# Patient Record
Sex: Female | Born: 1950 | Race: White | Hispanic: No | Marital: Single | Smoking: Former smoker
Health system: Southern US, Community
[De-identification: ages and names within clinical notes are randomized; demographics above are authoritative.]

## PROBLEM LIST (undated history)

## (undated) DIAGNOSIS — M549 Dorsalgia, unspecified: Secondary | ICD-10-CM

## (undated) DIAGNOSIS — E559 Vitamin D deficiency, unspecified: Secondary | ICD-10-CM

## (undated) DIAGNOSIS — C801 Malignant (primary) neoplasm, unspecified: Secondary | ICD-10-CM

## (undated) DIAGNOSIS — F329 Major depressive disorder, single episode, unspecified: Secondary | ICD-10-CM

## (undated) DIAGNOSIS — G8929 Other chronic pain: Secondary | ICD-10-CM

## (undated) DIAGNOSIS — Z9889 Other specified postprocedural states: Secondary | ICD-10-CM

## (undated) DIAGNOSIS — E041 Nontoxic single thyroid nodule: Secondary | ICD-10-CM

## (undated) DIAGNOSIS — R0602 Shortness of breath: Secondary | ICD-10-CM

## (undated) DIAGNOSIS — L570 Actinic keratosis: Secondary | ICD-10-CM

## (undated) DIAGNOSIS — C50919 Malignant neoplasm of unspecified site of unspecified female breast: Secondary | ICD-10-CM

## (undated) DIAGNOSIS — E039 Hypothyroidism, unspecified: Secondary | ICD-10-CM

## (undated) DIAGNOSIS — M2550 Pain in unspecified joint: Secondary | ICD-10-CM

## (undated) DIAGNOSIS — B019 Varicella without complication: Secondary | ICD-10-CM

## (undated) DIAGNOSIS — G43909 Migraine, unspecified, not intractable, without status migrainosus: Secondary | ICD-10-CM

## (undated) DIAGNOSIS — M1990 Unspecified osteoarthritis, unspecified site: Secondary | ICD-10-CM

## (undated) DIAGNOSIS — E049 Nontoxic goiter, unspecified: Secondary | ICD-10-CM

## (undated) DIAGNOSIS — J449 Chronic obstructive pulmonary disease, unspecified: Secondary | ICD-10-CM

## (undated) DIAGNOSIS — T8859XA Other complications of anesthesia, initial encounter: Secondary | ICD-10-CM

## (undated) DIAGNOSIS — F419 Anxiety disorder, unspecified: Secondary | ICD-10-CM

## (undated) DIAGNOSIS — R112 Nausea with vomiting, unspecified: Secondary | ICD-10-CM

## (undated) DIAGNOSIS — R251 Tremor, unspecified: Secondary | ICD-10-CM

## (undated) DIAGNOSIS — E739 Lactose intolerance, unspecified: Secondary | ICD-10-CM

## (undated) DIAGNOSIS — R0600 Dyspnea, unspecified: Secondary | ICD-10-CM

## (undated) DIAGNOSIS — F32A Depression, unspecified: Secondary | ICD-10-CM

## (undated) DIAGNOSIS — I10 Essential (primary) hypertension: Secondary | ICD-10-CM

## (undated) DIAGNOSIS — K219 Gastro-esophageal reflux disease without esophagitis: Secondary | ICD-10-CM

## (undated) DIAGNOSIS — E079 Disorder of thyroid, unspecified: Secondary | ICD-10-CM

## (undated) DIAGNOSIS — K829 Disease of gallbladder, unspecified: Secondary | ICD-10-CM

## (undated) HISTORY — DX: Tremor, unspecified: R25.1

## (undated) HISTORY — DX: Anxiety disorder, unspecified: F41.9

## (undated) HISTORY — DX: Vitamin D deficiency, unspecified: E55.9

## (undated) HISTORY — DX: Major depressive disorder, single episode, unspecified: F32.9

## (undated) HISTORY — PX: CATARACT EXTRACTION: SUR2

## (undated) HISTORY — DX: Lactose intolerance, unspecified: E73.9

## (undated) HISTORY — DX: Hypothyroidism, unspecified: E03.9

## (undated) HISTORY — DX: Migraine, unspecified, not intractable, without status migrainosus: G43.909

## (undated) HISTORY — PX: COLONOSCOPY: SHX174

## (undated) HISTORY — DX: Unspecified osteoarthritis, unspecified site: M19.90

## (undated) HISTORY — DX: Gastro-esophageal reflux disease without esophagitis: K21.9

## (undated) HISTORY — DX: Depression, unspecified: F32.A

## (undated) HISTORY — DX: Malignant (primary) neoplasm, unspecified: C80.1

## (undated) HISTORY — PX: TOE SURGERY: SHX1073

## (undated) HISTORY — DX: Disease of gallbladder, unspecified: K82.9

## (undated) HISTORY — DX: Chronic obstructive pulmonary disease, unspecified: J44.9

## (undated) HISTORY — PX: UPPER GI ENDOSCOPY: SHX6162

## (undated) HISTORY — DX: Varicella without complication: B01.9

## (undated) HISTORY — DX: Dorsalgia, unspecified: M54.9

## (undated) HISTORY — DX: Shortness of breath: R06.02

## (undated) HISTORY — DX: Other chronic pain: G89.29

## (undated) HISTORY — DX: Disorder of thyroid, unspecified: E07.9

## (undated) HISTORY — DX: Essential (primary) hypertension: I10

## (undated) HISTORY — DX: Actinic keratosis: L57.0

## (undated) HISTORY — DX: Pain in unspecified joint: M25.50

## (undated) HISTORY — PX: UPPER GASTROINTESTINAL ENDOSCOPY: SHX188

---

## 1976-08-09 HISTORY — PX: ABDOMINAL HYSTERECTOMY: SHX81

## 1998-08-09 HISTORY — PX: CHOLECYSTECTOMY: SHX55

## 2005-08-09 HISTORY — PX: COLONOSCOPY: SHX174

## 2007-08-10 DIAGNOSIS — C4492 Squamous cell carcinoma of skin, unspecified: Secondary | ICD-10-CM

## 2007-08-10 HISTORY — DX: Squamous cell carcinoma of skin, unspecified: C44.92

## 2015-09-15 DIAGNOSIS — N951 Menopausal and female climacteric states: Secondary | ICD-10-CM | POA: Diagnosis not present

## 2015-09-15 DIAGNOSIS — Z1211 Encounter for screening for malignant neoplasm of colon: Secondary | ICD-10-CM | POA: Diagnosis not present

## 2015-09-15 DIAGNOSIS — Z1231 Encounter for screening mammogram for malignant neoplasm of breast: Secondary | ICD-10-CM | POA: Diagnosis not present

## 2015-09-15 DIAGNOSIS — Z124 Encounter for screening for malignant neoplasm of cervix: Secondary | ICD-10-CM | POA: Diagnosis not present

## 2015-09-15 DIAGNOSIS — Z01419 Encounter for gynecological examination (general) (routine) without abnormal findings: Secondary | ICD-10-CM | POA: Diagnosis not present

## 2015-09-19 DIAGNOSIS — M791 Myalgia: Secondary | ICD-10-CM | POA: Diagnosis not present

## 2015-09-19 DIAGNOSIS — M2550 Pain in unspecified joint: Secondary | ICD-10-CM | POA: Diagnosis not present

## 2015-09-19 DIAGNOSIS — M19042 Primary osteoarthritis, left hand: Secondary | ICD-10-CM | POA: Diagnosis not present

## 2015-09-19 DIAGNOSIS — M47816 Spondylosis without myelopathy or radiculopathy, lumbar region: Secondary | ICD-10-CM | POA: Diagnosis not present

## 2015-09-19 DIAGNOSIS — M19041 Primary osteoarthritis, right hand: Secondary | ICD-10-CM | POA: Diagnosis not present

## 2015-09-19 DIAGNOSIS — M899 Disorder of bone, unspecified: Secondary | ICD-10-CM | POA: Diagnosis not present

## 2015-09-19 DIAGNOSIS — M7711 Lateral epicondylitis, right elbow: Secondary | ICD-10-CM | POA: Diagnosis not present

## 2015-09-19 DIAGNOSIS — M79604 Pain in right leg: Secondary | ICD-10-CM | POA: Diagnosis not present

## 2015-09-19 DIAGNOSIS — M949 Disorder of cartilage, unspecified: Secondary | ICD-10-CM | POA: Diagnosis not present

## 2015-09-19 DIAGNOSIS — M79605 Pain in left leg: Secondary | ICD-10-CM | POA: Diagnosis not present

## 2015-10-06 DIAGNOSIS — H43811 Vitreous degeneration, right eye: Secondary | ICD-10-CM | POA: Diagnosis not present

## 2015-10-06 DIAGNOSIS — H43812 Vitreous degeneration, left eye: Secondary | ICD-10-CM | POA: Diagnosis not present

## 2015-10-06 DIAGNOSIS — H2513 Age-related nuclear cataract, bilateral: Secondary | ICD-10-CM | POA: Diagnosis not present

## 2015-10-22 DIAGNOSIS — M79671 Pain in right foot: Secondary | ICD-10-CM | POA: Diagnosis not present

## 2015-10-22 DIAGNOSIS — M79672 Pain in left foot: Secondary | ICD-10-CM | POA: Diagnosis not present

## 2015-10-22 DIAGNOSIS — R899 Unspecified abnormal finding in specimens from other organs, systems and tissues: Secondary | ICD-10-CM | POA: Diagnosis not present

## 2015-10-22 DIAGNOSIS — M19049 Primary osteoarthritis, unspecified hand: Secondary | ICD-10-CM | POA: Diagnosis not present

## 2015-10-31 DIAGNOSIS — M79671 Pain in right foot: Secondary | ICD-10-CM | POA: Diagnosis not present

## 2015-10-31 DIAGNOSIS — S9031XA Contusion of right foot, initial encounter: Secondary | ICD-10-CM | POA: Diagnosis not present

## 2015-11-04 DIAGNOSIS — S9031XA Contusion of right foot, initial encounter: Secondary | ICD-10-CM | POA: Diagnosis not present

## 2015-11-04 DIAGNOSIS — M84374A Stress fracture, right foot, initial encounter for fracture: Secondary | ICD-10-CM | POA: Diagnosis not present

## 2015-11-04 DIAGNOSIS — R600 Localized edema: Secondary | ICD-10-CM | POA: Diagnosis not present

## 2015-11-06 DIAGNOSIS — M79671 Pain in right foot: Secondary | ICD-10-CM | POA: Diagnosis not present

## 2015-11-06 DIAGNOSIS — S92324A Nondisplaced fracture of second metatarsal bone, right foot, initial encounter for closed fracture: Secondary | ICD-10-CM | POA: Diagnosis not present

## 2015-11-11 DIAGNOSIS — S92324A Nondisplaced fracture of second metatarsal bone, right foot, initial encounter for closed fracture: Secondary | ICD-10-CM | POA: Diagnosis not present

## 2015-11-25 DIAGNOSIS — E049 Nontoxic goiter, unspecified: Secondary | ICD-10-CM | POA: Diagnosis not present

## 2015-11-25 DIAGNOSIS — E785 Hyperlipidemia, unspecified: Secondary | ICD-10-CM | POA: Diagnosis not present

## 2015-11-25 LAB — LIPID PANEL
Cholesterol: 205 mg/dL — AB (ref 0–200)
HDL: 48 mg/dL (ref 35–70)
LDL Cholesterol: 129 mg/dL
Triglycerides: 140 mg/dL (ref 40–160)

## 2015-11-25 LAB — HEPATIC FUNCTION PANEL
ALT: 36 U/L — AB (ref 7–35)
AST: 28 U/L (ref 13–35)
Alkaline Phosphatase: 56 U/L (ref 25–125)
Bilirubin, Total: 0.4 mg/dL

## 2015-11-25 LAB — BASIC METABOLIC PANEL
BUN: 11 mg/dL (ref 4–21)
CREATININE: 0.8 mg/dL (ref 0.5–1.1)
GLUCOSE: 107 mg/dL
Potassium: 4.2 mmol/L (ref 3.4–5.3)
SODIUM: 136 mmol/L — AB (ref 137–147)

## 2015-11-25 LAB — CBC AND DIFFERENTIAL
HEMATOCRIT: 41 % (ref 36–46)
HEMOGLOBIN: 13.7 g/dL (ref 12.0–16.0)
Neutrophils Absolute: 5031 /uL
Platelets: 264 10*3/uL (ref 150–399)
WBC: 7.6 10*3/mL

## 2015-11-27 DIAGNOSIS — Z08 Encounter for follow-up examination after completed treatment for malignant neoplasm: Secondary | ICD-10-CM | POA: Diagnosis not present

## 2015-11-27 DIAGNOSIS — D1801 Hemangioma of skin and subcutaneous tissue: Secondary | ICD-10-CM | POA: Diagnosis not present

## 2015-11-27 DIAGNOSIS — I788 Other diseases of capillaries: Secondary | ICD-10-CM | POA: Diagnosis not present

## 2015-11-27 DIAGNOSIS — L820 Inflamed seborrheic keratosis: Secondary | ICD-10-CM | POA: Diagnosis not present

## 2015-11-27 DIAGNOSIS — L570 Actinic keratosis: Secondary | ICD-10-CM | POA: Diagnosis not present

## 2015-11-27 DIAGNOSIS — L821 Other seborrheic keratosis: Secondary | ICD-10-CM | POA: Diagnosis not present

## 2015-11-27 DIAGNOSIS — L814 Other melanin hyperpigmentation: Secondary | ICD-10-CM | POA: Diagnosis not present

## 2015-11-27 DIAGNOSIS — L718 Other rosacea: Secondary | ICD-10-CM | POA: Diagnosis not present

## 2015-11-27 DIAGNOSIS — D485 Neoplasm of uncertain behavior of skin: Secondary | ICD-10-CM | POA: Diagnosis not present

## 2015-11-27 DIAGNOSIS — Z85828 Personal history of other malignant neoplasm of skin: Secondary | ICD-10-CM | POA: Diagnosis not present

## 2015-12-02 DIAGNOSIS — S92324D Nondisplaced fracture of second metatarsal bone, right foot, subsequent encounter for fracture with routine healing: Secondary | ICD-10-CM | POA: Diagnosis not present

## 2015-12-02 DIAGNOSIS — M2021 Hallux rigidus, right foot: Secondary | ICD-10-CM | POA: Diagnosis not present

## 2016-03-29 ENCOUNTER — Encounter: Payer: Self-pay | Admitting: Family Medicine

## 2016-03-29 DIAGNOSIS — Z85828 Personal history of other malignant neoplasm of skin: Secondary | ICD-10-CM | POA: Insufficient documentation

## 2016-03-29 DIAGNOSIS — M8580 Other specified disorders of bone density and structure, unspecified site: Secondary | ICD-10-CM | POA: Insufficient documentation

## 2016-03-29 DIAGNOSIS — G43009 Migraine without aura, not intractable, without status migrainosus: Secondary | ICD-10-CM | POA: Insufficient documentation

## 2016-03-29 DIAGNOSIS — M549 Dorsalgia, unspecified: Secondary | ICD-10-CM

## 2016-03-29 DIAGNOSIS — E785 Hyperlipidemia, unspecified: Secondary | ICD-10-CM | POA: Insufficient documentation

## 2016-03-29 DIAGNOSIS — G8929 Other chronic pain: Secondary | ICD-10-CM | POA: Insufficient documentation

## 2016-03-29 DIAGNOSIS — G4700 Insomnia, unspecified: Secondary | ICD-10-CM | POA: Insufficient documentation

## 2016-03-29 DIAGNOSIS — E049 Nontoxic goiter, unspecified: Secondary | ICD-10-CM | POA: Insufficient documentation

## 2016-03-29 DIAGNOSIS — F419 Anxiety disorder, unspecified: Secondary | ICD-10-CM | POA: Insufficient documentation

## 2016-03-29 DIAGNOSIS — M159 Polyosteoarthritis, unspecified: Secondary | ICD-10-CM | POA: Insufficient documentation

## 2016-03-30 ENCOUNTER — Ambulatory Visit (INDEPENDENT_AMBULATORY_CARE_PROVIDER_SITE_OTHER): Payer: Medicare Other | Admitting: Family Medicine

## 2016-03-30 ENCOUNTER — Encounter: Payer: Self-pay | Admitting: Family Medicine

## 2016-03-30 VITALS — BP 120/78 | HR 96 | Resp 12 | Ht 62.0 in | Wt 161.0 lb

## 2016-03-30 DIAGNOSIS — F419 Anxiety disorder, unspecified: Secondary | ICD-10-CM | POA: Diagnosis not present

## 2016-03-30 DIAGNOSIS — G8929 Other chronic pain: Secondary | ICD-10-CM

## 2016-03-30 DIAGNOSIS — E049 Nontoxic goiter, unspecified: Secondary | ICD-10-CM | POA: Diagnosis not present

## 2016-03-30 DIAGNOSIS — G4700 Insomnia, unspecified: Secondary | ICD-10-CM | POA: Diagnosis not present

## 2016-03-30 DIAGNOSIS — M2550 Pain in unspecified joint: Secondary | ICD-10-CM | POA: Diagnosis not present

## 2016-03-30 DIAGNOSIS — H8110 Benign paroxysmal vertigo, unspecified ear: Secondary | ICD-10-CM | POA: Diagnosis not present

## 2016-03-30 DIAGNOSIS — M549 Dorsalgia, unspecified: Secondary | ICD-10-CM

## 2016-03-30 DIAGNOSIS — K219 Gastro-esophageal reflux disease without esophagitis: Secondary | ICD-10-CM

## 2016-03-30 DIAGNOSIS — L259 Unspecified contact dermatitis, unspecified cause: Secondary | ICD-10-CM

## 2016-03-30 LAB — T4, FREE: FREE T4: 0.95 ng/dL (ref 0.60–1.60)

## 2016-03-30 LAB — BASIC METABOLIC PANEL
BUN: 15 mg/dL (ref 6–23)
CALCIUM: 8.9 mg/dL (ref 8.4–10.5)
CHLORIDE: 105 meq/L (ref 96–112)
CO2: 27 meq/L (ref 19–32)
Creatinine, Ser: 0.9 mg/dL (ref 0.40–1.20)
GFR: 66.62 mL/min (ref >60.00)
GLUCOSE: 94 mg/dL (ref 70–99)
POTASSIUM: 4.4 meq/L (ref 3.5–5.1)
SODIUM: 139 meq/L (ref 135–145)

## 2016-03-30 LAB — TSH: TSH: 1.68 u[IU]/mL (ref 0.35–4.50)

## 2016-03-30 MED ORDER — ALPRAZOLAM 0.25 MG PO TABS: 0.2500 mg | ORAL_TABLET | Freq: Every day | ORAL | 3 refills | Status: DC

## 2016-03-30 MED ORDER — TRIAMCINOLONE ACETONIDE 0.025 % EX CREA: 1.0000 "application " | TOPICAL_CREAM | Freq: Two times a day (BID) | CUTANEOUS | 1 refills | Status: DC

## 2016-03-30 MED ORDER — MECLIZINE HCL 12.5 MG PO TABS: 12.5000 mg | ORAL_TABLET | Freq: Three times a day (TID) | ORAL | 1 refills | Status: DC | PRN

## 2016-03-30 MED ORDER — CYCLOBENZAPRINE HCL 10 MG PO TABS: 10.0000 mg | ORAL_TABLET | Freq: Every day | ORAL | 1 refills | Status: DC

## 2016-03-30 MED ORDER — OMEPRAZOLE 20 MG PO CPDR: 20.0000 mg | DELAYED_RELEASE_CAPSULE | Freq: Every day | ORAL | 3 refills | Status: DC

## 2016-03-30 MED ORDER — DULOXETINE HCL 30 MG PO CPEP: 30.0000 mg | ORAL_CAPSULE | Freq: Every day | ORAL | 1 refills | Status: DC

## 2016-03-30 MED ORDER — DULOXETINE HCL 30 MG PO CPEP: 30.0000 mg | ORAL_CAPSULE | Freq: Every day | ORAL | 0 refills | Status: DC

## 2016-03-30 NOTE — Progress Notes (Signed)
Pre visit review using our clinic review tool, if applicable. No additional management support is needed unless otherwise documented below in the visit note. 

## 2016-03-30 NOTE — Progress Notes (Signed)
HPI:   [name] is a 64 y.o. female, who is here today to establish care with me.  Former PCP: in [place]. Last preventive routine visit: 11/2015.  She lives alone, she has friends in this area. Independent ADL's and IADL's. No falls in the past year and denies depression symptoms.   Concerns today: medications refill.  Hx of vertigo, according to pt, a few years ago she was evaluated by ENT and after some tests she was reassured and prescribed Meclizine. Has had episodes for the past few days, resolves 20-30 min after taking Meclizine.  Spinning sensation exacerbated by lying on back and turning on left side. She denies hearing loss. + Intermittent, chronic tinnitus, bilateral.  -She is currently on Estradiol patch to treat menopausal symptoms, hot flashes. Pruritic rash on patch site, just on right abdominal site. 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   Anxiety: She has been on Alprazolam 0.25 mg once daily for about 20 years. Medication helps with anxiety and insomnia. She sleeps about 7 hours. No side effects reported. Denies suicidal thoughts. + Hx of depression.  No Hx of psychiatric hospitalizations.  Rosacea on Finacea cream.Hx of skin cancer, BCC and SCC. She states that she does not need referral in order to establish with dermatologists.  Arthralgias: Hx of generalized OA and low back pain. She has seen rheumatologists, currently she is on Diclofenac 50 mg bid and Flexeril 10 mg daily as needed.  No LE numbness,tingling, or weakness. No saddle anesthesia or bowel/urine incontinence.  Thyroid disease/multinodular goiter:  Currently she is on Levothyroxine 12.5 mg daily.Accordign to pt, it was prescribed by endocrinologists for thyroid nodules even though TSH levels were "fine." According to pt, she is supposed to have thyroid u/s every 6  months. Reporting thyroid Bx done in the past.  Tolerating medication well, no side effects reported. She has not noted dysphagia, palpitations, abdominal pain, changes in bowel habits, tremor, cold/heat intolerance, or abnormal weight loss.  GERD: She is currently on Prilosec 20 mg daily. Helps with heartburn.  Denies abdominal pain, nausea, vomiting, changes in bowel habits, blood in stool or melena.  She also takes Triamterene-HCTZ daily as needed for LE edema.   Review of Systems  Constitutional: Negative for activity change, appetite change, fatigue, fever and unexpected weight change.  HENT: Positive for tinnitus (intermittently). Negative for dental problem, hearing loss, mouth sores, nosebleeds and trouble swallowing.   Eyes: Negative for redness and visual disturbance.  Respiratory: Negative for cough, shortness of breath and wheezing.   Cardiovascular: Positive for leg swelling (at baseline.). Negative for chest pain and palpitations.  Gastrointestinal: Negative for abdominal pain, nausea and vomiting.       Negative for changes in bowel habits.  Endocrine: Negative for cold intolerance and heat intolerance.  Genitourinary: Negative for decreased urine volume, difficulty urinating, hematuria, pelvic pain and vaginal bleeding.  Musculoskeletal: Positive for arthralgias and back pain. Negative for gait problem and joint swelling.  Skin: Negative for color change and rash.  Neurological: Positive for dizziness. Negative for seizures, syncope, facial asymmetry, weakness, numbness and headaches.  Psychiatric/Behavioral: Positive for sleep disturbance. Negative for confusion and suicidal ideas. The patient is nervous/anxious.       No current outpatient prescriptions on file prior to visit.   No current facility-administered medications on file prior to visit.  Past Medical History:  Diagnosis Date  . Anxiety   . Arthritis   . Cancer ([place])   . Chicken pox   .  Depression   . GERD (gastroesophageal reflux disease)   . Migraines   . Thyroid disease    Allergies  Allergen Reactions  . Sulfa Antibiotics Rash    Rash all over body/fever    Family History  Problem Relation Age of Onset  . Arthritis Mother   . Diabetes Mother   . Heart disease Father   . Stroke Father   . Hypertension Father   . Hyperlipidemia Father   . Diabetes Father     Social History   Social History  . Marital status: Single    Spouse name: N/A  . Number of children: N/A  . Years of education: N/A   Social History Main Topics  . Smoking status: Former Research scientist (life sciences)  . Smokeless tobacco: Never Used  . Alcohol use Yes     Comment: Occasional  . Drug use: No  . Sexual activity: No   Other Topics Concern  . None   Social History Narrative  . None    Vitals:   03/30/16 0848  BP: 120/78  Pulse: 96  Resp: 12    Body mass index is 29.45 kg/m.      Physical Exam  Nursing note and vitals reviewed. Constitutional: She is oriented to person, place, and time. She appears well-developed. No distress.  HENT:  Head: Atraumatic.  Mouth/Throat: Oropharynx is clear and moist and mucous membranes are normal.  Eyes: Conjunctivae and EOM are normal. Pupils are equal, round, and reactive to light.  Neck: No JVD present. Carotid bruit is not present. Thyromegaly present.  Cardiovascular: Normal rate and regular rhythm.   No murmur heard. Pulses:      Dorsalis pedis pulses are 2+ on the right side, and 2+ on the left side.  Respiratory: Effort normal and breath sounds normal. No respiratory distress.  GI: Soft. She exhibits no mass. There is no hepatomegaly. There is no tenderness.  Musculoskeletal: She exhibits edema (Pitting trace edema LE bilateral).  No tenderness upon palpation of paraspinal muscles. Pain elicited with movement on exam table during examination. Knee with mild pain with ROM, limited flexion, crepitus bilateral. No signs of synovitis or  significant deformities.   Lymphadenopathy:    She has no cervical adenopathy.  Neurological: She is alert and oriented to person, place, and time. She has normal strength. Coordination normal.  SLR negative bilateral. Stable gait with no assistance.  Skin: Skin is warm. Rash noted. There is erythema.     Under estradiol patch mild erythematous macular lesion, no local heat or tenderness.  Psychiatric: Her speech is normal. Her mood appears anxious. Cognition and memory are normal. She exhibits a depressed mood.  Well groomed, good eye contact.      ASSESSMENT AND PLAN:  Lab Results  Component Value Date   TSH 1.68 03/30/2016    Lab Results  Component Value Date   CREATININE 0.90 03/30/2016   BUN 15 03/30/2016   NA 139 03/30/2016   K 4.4 03/30/2016   CL 105 03/30/2016   CO2 27 03/30/2016     Alania was seen today for new patient (initial visit).  Diagnoses and all orders for this visit:  Polyarthralgia-OA   Stable. We discussed some side effects of NSAID's. I think she may benefit from Cymbalta for OA and back pain, she agrees with trying. Decrease Diclofenac from 1  tab bid to once daily. F/U in 6-8 weeks.   -     Basic Metabolic Panel -     Discontinue: DULoxetine (CYMBALTA) 30 MG capsule; Take 1 capsule (30 mg total) by mouth daily. -     DULoxetine (CYMBALTA) 30 MG capsule; Take 1 capsule (30 mg total) by mouth daily.  Goiter, non-toxic  No changes in current management, will follow labs done today and will give further recommendations accordingly.  -     TSH -     T4, Free  Insomnia, unspecified  Good sleep hygiene. No changes in Alprazolam. F/U in 4 months.  -     ALPRAZolam (XANAX) 0.25 MG tablet; Take 1 tablet (0.25 mg total) by mouth at bedtime.  Vertigo, benign positional, unspecified laterality  Some side effects of Meclizine discussed. Vestibular therapy at home can be done as needed,modified [place] maneuvers. Fall precautions. F/U as  needed.  -     Basic Metabolic Panel -     meclizine (ANTIVERT) 12.5 MG tablet; Take 1 tablet (12.5 mg total) by mouth 3 (three) times daily as needed for dizziness.  Anxiety disorder, unspecified  Stable. No changes in Alprazolam, she is requesting 3 months supply, since it is a controlled med + first visit I do not feel comfortable doing so.I may consider in the future. Cymbalta may also help.  F/U in 4 months.   -     ALPRAZolam (XANAX) 0.25 MG tablet; Take 1 tablet (0.25 mg total) by mouth at bedtime. -     DULoxetine (CYMBALTA) 30 MG capsule; Take 1 capsule (30 mg total) by mouth daily.  Back pain, chronic  Some side effects of Flexeril discussed. Cymbalta may help, start 30 mg daily and will plan on increasing it if well tolerated. F/U in 2 months.  -     cyclobenzaprine (FLEXERIL) 10 MG tablet; Take 1 tablet (10 mg total) by mouth daily. -     DULoxetine (CYMBALTA) 30 MG capsule; Take 1 capsule (30 mg total) by mouth daily.  Gastroesophageal reflux disease without esophagitis  Stable. No changes in current management. GERD precautions to continue. F/U in 6-12 months.  -     omeprazole (PRILOSEC) 20 MG capsule; Take 1 capsule (20 mg total) by mouth daily.  Contact dermatitis  Topical steroid cream recommended for up to 2 weeks. Consider applying patch on different areas. F/U as needed.  -     triamcinolone (KENALOG) 0.025 % cream; Apply 1 application topically 2 (two) times daily. For up to 14 days at the time.          Glenroy Crossen G. Martinique, MD  [facility]. [place] office.

## 2016-03-30 NOTE — Patient Instructions (Addendum)
A few things to remember from today's visit:   Goiter, non-toxic - Plan: TSH, T4, Free  Anxiety disorder, unspecified - Plan: DULoxetine (CYMBALTA) 30 MG capsule, ALPRAZolam (XANAX) 0.25 MG tablet  Insomnia, unspecified - Plan: ALPRAZolam (XANAX) 0.25 MG tablet  Polyarthralgia-OA - Plan: Basic Metabolic Panel, DULoxetine (CYMBALTA) 30 MG capsule  Back pain, chronic - Plan: DULoxetine (CYMBALTA) 30 MG capsule, cyclobenzaprine (FLEXERIL) 10 MG tablet  Gastroesophageal reflux disease without esophagitis - Plan: omeprazole (PRILOSEC) 20 MG capsule  Vertigo, benign positional, unspecified laterality - Plan: Basic Metabolic Panel, meclizine (ANTIVERT) 12.5 MG tablet  Cymbalta 30 mg daily added today, he might help with joint pain and back pain.  Medications like diclofenac can increase the risk of gastric bleeding, heart attacks, stroke, kidney disease. Some of the medications you were taking can increase the risk of falls.  -Please schedule appointment with gynecologist and practice in the area, I believe she will need referral for Neither one.   We have ordered labs or studies at this visit.  It can take up to 1-2 weeks for results and processing. IF results require follow up or explanation, we will call you with instructions. Clinically stable results will be released to your [facility]. If you have not heard from [place] or cannot find your results in [facility] in 2 weeks please contact our office at [phone].  If you are not yet signed up for [facility], please consider signing up  Please be sure medication list is accurate. If a new problem present, please set up appointment sooner than planned today.

## 2016-04-23 DIAGNOSIS — Z23 Encounter for immunization: Secondary | ICD-10-CM | POA: Diagnosis not present

## 2016-04-30 DIAGNOSIS — E559 Vitamin D deficiency, unspecified: Secondary | ICD-10-CM | POA: Diagnosis not present

## 2016-04-30 DIAGNOSIS — M84374A Stress fracture, right foot, initial encounter for fracture: Secondary | ICD-10-CM | POA: Diagnosis not present

## 2016-05-03 DIAGNOSIS — E559 Vitamin D deficiency, unspecified: Secondary | ICD-10-CM | POA: Diagnosis not present

## 2016-05-07 DIAGNOSIS — E559 Vitamin D deficiency, unspecified: Secondary | ICD-10-CM | POA: Diagnosis not present

## 2016-05-07 DIAGNOSIS — M84374D Stress fracture, right foot, subsequent encounter for fracture with routine healing: Secondary | ICD-10-CM | POA: Diagnosis not present

## 2016-05-11 DIAGNOSIS — L821 Other seborrheic keratosis: Secondary | ICD-10-CM | POA: Diagnosis not present

## 2016-05-11 DIAGNOSIS — Z85828 Personal history of other malignant neoplasm of skin: Secondary | ICD-10-CM | POA: Diagnosis not present

## 2016-05-11 DIAGNOSIS — L570 Actinic keratosis: Secondary | ICD-10-CM | POA: Diagnosis not present

## 2016-05-11 DIAGNOSIS — L814 Other melanin hyperpigmentation: Secondary | ICD-10-CM | POA: Diagnosis not present

## 2016-05-11 DIAGNOSIS — L820 Inflamed seborrheic keratosis: Secondary | ICD-10-CM | POA: Diagnosis not present

## 2016-05-11 DIAGNOSIS — D1801 Hemangioma of skin and subcutaneous tissue: Secondary | ICD-10-CM | POA: Diagnosis not present

## 2016-05-28 DIAGNOSIS — M84374D Stress fracture, right foot, subsequent encounter for fracture with routine healing: Secondary | ICD-10-CM | POA: Diagnosis not present

## 2016-06-03 ENCOUNTER — Encounter: Payer: Self-pay | Admitting: Family Medicine

## 2016-06-03 ENCOUNTER — Ambulatory Visit (INDEPENDENT_AMBULATORY_CARE_PROVIDER_SITE_OTHER): Payer: Medicare Other | Admitting: Family Medicine

## 2016-06-03 VITALS — BP 140/80 | HR 93 | Resp 12 | Ht 62.0 in | Wt 164.5 lb

## 2016-06-03 DIAGNOSIS — M2550 Pain in unspecified joint: Secondary | ICD-10-CM

## 2016-06-03 DIAGNOSIS — F418 Other specified anxiety disorders: Secondary | ICD-10-CM

## 2016-06-03 DIAGNOSIS — M549 Dorsalgia, unspecified: Secondary | ICD-10-CM

## 2016-06-03 DIAGNOSIS — G8929 Other chronic pain: Secondary | ICD-10-CM

## 2016-06-03 DIAGNOSIS — E049 Nontoxic goiter, unspecified: Secondary | ICD-10-CM | POA: Diagnosis not present

## 2016-06-03 MED ORDER — DULOXETINE HCL 60 MG PO CPEP: 60.0000 mg | ORAL_CAPSULE | Freq: Every day | ORAL | 2 refills | Status: DC

## 2016-06-03 NOTE — Progress Notes (Signed)
HPI:   Kristina [name] is a 65 y.o. female, who is here today to follow on arthralgias and anxiety, last OV 03/30/16.   Hx of generalized OA and low/upper back pain.  Pain is intermittent, mild to moderate, no limitation of ROM in general.  She has seen rheumatologists in the past. Currently she is on Diclofenac 50 mg bid and Flexeril 10 mg daily as needed but takes it almost every night. She denies new associated symptoms.  Pain seems to be worse in the morning, stiffness, improved after movement. IP joint pain also exacerbated by certain activities that involve frequent hand movement, she is right-handed.  No LE numbness,tingling, or weakness. No saddle anesthesia or bowel/urine incontinence.  Last office visit she agrees with starting Cymbalta 30 mg daily, she has tolerated well, denies any side effect. She states that she has not noticed any change in joint pain.  History of anxiety and depression, currently she is on Alprazolam 0.25 mg daily as needed, she usually takes it at bedtime and still helping with his sleep. Occasionally she wakes up in the middle of the night but she is able to go back to sleep.  She denies any worsening of insomnia or depression or after she started Cymbalta. She denies any suicidal thoughts.  Concerns today: Thyroid U/S  Requesting a thyroid ultrasound to follow on "nodules." She has history of goiter and according to patient, she had thyroid ultrasound around December 2016 and was recommended to follow up in a year. She denies odynophagia or dysphagia, last TSH in normal range August 2017.  Lab Results  Component Value Date   TSH 1.68 03/30/2016    Currently she is on Levothyroxine 12.5 g daily.   Review of Systems  Constitutional: Negative for activity change, appetite change, fatigue, fever and unexpected weight change.  HENT: Negative for mouth sores, nosebleeds, trouble swallowing and voice change.   Respiratory: Negative for  cough, shortness of breath, wheezing and stridor.   Cardiovascular: Negative for chest pain, palpitations and leg swelling.  Gastrointestinal: Negative for abdominal pain, nausea and vomiting.       Negative for changes in bowel habits.  Endocrine: Negative for cold intolerance and heat intolerance.  Genitourinary: Negative for difficulty urinating and hematuria.  Musculoskeletal: Positive for arthralgias and back pain. Negative for gait problem and joint swelling.  Neurological: Negative for syncope, weakness, numbness and headaches.  Psychiatric/Behavioral: Negative for confusion and suicidal ideas. The patient is nervous/anxious.       Current Outpatient Prescriptions on File Prior to Visit  Medication Sig Dispense Refill  . ALPRAZolam (XANAX) 0.25 MG tablet Take 1 tablet (0.25 mg total) by mouth at bedtime. 30 tablet 3  . Azelaic Acid (FINACEA) 15 % cream Apply 1 application topically 2 (two) times daily. After skin is thoroughly washed and patted dry, gently but thoroughly massage a thin film of azelaic acid cream into the affected area twice daily, in the morning and evening.    . cyclobenzaprine (FLEXERIL) 10 MG tablet Take 1 tablet (10 mg total) by mouth daily. 90 tablet 1  . diclofenac (VOLTAREN) 50 MG EC tablet Take 2 tablets by mouth daily.    Marland Kitchen estradiol (CLIMARA - DOSED IN MG/24 HR) 0.075 mg/24hr patch Place 1 patch onto the skin once a week.    . levothyroxine (SYNTHROID, LEVOTHROID) 25 MCG tablet Take 0.5 tablets by mouth daily.    . meclizine (ANTIVERT) 12.5 MG tablet Take 1 tablet (12.5 mg total)  by mouth 3 (three) times daily as needed for dizziness. 60 tablet 1  . omeprazole (PRILOSEC) 20 MG capsule Take 1 capsule (20 mg total) by mouth daily. 90 capsule 3  . triamcinolone (KENALOG) 0.025 % cream Apply 1 application topically 2 (two) times daily. For up to 14 days at the time. 30 g 1  . triamterene-hydrochlorothiazide (MAXZIDE-25) 37.5-25 MG tablet Take 1 tablet by mouth  daily.     No current facility-administered medications on file prior to visit.      Past Medical History:  Diagnosis Date  . Anxiety   . Arthritis   . Cancer ([place])   . Chicken pox   . Depression   . GERD (gastroesophageal reflux disease)   . Migraines   . Thyroid disease    Allergies  Allergen Reactions  . Sulfa Antibiotics Rash    Rash all over body/fever    Social History   Social History  . Marital status: Single    Spouse name: N/A  . Number of children: N/A  . Years of education: N/A   Social History Main Topics  . Smoking status: Former Research scientist (life sciences)  . Smokeless tobacco: Never Used  . Alcohol use Yes     Comment: Occasional  . Drug use: No  . Sexual activity: No   Other Topics Concern  . None   Social History Narrative  . None    Vitals:   06/03/16 1052  BP: 140/80  Pulse: 93  Resp: 12   Body mass index is 30.09 kg/m.    Physical Exam  Nursing note and vitals reviewed. Constitutional: She is oriented to person, place, and time. She appears well-developed. No distress.  HENT:  Head: Atraumatic.  Eyes: Conjunctivae and EOM are normal.  Neck: No tracheal tenderness present. Carotid bruit is not present. No tracheal deviation present. Thyromegaly present. No thyroid mass present.  Cardiovascular: Normal rate and regular rhythm.   No murmur heard. DP pulses present bilateral.  Respiratory: Effort normal and breath sounds normal. No respiratory distress.  Musculoskeletal: She exhibits no edema.  No tenderness upon palpation of paraspinal muscles: Cervical, thoracic, and lumbar. Shoulders normal ROM, bilateral, pain is not elicited. Knee no pain with movement, normal ROM,mild crepitus R. Wrist mild limited flexion, IP and MCP normal ROM. No signs of synovitis or significant deformities.   Neurological: She is alert and oriented to person, place, and time. She has normal strength. Coordination and gait normal.  SLR negative bilateral.   Skin:  Skin is warm. No rash noted. No erythema.  Psychiatric: Her speech is normal. Her mood appears anxious. Cognition and memory are normal. She does not exhibit a depressed mood.  Well groomed, good eye contact.      ASSESSMENT AND PLAN:     Geniah was seen today for follow-up.  Diagnoses and all orders for this visit:  Polyarthralgia-OA  Stable overall. She agrees with trying to increase dose of Cymbalta from 30 mg to 60 mg. If in about 8 weeks she does not notice benefit from medication she will let me know thorough my chart so we can start weaning off. Continue Diclofenac, we will review some side effects of medications.  Lab Results  Component Value Date   CREATININE 0.90 03/30/2016   BUN 15 03/30/2016   NA 139 03/30/2016   K 4.4 03/30/2016   CL 105 03/30/2016   CO2 27 03/30/2016    -     DULoxetine (CYMBALTA) 60 MG capsule; Take 1 capsule (  60 mg total) by mouth daily.  Goiter, non-toxic  I do not have report of last thyroid ultrasound. As requested, thyroid ultrasound will be scheduled for December 2017. No changes in levothyroxine dose, TSH can be repeated in August 2018.  -     US THYROID; Future  Chronic bilateral back pain, unspecified back location  Regular low impact exercise recommended. Cymbalta increased from 30 mg to 60 mg. She is not interested in changing Flexeril for another muscle relaxant, Baclofen to be considered later on if she still needs Flexeril daily. We discussed some risk of interaction between Cymbalta and Flexeril, recommended taking Cymbalta in the morning.  -     DULoxetine (CYMBALTA) 60 MG capsule; Take 1 capsule (60 mg total) by mouth daily.  Other specified anxiety disorders  Stable. Cymbalta increased may help. Instructed about warning signs. F/U in 4 months.      -Ms. Maija Susman was advised to return sooner than planned today if new concerns arise.       Betty G. Martinique, MD  [facility]. [place]  office.

## 2016-06-03 NOTE — Patient Instructions (Signed)
A few things to remember from today's visit:   Goiter, non-toxic - Plan: US THYROID  Polyarthralgia-OA - Plan: DULoxetine (CYMBALTA) 60 MG capsule  Chronic bilateral back pain, unspecified back location - Plan: DULoxetine (CYMBALTA) 60 MG capsule  Cymbalta increased to 60 mg, take it in the morning.  Consider changing Flexeril due to risk of interaction. Regular physical activity, low impact, Tai Chi is a good option. Please let me know thorough my chart how you're doing with Cymbalta in about 8 weeks. Please be sure medication list is accurate. If a new problem present, please set up appointment sooner than planned today.

## 2016-06-03 NOTE — Progress Notes (Signed)
Pre visit review using our clinic review tool, if applicable. No additional management support is needed unless otherwise documented below in the visit note. 

## 2016-06-10 ENCOUNTER — Ambulatory Visit
Admission: RE | Admit: 2016-06-10 | Discharge: 2016-06-10 | Disposition: A | Discharge status: Home / Self Care | Payer: Medicare Other | Source: Ambulatory Visit | Attending: Family Medicine | Admitting: Family Medicine

## 2016-06-10 DIAGNOSIS — E042 Nontoxic multinodular goiter: Secondary | ICD-10-CM | POA: Diagnosis not present

## 2016-06-10 DIAGNOSIS — E049 Nontoxic goiter, unspecified: Secondary | ICD-10-CM

## 2016-06-13 ENCOUNTER — Encounter: Payer: Self-pay | Admitting: Family Medicine

## 2016-07-05 DIAGNOSIS — J069 Acute upper respiratory infection, unspecified: Secondary | ICD-10-CM | POA: Diagnosis not present

## 2016-07-05 DIAGNOSIS — J040 Acute laryngitis: Secondary | ICD-10-CM | POA: Diagnosis not present

## 2016-07-05 DIAGNOSIS — R0981 Nasal congestion: Secondary | ICD-10-CM | POA: Diagnosis not present

## 2016-07-05 DIAGNOSIS — R05 Cough: Secondary | ICD-10-CM | POA: Diagnosis not present

## 2016-07-13 DIAGNOSIS — L570 Actinic keratosis: Secondary | ICD-10-CM | POA: Diagnosis not present

## 2016-08-13 DIAGNOSIS — L570 Actinic keratosis: Secondary | ICD-10-CM | POA: Diagnosis not present

## 2016-08-20 ENCOUNTER — Other Ambulatory Visit: Payer: Self-pay | Admitting: Family Medicine

## 2016-08-20 DIAGNOSIS — G8929 Other chronic pain: Secondary | ICD-10-CM

## 2016-08-20 DIAGNOSIS — M549 Dorsalgia, unspecified: Secondary | ICD-10-CM

## 2016-08-20 DIAGNOSIS — G4700 Insomnia, unspecified: Secondary | ICD-10-CM

## 2016-08-20 DIAGNOSIS — M2550 Pain in unspecified joint: Secondary | ICD-10-CM

## 2016-08-20 DIAGNOSIS — K219 Gastro-esophageal reflux disease without esophagitis: Secondary | ICD-10-CM

## 2016-08-20 MED ORDER — DICLOFENAC SODIUM 50 MG PO TBEC: 100.0000 mg | DELAYED_RELEASE_TABLET | Freq: Every day | ORAL | 1 refills | Status: DC

## 2016-08-20 MED ORDER — OMEPRAZOLE 20 MG PO CPDR
20.0000 mg | DELAYED_RELEASE_CAPSULE | Freq: Every day | ORAL | 1 refills | Status: DC
Start: 2016-08-20 — End: 2016-10-05

## 2016-08-20 MED ORDER — LEVOTHYROXINE SODIUM 25 MCG PO TABS: 12.5000 ug | ORAL_TABLET | Freq: Every day | ORAL | 1 refills | Status: DC

## 2016-08-20 MED ORDER — ALPRAZOLAM 0.25 MG PO TABS: 0.2500 mg | ORAL_TABLET | Freq: Every day | ORAL | 1 refills | Status: DC

## 2016-08-20 MED ORDER — CYCLOBENZAPRINE HCL 10 MG PO TABS: 10.0000 mg | ORAL_TABLET | Freq: Every day | ORAL | 1 refills | Status: DC

## 2016-08-20 MED ORDER — DULOXETINE HCL 60 MG PO CPEP: 60.0000 mg | ORAL_CAPSULE | Freq: Every day | ORAL | 1 refills | Status: DC

## 2016-08-20 NOTE — Telephone Encounter (Signed)
Spoke to pt, told her all Rx's were sent to [facility] as requested. Pt verbalized understanding.

## 2016-08-20 NOTE — Telephone Encounter (Signed)
Pt needs new rxs send to [facility] order pharm. Pt needs alprazolam, duloxetine,cyclobenzaprine, diclofenac,levothyroxine and omeprazole #90 w/refills

## 2016-08-30 ENCOUNTER — Telehealth: Payer: Self-pay | Admitting: Family Medicine

## 2016-08-30 NOTE — Telephone Encounter (Signed)
Rx re-faxed.

## 2016-08-30 NOTE — Telephone Encounter (Signed)
Pt stated that [facility] stated they did not receive the Rx for alprazolam can you resend this?

## 2016-09-16 DIAGNOSIS — Z8619 Personal history of other infectious and parasitic diseases: Secondary | ICD-10-CM | POA: Diagnosis not present

## 2016-09-16 DIAGNOSIS — Z01411 Encounter for gynecological examination (general) (routine) with abnormal findings: Secondary | ICD-10-CM | POA: Diagnosis not present

## 2016-09-16 DIAGNOSIS — N952 Postmenopausal atrophic vaginitis: Secondary | ICD-10-CM | POA: Diagnosis not present

## 2016-09-16 DIAGNOSIS — Z90710 Acquired absence of both cervix and uterus: Secondary | ICD-10-CM | POA: Diagnosis not present

## 2016-09-16 DIAGNOSIS — N393 Stress incontinence (female) (male): Secondary | ICD-10-CM | POA: Diagnosis not present

## 2016-09-16 DIAGNOSIS — Z79890 Hormone replacement therapy (postmenopausal): Secondary | ICD-10-CM | POA: Diagnosis not present

## 2016-09-20 ENCOUNTER — Other Ambulatory Visit: Payer: Self-pay | Admitting: Obstetrics & Gynecology

## 2016-09-20 DIAGNOSIS — Z1231 Encounter for screening mammogram for malignant neoplasm of breast: Secondary | ICD-10-CM

## 2016-09-30 ENCOUNTER — Ambulatory Visit
Admission: RE | Admit: 2016-09-30 | Discharge: 2016-09-30 | Disposition: A | Discharge status: Home / Self Care | Payer: Medicare Other | Source: Ambulatory Visit | Attending: Obstetrics & Gynecology | Admitting: Obstetrics & Gynecology

## 2016-09-30 DIAGNOSIS — Z1231 Encounter for screening mammogram for malignant neoplasm of breast: Secondary | ICD-10-CM | POA: Diagnosis not present

## 2016-10-04 NOTE — Progress Notes (Signed)
HPI:   Kristina [name] is a 66 y.o. female, who is here today to follow on some chronic medical problems.  Last seen on 06/03/17.  Generalized osteoarthritis and chronic lower and upper back pain. She is currently on Cymbalta, increased to 60 mg last OV. She is also on Diclofenac 50 mg bid and Flexeril. She has tolerated medication well.  She does not feel like Cymbalta is helping with pain and for the contrary she feels like joint pain is worse: IP and MCP both hands mainly. Shoulders stiffness, no limitation of ROM. Lower back pain, achy,radiated to LLE mainly. Tingling on lateral aspect of distal extremity,noticeable when she rubs her fingers on area. She tells me that she has had this symptoms for a while now and seems to be stable. She states that pain during the day is tolerable but at night can be "intense", interfering with her sleep. No recent trauma. She had epidural injections before and would like to discuss the possibility of having procedure.   She denies saddle anesthesia, urine/bowel incontinence, lower extremity weakness. Cervical pain stable.  Lab Results  Component Value Date   CREATININE 0.90 03/30/2016   BUN 15 03/30/2016   NA 139 03/30/2016   K 4.4 03/30/2016   CL 105 03/30/2016   CO2 27 03/30/2016   She has followed with rheumatologists ion the past.   Anxiety and insomnia:  She is on Alprazolam 0.25 mg at bedtime, helps her sleep. She denies suicidal thoughts or depressed mood.  No major difference in mood with Cymbalta.  Wakes up around 4 am because left hip achy and leg pain, cannot go back to sleep.    Concerns today: Dry cough for 2-3 months.  Symptoms started when she was in [place] in 06/2016 and after URI. She is using nasal saline. Intranasal steroid causes nose bleed,so not using it. Worse at night, wakes her up. She denies associated fever,wheezing,chest pain,dyspnea,or abnormal wt loss.   Hx of GERD, she takes  Omeprazole 20 mg right after dinner. + Heartburn.  Denies abdominal pain, nausea, vomiting, changes in bowel habits, blood in stool or melena.  + Post nasal drainage.  Palpitations: She has had it for a while but for the past months has been worse, sudden "heavy heart bit" and feels like she has to take a deep breath to alleviate discomfort. It happens at rest,usually 2-3 times per day but no daily;it lasts seconds.  No associated chest pain, dyspnea,diaphoresis,or dizziness.  She has not identified exacerbating or alleviating factors for palpitations.    "Shaking" hands noted for about a month ago or so, she is reporting it as new. Exacerbated by fine hand movement: witting or picking something with thumb and index finger. R>L.  She is not sure if tremor is going for longer and just noted it because getting worse. Not aware of FHx of tremor. Right handed. She states that the "bottom jaw" also shakes, this she thought was related to "crunshing" her teeth at night. States that it is usually noted during dentists visits. She tells me that she has "always" have it when she opens mouth for long time. She is concerned about Parkinson, no FHx.  Hx of vertigo, had an episode recently.She is taking Meclizine and takes Maxzide.  She also mentions that she is having hot flashes, trying to stop hormonal therapy, she is on estradiol patch. She follows with gyn.   Review of Systems  Constitutional: Positive for fatigue. Negative for activity  change, appetite change and unexpected weight change.  HENT: Positive for postnasal drip. Negative for mouth sores, sore throat and voice change.   Eyes: Negative for redness and visual disturbance.  Respiratory: Positive for cough. Negative for chest tightness, shortness of breath, wheezing and stridor.   Cardiovascular: Positive for palpitations. Negative for chest pain and leg swelling.  Gastrointestinal: Negative for abdominal pain, nausea and vomiting.         Negative for changes in bowel habits.  Genitourinary: Negative for decreased urine volume, difficulty urinating and hematuria.  Musculoskeletal: Positive for arthralgias, back pain and neck pain. Negative for gait problem.  Skin: Negative for rash.  Allergic/Immunologic: Positive for environmental allergies.  Neurological: Positive for dizziness, tremors and numbness. Negative for syncope, weakness and headaches.  Psychiatric/Behavioral: Positive for sleep disturbance. Negative for confusion, hallucinations and suicidal ideas. The patient is nervous/anxious.       Current Outpatient Prescriptions on File Prior to Visit  Medication Sig Dispense Refill  . ALPRAZolam (XANAX) 0.25 MG tablet Take 1 tablet (0.25 mg total) by mouth at bedtime. 90 tablet 1  . Azelaic Acid (FINACEA) 15 % cream Apply 1 application topically 2 (two) times daily. After skin is thoroughly washed and patted dry, gently but thoroughly massage a thin film of azelaic acid cream into the affected area twice daily, in the morning and evening.    . cyclobenzaprine (FLEXERIL) 10 MG tablet Take 1 tablet (10 mg total) by mouth daily. 90 tablet 1  . diclofenac (VOLTAREN) 50 MG EC tablet Take 2 tablets (100 mg total) by mouth daily. 180 tablet 1  . estradiol (CLIMARA - DOSED IN MG/24 HR) 0.075 mg/24hr patch Place 1 patch onto the skin once a week.    . levothyroxine (SYNTHROID, LEVOTHROID) 25 MCG tablet Take 0.5 tablets (12.5 mcg total) by mouth daily. 45 tablet 1  . meclizine (ANTIVERT) 12.5 MG tablet Take 1 tablet (12.5 mg total) by mouth 3 (three) times daily as needed for dizziness. 60 tablet 1   No current facility-administered medications on file prior to visit.      Past Medical History:  Diagnosis Date  . Anxiety   . Arthritis   . Cancer ([place])   . Chicken pox   . Depression   . GERD (gastroesophageal reflux disease)   . Migraines   . Thyroid disease    Allergies  Allergen Reactions  . Sulfa Antibiotics  Rash    Rash all over body/fever   Family History  Problem Relation Age of Onset  . Arthritis Mother   . Diabetes Mother   . Heart disease Father   . Stroke Father   . Hypertension Father   . Hyperlipidemia Father   . Diabetes Father   . Breast cancer Maternal Aunt     Social History   Social History  . Marital status: Single    Spouse name: N/A  . Number of children: N/A  . Years of education: N/A   Social History Main Topics  . Smoking status: Former Research scientist (life sciences)  . Smokeless tobacco: Never Used  . Alcohol use Yes     Comment: Occasional  . Drug use: No  . Sexual activity: No   Other Topics Concern  . None   Social History Narrative  . None    Vitals:   10/05/16 0825  BP: 140/80  Pulse: 84  Resp: 12  O2 sat 98% at RA. Body mass index is 30.45 kg/m.   Physical Exam  Nursing note and  vitals reviewed. Constitutional: She is oriented to person, place, and time. She appears well-developed. No distress.  HENT:  Head: Atraumatic.  Eyes: Conjunctivae and EOM are normal. Pupils are equal, round, and reactive to light.  Neck: Neck supple.  Cardiovascular: Normal rate and regular rhythm.   No murmur heard. DP pulses present bilateral.  Respiratory: Effort normal and breath sounds normal. No respiratory distress.  GI: Soft. She exhibits no mass. There is no hepatomegaly. There is no tenderness.  Musculoskeletal: She exhibits no edema.  + Tenderness upon palpation of paraspinal muscles: Left lumbar and lower thoracic. + Muscle spasm bilateral.  Knee no pain with movement, normal ROM,mild crepitus bilateral.Hip flexion does not elicit pain, mildly decreased,bilateral. Wrist mild limited flexion, IP and MCP normal ROM. No signs of synovitis.   Lymphadenopathy:    She has no cervical adenopathy.  Neurological: She is alert and oriented to person, place, and time. She has normal strength. She displays tremor (head and hands with intention). No cranial nerve deficit.  Gait normal.  SLR negative bilateral. Romberg test:initially she tilted toward left, able to balance in a few seconds ? Cogwheel. Pronator drift negative otherwise, right hand rotated about 30 degree.   Skin: Skin is warm. No rash noted. No erythema.  Psychiatric: Her speech is normal. Her mood appears anxious. Her affect is labile. Cognition and memory are normal. She expresses no suicidal ideation.  Well groomed, good eye contact.      ASSESSMENT AND PLAN:   Lizandra was seen today for follow-up.  Diagnoses and all orders for this visit:  Polyarthralgia-OA  OA, we discussed some side effects of Diclofenac, including GI and CVD. Cymbalta did not help,so discontinued. Mobic caused skin irritation in the past,so she is afraid of trying Celebrex.  Chronic bilateral back pain, unspecified back location  She agrees with trying Gabapentin at bedtime, some side effects discussed. Ortho referral placed.  -     Ambulatory referral to Orthopedic Surgery -     Discontinue: gabapentin (NEURONTIN) 300 MG capsule; Take 1 capsule (300 mg total) by mouth at bedtime. -     gabapentin (NEURONTIN) 300 MG capsule; Take 1 capsule (300 mg total) by mouth at bedtime.  Other specified anxiety disorders  + Depression. Cymbalta did not help, so discontinued.Effexor added. Explained that usually well tolerated changing from Cymbalta to Effexor but if withdrawal like symptoms, we may nee to wean off Cymbalta first. Instructed about warning signs. No changes in Alprazolam. F/U in 6 weeks.  -     venlafaxine XR (EFFEXOR XR) 75 MG 24 hr capsule; Take 1 capsule (75 mg total) by mouth daily with breakfast.  Gastroesophageal reflux disease without esophagitis  GERD precautions discussed. Could be causing cough. Increase Omeprazole from 20 to 40 mg daily and 30 min before or 3 hours after a meal. F/U in 6 weeks.   -     omeprazole (PRILOSEC) 40 MG capsule; Take 1 capsule (40 mg total) by mouth  daily.  Tremor of unknown origin  Possible etiologies discussed. Because on examination I am not sure about mild abnormalities (cogwheel,romberg,pronator drift),neurology referral placed. Instructed about warning signs.  -     Ambulatory referral to Neurology  Cough  ? Allergies,GERD,residual symptoms after URI among some,COPD. CXR ordered. PPI increased. F/U in 6 weeks.  -     DG Chest 2 View; Future  Heart palpitations  EKG done today: SR, mild short PR interval,normal axis. No other EKG available for comparison. Instructed about  warning signs. F/U in 6 weeks.  -     EKG 12-Lead -     Basic metabolic panel  Hot flashes, menopausal  Effexor may help. Continue following with gyn.     Face to face: 44 min. She has several concerns today, I am not sure if all were addressed today,she was going through a list writing on paper. > 50% of visit was dedicated to discussion of possible etiologies for some of her concerns,prognosis of others,medication side effects, and plan of care.    -Ms. Kymberlee Machovec was advised to return sooner than planned today if new concerns arise.       Cing [place] G. Martinique, MD  [facility] [place] office.

## 2016-10-05 ENCOUNTER — Telehealth: Payer: Self-pay

## 2016-10-05 ENCOUNTER — Encounter: Payer: Self-pay | Admitting: Family Medicine

## 2016-10-05 ENCOUNTER — Ambulatory Visit (INDEPENDENT_AMBULATORY_CARE_PROVIDER_SITE_OTHER): Payer: Medicare Other | Admitting: Family Medicine

## 2016-10-05 VITALS — BP 140/80 | HR 84 | Resp 12 | Ht 62.0 in | Wt 166.5 lb

## 2016-10-05 DIAGNOSIS — F418 Other specified anxiety disorders: Secondary | ICD-10-CM

## 2016-10-05 DIAGNOSIS — G8929 Other chronic pain: Secondary | ICD-10-CM | POA: Diagnosis not present

## 2016-10-05 DIAGNOSIS — M549 Dorsalgia, unspecified: Secondary | ICD-10-CM | POA: Diagnosis not present

## 2016-10-05 DIAGNOSIS — N951 Menopausal and female climacteric states: Secondary | ICD-10-CM

## 2016-10-05 DIAGNOSIS — M2550 Pain in unspecified joint: Secondary | ICD-10-CM | POA: Diagnosis not present

## 2016-10-05 DIAGNOSIS — R251 Tremor, unspecified: Secondary | ICD-10-CM | POA: Diagnosis not present

## 2016-10-05 DIAGNOSIS — K219 Gastro-esophageal reflux disease without esophagitis: Secondary | ICD-10-CM | POA: Diagnosis not present

## 2016-10-05 DIAGNOSIS — R002 Palpitations: Secondary | ICD-10-CM | POA: Diagnosis not present

## 2016-10-05 DIAGNOSIS — R059 Cough, unspecified: Secondary | ICD-10-CM

## 2016-10-05 DIAGNOSIS — R05 Cough: Secondary | ICD-10-CM

## 2016-10-05 LAB — BASIC METABOLIC PANEL
BUN: 17 mg/dL (ref 6–23)
CALCIUM: 8.9 mg/dL (ref 8.4–10.5)
CO2: 30 meq/L (ref 19–32)
Chloride: 104 mEq/L (ref 96–112)
Creatinine, Ser: 0.87 mg/dL (ref 0.40–1.20)
GFR: 69.16 mL/min (ref >60.00)
Glucose, Bld: 91 mg/dL (ref 70–99)
Potassium: 4 mEq/L (ref 3.5–5.1)
Sodium: 139 mEq/L (ref 135–145)

## 2016-10-05 MED ORDER — GABAPENTIN 300 MG PO CAPS: 300.0000 mg | ORAL_CAPSULE | Freq: Every day | ORAL | 3 refills | Status: DC

## 2016-10-05 MED ORDER — OMEPRAZOLE 40 MG PO CPDR: 40.0000 mg | DELAYED_RELEASE_CAPSULE | Freq: Every day | ORAL | 3 refills | Status: DC

## 2016-10-05 MED ORDER — VENLAFAXINE HCL ER 75 MG PO CP24: 75.0000 mg | ORAL_CAPSULE | Freq: Every day | ORAL | 2 refills | Status: DC

## 2016-10-05 NOTE — Patient Instructions (Addendum)
A few things to remember from today's visit:   Polyarthralgia-OA  Chronic bilateral back pain, unspecified back location - Plan: Ambulatory referral to Orthopedic Surgery, gabapentin (NEURONTIN) 300 MG capsule  Other specified anxiety disorders - Plan: venlafaxine XR (EFFEXOR XR) 75 MG 24 hr capsule  Gastroesophageal reflux disease without esophagitis  Tremor of unknown origin - Plan: Ambulatory referral to Neurology  Cough - Plan: DG Chest 2 View  Heart palpitations - Plan: EKG 12-Lead  Hot flashes, menopausal   Effexor to start and Cymbalta to stop. In 2 weeks start Gabapentin for left leg pain. Omeprazole increased to 20 mg.    Avoid foods that make your symptoms worse, for example coffee, chocolate,pepermeint,alcohol, and greasy food. Raising the head of your bed about 6 inches may help with nocturnal symptoms.  Avoid tobacco use. Weight loss (if you are overweight). Avoid lying down for 3 hours after eating.  Instead 3 large meals daily try small and more frequent meals during the day.  Every medication have side effects and medications for GERD are not the exception.At this time I think benefit is greater than risk.  There has been some concerns about dementia and medications like Omeprazole or Nexium (PPI) but recent studies do not show a relation. Also kidney function can be affected among some patients that take these type of medications, we will follow accordingly. Taking these medications for long term could increase risk of osteoporosis (some debate now), vitamin deficiencies (Vit D and B12 specialty), increases risk of pneumonia.  You should be evaluated immediately if bloody vomiting, bloody stools, black stools (like tar), difficulty swallowing, food gets stuck on the way down or choking when eating. Abnormal weight loss or severe abdominal pain.  If symptoms are not resolved sometimes endoscopy is necessary.  Please be sure medication list is accurate. If  a new problem present, please set up appointment sooner than planned today.

## 2016-10-05 NOTE — Telephone Encounter (Signed)
Received PA request from [facility] for Venlafaxine er 75 mg capsules. PA submitted & is pending. Key: [id number]

## 2016-10-05 NOTE — Progress Notes (Signed)
Pre visit review using our clinic review tool, if applicable. No additional management support is needed unless otherwise documented below in the visit note. 

## 2016-10-06 NOTE — Telephone Encounter (Signed)
PA Approved, form faxed back to pharmacy. 

## 2016-10-07 ENCOUNTER — Ambulatory Visit (INDEPENDENT_AMBULATORY_CARE_PROVIDER_SITE_OTHER)
Admission: RE | Admit: 2016-10-07 | Discharge: 2016-10-07 | Disposition: A | Discharge status: Home / Self Care | Payer: Medicare Other | Source: Ambulatory Visit | Attending: Family Medicine | Admitting: Family Medicine

## 2016-10-07 ENCOUNTER — Encounter: Payer: Self-pay | Admitting: Family Medicine

## 2016-10-07 DIAGNOSIS — R05 Cough: Secondary | ICD-10-CM

## 2016-10-07 DIAGNOSIS — R059 Cough, unspecified: Secondary | ICD-10-CM

## 2016-10-19 ENCOUNTER — Telehealth: Payer: Self-pay | Admitting: Family Medicine

## 2016-10-19 NOTE — Telephone Encounter (Signed)
Pt states she has had vertigo since Friday.  Pt is already prescripted  meclizine (ANTIVERT) 12.5 MG tablet  Pt states it has been going on since last Friday, and has never lasted this long. Would like to know if yo want to refer her to an ENT of have her come in?  Pt states she also has a feeling of "hot flashes" with this and clammy.  Pt has neuro appt on 3/26

## 2016-10-20 ENCOUNTER — Other Ambulatory Visit: Payer: Self-pay | Admitting: Family Medicine

## 2016-10-20 DIAGNOSIS — R42 Dizziness and giddiness: Secondary | ICD-10-CM

## 2016-10-20 NOTE — Telephone Encounter (Signed)
Noted  

## 2016-10-20 NOTE — Telephone Encounter (Signed)
Left voicemail letting patient know referral was placed & to call back with any questions.

## 2016-10-20 NOTE — Telephone Encounter (Signed)
Referral to ENT placed. If symptoms get worse,or if sudden hearing loss,chest pain,associated palpitation,dyspnea or other worrisome/new associated symptom she needs to be evaluated;otherwise she can wait for ENT evaluation.  Thanks, BJ

## 2016-10-20 NOTE — Telephone Encounter (Signed)
Pt states she no longer needs this referral to the ENT. Pt states she did not realize she needed to "ween" herself off   of the DULoxetine (CYMBALTA) 60 MG capsule   Pt had quit completely cold [place].  Pt realized this, took one yesterday, and today feels fine today. Pt states she is still going to dc this med, but is going to do it gradually, probably one every other day.  If pt has any other issues, she will let us know.  Sent message to Hilda Blades to disregard request for ENT referral.

## 2016-10-29 NOTE — Progress Notes (Signed)
Kristina [name] was seen today in the movement disorders clinic for neurologic consultation at the request of [name], MD.  The consultation is for the evaluation of tremor.   Tremor: Yes.     How long has it been going on? 10 years but intermittent then but persistent x years  At rest or with activation?  With use   When is it noted the most?  Fine motor coordination  Fam hx of tremor?  No.  Located where?  R noted more but both hands probably equal  Affected by caffeine:  Doesn't notice (1 cup tea)  Affected by alcohol:  Not sure  Affected by stress:  unsure  Affected by fatigue:  No.  Spills soup if on spoon:  No. but will notice it  Spills glass of liquid if full:  No. but will note it  Affects ADL's (tying shoes, brushing teeth, etc):  No.   OtherSpecific Symptoms:  Voice: no change Sleep: sleeps well with xanax and flexeril and diclofenac  Vivid Dreams:  Yes.    Acting out dreams:  Yes.   (will reach out in the middle of the night but not bad) Wet Pillows: No. Postural symptoms:  No.  Falls?  No. Bradykinesia symptoms: minimal trouble getting up due to back pain Loss of smell:  No. Loss of taste:  No. Urinary Incontinence:  No. Difficulty Swallowing:  Mild (always had trouble swallowing pills) Handwriting, micrographia: No. Trouble with ADL's:  No.  Trouble buttoning clothing: No. Depression:  No. Memory changes:  Yes.   N/V:  No. Lightheaded:  Yes.   (was taking meclizine but "I have a problem with my ear")  Syncope: No. Diplopia:  No. Dyskinesia:  No.  Neuroimaging has not previously been performed in the recent years.  Believes she had one "for the ear" 10 years ago  PREVIOUS MEDICATIONS: none to date  ALLERGIES:   Allergies  Allergen Reactions  . Meloxicam   . Sulfa Antibiotics Rash    Rash all over body/fever    CURRENT MEDICATIONS:  Outpatient Encounter Prescriptions as of 11/01/2016  Medication Sig  . ALPRAZolam (XANAX) 0.25 MG tablet Take  1 tablet (0.25 mg total) by mouth at bedtime.  . Azelaic Acid (FINACEA) 15 % cream Apply 1 application topically 2 (two) times daily. After skin is thoroughly washed and patted dry, gently but thoroughly massage a thin film of azelaic acid cream into the affected area twice daily, in the morning and evening.  . cyclobenzaprine (FLEXERIL) 10 MG tablet Take 1 tablet (10 mg total) by mouth daily.  . diclofenac (VOLTAREN) 50 MG EC tablet Take 2 tablets (100 mg total) by mouth daily.  . DULoxetine (CYMBALTA) 60 MG capsule Take 60 mg by mouth daily.  Marland Kitchen estradiol (CLIMARA - DOSED IN MG/24 HR) 0.075 mg/24hr patch Place 1 patch onto the skin once a week.  . levothyroxine (SYNTHROID, LEVOTHROID) 25 MCG tablet Take 0.5 tablets (12.5 mcg total) by mouth daily.  . Magnesium 250 MG TABS Take by mouth daily.  . meclizine (ANTIVERT) 12.5 MG tablet Take 1 tablet (12.5 mg total) by mouth 3 (three) times daily as needed for dizziness.  . Multiple Vitamin (MULTIVITAMIN) tablet Take 1 tablet by mouth daily.  Marland Kitchen omeprazole (PRILOSEC) 40 MG capsule Take 1 capsule (40 mg total) by mouth daily.  . vitamin B-12 (CYANOCOBALAMIN) 1000 MCG tablet Take 1,000 mcg by mouth once a week.  . [DISCONTINUED] gabapentin (NEURONTIN) 300 MG capsule Take 1 capsule (  300 mg total) by mouth at bedtime.  . [DISCONTINUED] venlafaxine XR (EFFEXOR XR) 75 MG 24 hr capsule Take 1 capsule (75 mg total) by mouth daily with breakfast.   No facility-administered encounter medications on file as of 11/01/2016.     PAST MEDICAL HISTORY:   Past Medical History:  Diagnosis Date  . Anxiety   . Arthritis   . Cancer ([place])   . Chicken pox   . Depression   . GERD (gastroesophageal reflux disease)   . Migraines   . Thyroid disease     PAST SURGICAL HISTORY:   Past Surgical History:  Procedure Laterality Date  . ABDOMINAL HYSTERECTOMY    . CHOLECYSTECTOMY      SOCIAL HISTORY:   Social History   Social History  . Marital status: Single     Spouse name: N/A  . Number of children: N/A  . Years of education: N/A   Occupational History  . Not on file.   Social History Main Topics  . Smoking status: Former Smoker    Quit date: 11/02/1990  . Smokeless tobacco: Never Used  . Alcohol use Yes     Comment: glass of wine once a month  . Drug use: No  . Sexual activity: No   Other Topics Concern  . Not on file   Social History Narrative  . No narrative on file    FAMILY HISTORY:   Family Status  Relation Status  . Mother Deceased  . Father Deceased  . Maternal Aunt   . Sister Alive  . Brother Alive    ROS:  A complete 10 system review of systems was obtained and was unremarkable apart from what is mentioned above.  PHYSICAL EXAMINATION:    VITALS:   Vitals:   11/01/16 0859  BP: 128/80  Pulse: (!) 112  Weight: 166 lb (75.3 kg)  Height: 5\' 2"  (1.575 m)    GEN:  The patient appears stated age and is in NAD. HEENT:  Normocephalic, atraumatic.  The mucous membranes are moist. The superficial temporal arteries are without ropiness or tenderness. CV:  RRR Lungs:  CTAB Neck/HEME:  There are no carotid bruits bilaterally.  Neurological examination:  Orientation: The patient is alert and oriented x3. Fund of knowledge is appropriate.  Recent and remote memory are intact.  Attention and concentration are normal.    Able to name objects and repeat phrases. Cranial nerves: There is good facial symmetry. The L pupil is slightly irregular and reactive.  The right is regular and reactive.   Fundoscopic exam reveals clear margins bilaterally. Extraocular muscles are intact. The visual fields are full to confrontational testing. The speech is fluent and clear. Soft palate rises symmetrically and there is no tongue deviation. Hearing is intact to conversational tone. Sensation: Sensation is intact to light and pinprick throughout (facial, trunk, extremities). Vibration is intact at the bilateral big toe. There is no extinction  with double simultaneous stimulation. There is no sensory dermatomal level identified. Motor: Strength is 5/5 in the bilateral upper and lower extremities.   Shoulder shrug is equal and symmetric.  There is no pronator drift. Deep tendon reflexes: Deep tendon reflexes are 2/4 at the bilateral biceps, triceps, brachioradialis, patella and achilles. Plantar responses are downgoing bilaterally.  Movement examination: Tone: There is normal tone in the bilateral upper extremities.  The tone in the lower extremities is normal.  Abnormal movements: she has minimal difficulty with archimedes spirals.  she has no difficulty when asked to  pour a full glass of water from one glass to another. Coordination:  There is no decremation with RAM's, with any form of RAMS, including alternating supination and pronation of the forearm, hand opening and closing, finger taps, heel taps and toe taps. Gait and Station: The patient has no difficulty arising out of a deep-seated chair without the use of the hands. The patient's stride length is normal.  Able to ambulate in a tandem fashion.    LABS  Lab Results  Component Value Date   TSH 1.68 03/30/2016      Chemistry      Component Value Date/Time   NA 139 10/05/2016 0947   NA 136 (A) 11/25/2015   K 4.0 10/05/2016 0947   CL 104 10/05/2016 0947   CO2 30 10/05/2016 0947   BUN 17 10/05/2016 0947   BUN 11 11/25/2015   CREATININE 0.87 10/05/2016 0947   GLU 107 11/25/2015      Component Value Date/Time   CALCIUM 8.9 10/05/2016 0947   ALKPHOS 56 11/25/2015   AST 28 11/25/2015   ALT 36 (A) 11/25/2015       ASSESSMENT/PLAN:  1.  Tremor, likely mild essential tremor  -This is evidenced by the symmetrical nature and longstanding hx of gradually getting worse.  We discussed nature and pathophysiology.  We discussed that this can continue to gradually get worse with time.  We discussed that some medications can worsen this, as can caffeine use.  Told her that I  have seen cymbalta cause tremor and cymbalta was just started in August, but she apparently has been on it in the past without issue.  She just wanted to make sure that nothing else was wrong.  We discussed medication therapy as well as surgical therapy.  Ultimately, the patient decided to hold on any med.  If changes mind, beta blocker could be of value as she was tachycardic on arrival.  Told to call if new issues arise or if it gets worse.  Much greater than 50% of this visit was spent in counseling and coordinating care.  Total face to face time:  45 min .     Cc:  [name], MD

## 2016-11-01 ENCOUNTER — Ambulatory Visit (INDEPENDENT_AMBULATORY_CARE_PROVIDER_SITE_OTHER): Payer: Medicare Other | Admitting: Neurology

## 2016-11-01 ENCOUNTER — Encounter: Payer: Self-pay | Admitting: Neurology

## 2016-11-01 VITALS — BP 128/80 | HR 92 | Ht 62.0 in | Wt 166.0 lb

## 2016-11-01 DIAGNOSIS — G250 Essential tremor: Secondary | ICD-10-CM | POA: Diagnosis not present

## 2016-11-02 ENCOUNTER — Encounter (INDEPENDENT_AMBULATORY_CARE_PROVIDER_SITE_OTHER): Payer: Self-pay | Admitting: Orthopaedic Surgery

## 2016-11-02 ENCOUNTER — Ambulatory Visit (INDEPENDENT_AMBULATORY_CARE_PROVIDER_SITE_OTHER): Payer: Medicare Other

## 2016-11-02 ENCOUNTER — Ambulatory Visit (INDEPENDENT_AMBULATORY_CARE_PROVIDER_SITE_OTHER): Payer: Medicare Other | Admitting: Orthopaedic Surgery

## 2016-11-02 VITALS — BP 138/88 | HR 87 | Ht 62.0 in | Wt 166.0 lb

## 2016-11-02 DIAGNOSIS — G8929 Other chronic pain: Secondary | ICD-10-CM

## 2016-11-02 DIAGNOSIS — M9983 Other biomechanical lesions of lumbar region: Secondary | ICD-10-CM | POA: Diagnosis not present

## 2016-11-02 DIAGNOSIS — M5442 Lumbago with sciatica, left side: Secondary | ICD-10-CM

## 2016-11-02 DIAGNOSIS — M5441 Lumbago with sciatica, right side: Secondary | ICD-10-CM | POA: Diagnosis not present

## 2016-11-02 DIAGNOSIS — M48061 Spinal stenosis, lumbar region without neurogenic claudication: Secondary | ICD-10-CM

## 2016-11-02 DIAGNOSIS — M7062 Trochanteric bursitis, left hip: Secondary | ICD-10-CM | POA: Diagnosis not present

## 2016-11-02 MED ORDER — LIDOCAINE HCL 1 % IJ SOLN
0.5000 mL | INTRAMUSCULAR | Status: AC | PRN
  Administered 2016-11-02: .5 mL

## 2016-11-02 MED ORDER — BUPIVACAINE HCL 0.25 % IJ SOLN
2.0000 mL | INTRAMUSCULAR | Status: AC | PRN
  Administered 2016-11-02: 2 mL via INTRA_ARTICULAR

## 2016-11-02 MED ORDER — METHYLPREDNISOLONE ACETATE 40 MG/ML IJ SUSP
40.0000 mg | INTRAMUSCULAR | Status: AC | PRN
  Administered 2016-11-02: 40 mg via INTRA_ARTICULAR

## 2016-11-02 NOTE — Progress Notes (Signed)
Office Visit Note   Patient: Kristina [name]           Date of Birth: 03-03-1951           MRN: [id number] Visit Date: 11/02/2016              Requested by: Betty G Martinique, MD [address] [place], [place] [address] PCP: [name], MD   Assessment & Plan: Visit Diagnoses:  1. Chronic bilateral low back pain with bilateral sciatica   2. Foraminal stenosis of lumbar region   3. Trochanteric bursitis, left hip     Plan: Left greater trochanter Injection performed.  We  discussed pathophysiology of foraminal stenosis. She can call she's having persistent problems. Hopefully with the injection should get some relief. We discussed options including epidural versus repeat MRI scan if her symptoms persist and progress. Her pain tends to wax and wane recently has been more severe.  Follow-Up Instructions: Return if symptoms worsen or fail to improve.   Orders:  Orders Placed This Encounter  Procedures  . Large Joint Injection/Arthrocentesis  . XR Lumbar Spine 2-3 Views   No orders of the defined types were placed in this encounter.     Procedures: Large Joint Inj Date/Time: 11/02/2016 12:06 PM Performed by: Marybelle Killings Authorized by: Marybelle Killings   Consent Given by:  Patient Location:  Hip Site:  L greater trochanter Ultrasound Guidance: No   Fluoroscopic Guidance: No   Arthrogram: No   Medications:  2 mL bupivacaine 0.25 %; 0.5 mL lidocaine 1 %; 40 mg methylPREDNISolone acetate 40 MG/ML      Clinical Data: No additional findings.   Subjective: Chief Complaint  Patient presents with  . Lower Back - Pain    Patient presents with chronic low back pain. She had a MRI in 2015 and has had ESI's since then, the last in April 2016. She has pain into her left hip and groin. The pain radiates down the back of her left thigh to her knee. She states that she does experience some tingling in her left calf. She has some symptoms on the right, but the left is worse.  She had relief from the epidural injections in the beginning, but they do not help that much anymore. She is taking diclofenac and flexeril at night. She cannot take pain medications because they make her sick.     Review of Systems 14 point   systems performed. Pus for history of migraines hyperlipidemia history of basal cell carcinoma. Back pain previous MRI showing by foraminal moderate stenosis L4-5 L5-S1. History goiter, vertigo GERD. Negative for  MI& stroke. Positive history for previous epidural.   Objective: Vital Signs: BP 138/88   Pulse 87   Ht 5\' 2"  (1.575 m)   Wt 166 lb (75.3 kg)   LMP  (LMP Unknown)   BMI 30.36 kg/m   Physical Exam  Constitutional: She is oriented to person, place, and time. She appears well-developed.  HENT:  Head: Normocephalic.  Right Ear: External ear normal.  Left Ear: External ear normal.  Eyes: Pupils are equal, round, and reactive to light.  Neck: No tracheal deviation present. No thyromegaly present.  Cardiovascular: Normal rate.   Pulmonary/Chest: Effort normal.  Abdominal: Soft.  Musculoskeletal:  The patient has normal heel toe gait. No weakness gastrocsoleus anterior tib. I have flexion weakness. She was tenderness over the left greater than right trochanter. Some sciatic notch tenderness tenderness to palpation lumbosacral junction. L4-L5 spinous  process is tender. For flexion and extension with mild discomfort.  Neurological: She is alert and oriented to person, place, and time.  Skin: Skin is warm and dry.  Psychiatric: She has a normal mood and affect. Her behavior is normal.    Ortho Exam  Specialty Comments:  No specialty comments available.  Imaging: MRI report from [place] reviewed from 2015. This shows moderate by foraminal stenosis at L4-5 and also L5-S1. No central stenosis. There was loss of disc space height.   PMFS History: Patient Active Problem List   Diagnosis Date Noted  . GERD (gastroesophageal reflux  disease) 03/30/2016  . Vertigo, benign positional 03/30/2016  . 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 Anxiety disorder 03/29/2016  . Insomnia, unspecified 03/29/2016  . Osteopenia 03/29/2016  . Migraine headache without aura 03/29/2016  . Hyperlipidemia 03/29/2016  . History of basal cell carcinoma 03/29/2016  . Polyarthralgia-OA 03/29/2016  . Back pain, chronic 03/29/2016   Past Medical History:  Diagnosis Date  . Anxiety   . Arthritis   . Cancer ([place])    basal and squamous cell skin  . Chicken pox   . Depression   . GERD (gastroesophageal reflux disease)   . Migraines   . Thyroid disease     Family History  Problem Relation Age of Onset  . Arthritis Mother   . Diabetes Mother   . Heart disease Father   . Stroke Father   . Hypertension Father   . Hyperlipidemia Father   . Diabetes Father   . Breast cancer Maternal Aunt   . Throat cancer Brother     Past Surgical History:  Procedure Laterality Date  . ABDOMINAL HYSTERECTOMY    . CHOLECYSTECTOMY     Social History   Occupational History  . retired     administration   Social History Main Topics  . Smoking status: Former Smoker    Quit date: 11/02/1990  . Smokeless tobacco: Never Used  . Alcohol use Yes     Comment: glass of wine once a month  . Drug use: No  . Sexual activity: No

## 2016-11-11 DIAGNOSIS — D1801 Hemangioma of skin and subcutaneous tissue: Secondary | ICD-10-CM | POA: Diagnosis not present

## 2016-11-11 DIAGNOSIS — L814 Other melanin hyperpigmentation: Secondary | ICD-10-CM | POA: Diagnosis not present

## 2016-11-11 DIAGNOSIS — Z85828 Personal history of other malignant neoplasm of skin: Secondary | ICD-10-CM | POA: Diagnosis not present

## 2016-11-11 DIAGNOSIS — L821 Other seborrheic keratosis: Secondary | ICD-10-CM | POA: Diagnosis not present

## 2016-11-11 DIAGNOSIS — L820 Inflamed seborrheic keratosis: Secondary | ICD-10-CM | POA: Diagnosis not present

## 2016-11-15 NOTE — Progress Notes (Addendum)
HPI:   [name] is a 66 y.o. female, who is here today to follow on some chronic medical problems we addressed last OV.  She was last seen on 10/05/16.concerns  Since her last OV she has seen neurologists,Dr Tat, for tremor. She has also seen ortho, Dr Lorin Mercy.  Anxiety: Last OV Cymbalta was changed to Effexor but she did not start Effexor,she was afraid of having any side effect. She forgot to take Cymbalta for a few days and "got sick", felt better when she resumed it.  She is also on Alprazolam 0.25 mg,which she has taken for years.  She was also c/o palpitations, which she has had for a while, she has not had any since her last OV.  She is back to estrogen patch for hot flashes,follows with gyn.   GERD:  Last OV she was c/o cough and heartburn.Omeprazole was increased from 20 mg to 40 mg. CXR on 10/07/16: Mild hyperinflation and interstitial prominence consistent with the smoking history and chronic bronchitis. No alveolar pneumonia nor CHF.Thoracic aortic atherosclerosis.  Cough and heartburn have resolved.  She denies wheezing or dyspnea but states that for a while she sometimes feels like she "can not fill her lungs" and has "to breath deep a few times to feel satisfy" She thinks this may be anxiety, which she also has been told by former PCP. This can happen at rest but most of the time with mild-moderate physical activity. No chest pain,diaphoresis,or dizziness. Former smoker. She did not try inhalers recommended a few months ago because cost: LABA/ICS.  Denies abdominal pain, nausea, vomiting, changes in bowel habits, blood in stool or melena.   Lower back pain radiated to lateral aspect LLE ,occasional tingling. Pian was interferring with sleep. She also has Hx of generalized OA, Cymbalta did not help.Taking Diclofenac 75 mg once daily as needed.  Gabapentin 300 mg was recommended at bedtime but she did not take it. LLE pain is much better after steroid  injection given by ortho.    Review of Systems  Constitutional: Positive for fatigue. Negative for activity change, appetite change, fever and unexpected weight change.  HENT: Negative for mouth sores, nosebleeds and trouble swallowing.   Eyes: Negative for redness and visual disturbance.  Respiratory: Negative for cough and wheezing.   Cardiovascular: Negative for chest pain, palpitations and leg swelling.  Gastrointestinal: Negative for abdominal pain, nausea and vomiting.       Negative for changes in bowel habits.  Genitourinary: Negative for decreased urine volume and hematuria.  Musculoskeletal: Positive for arthralgias. Negative for gait problem.  Neurological: Negative for syncope, weakness and headaches.  Psychiatric/Behavioral: Negative for confusion. The patient is nervous/anxious.       Current Outpatient Prescriptions on File Prior to Visit  Medication Sig Dispense Refill  . ALPRAZolam (XANAX) 0.25 MG tablet Take 1 tablet (0.25 mg total) by mouth at bedtime. 90 tablet 1  . Azelaic Acid (FINACEA) 15 % cream Apply 1 application topically 2 (two) times daily. After skin is thoroughly washed and patted dry, gently but thoroughly massage a thin film of azelaic acid cream into the affected area twice daily, in the morning and evening.    . cyclobenzaprine (FLEXERIL) 10 MG tablet Take 1 tablet (10 mg total) by mouth daily. 90 tablet 1  . diclofenac (VOLTAREN) 50 MG EC tablet Take 2 tablets (100 mg total) by mouth daily. 180 tablet 1  . estradiol (CLIMARA - DOSED IN MG/24 HR) 0.075 mg/24hr  patch Place 1 patch onto the skin once a week.    . levothyroxine (SYNTHROID, LEVOTHROID) 25 MCG tablet Take 0.5 tablets (12.5 mcg total) by mouth daily. 45 tablet 1  . Magnesium 250 MG TABS Take by mouth daily.    . meclizine (ANTIVERT) 12.5 MG tablet Take 1 tablet (12.5 mg total) by mouth 3 (three) times daily as needed for dizziness. 60 tablet 1  . Multiple Vitamin (MULTIVITAMIN) tablet Take 1  tablet by mouth daily.    Marland Kitchen omeprazole (PRILOSEC) 40 MG capsule Take 1 capsule (40 mg total) by mouth daily. 30 capsule 3  . vitamin B-12 (CYANOCOBALAMIN) 1000 MCG tablet Take 1,000 mcg by mouth once a week.     No current facility-administered medications on file prior to visit.      Past Medical History:  Diagnosis Date  . Anxiety   . Arthritis   . Cancer ([place])    basal and squamous cell skin  . Chicken pox   . Depression   . GERD (gastroesophageal reflux disease)   . Migraines   . Thyroid disease    Allergies  Allergen Reactions  . Meloxicam   . Sulfa Antibiotics Rash    Rash all over body/fever    Social History   Social History  . Marital status: Single    Spouse name: N/A  . Number of children: N/A  . Years of education: N/A   Occupational History  . retired     administration   Social History Main Topics  . Smoking status: Former Smoker    Quit date: 11/02/1990  . Smokeless tobacco: Never Used  . Alcohol use Yes     Comment: glass of wine once a month  . Drug use: No  . Sexual activity: No   Other Topics Concern  . None   Social History Narrative  . None    Vitals:   11/16/16 0856  BP: 136/82  Pulse: 94  Resp: 12  O2 sat at RA 95% Body mass index is 30.82 kg/m.   Physical Exam  Nursing note and vitals reviewed. Constitutional: She is oriented to person, place, and time. She appears well-developed. No distress.  HENT:  Head: Normocephalic and atraumatic.  Eyes: Conjunctivae and EOM are normal.  Cardiovascular: Normal rate and regular rhythm.   No murmur heard. Pulses:      Dorsalis pedis pulses are 2+ on the right side, and 2+ on the left side.  Respiratory: Effort normal and breath sounds normal. No respiratory distress.  GI: Soft. She exhibits no mass. There is no hepatomegaly. There is no tenderness.  Musculoskeletal: She exhibits no edema.  + Tenderness upon palpation of paraspinal muscles:thoracic and lumbar with muscle spasm  bilateral.  Lymphadenopathy:    She has no cervical adenopathy.  Neurological: She is alert and oriented to person, place, and time. She has normal strength. Gait normal.  Skin: Skin is warm. No erythema.  Psychiatric: Her mood appears anxious.  Well groomed, good eye contact.    ASSESSMENT AND PLAN:   Kristina [name] was seen today for follow-up.  Diagnoses and all orders for this visit:  Gastroesophageal reflux disease without esophagitis  Improved,asymptomatic now. No changes in current management. GERD precautions to continue. F/U in 6-12 months.  Dyspnea, unspecified type  The sensation she describes: Feeling like she is not getting enough air in one breath, could certainly be related to anxiety but also possible COPD, obesity among some. She could try Albuterol inh as needed and  monitor for changes in symptoms. Instructed about warning signs. F/U in 3-4 months before if needed.  -     albuterol (PROVENTIL HFA;VENTOLIN HFA) 108 (90 Base) MCG/ACT inhaler; Inhale 2 puffs into the lungs every 6 (six) hours as needed for wheezing or shortness of breath.  Chronic low back pain, unspecified back pain laterality, with sciatica presence unspecified  Cymbalta to start weaning off. Continue following with ortho.  -     DULoxetine (CYMBALTA) 30 MG capsule; Take 1 capsule (30 mg total) by mouth daily.  Generalized osteoarthritis of multiple sites  She did not notice any difference with Cymbalta,so she will start weaning of medication. Some side effects of chronic NSAID's discussed,including GI and CV.  -     DULoxetine (CYMBALTA) 30 MG capsule; Take 1 capsule (30 mg total) by mouth daily.  Other specified anxiety disorders  Symptomatic still. No changes in Alprazolam. She is not interested in trying Effexor or other anxiolytic medication for now. F/U in 4 months.    -Ms. Kristina [name] was advised to return sooner than planned today if new concerns arise.       Betty G.  Martinique, MD  [facility]. [place] office.

## 2016-11-16 ENCOUNTER — Ambulatory Visit (INDEPENDENT_AMBULATORY_CARE_PROVIDER_SITE_OTHER): Payer: Medicare Other | Admitting: Family Medicine

## 2016-11-16 ENCOUNTER — Encounter: Payer: Self-pay | Admitting: Family Medicine

## 2016-11-16 VITALS — BP 136/82 | HR 94 | Resp 12 | Ht 62.0 in | Wt 168.5 lb

## 2016-11-16 DIAGNOSIS — M545 Low back pain: Secondary | ICD-10-CM | POA: Diagnosis not present

## 2016-11-16 DIAGNOSIS — M159 Polyosteoarthritis, unspecified: Secondary | ICD-10-CM | POA: Diagnosis not present

## 2016-11-16 DIAGNOSIS — K219 Gastro-esophageal reflux disease without esophagitis: Secondary | ICD-10-CM | POA: Diagnosis not present

## 2016-11-16 DIAGNOSIS — F418 Other specified anxiety disorders: Secondary | ICD-10-CM

## 2016-11-16 DIAGNOSIS — G8929 Other chronic pain: Secondary | ICD-10-CM | POA: Diagnosis not present

## 2016-11-16 DIAGNOSIS — R0600 Dyspnea, unspecified: Secondary | ICD-10-CM | POA: Diagnosis not present

## 2016-11-16 MED ORDER — ALBUTEROL SULFATE HFA 108 (90 BASE) MCG/ACT IN AERS: 2.0000 | INHALATION_SPRAY | Freq: Four times a day (QID) | RESPIRATORY_TRACT | 0 refills | Status: DC | PRN

## 2016-11-16 MED ORDER — OMEPRAZOLE 40 MG PO CPDR: 40.0000 mg | DELAYED_RELEASE_CAPSULE | Freq: Every day | ORAL | 1 refills | Status: DC

## 2016-11-16 MED ORDER — DULOXETINE HCL 30 MG PO CPEP: 30.0000 mg | ORAL_CAPSULE | Freq: Every day | ORAL | 2 refills | Status: DC

## 2016-11-16 NOTE — Progress Notes (Signed)
Pre visit review using our clinic review tool, if applicable. No additional management support is needed unless otherwise documented below in the visit note. 

## 2016-11-16 NOTE — Patient Instructions (Signed)
A few things to remember from today's visit:   Gastroesophageal reflux disease without esophagitis  Other specified anxiety disorders  Insomnia, unspecified type  Chronic bilateral low back pain with left-sided sciatica  Cymbalta decreased to 30 mg,continue daily for 3-4 weeks then start weaning off: q 2 days for 2 weeks ,q 3 days for 2 week and so fourth.  Albuterol inh 2 puff 20 min before exercising or when feeling short of breath.   Please be sure medication list is accurate. If a new problem present, please set up appointment sooner than planned today.

## 2017-01-01 ENCOUNTER — Other Ambulatory Visit: Payer: Self-pay | Admitting: Family Medicine

## 2017-01-01 DIAGNOSIS — K219 Gastro-esophageal reflux disease without esophagitis: Secondary | ICD-10-CM

## 2017-01-11 ENCOUNTER — Encounter: Payer: Self-pay | Admitting: Internal Medicine

## 2017-01-20 ENCOUNTER — Ambulatory Visit (INDEPENDENT_AMBULATORY_CARE_PROVIDER_SITE_OTHER): Payer: Medicare Other | Admitting: Podiatry

## 2017-01-20 ENCOUNTER — Encounter: Payer: Self-pay | Admitting: Podiatry

## 2017-01-20 ENCOUNTER — Ambulatory Visit (INDEPENDENT_AMBULATORY_CARE_PROVIDER_SITE_OTHER): Payer: Medicare Other

## 2017-01-20 DIAGNOSIS — M21079 Valgus deformity, not elsewhere classified, unspecified ankle: Secondary | ICD-10-CM

## 2017-01-20 DIAGNOSIS — R52 Pain, unspecified: Secondary | ICD-10-CM

## 2017-01-20 DIAGNOSIS — M129 Arthropathy, unspecified: Secondary | ICD-10-CM

## 2017-01-20 DIAGNOSIS — M205X1 Other deformities of toe(s) (acquired), right foot: Secondary | ICD-10-CM | POA: Diagnosis not present

## 2017-01-21 NOTE — Progress Notes (Addendum)
   Subjective:    Patient ID: Kristina [name], female    DOB: 11-13-1950, 66 y.o.   MRN: [id number]  HPI this patient presents the office with chief complaint of a painful right big toe joint. She presents the office saying that she has moved from [place] to [place]. She says he diagnosed her as having arthritis in the big toe joint and proceeded to provide her with injection therapy about 1 year ago. She states she has done well until the last few months where the pain is returning. She presents the office today stating she has pain and discomfort at the top of the big toe joint inside border.  She says this is aggravated during activity. She presents the office today for an evaluation of her painful foot and desires to discuss surgical correction of this condition.  She brings with her a pamphlet  given to her by her [place] doctor concerning implants in the big toe joint.    Review of Systems  All other systems reviewed and are negative.      Objective:   Physical Exam GENERAL APPEARANCE: Alert, conversant. Appropriately groomed. No acute distress.  VASCULAR: Pedal pulses are  palpable at  [facility] and PT bilateral.  Capillary refill time is immediate to all digits,  Normal temperature gradient.  Digital hair growth is present bilateral  NEUROLOGIC: sensation is normal to 5.07 monofilament at 5/5 sites bilateral.  Light touch is intact bilateral, Muscle strength normal.  MUSCULOSKELETAL: acceptable muscle strength, tone and stability bilateral.  Intrinsic muscluature intact bilateral.  Rectus appearance of foot and digits noted bilateral.   DERMATOLOGIC: skin color, texture, and turgor are within normal limits.  No preulcerative lesions or ulcers  are seen, no interdigital maceration noted.  No open lesions present.  Digital nails are asymptomatic. No drainage noted.         Assessment & Plan:  DJD 1st MPJ  Right foot  Hallux limitus   IE  X-rays were taken for this patient and are  revealed. There are dorsal changes on the dorsal aspect of the first metatarsal.  There is also narrowing of the big toe joint.  There appears to be excessive bone callus noted on the medial aspect of the second and third metatarsals.  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 She was treated with injection therapy.  She says she has orthotics already at home.  I recommended she make an appointment with Dr. Milinda Pointer for surgical consultation for the correction of this big toe joint.  She was told to take Voltaren at home for this condition.  Return to clinic when necessary for pain.   Gardiner Barefoot DPM

## 2017-01-24 ENCOUNTER — Ambulatory Visit (INDEPENDENT_AMBULATORY_CARE_PROVIDER_SITE_OTHER): Payer: Medicare Other

## 2017-01-24 ENCOUNTER — Ambulatory Visit (INDEPENDENT_AMBULATORY_CARE_PROVIDER_SITE_OTHER): Payer: Medicare Other | Admitting: Podiatry

## 2017-01-24 DIAGNOSIS — G5762 Lesion of plantar nerve, left lower limb: Secondary | ICD-10-CM | POA: Diagnosis not present

## 2017-01-24 DIAGNOSIS — S92912A Unspecified fracture of left toe(s), initial encounter for closed fracture: Secondary | ICD-10-CM

## 2017-01-24 DIAGNOSIS — M792 Neuralgia and neuritis, unspecified: Secondary | ICD-10-CM

## 2017-01-24 NOTE — Progress Notes (Signed)
This patient presents the office with chief complaint of a red, swollen top of her left foot. She says it has been developing the last few days. She says the redness and swelling is getting worse over time and she presents the office today for an evaluation. She also says that she has a sharp, radiating pain noted from her midfoot into her big toe on her left foot. She says this is been present since the foot has become painful and swollen. She was seen in [place] last week and diagnosed with a hallux limitus big toe joint of the right foot and treated with injection therapy. She says that she is having no pain or discomfort in the big toe joint at this time.  GENERAL APPEARANCE: Alert, conversant. Appropriately groomed. No acute distress.  VASCULAR: Pedal pulses are  palpable at  [facility] and PT bilateral.  Capillary refill time is immediate to all digits,  Normal temperature gradient.  Digital hair growth is present bilateral  NEUROLOGIC: sensation is normal to 5.07 monofilament at 5/5 sites bilateral.  Light touch is intact bilateral, Muscle strength normal.  Sharp, radiating pain noted over the first and second metatarsocuneiform joint, left foot.  No evidence of swelling noted MUSCULOSKELETAL: acceptable muscle strength, tone and stability bilateral.  Intrinsic muscluature intact bilateral.  Rectus appearance of foot and digits noted bilateral. Arthritic changes noted on the midfoot bilaterally.  Red swollen painful second and third metatarsals of the left foot   DERMATOLOGIC: skin color, texture, and turgor are within normal limits.  No preulcerative lesions or ulcers  are seen, no interdigital maceration noted.  No open lesions present.  Digital nails are asymptomatic. No drainage noted.  Neuralgia left foot  Stress fractures 2,3 left foot   Return office visit. X-rays were taken reveal no bony pathology noted. The second and third metatarsal left foot. Discussed the condition with this patient and  we decided to treat her with an anklet as well as a Cam Walker.  Also discussed her neuralgia which is coming from the arthritis on the midfoot, left. This should resolve when she wears her Cam Walker. Return to the clinic in 4 weeks for further evaluation and treatment   Gardiner Barefoot DPM

## 2017-02-23 ENCOUNTER — Telehealth: Payer: Self-pay | Admitting: *Deleted

## 2017-02-23 ENCOUNTER — Ambulatory Visit (AMBULATORY_SURGERY_CENTER): Payer: Self-pay | Admitting: *Deleted

## 2017-02-23 VITALS — Ht 62.0 in | Wt 171.0 lb

## 2017-02-23 DIAGNOSIS — Z1211 Encounter for screening for malignant neoplasm of colon: Secondary | ICD-10-CM

## 2017-02-23 NOTE — Telephone Encounter (Signed)
Faxed  ROI to [facility], [place], [place] fax # [phone], Dr Golda Acre.

## 2017-02-23 NOTE — Telephone Encounter (Signed)
Patient states her last colonoscopy was done in [place] with Dr.Sidney Neimark. Patient unsure if she had polyps, she was told to repeat colonoscopy in 5 years but insurance would not pay so she has not had colon since 2007. Release of information signed by patient and given to [place], [place].

## 2017-02-23 NOTE — Progress Notes (Signed)
Patient denies any allergies to eggs or soy. Patient has had nausea after surgery and blood pressure drops with anesthesia, patient states no problems after colonoscopy in the past. Patient denies any oxygen use at home and does not take any diet/weight loss medications. EMMI education assisgned to patient on colonoscopy, this was explained and instructions given to patient. Release form signed by patient given to CMA.

## 2017-02-23 NOTE — Progress Notes (Signed)
During PV patient was requesting to have EGD with her colonoscopy. She states she did see her PCP for GERD and her ppi was increased but she is still having reflux and a lot of burping. When I explained to her that Dr.Gessner would have to ok this and she have to be rescheduled to have both exams on the same day she decided not to do both. I encouraged patient to speak with Dr.Gessner about her symptoms on her exam day. Also patient gave me her 2014 EGD discharge sheet copy made and placed on Dr.Gessner's desk for him to review. Release of information form filled out and faxed by P.J. CMA so we can get all of her records from [facility].

## 2017-03-09 ENCOUNTER — Encounter: Payer: Self-pay | Admitting: Internal Medicine

## 2017-03-09 ENCOUNTER — Ambulatory Visit (AMBULATORY_SURGERY_CENTER): Payer: Medicare Other | Admitting: Internal Medicine

## 2017-03-09 VITALS — BP 156/95 | HR 77 | Temp 96.8°F | Resp 10 | Ht 62.0 in | Wt 171.0 lb

## 2017-03-09 DIAGNOSIS — Z1212 Encounter for screening for malignant neoplasm of rectum: Secondary | ICD-10-CM

## 2017-03-09 DIAGNOSIS — Z1211 Encounter for screening for malignant neoplasm of colon: Secondary | ICD-10-CM

## 2017-03-09 DIAGNOSIS — J449 Chronic obstructive pulmonary disease, unspecified: Secondary | ICD-10-CM | POA: Diagnosis not present

## 2017-03-09 MED ORDER — SODIUM CHLORIDE 0.9 % IV SOLN: 500.0000 mL | INTRAVENOUS | Status: AC

## 2017-03-09 NOTE — Progress Notes (Signed)
Pt's states no medical or surgical changes since previsit or office visit. 

## 2017-03-09 NOTE — Progress Notes (Signed)
No problems noted in the recovery room. maw 

## 2017-03-09 NOTE — Op Note (Signed)
[place] Patient Name: Kristina [name] Procedure Date: 03/09/2017 9:10 AM MRN: [id number] Endoscopist: Gatha Mayer , MD Age: 66 Referring MD:  Date of Birth: March 22, 1951 Gender: Female Account #: [id number] Procedure:                Colonoscopy Indications:              Screening for colorectal malignant neoplasm Medicines:                Propofol per Anesthesia, Monitored Anesthesia Care Procedure:                Pre-Anesthesia Assessment:                           - Prior to the procedure, a History and Physical                            was performed, and patient medications and                            allergies were reviewed. The patient's tolerance of                            previous anesthesia was also reviewed. The risks                            and benefits of the procedure and the sedation                            options and risks were discussed with the patient.                            All questions were answered, and informed consent                            was obtained. Prior Anticoagulants: The patient has                            taken no previous anticoagulant or antiplatelet                            agents. ASA Grade Assessment: II - A patient with                            mild systemic disease. After reviewing the risks                            and benefits, the patient was deemed in                            satisfactory condition to undergo the procedure.                           After obtaining informed consent, the colonoscope  was passed under direct vision. Throughout the                            procedure, the patient's blood pressure, pulse, and                            oxygen saturations were monitored continuously. The                            Colonoscope was introduced through the anus and                            advanced to the the cecum, identified by   appendiceal orifice and ileocecal valve. The                            quality of the bowel preparation was excellent. The                            colonoscopy was performed without difficulty. The                            patient tolerated the procedure well. The bowel                            preparation used was Miralax. The ileocecal valve,                            appendiceal orifice, and rectum were photographed. Scope In: 9:20:48 AM Scope Out: 9:34:07 AM Scope Withdrawal Time: 0 hours 10 minutes 51 seconds  Total Procedure Duration: 0 hours 13 minutes 19 seconds  Findings:                 The perianal and digital rectal examinations were                            normal.                           The colon (entire examined portion) appeared normal.                           No additional abnormalities were found on                            retroflexion. Complications:            No immediate complications. Estimated blood loss:                            None. Estimated Blood Loss:     Estimated blood loss: none. Recommendation:           - Repeat colonoscopy in 10 years for screening                            purposes.                           -  Patient has a contact number available for                            emergencies. The signs and symptoms of potential                            delayed complications were discussed with the                            patient. Return to normal activities tomorrow.                            Written discharge instructions were provided to the                            patient.                           - Resume previous diet.                           - Continue present medications. Gatha Mayer, MD 03/09/2017 9:38:01 AM This report has been signed electronically.

## 2017-03-09 NOTE — Patient Instructions (Addendum)
No polyps or cancer seen.  Next routine colonoscopy or other screening test in 10 years - 2028  I appreciate the opportunity to care for you. Gatha Mayer, MD, FACG    YOU HAD AN ENDOSCOPIC PROCEDURE TODAY AT [facility]:   Refer to the procedure report that was given to you for any specific questions about what was found during the examination.  If the procedure report does not answer your questions, please call your gastroenterologist to clarify.  If you requested that your care partner not be given the details of your procedure findings, then the procedure report has been included in a sealed envelope for you to review at your convenience later.  YOU SHOULD EXPECT: Some feelings of bloating in the abdomen. Passage of more gas than usual.  Walking can help get rid of the air that was put into your GI tract during the procedure and reduce the bloating. If you had a lower endoscopy (such as a colonoscopy or flexible sigmoidoscopy) you may notice spotting of blood in your stool or on the toilet paper. If you underwent a bowel prep for your procedure, you may not have a normal bowel movement for a few days.  Please Note:  You might notice some irritation and congestion in your nose or some drainage.  This is from the oxygen used during your procedure.  There is no need for concern and it should clear up in a day or so.  SYMPTOMS TO REPORT IMMEDIATELY:   Following lower endoscopy (colonoscopy or flexible sigmoidoscopy):  Excessive amounts of blood in the stool  Significant tenderness or worsening of abdominal pains  Swelling of the abdomen that is new, acute  Fever of 100F or higher   For urgent or emergent issues, a gastroenterologist can be reached at any hour by calling [phone].   DIET:  We do recommend a small meal at first, but then you may proceed to your regular diet.  Drink plenty of fluids but you should avoid alcoholic beverages for 24  hours.  ACTIVITY:  You should plan to take it easy for the rest of today and you should NOT DRIVE or use heavy machinery until tomorrow (because of the sedation medicines used during the test).    FOLLOW UP: Our staff will call the number listed on your records the next business day following your procedure to check on you and address any questions or concerns that you may have regarding the information given to you following your procedure. If we do not reach you, we will leave a message.  However, if you are feeling well and you are not experiencing any problems, there is no need to return our call.  We will assume that you have returned to your regular daily activities without incident.  If any biopsies were taken you will be contacted by phone or by letter within the next 1-3 weeks.  Please call us at [phone] if you have not heard about the biopsies in 3 weeks.    SIGNATURES/CONFIDENTIALITY: You and/or your care partner have signed paperwork which will be entered into your electronic medical record.  These signatures attest to the fact that that the information above on your After Visit Summary has been reviewed and is understood.  Full responsibility of the confidentiality of this discharge information lies with you and/or your care-partner.     You may resume your current medications today. Next routine colonoscopy for screening purpose in 10 years.  Please call if any questions or concerns.

## 2017-03-09 NOTE — Progress Notes (Signed)
A/ox3 pleased with MAC, report to  [facility]

## 2017-03-10 ENCOUNTER — Telehealth: Payer: Self-pay | Admitting: *Deleted

## 2017-03-10 NOTE — Telephone Encounter (Signed)
  Follow up Call-  Call back number 03/09/2017  Post procedure Call Back phone  # [phone]  Permission to leave phone message Yes     Patient questions:  Do you have a fever, pain , or abdominal swelling? No. Pain Score  0 *  Have you tolerated food without any problems? Yes.    Have you been able to return to your normal activities? Yes.    Do you have any questions about your discharge instructions: Diet   No. Medications  No. Follow up visit  No.  Do you have questions or concerns about your Care? No.  Actions: * If pain score is 4 or above: No action needed, pain <4.

## 2017-03-17 ENCOUNTER — Telehealth: Payer: Self-pay

## 2017-03-17 NOTE — Progress Notes (Signed)
HPI:   [name] is a 67 y.o. female, who is here today for 4 moths follow up  Since her last OV on 11/16/16 she has followed with GI,Dr Carlean Purl, and with podiatrists, Dr Prudence Davidson. She is reporting that since her last OV she had spontaneous toe fracture left foot. DEXA 09/2015 osteopenia, per pt report. In [place].   Generalized OA and back pain, she is following with ortho. She received a "hip shot" a few days ago. She is on Diclofenac 50 mg bid and Flexeril 10 mg once daily.  Last OV she did report that Cymbalta did not help with OA, so dose was decreased from 60 gm to 30 mg and instructed to wean medication off.  She is still taking Cymbalta 30 mg. She feels like she has more arthralgias and stiffness, mainly in the morning and after prolonged resting. Alleviated by movement, last < 1 hours. She has not noted edema or erythema. IP and MCP both hands, also shoulders, and lower back.  She brought a copy of lab ordered by rheumatologists in [place] , 09/2015: CCP elevated at 35. She had other labs done and otherwise normal, she states that she was recommended to establish with rheuma here but she has not done so.   Anxiety: She is on Alprazolam 0.25 mg at bedtime for 20+ years , which she has taken for years. She gets 3 months supply at the time, which I agree with given her low dose and compliance with treatment.  She did not feel like Cymbalta helped. Last OV she was not interested in trying another medication. She noted mild worsening of anxiety after decreasing Cymbalta. She denies suicidal thoughts. She denies changes in sleep pattern.  Last OV she was c/o dyspnea, mainly with exertion. She needs of taking frequent deep breaths. States that this is stable, she feels like she cannot "not fill up" her lungs.   Exertional dyspnea with walking hills, denies associated diaphoresis, palpitations, chest pain, or dizziness.  Last OV I suggest trying Albuterol inh and monitor  for changes. States that Albuterol inh 15 min before exertion helps some but still has dyspnea.  No cough or wheezing.  CXR 10/2016: Mild hyperinflation and interstitial prominence consistent with the smoking history and chronic bronchitis. No alveolar pneumonia nor CHF.Thoracic aortic atherosclerosis.  Last week she had 2 days of intermittent episodes of chest tightness. This happened while she was on the couch resting. Episodes lasted about 30 minutes each, she denies radiation or associated symptoms. She is reporting cardiac work-up that included stress test and cardiac cath when she was 66 yo. She presented to the ER c/o chest pressure radiated to neck. According to pt, "everyting was fine."  GERD: "Burps a lot", heartburn improved with increasing Omeprazole dose. She is following with GI.  She is on hormonal therapy for hot flashes. Hx of HLD.  Review of Systems  Constitutional: Positive for fatigue. Negative for activity change, appetite change, fever and unexpected weight change.  HENT: Negative for mouth sores, nosebleeds and trouble swallowing.   Eyes: Negative for redness and visual disturbance.  Respiratory: Positive for chest tightness and shortness of breath. Negative for cough and wheezing.   Cardiovascular: Negative for palpitations and leg swelling.  Gastrointestinal: Negative for abdominal pain, nausea and vomiting.       Negative for changes in bowel habits.  Genitourinary: Negative for decreased urine volume, difficulty urinating, dysuria and hematuria.  Musculoskeletal: Positive for arthralgias and back pain. Negative  for gait problem.  Skin: Negative for rash.  Neurological: Negative for seizures, syncope, weakness, numbness and headaches.  Psychiatric/Behavioral: Negative for confusion. The patient is nervous/anxious.      Current Outpatient Prescriptions on File Prior to Visit  Medication Sig Dispense Refill  . albuterol (PROVENTIL HFA;VENTOLIN HFA) 108 (90  Base) MCG/ACT inhaler Inhale 2 puffs into the lungs every 6 (six) hours as needed for wheezing or shortness of breath. 1 Inhaler 0  . ALPRAZolam (XANAX) 0.25 MG tablet Take 1 tablet (0.25 mg total) by mouth at bedtime. 90 tablet 1  . Azelaic Acid (FINACEA) 15 % cream Apply 1 application topically 2 (two) times daily. After skin is thoroughly washed and patted dry, gently but thoroughly massage a thin film of azelaic acid cream into the affected area twice daily, in the morning and evening.    . cyclobenzaprine (FLEXERIL) 10 MG tablet TAKE 1 TABLET (10 MG TOTAL) BY MOUTH DAILY. 90 tablet 1  . diclofenac (VOLTAREN) 50 MG EC tablet Take 2 tablets (100 mg total) by mouth daily. 180 tablet 1  . levothyroxine (SYNTHROID, LEVOTHROID) 25 MCG tablet Take 0.5 tablets (12.5 mcg total) by mouth daily. 45 tablet 1  . Magnesium 250 MG TABS Take by mouth daily.    . meclizine (ANTIVERT) 12.5 MG tablet Take 1 tablet (12.5 mg total) by mouth 3 (three) times daily as needed for dizziness. 60 tablet 1  . Multiple Vitamin (MULTIVITAMIN) tablet Take 1 tablet by mouth daily.    Marland Kitchen omeprazole (PRILOSEC) 40 MG capsule TAKE 1 CAPSULE (40 MG TOTAL) BY MOUTH DAILY. 90 capsule 2  . vitamin B-12 (CYANOCOBALAMIN) 1000 MCG tablet Take 1,000 mcg by mouth once a week.     Current Facility-Administered Medications on File Prior to Visit  Medication Dose Route Frequency Provider Last Rate Last Dose  . 0.9 %  sodium chloride infusion  500 mL Intravenous Continuous Gatha Mayer, MD         Past Medical History:  Diagnosis Date  . Anxiety   . Arthritis   . Cancer ([place])    basal and squamous cell skin  . Chicken pox   . COPD (chronic obstructive pulmonary disease) ([place])   . Depression   . GERD (gastroesophageal reflux disease)   . Migraines   . Thyroid disease    Allergies  Allergen Reactions  . Meloxicam Other (See Comments)    "skin infection on leg"  . Sulfa Antibiotics Rash    Rash all over body/fever     Social History   Social History  . Marital status: Single    Spouse name: N/A  . Number of children: N/A  . Years of education: N/A   Occupational History  . retired     administration   Social History Main Topics  . Smoking status: Former Smoker    Quit date: 11/02/1990  . Smokeless tobacco: Never Used  . Alcohol use Yes     Comment: glass of wine once a month  . Drug use: No  . Sexual activity: No   Other Topics Concern  . None   Social History Narrative  . None    Vitals:   03/18/17 1021  BP: 132/80  Pulse: 95  Resp: 12  SpO2: 97%   Body mass index is 31.46 kg/m.   Physical Exam  Nursing note and vitals reviewed. Constitutional: She is oriented to person, place, and time. She appears well-developed. No distress.  HENT:  Head: Normocephalic and atraumatic.  Mouth/Throat: Oropharynx is clear and moist and mucous membranes are normal.  Eyes: Pupils are equal, round, and reactive to light. Conjunctivae are normal.  Cardiovascular: Normal rate and regular rhythm.   No murmur heard. Pulses:      Dorsalis pedis pulses are 2+ on the right side, and 2+ on the left side.  Respiratory: Effort normal and breath sounds normal. No respiratory distress.  GI: Soft. She exhibits no mass. There is no hepatomegaly. There is no tenderness.  Musculoskeletal: She exhibits edema (Trace pitting edema LE, bilateral). She exhibits no tenderness.  No major deformities appreciated, no signs of synovitis. No pain upon palpation of thoracic and lumbar paraspinal muscles bilaterally. She has some pain upon movement on exam table.  Lymphadenopathy:    She has no cervical adenopathy.  Neurological: She is alert and oriented to person, place, and time. She has normal strength. Gait normal.  Skin: Skin is warm. No rash noted. No erythema.  Psychiatric: Her mood appears anxious.  Well groomed, good eye contact.     ASSESSMENT AND PLAN:   Ms. Kristina [name] was seen today for  follow-up.  Diagnoses and all orders for this visit:  Exertional dyspnea  We discussed possible etiologies: deconditioning,obesity, pulmonary, and cardiac among some. Albuterol inh help some but still symptomatic. Instructed about warning signs.  -     EKG 12-Lead -     Ambulatory referral to Cardiology  Chest tightness  Asymptomatic at this time. ? Cardiac (atypical) ?Anxiety. ? GI.  EKG today:  SR, normal axis, PR interval short. No signs of acute ischemia. No major changes when compared with EKG done 10/05/16  Cardiology referral placed. Instructed about warning signs.  -     EKG 12-Lead  Gastroesophageal reflux disease, esophagitis presence not specified  Still burping frequently but rest of symptoms improved. Continue GERD precautions and Omeprazole. Continue following with GI.  Generalized osteoarthritis of multiple sites  It seems like Cymbalta 60 mg was helping with arthralgias,so increased from 30 mg to 60 mg. Some side effects discussed. Continue Diclofenac, aware of side effects with chronic NSAID's use. F/U in 3 months.  -     DULoxetine (CYMBALTA) 60 MG capsule; Take 1 capsule (60 mg total) by mouth daily. -     Ambulatory referral to Rheumatology  Cyclic citrullinated peptide (CCP) antibody positive  She has other blood work done by [facility] in [place]. Rheuma referral placed.  -     Ambulatory referral to Rheumatology  Other specified anxiety disorders  Increase Cymbalta from 30 mg to 60 mg. No changes in Alprazolam 0.25 mg daily. Instructed about warning signs. F/U in 3 months.  -     DULoxetine (CYMBALTA) 60 MG capsule; Take 1 capsule (60 mg total) by mouth daily.    -Ms. Kristina [name] was advised to return sooner than planned today if new concerns arise.       Kristina [name] G. Martinique, MD  [facility]. [place] office.

## 2017-03-17 NOTE — Telephone Encounter (Signed)
Patient informed and no questions.

## 2017-03-17 NOTE — Telephone Encounter (Signed)
-----   Message from Gatha Mayer, MD sent at 03/17/2017  8:49 AM EDT ----- Regarding: no EGD needed Call patient and lether know I have seen and reviewed EGD and path results from past and do not think she needs an EGD  Let me know if any ?

## 2017-03-18 ENCOUNTER — Ambulatory Visit (INDEPENDENT_AMBULATORY_CARE_PROVIDER_SITE_OTHER): Payer: Medicare Other | Admitting: Family Medicine

## 2017-03-18 ENCOUNTER — Encounter: Payer: Self-pay | Admitting: Family Medicine

## 2017-03-18 VITALS — BP 132/80 | HR 95 | Resp 12 | Ht 62.0 in | Wt 172.0 lb

## 2017-03-18 DIAGNOSIS — M159 Polyosteoarthritis, unspecified: Secondary | ICD-10-CM | POA: Diagnosis not present

## 2017-03-18 DIAGNOSIS — R7989 Other specified abnormal findings of blood chemistry: Secondary | ICD-10-CM

## 2017-03-18 DIAGNOSIS — R768 Other specified abnormal immunological findings in serum: Secondary | ICD-10-CM

## 2017-03-18 DIAGNOSIS — R0789 Other chest pain: Secondary | ICD-10-CM | POA: Diagnosis not present

## 2017-03-18 DIAGNOSIS — K219 Gastro-esophageal reflux disease without esophagitis: Secondary | ICD-10-CM

## 2017-03-18 DIAGNOSIS — F418 Other specified anxiety disorders: Secondary | ICD-10-CM

## 2017-03-18 DIAGNOSIS — R7681 Abnormal rheumatoid factor and anti-citrullinated protein antibody without rheumatoid arthritis: Secondary | ICD-10-CM

## 2017-03-18 DIAGNOSIS — R0609 Other forms of dyspnea: Secondary | ICD-10-CM | POA: Diagnosis not present

## 2017-03-18 MED ORDER — DULOXETINE HCL 60 MG PO CPEP: 60.0000 mg | ORAL_CAPSULE | Freq: Every day | ORAL | 1 refills | Status: DC

## 2017-03-18 NOTE — Patient Instructions (Signed)
A few things to remember from today's visit:   Dyspnea, unspecified type - Plan: EKG 12-Lead  Other specified anxiety disorders - Plan: DULoxetine (CYMBALTA) 60 MG capsule  Chest tightness  Gastroesophageal reflux disease, esophagitis presence not specified  Generalized osteoarthritis of multiple sites - Plan: DULoxetine (CYMBALTA) 60 MG capsule  Cyclic citrullinated peptide (CCP) antibody positive - Plan: Ambulatory referral to Rheumatology  Cymbalta back to 60 mg.  Rest unchanged.   Please be sure medication list is accurate. If a new problem present, please set up appointment sooner than planned today.

## 2017-04-12 NOTE — Progress Notes (Signed)
Cardiology Office Note   Date:  04/13/2017   ID:  Kristina [name], DOB 04-12-1951, MRN [id number]  PCP:  Martinique, Betty G, MD  Cardiologist:   Skeet Latch, MD   Chief Complaint  Patient presents with  . New Patient (Initial Visit)  . Shortness of Breath    occasionally.  . Edema    right leg and ankle occasionally.       History of Present Illness: Kristina [name] is a 67 y.o. female with hyperlipidemia, anxiety, hypothyroidism, and COPD who presents for an evaluation of dyspnea.  Kristina [name] saw Dr. Martinique on 03/18/17 and reported exertional dyspnea and chest tightness.  She was referred to cardiology for further evaluation. For the last several years she has noted intermittent shortness of breath.  She feels like she can't get a deep breath.  It occurs both at rest and with exertion.  Dr. Martinique prescribed her an albuterol inhaler which has helped.  She walks five days per week for one hour each day.  She gets about 7K to 8K steps per day.  She notes swelling in her hands But no lower extremity edema. She notes some occasional chest tightness when walking that is approximately 2 out of 10 in severity. She denies orthopnea or PND.  Kristina [name] smoked 1-2 ppd for 25 years but quit in 1995.  She reportedly had a heart catheterization over 10 years ago that was negative for obstructive coronary disease.  Kristina [name] sometimes checks her blood pressure at home and notes that it has been as high as the 149F systolic. She reports that her diet has been poor.  She struggles with carbohydrates. She has been trying to eat more fruits and vegetables. She has also been limiting her salt intake and never adds salt to her food after it is cooked.  She notes occasional heart palpitations that occur once or twice per year. When it happens recurs intermittently for one or 2 days and then subsides. There is no associated chest pain, shortness of breath, lightheadedness, or dizziness.   Past Medical History:    Diagnosis Date  . Anxiety   . Arthritis   . Cancer ([place])    basal and squamous cell skin  . Chicken pox   . COPD (chronic obstructive pulmonary disease) ([place])   . Depression   . GERD (gastroesophageal reflux disease)   . Migraines   . Thyroid disease     Past Surgical History:  Procedure Laterality Date  . ABDOMINAL HYSTERECTOMY  1978  . CHOLECYSTECTOMY  2000  . COLONOSCOPY  2007  . UPPER GASTROINTESTINAL ENDOSCOPY  0263,[id number]     Current Outpatient Prescriptions  Medication Sig Dispense Refill  . albuterol (PROVENTIL HFA;VENTOLIN HFA) 108 (90 Base) MCG/ACT inhaler Inhale 2 puffs into the lungs every 6 (six) hours as needed for wheezing or shortness of breath. 1 Inhaler 0  . ALPRAZolam (XANAX) 0.25 MG tablet Take 1 tablet (0.25 mg total) by mouth at bedtime. 90 tablet 1  . Azelaic Acid (FINACEA) 15 % cream Apply 1 application topically 2 (two) times daily. After skin is thoroughly washed and patted dry, gently but thoroughly massage a thin film of azelaic acid cream into the affected area twice daily, in the morning and evening.    . cyclobenzaprine (FLEXERIL) 10 MG tablet TAKE 1 TABLET (10 MG TOTAL) BY MOUTH DAILY. 90 tablet 1  . diclofenac (VOLTAREN) 50 MG EC tablet Take 2 tablets (100 mg total) by mouth daily. 180 tablet  1  . DULoxetine (CYMBALTA) 60 MG capsule Take 1 capsule (60 mg total) by mouth daily. 90 capsule 1  . estradiol (CLIMARA - DOSED IN MG/24 HR) 0.075 mg/24hr patch Place 0.075 mg onto the skin once a week.    . levothyroxine (SYNTHROID, LEVOTHROID) 25 MCG tablet Take 0.5 tablets (12.5 mcg total) by mouth daily. 45 tablet 1  . Magnesium 250 MG TABS Take by mouth daily.    . meclizine (ANTIVERT) 12.5 MG tablet Take 1 tablet (12.5 mg total) by mouth 3 (three) times daily as needed for dizziness. 60 tablet 1  . Multiple Vitamin (MULTIVITAMIN) tablet Take 1 tablet by mouth daily.    Marland Kitchen omeprazole (PRILOSEC) 40 MG capsule TAKE 1 CAPSULE (40 MG TOTAL) BY MOUTH DAILY. 90  capsule 2  . vitamin B-12 (CYANOCOBALAMIN) 1000 MCG tablet Take 1,000 mcg by mouth once a week.     Current Facility-Administered Medications  Medication Dose Route Frequency Provider Last Rate Last Dose  . 0.9 %  sodium chloride infusion  500 mL Intravenous Continuous Gatha Mayer, MD        Allergies:   Meloxicam and Sulfa antibiotics    Social History:  The patient  reports that she quit smoking about 26 years ago. She has never used smokeless tobacco. She reports that she drinks alcohol. She reports that she does not use drugs.   Family History:  The patient's family history includes Arthritis in her mother; Breast cancer in her maternal aunt; CAD in her father; COPD in her mother; Diabetes in her father and mother; Heart failure in her mother; Hyperlipidemia in her father; Hypertension in her father; Stroke in her father; Throat cancer in her brother.    ROS:  Please see the history of present illness.   Otherwise, review of systems are positive for none.   All other systems are reviewed and negative.    PHYSICAL EXAM: VS:  BP 134/82   Pulse 76   Ht 5\' 2"  (1.575 m)   Wt 77.7 kg (171 lb 3.2 oz)   LMP  (LMP Unknown)   BMI 31.31 kg/m  , BMI Body mass index is 31.31 kg/m. GENERAL:  Well appearing HEENT:  Pupils equal round and reactive, fundi not visualized, oral mucosa unremarkable NECK:  No jugular venous distention, waveform within normal limits, carotid upstroke brisk and symmetric, no bruits, no thyromegaly LYMPHATICS:  No cervical adenopathy LUNGS:  Clear to auscultation bilaterally HEART:  RRR.  PMI not displaced or sustained,S1 and S2 within normal limits, no S3, no S4, no clicks, no rubs, no murmurs ABD:  Flat, positive bowel sounds normal in frequency in pitch, no bruits, no rebound, no guarding, no midline pulsatile mass, no hepatomegaly, no splenomegaly EXT:  2 plus pulses throughout, no edema, no cyanosis no clubbing SKIN:  No rashes no nodules NEURO:  Cranial  nerves II through XII grossly intact, motor grossly intact throughout PSYCH:  Cognitively intact, oriented to person place and time   EKG:  EKG is not ordered today. The ekg ordered 03/18/17 demonstrates sinus rhythm.  Rate 118 bpm.     Recent Labs: 10/05/2016: BUN 17; Creatinine, Ser 0.87; Potassium 4.0; Sodium 139    Lipid Panel    Component Value Date/Time   CHOL 205 (A) 11/25/2015   TRIG 140 11/25/2015   HDL 48 11/25/2015   LDLCALC 129 11/25/2015      Wt Readings from Last 3 Encounters:  04/13/17 77.7 kg (171 lb 3.2 oz)  03/18/17  78 kg (172 lb)  03/09/17 77.6 kg (171 lb)      ASSESSMENT AND PLAN:  # Shortness of breath:   # Atypical chest pain: Symptoms are concerning for ischemia.  I suspect it is more related to COPD given her improvement with albuterol.  We will get an ETT and echo.  If negative, she will need to titrate her COPD treatment.    # Hypertension: BP was elevated twice today and has been high at home as well.  She wants to work on diet and exercise prior to starting a medication.  She will keep a log of her BP at home and bring it to follow up.    Current medicines are reviewed at length with the patient today.  The patient does not have concerns regarding medicines.  The following changes have been made:  no change  Labs/ tests ordered today include:   Orders Placed This Encounter  Procedures  . Exercise Tolerance Test  . ECHOCARDIOGRAM COMPLETE     Disposition:   FU with [place] Agostinelli C. Oval Linsey, MD, [facility] in 6 weeks.     This note was written with the assistance of speech recognition software.  Please excuse any transcriptional errors.  Signed, Cicley Ganesh C. Oval Linsey, MD, [facility]  04/13/2017 2:08 PM    [facility]

## 2017-04-13 ENCOUNTER — Encounter: Payer: Self-pay | Admitting: Cardiovascular Disease

## 2017-04-13 ENCOUNTER — Ambulatory Visit (INDEPENDENT_AMBULATORY_CARE_PROVIDER_SITE_OTHER): Payer: Medicare Other | Admitting: Cardiovascular Disease

## 2017-04-13 VITALS — BP 134/82 | HR 76 | Ht 62.0 in | Wt 171.2 lb

## 2017-04-13 DIAGNOSIS — R0602 Shortness of breath: Secondary | ICD-10-CM | POA: Diagnosis not present

## 2017-04-13 DIAGNOSIS — I10 Essential (primary) hypertension: Secondary | ICD-10-CM

## 2017-04-13 DIAGNOSIS — R0789 Other chest pain: Secondary | ICD-10-CM

## 2017-04-13 NOTE — Patient Instructions (Addendum)
Medication Instructions:  Your physician recommends that you continue on your current medications as directed. Please refer to the Current Medication list given to you today.   Labwork: NONE  Testing/Procedures: Your physician has requested that you have an exercise tolerance test. For further information please visit [email or web address]. Please also follow instruction sheet, as given.  Your physician has requested that you have an echocardiogram. Echocardiography is a painless test that uses sound waves to create images of your heart. It provides your doctor with information about the size and shape of your heart and how well your heart's chambers and valves are working. This procedure takes approximately one hour. There are no restrictions for this procedure. [place] [address]   Follow-Up: Your physician recommends that you schedule a follow-up appointment in: 6 WEEKS   Any Other Special Instructions Will Be Listed Below (If Applicable).  MONITOR YOUR BLOOD PRESSURE AND BRING WITH YOU TO FOLLOW UP APPOINTMENT   WORK HARDER ON DIET AND EXERCISE   If you need a refill on your cardiac medications before your next appointment, please call your pharmacy.

## 2017-04-18 ENCOUNTER — Ambulatory Visit: Payer: Medicare Other | Admitting: Podiatry

## 2017-04-21 ENCOUNTER — Ambulatory Visit (HOSPITAL_COMMUNITY): Payer: Medicare Other | Attending: Cardiovascular Disease

## 2017-04-21 ENCOUNTER — Other Ambulatory Visit: Payer: Self-pay

## 2017-04-21 DIAGNOSIS — R0602 Shortness of breath: Secondary | ICD-10-CM

## 2017-04-21 DIAGNOSIS — I10 Essential (primary) hypertension: Secondary | ICD-10-CM | POA: Insufficient documentation

## 2017-04-21 DIAGNOSIS — J449 Chronic obstructive pulmonary disease, unspecified: Secondary | ICD-10-CM | POA: Diagnosis not present

## 2017-04-21 DIAGNOSIS — I081 Rheumatic disorders of both mitral and tricuspid valves: Secondary | ICD-10-CM | POA: Diagnosis not present

## 2017-04-21 DIAGNOSIS — I371 Nonrheumatic pulmonary valve insufficiency: Secondary | ICD-10-CM | POA: Diagnosis not present

## 2017-04-21 DIAGNOSIS — E785 Hyperlipidemia, unspecified: Secondary | ICD-10-CM | POA: Insufficient documentation

## 2017-04-21 DIAGNOSIS — R0789 Other chest pain: Secondary | ICD-10-CM | POA: Diagnosis not present

## 2017-04-27 ENCOUNTER — Telehealth (HOSPITAL_COMMUNITY): Payer: Self-pay

## 2017-04-27 NOTE — Telephone Encounter (Signed)
Encounter complete. 

## 2017-04-28 ENCOUNTER — Encounter: Payer: Self-pay | Admitting: Family Medicine

## 2017-04-28 ENCOUNTER — Ambulatory Visit (HOSPITAL_COMMUNITY)
Admission: RE | Admit: 2017-04-28 | Discharge: 2017-04-28 | Disposition: A | Discharge status: Home / Self Care | Payer: Medicare Other | Source: Ambulatory Visit | Attending: Cardiology | Admitting: Cardiology

## 2017-04-28 ENCOUNTER — Encounter (HOSPITAL_COMMUNITY): Payer: Self-pay | Admitting: *Deleted

## 2017-04-28 DIAGNOSIS — R0789 Other chest pain: Secondary | ICD-10-CM

## 2017-04-28 DIAGNOSIS — R0602 Shortness of breath: Secondary | ICD-10-CM | POA: Diagnosis not present

## 2017-04-28 LAB — EXERCISE TOLERANCE TEST
CHL CUP MPHR: 154 {beats}/min
CSEPED: 6 min
CSEPHR: 109 %
CSEPPHR: 169 {beats}/min
Estimated workload: 7.3 METS
Exercise duration (sec): 12 s
RPE: 17
Rest HR: 93 {beats}/min

## 2017-04-28 NOTE — Progress Notes (Signed)
Dr. Percival Spanish reviewed test and said pt. could leave.

## 2017-05-03 DIAGNOSIS — Z23 Encounter for immunization: Secondary | ICD-10-CM | POA: Diagnosis not present

## 2017-05-21 ENCOUNTER — Other Ambulatory Visit: Payer: Self-pay | Admitting: Family Medicine

## 2017-05-21 DIAGNOSIS — G4700 Insomnia, unspecified: Secondary | ICD-10-CM

## 2017-05-23 NOTE — Telephone Encounter (Signed)
Last filled 03/02/17

## 2017-05-25 NOTE — Telephone Encounter (Signed)
Last refill was 03/02/17, so Rx for Alprazolam 0.25 mg to continue once daily can be called in to be fill 05/31/17 for #90 tabs. Last Rx has a different pre scriber. Not sure if this was a mistake from her pharmacy.  Thanks, BJ

## 2017-05-27 MED ORDER — ALPRAZOLAM 0.25 MG PO TABS: 0.2500 mg | ORAL_TABLET | Freq: Every day | ORAL | 0 refills | Status: DC

## 2017-05-27 NOTE — Addendum Note (Signed)
Addended by: Kateri Mc E on: 05/27/2017 06:47 AM   Modules accepted: Orders

## 2017-06-06 NOTE — Progress Notes (Signed)
Cardiology Office Note   Date:  06/07/2017   ID:  Kristina [name], DOB Jan 03, 1951, MRN [id number]  PCP:  [name], Kristina G, MD  Cardiologist:   Skeet Latch, MD   Chief Complaint  Patient presents with  . Follow-up    6 weeks;    History of Present Illness: Kristina [name] is a 66 y.o. female with hyperlipidemia, anxiety, hypothyroidism, and COPD who presents for an evaluation of dyspnea.  Kristina [name] saw Dr. Martinique on 03/18/17 and reported exertional dyspnea and chest tightness.  She was referred to cardiology for further evaluation. For the last several years she has noted intermittent shortness of breath.  She feels like she can't get a deep breath.  It occurs both at rest and with exertion.  Dr. Martinique prescribed her an albuterol inhaler which has helped.  She walks five days per week for one hour each day.  She gets about 7K to 8K steps per day.  She notes swelling in her hands But no lower extremity edema. She notes some occasional chest tightness when walking that is approximately 2 out of 10 in severity. She denies orthopnea or PND.  Kristina [name] smoked 1-2 ppd for 25 years but quit in 1995.  She reportedly had a heart catheterization over 10 years ago that was negative for obstructive coronary disease.  At her last appointment [name] reported atypical chest pain and exertional dyspnea.  She was referred for an ETT 04/28/17 that was negative for ischemia.  She achieved 7.3 METs on the Bruce protocol.  She also had an echo 04/21/17 that revealed  LVEF 55-60% with normal diastolic function and mild mitral regurgitation.  At her last appointment it was noted that her blood pressure was poorly-controlled.  It was also poorly controlled at home.  However she wanted to work on diet and exercise before changing medications.  Since her last appointment Kristina [name] has been trying to increase her exercise.  Until last week she was walking daily.  However, she developed a stress fracture in her L foot  she denies any chest pain with exercise.that has slowed her down.  Her breathing has been stable.  She has not noted any lower extremity edema, orthopnea, or PND.  She has been checking her blood pressure at home and it has ranged in the 130s-140s over 70s-80s.   Past Medical History:  Diagnosis Date  . Anxiety   . Arthritis   . Cancer ([place])    basal and squamous cell skin  . Chicken pox   . COPD (chronic obstructive pulmonary disease) ([place])   . Depression   . GERD (gastroesophageal reflux disease)   . Migraines   . Thyroid disease     Past Surgical History:  Procedure Laterality Date  . ABDOMINAL HYSTERECTOMY  1978  . CHOLECYSTECTOMY  2000  . COLONOSCOPY  2007  . UPPER GASTROINTESTINAL ENDOSCOPY  [id number]     Current Outpatient Prescriptions  Medication Sig Dispense Refill  . albuterol (PROVENTIL HFA;VENTOLIN HFA) 108 (90 Base) MCG/ACT inhaler Inhale 2 puffs into the lungs every 6 (six) hours as needed for wheezing or shortness of breath. 1 Inhaler 0  . ALPRAZolam (XANAX) 0.25 MG tablet Take 1 tablet (0.25 mg total) by mouth at bedtime. 90 tablet 0  . Azelaic Acid (FINACEA) 15 % cream Apply 1 application topically 2 (two) times daily. After skin is thoroughly washed and patted dry, gently but thoroughly massage a thin film of azelaic acid cream into the affected  area twice daily, in the morning and evening.    . cyclobenzaprine (FLEXERIL) 10 MG tablet TAKE 1 TABLET EVERY DAY 90 tablet 1  . diclofenac (VOLTAREN) 50 MG EC tablet Take 2 tablets (100 mg total) by mouth daily. 180 tablet 1  . DULoxetine (CYMBALTA) 60 MG capsule Take 1 capsule (60 mg total) by mouth daily. 90 capsule 1  . estradiol (CLIMARA - DOSED IN MG/24 HR) 0.075 mg/24hr patch Place 0.075 mg onto the skin once a week.    . levothyroxine (SYNTHROID, LEVOTHROID) 25 MCG tablet Take 0.5 tablets (12.5 mcg total) by mouth daily. 45 tablet 1  . Magnesium 250 MG TABS Take by mouth daily.    . meclizine (ANTIVERT) 12.5 MG  tablet Take 1 tablet (12.5 mg total) by mouth 3 (three) times daily as needed for dizziness. 60 tablet 1  . Multiple Vitamin (MULTIVITAMIN) tablet Take 1 tablet by mouth daily.    Marland Kitchen omeprazole (PRILOSEC) 40 MG capsule TAKE 1 CAPSULE (40 MG TOTAL) BY MOUTH DAILY. 90 capsule 2  . vitamin B-12 (CYANOCOBALAMIN) 1000 MCG tablet Take 1,000 mcg by mouth once a week.    . losartan (COZAAR) 50 MG tablet Take 1 tablet (50 mg total) by mouth daily. 90 tablet 1   Current Facility-Administered Medications  Medication Dose Route Frequency Provider Last Rate Last Dose  . 0.9 %  sodium chloride infusion  500 mL Intravenous Continuous Gatha Mayer, MD        Allergies:   Meloxicam and Sulfa antibiotics    Social History:  The patient  reports that she quit smoking about 26 years ago. She has never used smokeless tobacco. She reports that she drinks alcohol. She reports that she does not use drugs.   Family History:  The patient's family history includes Arthritis in her mother; Breast cancer in her maternal aunt; CAD in her father; COPD in her mother; Diabetes in her father and mother; Heart failure in her mother; Hyperlipidemia in her father; Hypertension in her father; Stroke in her father; Throat cancer in her brother.    ROS:  Please see the history of present illness.   Otherwise, review of systems are positive for none.   All other systems are reviewed and negative.    PHYSICAL EXAM: VS:  BP (!) 153/83   Pulse 89   Ht 5\' 2"  (1.575 m)   Wt 78.6 kg (173 lb 3.2 oz)   LMP  (LMP Unknown)   BMI 31.68 kg/m  , BMI Body mass index is 31.68 kg/m. GENERAL:  Well appearing.  No acute distress.  HEENT: Pupils equal round and reactive, fundi not visualized, oral mucosa unremarkable NECK:  No jugular venous distention, waveform within normal limits, carotid upstroke brisk and symmetric, no bruits, no thyromegaly LYMPHATICS:  No cervical adenopathy LUNGS:  Clear to auscultation bilaterally.  No crackles,  wheezes or rhonchi  HEART:  RRR.  PMI not displaced or sustained,S1 and S2 within normal limits, no S3, no S4, no clicks, no rubs, no murmurs ABD:  Flat, positive bowel sounds normal in frequency in pitch, no bruits, no rebound, no guarding, no midline pulsatile mass, no hepatomegaly, no splenomegaly EXT:  2 plus pulses throughout, no edema, no cyanosis no clubbing SKIN:  No rashes no nodules NEURO:  Cranial nerves II through XII grossly intact, motor grossly intact throughout PSYCH:  Cognitively intact, oriented to person place and time    EKG:  EKG is not ordered today. The ekg ordered 03/18/17  demonstrates sinus rhythm.  Rate 118 bpm.    ETT 04/28/17:  There was no ST segment deviation noted during stress.   No ischemia on ECG. Normal BP response to exercise.  Normal exercise capacity.   Echo 04/21/17: Study Conclusions  - Left ventricle: The cavity size was normal. Systolic function was   normal. The estimated ejection fraction was in the range of 55%   to 60%. Wall motion was normal; there were no regional wall   motion abnormalities. Left ventricular diastolic function   parameters were normal. - Mitral valve: There was mild regurgitation. - Atrial septum: No defect or patent foramen ovale was identified. - Pulmonary arteries: PA peak pressure: 31 mm Hg (S).    Recent Labs: 10/05/2016: BUN 17; Creatinine, Ser 0.87; Potassium 4.0; Sodium 139    Lipid Panel    Component Value Date/Time   CHOL 205 (A) 11/25/2015   TRIG 140 11/25/2015   HDL 48 11/25/2015   LDLCALC 129 11/25/2015      Wt Readings from Last 3 Encounters:  06/07/17 78.6 kg (173 lb 3.2 oz)  04/13/17 77.7 kg (171 lb 3.2 oz)  03/18/17 78 kg (172 lb)      ASSESSMENT AND PLAN:  # Shortness of breath:   # Atypical chest pain: Resolved.  ETT was negative and echo was unremarkable.  Continue with exercise regimen.  # Hypertension: BP remains elevated after trying diet and exercise.  We will start  losartan 50 mg daily.  She will return to our [place] office next week to have a basic metabolic panel checked.  She will continue to track her blood pressures.      Current medicines are reviewed at length with the patient today.  The patient does not have concerns regarding medicines.  The following changes have been made:  no change  Labs/ tests ordered today include:   Orders Placed This Encounter  Procedures  . Basic metabolic panel     Disposition:   FU with Avelino Herren C. Oval Linsey, MD, [facility] in 2 months.    This note was written with the assistance of speech recognition software.  Please excuse any transcriptional errors.  Signed, Shneur Whitten[place] C. Oval Linsey, MD, [facility]  06/07/2017 12:37 PM    [place]

## 2017-06-07 ENCOUNTER — Ambulatory Visit (INDEPENDENT_AMBULATORY_CARE_PROVIDER_SITE_OTHER): Payer: Medicare Other | Admitting: Cardiovascular Disease

## 2017-06-07 ENCOUNTER — Encounter: Payer: Self-pay | Admitting: Cardiovascular Disease

## 2017-06-07 VITALS — BP 153/83 | HR 89 | Ht 62.0 in | Wt 173.2 lb

## 2017-06-07 DIAGNOSIS — Z5181 Encounter for therapeutic drug level monitoring: Secondary | ICD-10-CM

## 2017-06-07 DIAGNOSIS — I10 Essential (primary) hypertension: Secondary | ICD-10-CM

## 2017-06-07 MED ORDER — LOSARTAN POTASSIUM 50 MG PO TABS: 50.0000 mg | ORAL_TABLET | Freq: Every day | ORAL | 1 refills | Status: DC

## 2017-06-07 MED ORDER — LOSARTAN POTASSIUM 50 MG PO TABS: 50.0000 mg | ORAL_TABLET | Freq: Every day | ORAL | 5 refills | Status: DC

## 2017-06-07 NOTE — Patient Instructions (Addendum)
Medication Instructions:  START LOSARTAN 50 MG DAILY   Labwork: BMET IN 1 WEEK  [address], [facility], [place], [place], [place] [id number] [phone]  Testing/Procedures: NONE  Follow-Up: Your physician recommends that you schedule a follow-up appointment in: 2 MONTH OV   Any Other Special Instructions Will Be Listed Below (If Applicable).     If you need a refill on your cardiac medications before your next appointment, please call your pharmacy.

## 2017-06-15 DIAGNOSIS — Z5181 Encounter for therapeutic drug level monitoring: Secondary | ICD-10-CM | POA: Diagnosis not present

## 2017-06-16 LAB — BASIC METABOLIC PANEL
BUN / CREAT RATIO: 10 — AB (ref 12–28)
BUN: 9 mg/dL (ref 8–27)
CALCIUM: 9.1 mg/dL (ref 8.7–10.3)
CHLORIDE: 103 mmol/L (ref 96–106)
CO2: 25 mmol/L (ref 20–29)
Creatinine, Ser: 0.89 mg/dL (ref 0.57–1.00)
GFR calc non Af Amer: 67 mL/min/{1.73_m2} (ref >59)
GFR, EST AFRICAN AMERICAN: 78 mL/min/{1.73_m2} (ref >59)
Glucose: 120 mg/dL — ABNORMAL HIGH (ref 65–99)
POTASSIUM: 4.3 mmol/L (ref 3.5–5.2)
SODIUM: 140 mmol/L (ref 134–144)

## 2017-06-20 DIAGNOSIS — L821 Other seborrheic keratosis: Secondary | ICD-10-CM | POA: Diagnosis not present

## 2017-06-20 DIAGNOSIS — D225 Melanocytic nevi of trunk: Secondary | ICD-10-CM | POA: Diagnosis not present

## 2017-06-20 DIAGNOSIS — L309 Dermatitis, unspecified: Secondary | ICD-10-CM | POA: Diagnosis not present

## 2017-06-20 DIAGNOSIS — Z85828 Personal history of other malignant neoplasm of skin: Secondary | ICD-10-CM | POA: Diagnosis not present

## 2017-06-20 DIAGNOSIS — D1801 Hemangioma of skin and subcutaneous tissue: Secondary | ICD-10-CM | POA: Diagnosis not present

## 2017-06-30 ENCOUNTER — Encounter: Payer: Self-pay | Admitting: Family Medicine

## 2017-07-04 ENCOUNTER — Other Ambulatory Visit: Payer: Self-pay | Admitting: Family Medicine

## 2017-07-04 DIAGNOSIS — E041 Nontoxic single thyroid nodule: Secondary | ICD-10-CM | POA: Insufficient documentation

## 2017-07-11 ENCOUNTER — Other Ambulatory Visit: Payer: Medicare Other

## 2017-07-14 ENCOUNTER — Ambulatory Visit
Admission: RE | Admit: 2017-07-14 | Discharge: 2017-07-14 | Disposition: A | Discharge status: Home / Self Care | Payer: Medicare Other | Source: Ambulatory Visit | Attending: Family Medicine | Admitting: Family Medicine

## 2017-07-14 DIAGNOSIS — E041 Nontoxic single thyroid nodule: Secondary | ICD-10-CM

## 2017-07-14 DIAGNOSIS — E042 Nontoxic multinodular goiter: Secondary | ICD-10-CM | POA: Diagnosis not present

## 2017-07-18 ENCOUNTER — Ambulatory Visit: Payer: Medicare Other | Admitting: Podiatry

## 2017-07-21 ENCOUNTER — Encounter: Payer: Self-pay | Admitting: Family Medicine

## 2017-07-25 ENCOUNTER — Ambulatory Visit (INDEPENDENT_AMBULATORY_CARE_PROVIDER_SITE_OTHER): Payer: Medicare Other | Admitting: Podiatry

## 2017-07-25 ENCOUNTER — Ambulatory Visit (INDEPENDENT_AMBULATORY_CARE_PROVIDER_SITE_OTHER): Payer: Medicare Other

## 2017-07-25 ENCOUNTER — Encounter: Payer: Self-pay | Admitting: Podiatry

## 2017-07-25 DIAGNOSIS — M84375S Stress fracture, left foot, sequela: Secondary | ICD-10-CM | POA: Diagnosis not present

## 2017-07-25 DIAGNOSIS — M205X1 Other deformities of toe(s) (acquired), right foot: Secondary | ICD-10-CM

## 2017-07-25 NOTE — Progress Notes (Signed)
She presents today chief complaint of a painful first metatarsal phalangeal joint that she has been getting shots and for many years.  She states the shots and got to the point where they no longer help.  She states that the toe still moves but it just hurts when he moves.  She states that she has had a history of multiple fractures in the left foot.  These have been stress fractures.  Objective: Vital signs are stable she is alert and oriented x3 mild hallux valgus deformity of the right foot with hallux limitus and pain on palpation of the first metatarsophalangeal joint.  Pulses are palpable neurologic sensorium is intact.  Left foot demonstrates nonpalpable pulses appears to be some nodularity overlying the lesser metatarsals particularly the second and third.  Radiographs taken today demonstrate hallux limitus first metatarsophalangeal joint with central joint space narrowing subchondral sclerosis and dorsal eburnation.  Left foot demonstrates stress fractures which are healing to the second into the third metatarsals.  No fractures to the fourth or fifth metatarsals are visualized.  No other fractures are noted in the foot.  Assessment: Severe hallux limitus and pain on range of motion of the first metatarsophalangeal joint of the right foot.  Stress fractures of the left foot which have resolved.  Plan: After thorough discussion today we consented her for a Keller arthroplasty with a single silicone implant first metatarsophalangeal joint right foot.  I answered all the questions regarding this procedure to the best of my ability in layman's terms.  She understands and is amenable to it signed all 3 patient consent form and I will follow-up with her in the near future.  Should there be questions or concerns she will notify us immediately.  She states that she has a Cam walker and will bring it with her on the day of surgery.  She was provided with both oral and written home-going instructions for  contacting the surgery center as well as a representative for the surgery center and providing her history and physical by the surgical center.  We also discussed anesthesia she understands this and is amenable to follow-up with me in the near future.

## 2017-07-25 NOTE — Progress Notes (Addendum)
HPI:   [name] is a 66 y.o. female, who is here today to follow on some chronic medical problems.  She was last seen on March 18, 2017, at that time she was complaining of exertional dyspnea.  Since her last visit she has seen a cardiologist, Dr. Oval Linsey (June 07, 2017).  History of generalized osteoarthritis and back pain, she is following with orthopedist. IP, MCP of both hands, shoulders, and lower back. IP joint pain has been worse for the past few days, "bad", no erythema or edema, R>L. Exacerbated by movement, no limitations. Alleviated by rest.  Last office visit she was referred to rheumatologist.  She is currently on Cymbalta 60 mg daily.   She also takes Diclofenac 100 mg daily as needed and Flexeril 10 mg daily as needed.  History of thyroid nodule/goiter, she is currently on Levothyroxine 12.5 mcg daily.   Thyroid US on 07/14/2017: No significant change.Nodules 2 and 3 continue to meet criteria for annual follow-up.  Lab Results  Component Value Date   TSH 1.68 03/30/2016    Anxiety: She is on Cymbalta 60 mg on Alprazolam 0.25 mg daily at bedtime. She denies depressed mood or suicidal thoughts.  Hypertension:  Currently on Cozaar 50 mg daily. BP readings 130-140's/80's, a few SBP's 150's.  She is taking medications as instructed, no side effects reported.  She has not noted headache, visual changes, worsening exertional chest pain, dyspnea,  focal weakness, or edema.   Lab Results  Component Value Date   CREATININE 0.89 06/15/2017   BUN 9 06/15/2017   NA 140 06/15/2017   K 4.3 06/15/2017   CL 103 06/15/2017   CO2 25 06/15/2017   Tremor: Intermittent hand tremor, she follows with neurologist a few months ago, Dr Tat.   It is otherwise stable, aggravated with some fine motor activities and sometimes having trouble writing.  Hyperlipidemia:  Currently she is on nonpharmacologic treatment. She tries to follow low-fat diet.  Lab  Results  Component Value Date   CHOL 205 (A) 11/25/2015   HDL 48 11/25/2015   LDLCALC 129 11/25/2015   TRIG 140 11/25/2015    She is not exercising regularly due to recent toe fracture, she is currently following with podiatrist.  Review of Systems  Constitutional: Positive for fatigue (no more than usual). Negative for activity change, appetite change and fever.  HENT: Negative for mouth sores, nosebleeds and trouble swallowing.   Eyes: Negative for redness and visual disturbance.  Respiratory: Negative for cough, shortness of breath and wheezing.   Cardiovascular: Negative for chest pain, palpitations and leg swelling.  Gastrointestinal: Negative for abdominal pain, nausea and vomiting.       Negative for changes in bowel habits.  Endocrine: Negative for cold intolerance and heat intolerance.  Genitourinary: Negative for decreased urine volume, dysuria and hematuria.  Musculoskeletal: Positive for arthralgias. Negative for gait problem and joint swelling.  Skin: Negative for pallor and rash.  Neurological: Positive for tremors. Negative for seizures, syncope, weakness and headaches.  Psychiatric/Behavioral: Negative for confusion. The patient is nervous/anxious.       Current Outpatient Medications on File Prior to Visit  Medication Sig Dispense Refill  . albuterol (PROVENTIL HFA;VENTOLIN HFA) 108 (90 Base) MCG/ACT inhaler Inhale 2 puffs into the lungs every 6 (six) hours as needed for wheezing or shortness of breath. 1 Inhaler 0  . ALPRAZolam (XANAX) 0.25 MG tablet Take 1 tablet (0.25 mg total) by mouth at bedtime. 90 tablet 0  .  Azelaic Acid (FINACEA) 15 % cream Apply 1 application topically 2 (two) times daily. After skin is thoroughly washed and patted dry, gently but thoroughly massage a thin film of azelaic acid cream into the affected area twice daily, in the morning and evening.    . cyclobenzaprine (FLEXERIL) 10 MG tablet TAKE 1 TABLET EVERY DAY 90 tablet 1  . diclofenac  (VOLTAREN) 50 MG EC tablet Take 2 tablets (100 mg total) by mouth daily. 180 tablet 1  . levothyroxine (SYNTHROID, LEVOTHROID) 25 MCG tablet Take 0.5 tablets (12.5 mcg total) by mouth daily. 45 tablet 1  . losartan (COZAAR) 50 MG tablet Take 1 tablet (50 mg total) by mouth daily. 90 tablet 1  . Magnesium 250 MG TABS Take by mouth daily.    . meclizine (ANTIVERT) 12.5 MG tablet Take 1 tablet (12.5 mg total) by mouth 3 (three) times daily as needed for dizziness. 60 tablet 1  . Multiple Vitamin (MULTIVITAMIN) tablet Take 1 tablet by mouth daily.    Marland Kitchen omeprazole (PRILOSEC) 40 MG capsule TAKE 1 CAPSULE (40 MG TOTAL) BY MOUTH DAILY. 90 capsule 2  . vitamin B-12 (CYANOCOBALAMIN) 1000 MCG tablet Take 1,000 mcg by mouth once a week.    . propranolol (INDERAL) 40 MG tablet Take 1 tablet (40 mg total) by mouth 2 (two) times daily. 60 tablet 5   Current Facility-Administered Medications on File Prior to Visit  Medication Dose Route Frequency Provider Last Rate Last Dose  . 0.9 %  sodium chloride infusion  500 mL Intravenous Continuous Gatha Mayer, MD         Past Medical History:  Diagnosis Date  . Anxiety   . Arthritis   . Cancer ([place])    basal and squamous cell skin  . Chicken pox   . COPD (chronic obstructive pulmonary disease) ([place])   . Depression   . GERD (gastroesophageal reflux disease)   . Migraines   . Thyroid disease    Allergies  Allergen Reactions  . Meloxicam Other (See Comments)    "skin infection on leg"  . Sulfa Antibiotics Rash    Rash all over body/fever    Social History   Socioeconomic History  . Marital status: Single    Spouse name: None  . Number of children: None  . Years of education: None  . Highest education level: None  Social Needs  . Financial resource strain: None  . Food insecurity - worry: None  . Food insecurity - inability: None  . Transportation needs - medical: None  . Transportation needs - non-medical: None  Occupational History  .  Occupation: retired    Comment: administration  Tobacco Use  . Smoking status: Former Smoker    Last attempt to quit: 11/02/1990    Years since quitting: 26.7  . Smokeless tobacco: Never Used  Substance and Sexual Activity  . Alcohol use: Yes    Comment: glass of wine once a month  . Drug use: No  . Sexual activity: No  Other Topics Concern  . None  Social History Narrative  . None    Vitals:   07/26/17 0942  BP: 140/77  Pulse: 92  Temp: 98.1 F (36.7 C)  SpO2: 96%   Body mass index is 31.68 kg/m.   Physical Exam  Nursing note and vitals reviewed. Constitutional: She is oriented to person, place, and time. She appears well-developed. No distress.  HENT:  Head: Normocephalic and atraumatic.  Mouth/Throat: Oropharynx is clear and moist and  mucous membranes are normal.  Eyes: Conjunctivae are normal. Pupils are equal, round, and reactive to light.  Neck: No tracheal deviation present. No thyroid mass present.  Cardiovascular: Normal rate and regular rhythm.  No murmur heard. Pulses:      Radial pulses are 2+ on the right side, and 2+ on the left side.       Dorsalis pedis pulses are 2+ on the right side, and 2+ on the left side.  Respiratory: Effort normal and breath sounds normal. No respiratory distress.  GI: Soft. She exhibits no mass. There is no hepatomegaly. There is no tenderness.  Musculoskeletal: She exhibits no edema.  No signs of synovitis or major deformity appreciated. Mild pain upon palpation/ROM of some joints. Mild limitation of flexion wrists,bilateral.  Lymphadenopathy:    She has no cervical adenopathy.  Neurological: She is alert and oriented to person, place, and time. She has normal strength. She displays tremor. Gait normal.  Mild hand tremor, not present at rest.  Skin: Skin is warm. No erythema.  Psychiatric: Her mood appears anxious.  Well groomed, good eye contact.    ASSESSMENT AND PLAN:   Ms. Belma was seen today for medication  follow-up.  Diagnoses and all orders for this visit:  Benign essential tremor  Educated about diagnosis and prognosis. We also discussed a few pharmacologic treatment options, including BB's. She prefers to hold on pharmacologic treatment.  Generalized osteoarthritis of multiple sites  Stable overall. Natural history of OA and pulmonologist. We discussed some side effects of chronic NSAIDs use. No changes in current management.  -     DULoxetine (CYMBALTA) 60 MG capsule; Take 1 capsule (60 mg total) by mouth daily.  Essential hypertension  Not well controlled. Possible complications of elevated BP discussed. Options discussed, including increasing dose of Cozaar, adding a thiazide, or adding propranolol. Given her history of essential tremor, I think she would benefit from Propranolol.  She prefers to hold on management changes until she sees her cardiologist.  To new monitoring BP. F/U in 5 months.  Other specified anxiety disorders  Overall stable. No changes in current management.  She does not need refills of Alprazolam at this time, she will let me know when she needs Rx. Follow-up in 5 months.  -     DULoxetine (CYMBALTA) 60 MG capsule; Take 1 capsule (60 mg total) by mouth daily.  Goiter, non-toxic  Stable. Thyroid US to be repeated in 1 year. No changes in Levothyroxine dose.  -     TSH; Future  Hyperlipidemia, unspecified hyperlipidemia type  Continue nonpharmacologic treatment. Fasting labs to be done in 1-3 weeks.  -     Lipid panel; Future  Encounter for HCV screening test for high risk patient -     Hepatitis C antibody; Future    -Ms. Maddyx Vallie was advised to return sooner than planned today if new concerns arise.       Rhilynn Preyer G. Martinique, MD  [facility]. [place] office.

## 2017-07-25 NOTE — Patient Instructions (Signed)
Pre-Operative Instructions  Congratulations, you have decided to take an important step towards improving your quality of life.  You can be assured that the doctors and staff at [facility] will be with you every step of the way.  Here are some important things you should know:  1. Plan to be at the surgery center/hospital at least 1 (one) hour prior to your scheduled time, unless otherwise directed by the surgical center/hospital staff.  You must have a responsible adult accompany you, remain during the surgery and drive you home.  Make sure you have directions to the surgical center/hospital to ensure you arrive on time. 2. If you are having surgery at [facility] or [facility] hospitals, you will need a copy of your medical history and physical form from your family physician within one month prior to the date of surgery. We will give you a form for your primary physician to complete.  3. We make every effort to accommodate the date you request for surgery.  However, there are times where surgery dates or times have to be moved.  We will contact you as soon as possible if a change in schedule is required.   4. No aspirin/ibuprofen for one week before surgery.  If you are on aspirin, any non-steroidal anti-inflammatory medications (Mobic, Aleve, Ibuprofen) should not be taken seven (7) days prior to your surgery.  You make take Tylenol for pain prior to surgery.  5. Medications - If you are taking daily heart and blood pressure medications, seizure, reflux, allergy, asthma, anxiety, pain or diabetes medications, make sure you notify the surgery center/hospital before the day of surgery so they can tell you which medications you should take or avoid the day of surgery. 6. No food or drink after midnight the night before surgery unless directed otherwise by surgical center/hospital staff. 7. No alcoholic beverages 24-hours prior to surgery.  No smoking 24-hours prior or 24-hours after  surgery. 8. Wear loose pants or shorts. They should be loose enough to fit over bandages, boots, and casts. 9. Don't wear slip-on shoes. Sneakers are preferred. 10. Bring your boot with you to the surgery center/hospital.  Also bring crutches or a walker if your physician has prescribed it for you.  If you do not have this equipment, it will be provided for you after surgery. 11. If you have not been contacted by the surgery center/hospital by the day before your surgery, call to confirm the date and time of your surgery. 12. Leave-time from work may vary depending on the type of surgery you have.  Appropriate arrangements should be made prior to surgery with your employer. 13. Prescriptions will be provided immediately following surgery by your doctor.  Fill these as soon as possible after surgery and take the medication as directed. Pain medications will not be refilled on weekends and must be approved by the doctor. 14. Remove nail polish on the operative foot and avoid getting pedicures prior to surgery. 15. Wash the night before surgery.  The night before surgery wash the foot and leg well with water and the antibacterial soap provided. Be sure to pay special attention to beneath the toenails and in between the toes.  Wash for at least three (3) minutes. Rinse thoroughly with water and dry well with a towel.  Perform this wash unless told not to do so by your physician.  Enclosed: 1 Ice pack (please put in freezer the night before surgery)   1 Hibiclens skin cleaner     Pre-op instructions  If you have any questions regarding the instructions, please do not hesitate to call our office.  [place]: [address], [place], [place] [address] -- [phone]  [place]: [address] [place], [place] [address] -- [phone]  [place]: [address]  [place], [place] [address] -- [phone]  [place]: [address], [address], [place], [place] [address] -- [phone]  Website:  [email or web address] 

## 2017-07-26 ENCOUNTER — Encounter: Payer: Self-pay | Admitting: Family Medicine

## 2017-07-26 ENCOUNTER — Ambulatory Visit (INDEPENDENT_AMBULATORY_CARE_PROVIDER_SITE_OTHER): Payer: Medicare Other | Admitting: Cardiovascular Disease

## 2017-07-26 ENCOUNTER — Encounter: Payer: Self-pay | Admitting: Cardiovascular Disease

## 2017-07-26 ENCOUNTER — Ambulatory Visit (INDEPENDENT_AMBULATORY_CARE_PROVIDER_SITE_OTHER): Payer: Medicare Other | Admitting: Family Medicine

## 2017-07-26 VITALS — BP 142/88 | HR 85 | Ht 62.0 in | Wt 172.0 lb

## 2017-07-26 VITALS — BP 140/77 | HR 92 | Temp 98.1°F | Ht 62.0 in | Wt 173.2 lb

## 2017-07-26 DIAGNOSIS — Z9189 Other specified personal risk factors, not elsewhere classified: Secondary | ICD-10-CM | POA: Diagnosis not present

## 2017-07-26 DIAGNOSIS — F418 Other specified anxiety disorders: Secondary | ICD-10-CM | POA: Diagnosis not present

## 2017-07-26 DIAGNOSIS — M159 Polyosteoarthritis, unspecified: Secondary | ICD-10-CM | POA: Diagnosis not present

## 2017-07-26 DIAGNOSIS — I10 Essential (primary) hypertension: Secondary | ICD-10-CM

## 2017-07-26 DIAGNOSIS — E785 Hyperlipidemia, unspecified: Secondary | ICD-10-CM | POA: Diagnosis not present

## 2017-07-26 DIAGNOSIS — R0602 Shortness of breath: Secondary | ICD-10-CM

## 2017-07-26 DIAGNOSIS — Z1159 Encounter for screening for other viral diseases: Secondary | ICD-10-CM

## 2017-07-26 DIAGNOSIS — G250 Essential tremor: Secondary | ICD-10-CM | POA: Diagnosis not present

## 2017-07-26 DIAGNOSIS — E049 Nontoxic goiter, unspecified: Secondary | ICD-10-CM | POA: Diagnosis not present

## 2017-07-26 MED ORDER — PROPRANOLOL HCL 40 MG PO TABS: 40.0000 mg | ORAL_TABLET | Freq: Two times a day (BID) | ORAL | 5 refills | Status: DC

## 2017-07-26 MED ORDER — DULOXETINE HCL 60 MG PO CPEP: 60.0000 mg | ORAL_CAPSULE | Freq: Every day | ORAL | 2 refills | Status: DC

## 2017-07-26 NOTE — Patient Instructions (Addendum)
Medication Instructions:  START PROPRANOLOL 40 MG TWICE A DAY  IF YOUR BLOOD PRESSURE STAYS ABOVE 130/80 INCREASE TO 80 MG TWICE A DAY   Labwork: NONE  Testing/Procedures: NONE  Follow-Up: Your physician recommends that you schedule a follow-up appointment in: 2 MONTH OV  CALL THE OFFICE WHEN YOU NEED A REFILL AND WILL SEND TO YOUR MAIL ORDER   If you need a refill on your cardiac medications before your next appointment, please call your pharmacy.

## 2017-07-26 NOTE — Progress Notes (Signed)
Cardiology Office Note   Date:  07/26/2017   ID:  Kristina [name], DOB [birth year], MRN [id number]  PCP:  [name], Kristina G, MD  Cardiologist:   Skeet Latch, MD   No chief complaint on file.   History of Present Illness: Kristina [name] is a 66 y.o. female with hypertension, hyperlipidemia, anxiety, hypothyroidism, and COPD who presents for follow up.  She was initially seen 04/2017 for an evaluation of dyspnea.  Kristina [name] saw Dr. Martinique on 03/18/17 and reported exertional dyspnea and chest tightness.  She was referred to cardiology for further evaluation. For the last several years she has noted intermittent shortness of breath.  She feels like she can't get a deep breath.  It occurs both at rest and with exertion.  Dr. Martinique prescribed her an albuterol inhaler which has helped. She noted some occasional chest tightness when walking that is approximately 2 out of 10 in severity. She denies orthopnea or PND.  Kristina [name] smoked 1-2 ppd for 25 years but quit in 1995.  She reportedly had a heart catheterization over 10 years ago that was negative for obstructive coronary disease.  She was referred for an ETT 04/28/17 that was negative for ischemia.  She achieved 7.3 METs on the Bruce protocol.  She also had an echo 04/21/17 that revealed  LVEF 55-60% with normal diastolic function and mild mitral regurgitation.  At her last appointment Kristina [name]'s blood pressure was elevated despite working on diet and exercise.  She was started on losartan.  She brings a log of her blood pressure showing that they have ranged from the 120s to the 140s and are mostly in the 130s.  She has not been able to walk as much lately.  Every time she tries to walk consistently she ends up with fractures in her toes.  She has not noted any chest pain or exertional dyspnea.  She continues to have some episodes of feeling like she cannot catch a deep breath.  These have been attributed to anxiety.  She saw Dr. Martinique today who  suggested that she use propranolol to help for both tremor, anxiety, and blood pressure.  [name] denies lower extremity edema, orthopnea, or PND.   Past Medical History:  Diagnosis Date  . Anxiety   . Arthritis   . Cancer ([place])    basal and squamous cell skin  . Chicken pox   . COPD (chronic obstructive pulmonary disease) ([place])   . Depression   . GERD (gastroesophageal reflux disease)   . Migraines   . Thyroid disease     Past Surgical History:  Procedure Laterality Date  . ABDOMINAL HYSTERECTOMY  1978  . CHOLECYSTECTOMY  2000  . COLONOSCOPY  2007  . UPPER GASTROINTESTINAL ENDOSCOPY  [id number]     Current Outpatient Medications  Medication Sig Dispense Refill  . albuterol (PROVENTIL HFA;VENTOLIN HFA) 108 (90 Base) MCG/ACT inhaler Inhale 2 puffs into the lungs every 6 (six) hours as needed for wheezing or shortness of breath. 1 Inhaler 0  . ALPRAZolam (XANAX) 0.25 MG tablet Take 1 tablet (0.25 mg total) by mouth at bedtime. 90 tablet 0  . Azelaic Acid (FINACEA) 15 % cream Apply 1 application topically 2 (two) times daily. After skin is thoroughly washed and patted dry, gently but thoroughly massage a thin film of azelaic acid cream into the affected area twice daily, in the morning and evening.    . cyclobenzaprine (FLEXERIL) 10 MG tablet TAKE 1 TABLET EVERY DAY 90  tablet 1  . diclofenac (VOLTAREN) 50 MG EC tablet Take 2 tablets (100 mg total) by mouth daily. 180 tablet 1  . DULoxetine (CYMBALTA) 60 MG capsule Take 1 capsule (60 mg total) by mouth daily. 90 capsule 2  . levothyroxine (SYNTHROID, LEVOTHROID) 25 MCG tablet Take 0.5 tablets (12.5 mcg total) by mouth daily. 45 tablet 1  . losartan (COZAAR) 50 MG tablet Take 1 tablet (50 mg total) by mouth daily. 90 tablet 1  . Magnesium 250 MG TABS Take by mouth daily.    . meclizine (ANTIVERT) 12.5 MG tablet Take 1 tablet (12.5 mg total) by mouth 3 (three) times daily as needed for dizziness. 60 tablet 1  . Multiple Vitamin  (MULTIVITAMIN) tablet Take 1 tablet by mouth daily.    Marland Kitchen omeprazole (PRILOSEC) 40 MG capsule TAKE 1 CAPSULE (40 MG TOTAL) BY MOUTH DAILY. 90 capsule 2  . vitamin B-12 (CYANOCOBALAMIN) 1000 MCG tablet Take 1,000 mcg by mouth once a week.    . propranolol (INDERAL) 40 MG tablet Take 1 tablet (40 mg total) by mouth 2 (two) times daily. 60 tablet 5   Current Facility-Administered Medications  Medication Dose Route Frequency Provider Last Rate Last Dose  . 0.9 %  sodium chloride infusion  500 mL Intravenous Continuous Kristina Mayer, MD        Allergies:   Meloxicam and Sulfa antibiotics    Social History:  The patient  reports that she quit smoking about 26 years ago. she has never used smokeless tobacco. She reports that she drinks alcohol. She reports that she does not use drugs.   Family History:  The patient's family history includes Arthritis in her mother; Breast cancer in her maternal aunt; CAD in her father; COPD in her mother; Diabetes in her father and mother; Heart failure in her mother; Hyperlipidemia in her father; Hypertension in her father; Stroke in her father; Throat cancer in her brother.    ROS:  Please see the history of present illness.   Otherwise, review of systems are positive for none.   All other systems are reviewed and negative.    PHYSICAL EXAM: VS:  BP (!) 142/88   Pulse 85   Ht 5\' 2"  (1.575 m)   Wt 172 lb (78 kg)   LMP  (LMP Unknown)   BMI 31.46 kg/m  , BMI Body mass index is 31.46 kg/m. GENERAL:  Well appearing HEENT: Pupils equal round and reactive, fundi not visualized, oral mucosa unremarkable NECK:  No jugular venous distention, waveform within normal limits, carotid upstroke brisk and symmetric, no bruits, no thyromegaly LUNGS:  Clear to auscultation bilaterally HEART:  RRR.  PMI not displaced or sustained,S1 and S2 within normal limits, no S3, no S4, no clicks, no rubs, no murmurs ABD:  Flat, positive bowel sounds normal in frequency in pitch, no  bruits, no rebound, no guarding, no midline pulsatile mass, no hepatomegaly, no splenomegaly EXT:  2 plus pulses throughout, no edema, no cyanosis no clubbing SKIN:  No rashes no nodules NEURO:  Cranial nerves II through XII grossly intact, motor grossly intact throughout PSYCH:  Cognitively intact, oriented to person place and time   EKG:  EKG is not ordered today. The ekg ordered 03/18/17 demonstrates sinus rhythm.  Rate 118 bpm.    ETT 04/28/17:  There was no ST segment deviation noted during stress.   No ischemia on ECG. Normal BP response to exercise.  Normal exercise capacity.   Echo 04/21/17: Study Conclusions  -  Left ventricle: The cavity size was normal. Systolic function was   normal. The estimated ejection fraction was in the range of 55%   to 60%. Wall motion was normal; there were no regional wall   motion abnormalities. Left ventricular diastolic function   parameters were normal. - Mitral valve: There was mild regurgitation. - Atrial septum: No defect or patent foramen ovale was identified. - Pulmonary arteries: PA peak pressure: 31 mm Hg (S).    Recent Labs: 06/15/2017: BUN 9; Creatinine, Ser 0.89; Potassium 4.3; Sodium 140    Lipid Panel    Component Value Date/Time   CHOL 205 (A) 11/25/2015   TRIG 140 11/25/2015   HDL 48 11/25/2015   LDLCALC 129 11/25/2015      Wt Readings from Last 3 Encounters:  07/26/17 172 lb (78 kg)  07/26/17 173 lb 3 oz (78.6 kg)  06/07/17 173 lb 3.2 oz (78.6 kg)      ASSESSMENT AND PLAN:  # Shortness of breath:   # Atypical chest pain: Resolved.  ETT was negative and echo was unremarkable.  Continue with exercise regimen as able.  Suggested pool exercises given that she has sustained multiple toe fractures.  # Hypertension: BP remains elevated.  Continue losartan 50 mg daily.  We will add  propranolol 40 mg twice daily.  If her blood pressure remains elevated by this weekend she can increase this to 80 mg twice daily.   This should help her high blood pressure, anxiety, and tremor.    Current medicines are reviewed at length with the patient today.  The patient does not have concerns regarding medicines.  The following changes have been made:  no change  Labs/ tests ordered today include:   No orders of the defined types were placed in this encounter.    Disposition:   FU with Major Santerre C. Oval Linsey, MD, [facility] in 2 months.    This note was written with the assistance of speech recognition software.  Please excuse any transcriptional errors.  Signed, Luisdaniel Kenton C. Oval Linsey, MD, [facility]  07/26/2017 12:31 PM    [place]

## 2017-07-26 NOTE — Patient Instructions (Signed)
A few things to remember from today's visit:   Generalized osteoarthritis of multiple sites  Essential hypertension  Other specified anxiety disorders  Benign essential tremor  You will benefit from Propranolol for tremor and better blood pressure controlled.  Rest no changes.  Please be sure medication list is accurate. If a new problem present, please set up appointment sooner than planned today.

## 2017-07-27 ENCOUNTER — Telehealth: Payer: Self-pay | Admitting: *Deleted

## 2017-07-27 ENCOUNTER — Encounter: Payer: Self-pay | Admitting: Family Medicine

## 2017-07-27 NOTE — Telephone Encounter (Signed)
"  I was instructed to give you a call by Dr. Milinda Pointer.  I am going out for a while, so you can leave me a message.  I'll call you back when I get home or you can leave me a message about a good time to call you back."

## 2017-07-28 ENCOUNTER — Telehealth: Payer: Self-pay | Admitting: *Deleted

## 2017-07-28 NOTE — Telephone Encounter (Signed)
I'm returning your call.  How can I help you?  "I'd like to schedule my surgery for January 4."  Okay, I'll get it scheduled.  Someone from the surgical center will call you with the arrival time a day or two before surgery date.  "You will check my insurance?"  Yes, I will check your insurance.

## 2017-07-28 NOTE — Telephone Encounter (Signed)
"  You didn't give me a time for my surgery."  I informed you that the surgical center will call you with the arrival time a day or two prior to the surgery date.  "You can't tell me the time.  I want it in the early morning."  Well we are going to have to reschedule your surgery.  "I do not want to go to another date.  Why can't I tell you what time I want to do it?"  His schedule is already full for the morning.  Your surgery will be in the afternoon.  I cannot give you a time.  Someone from the surgical center will call you because they may have cancellations or have children or diabetics that need to have surgery first.  He can do it in the morning on January 11.  "Okay switch me to January 11 because I want it done in the morning."  I'll reschedule your surgery to January 11.

## 2017-07-29 ENCOUNTER — Telehealth: Payer: Self-pay | Admitting: Podiatry

## 2017-08-03 ENCOUNTER — Telehealth: Payer: Self-pay | Admitting: *Deleted

## 2017-08-03 NOTE — Telephone Encounter (Signed)
Pt states she would like to reschedule her surgery for 08/12/2017, from 08/19/2017.

## 2017-08-04 ENCOUNTER — Other Ambulatory Visit: Payer: Self-pay | Admitting: Family Medicine

## 2017-08-04 DIAGNOSIS — S60222A Contusion of left hand, initial encounter: Secondary | ICD-10-CM | POA: Diagnosis not present

## 2017-08-04 DIAGNOSIS — E049 Nontoxic goiter, unspecified: Secondary | ICD-10-CM | POA: Diagnosis not present

## 2017-08-04 DIAGNOSIS — E785 Hyperlipidemia, unspecified: Secondary | ICD-10-CM | POA: Diagnosis not present

## 2017-08-04 DIAGNOSIS — Z1159 Encounter for screening for other viral diseases: Secondary | ICD-10-CM | POA: Diagnosis not present

## 2017-08-04 DIAGNOSIS — S6992XA Unspecified injury of left wrist, hand and finger(s), initial encounter: Secondary | ICD-10-CM | POA: Diagnosis not present

## 2017-08-04 DIAGNOSIS — S63635A Sprain of interphalangeal joint of left ring finger, initial encounter: Secondary | ICD-10-CM | POA: Diagnosis not present

## 2017-08-04 NOTE — Telephone Encounter (Signed)
Left message informing pt Dr. Milinda Pointer had reviewed his schedule, he would like her surgery to remain on 08/19/2017.

## 2017-08-04 NOTE — Telephone Encounter (Signed)
This cannot be done with out a cancel from another patient.

## 2017-08-05 ENCOUNTER — Other Ambulatory Visit: Payer: Self-pay | Admitting: Family Medicine

## 2017-08-05 DIAGNOSIS — G4700 Insomnia, unspecified: Secondary | ICD-10-CM

## 2017-08-06 LAB — LIPID PANEL
CHOL/HDL RATIO: 4.2 ratio (ref 0.0–4.4)
Cholesterol, Total: 200 mg/dL — ABNORMAL HIGH (ref 100–199)
HDL: 48 mg/dL (ref >39)
LDL Calculated: 132 mg/dL — ABNORMAL HIGH (ref 0–99)
Triglycerides: 102 mg/dL (ref 0–149)
VLDL Cholesterol Cal: 20 mg/dL (ref 5–40)

## 2017-08-06 LAB — HEPATITIS C ANTIBODY: Hep C Virus Ab: <0.1 s/co ratio (ref 0.0–0.9)

## 2017-08-06 LAB — TSH: TSH: 5.04 u[IU]/mL — AB (ref 0.450–4.500)

## 2017-08-08 ENCOUNTER — Encounter: Payer: Self-pay | Admitting: Family Medicine

## 2017-08-08 NOTE — Telephone Encounter (Signed)
Last Rx for Alprazolam 0.25 mg filled on 06/01/17, she is not quite due for a new Rx.She usually receives 3 months supply (#90 tabs), so I believe she is due on 08/29/2017.  [facility]

## 2017-08-09 ENCOUNTER — Other Ambulatory Visit: Payer: Self-pay | Admitting: Family Medicine

## 2017-08-09 DIAGNOSIS — K219 Gastro-esophageal reflux disease without esophagitis: Secondary | ICD-10-CM

## 2017-08-14 ENCOUNTER — Other Ambulatory Visit: Payer: Self-pay | Admitting: Family Medicine

## 2017-08-14 DIAGNOSIS — G4700 Insomnia, unspecified: Secondary | ICD-10-CM

## 2017-08-14 MED ORDER — ALPRAZOLAM 0.25 MG PO TABS: 0.2500 mg | ORAL_TABLET | Freq: Every day | ORAL | 0 refills | Status: DC

## 2017-08-16 ENCOUNTER — Other Ambulatory Visit: Payer: Self-pay | Admitting: Podiatry

## 2017-08-16 MED ORDER — CEPHALEXIN 500 MG PO CAPS: 500.0000 mg | ORAL_CAPSULE | Freq: Three times a day (TID) | ORAL | 0 refills | Status: DC

## 2017-08-16 MED ORDER — PROMETHAZINE HCL 25 MG PO TABS: 25.0000 mg | ORAL_TABLET | Freq: Three times a day (TID) | ORAL | 0 refills | Status: DC | PRN

## 2017-08-16 MED ORDER — HYDROMORPHONE HCL 4 MG PO TABS: 4.0000 mg | ORAL_TABLET | ORAL | 0 refills | Status: DC | PRN

## 2017-08-19 ENCOUNTER — Encounter: Payer: Self-pay | Admitting: Podiatry

## 2017-08-19 ENCOUNTER — Telehealth: Payer: Self-pay | Admitting: Podiatry

## 2017-08-19 DIAGNOSIS — M25571 Pain in right ankle and joints of right foot: Secondary | ICD-10-CM | POA: Diagnosis not present

## 2017-08-19 DIAGNOSIS — M2011 Hallux valgus (acquired), right foot: Secondary | ICD-10-CM | POA: Diagnosis not present

## 2017-08-19 DIAGNOSIS — M205X1 Other deformities of toe(s) (acquired), right foot: Secondary | ICD-10-CM | POA: Diagnosis not present

## 2017-08-19 DIAGNOSIS — I10 Essential (primary) hypertension: Secondary | ICD-10-CM | POA: Diagnosis not present

## 2017-08-19 HISTORY — PX: JOINT REPLACEMENT: SHX530

## 2017-08-19 NOTE — Telephone Encounter (Signed)
I had surgery this morning by Dr. Milinda Pointer and the pharmacy will not fill my Dilaudid due to the amount and also something to do with my insurance. [name] stated they put a call in to your office. I told the pt that Colletta [place] with [facility] left a message and I sent it to [place], LPN in the [place] office as I'm in [place]. I gave [place] the main phone number of [phone] to the [facility] office for her to contact them as well.

## 2017-08-19 NOTE — Telephone Encounter (Signed)
This is Chemical engineer from [facility] on [address] in [place]. We received a prescription for Hydromorphone 4 mg tablets. This is an initial opoid prescription for her so the [facility] limit is 50 mg morphine equivalence per day which this prescription was written for 96 me's per day. I was calling to see if we could change it to the two mg tablets or adjust her directions to get that under the 50 me's. If someone would give [place] a call back with guidance at [phone].

## 2017-08-19 NOTE — Telephone Encounter (Signed)
Patient called and said that she had surgery this morning with Dr. Milinda Pointer and that she was given dilaudid 10 mlgs. The pharmacy ( [facility]) said that this mlg was in error that it was to high and they dont have it available. Patient stated the pharmacy had already called [facility] today and left a message to this effect. They did not contact the [place] office. Per Marcy Siren when I called her about this request.... Angie needs to handle this for the patient.  Please contact pt as soon as possible as she cannot go the weekend without her pain med's.

## 2017-08-19 NOTE — Telephone Encounter (Signed)
I spoke with Dr.Evans, he changed dose to 2mg  and directions 1 q6 hrs prn #28.  I returned call to [facility] pharmacist and notified her of the change in medication.  See previous telephone note.   Patient has been contacted regarding change in medication.

## 2017-08-22 ENCOUNTER — Telehealth: Payer: Self-pay | Admitting: *Deleted

## 2017-08-22 NOTE — Telephone Encounter (Signed)
POST OP CALL-  Called pt - states for the most part pain is manageable, has only taken 3 pain pills since surgery, rough night last night with pain, but overall seems good, no tightness in chest or calf, questions if okay to remove boot and sit with elevated while on the couch, advised okay, but reminded no walking without it. First POV confirmed.

## 2017-08-24 ENCOUNTER — Ambulatory Visit (INDEPENDENT_AMBULATORY_CARE_PROVIDER_SITE_OTHER): Payer: Medicare Other

## 2017-08-24 ENCOUNTER — Ambulatory Visit (INDEPENDENT_AMBULATORY_CARE_PROVIDER_SITE_OTHER): Payer: Medicare Other | Admitting: Podiatry

## 2017-08-24 ENCOUNTER — Encounter: Payer: Self-pay | Admitting: Podiatry

## 2017-08-24 DIAGNOSIS — M205X1 Other deformities of toe(s) (acquired), right foot: Secondary | ICD-10-CM

## 2017-08-24 NOTE — Progress Notes (Signed)
She presents today for her first postop visit status post Keller bunion implant right foot states that it feels great.  She is status post surgery August 19, 2017.  Objective: Vital signs are good dry sterile dressing intact was removed demonstrates rectus first metatarsophalangeal joint mild erythema considerable ecchymosis along the medial longitudinal arch but all in all no signs of infection looks very good and she has got good full free range of motion.  Radiographs confirm good position of the implant.  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  Plan: Well-healing first metatarsophalangeal joint right foot.  Encourage range of motion exercises redressed with a light dressing today follow-up with her in 1 week.

## 2017-08-31 ENCOUNTER — Ambulatory Visit (INDEPENDENT_AMBULATORY_CARE_PROVIDER_SITE_OTHER): Payer: Medicare Other | Admitting: Podiatry

## 2017-08-31 ENCOUNTER — Encounter: Payer: Self-pay | Admitting: Podiatry

## 2017-08-31 DIAGNOSIS — M205X1 Other deformities of toe(s) (acquired), right foot: Secondary | ICD-10-CM | POA: Diagnosis not present

## 2017-08-31 NOTE — Progress Notes (Signed)
She presents today 2 weeks status post Keller arthroplasty single silicone implant right.  States that it feels okay.  Objective: Vital signs are stable she is alert and oriented x3 there is no erythema mild edema no cellulitis drainage or odor incision site appears to be healing very nicely there is some scab around the incision site.  Is good range of motion of the toe without pain.  Assessment: Well-healing surgical toe hallux right.  Plan: I am allow her to get back into a Darco shoe with compression anklet.  Her to start washing this and applying lotion starting Friday.  I will follow-up with her in 2 weeks at which time we will do another set of x-rays and hopefully get back into a regular pair shoes.

## 2017-09-02 NOTE — Progress Notes (Signed)
Office Visit Note  Patient: Kristina [name]             Date of Birth: 12/21/1950           MRN: [id number]             PCP: Martinique, Betty G, MD Referring: Martinique, Betty G, MD Visit Date: 09/09/2017 Occupation: Retired Web designer    Subjective:  Pain in hands   History of Present Illness: Kristina [name] is a 67 y.o. female seen in consultation per request of her PCP.  According to patient her symptoms started in 2005 with lower back pain.  She was diagnosed with degenerative disc disease.  She states over time she has had injections in her back.  About 5 years ago she started having pain and discomfort in her right first MTP joint for which she has had cortisone injections off and on.  She states while she was living in [place] she developed increased discomfort in her hands about 3 years ago.  She was seen by a rheumatologist at [facility] there who did lab work and it came positive for CCP antibody.  She was offered Plaquenil but she declined that she was concerned about the ocular side effects.  She states she moved to [place] in May 2017.  She has had bilateral feet stress fractures since then.  She has been diagnosed with osteopenia.  As the right first MTP pain got worse and she was having cortisone injections she decided to undergo right first MTP replacement in January 2019 by Dr. Milinda Pointer.  She continues to have some discomfort in her trochanteric region.  She states she is also seen an orthopedic surgeon here locally who evaluated her back and gave her left trochanteric bursa injection.  She continues to have pain and swelling in her hands over the last few years.  She states she has difficulty wearing her rings.  She is also noticed some swelling in the right ankle and some discomfort in her right knee joint.  Activities of Daily Living:  Patient reports morning stiffness for 1 hour.   Patient Reports nocturnal pain.  Difficulty dressing/grooming:  Denies Difficulty climbing stairs: Denies Difficulty getting out of chair: Denies Difficulty using hands for taps, buttons, cutlery, and/or writing: Reports   Review of Systems  Constitutional: Positive for fatigue. Negative for weakness.  HENT: Positive for mouth dryness. Negative for mouth sores and nose dryness.   Eyes: Positive for dryness. Negative for pain, redness and visual disturbance.  Respiratory: Negative for cough, hemoptysis, shortness of breath and difficulty breathing.   Cardiovascular: Positive for hypertension. Negative for chest pain, palpitations, irregular heartbeat and swelling in legs/feet.  Gastrointestinal: Negative for blood in stool, constipation and diarrhea.  Endocrine: Negative for increased urination.  Genitourinary: Negative for painful urination.  Musculoskeletal: Positive for arthralgias, joint pain, joint swelling and morning stiffness. Negative for myalgias, muscle weakness, muscle tenderness and myalgias.  Skin: Positive for sensitivity to sunlight. Negative for color change, pallor, rash, hair loss, nodules/bumps, redness, skin tightness and ulcers.  Allergic/Immunologic: Negative for susceptible to infections.  Neurological: Negative for dizziness, numbness and headaches.  Hematological: Negative for swollen glands.  Psychiatric/Behavioral: Negative for depressed mood and sleep disturbance. The patient is nervous/anxious.     PMFS History:  Patient Active Problem List   Diagnosis Date Noted  . Benign essential tremor 07/26/2017  . Thyroid nodule 07/04/2017  . Essential hypertension 06/07/2017  . GERD (gastroesophageal reflux disease) 03/30/2016  . Vertigo,  benign positional 03/30/2016  . 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 Anxiety disorder 03/29/2016  . Insomnia 03/29/2016  . Osteopenia 03/29/2016  . Migraine headache without aura 03/29/2016  . Hyperlipidemia 03/29/2016  . History of basal cell carcinoma 03/29/2016  . Generalized osteoarthritis  of multiple sites 03/29/2016  . Back pain, chronic 03/29/2016    Past Medical History:  Diagnosis Date  . Anxiety   . Arthritis   . Cancer ([place])    basal and squamous cell skin  . Chicken pox   . COPD (chronic obstructive pulmonary disease) ([place])   . Depression   . GERD (gastroesophageal reflux disease)   . Migraines   . Thyroid disease     Family History  Problem Relation Age of Onset  . Arthritis Mother   . Diabetes Mother   . COPD Mother   . Heart failure Mother   . Stroke Father   . Hypertension Father   . Hyperlipidemia Father   . Diabetes Father   . CAD Father   . Breast cancer Maternal Aunt   . Cancer Sister        bladder cancer   . Throat cancer Brother   . Colon cancer Neg Hx   . Stomach cancer Neg Hx   . Esophageal cancer Neg Hx    Past Surgical History:  Procedure Laterality Date  . ABDOMINAL HYSTERECTOMY  1978  . CHOLECYSTECTOMY  2000  . COLONOSCOPY  2007  . JOINT REPLACEMENT Right 08/19/2017   great toe   . UPPER GASTROINTESTINAL ENDOSCOPY  2007,2014   Social History   Social History Narrative  . Not on file     Objective: Vital Signs: BP 127/82 (BP Location: Left Arm, Patient Position: Sitting, Cuff Size: Normal)   Pulse 92   Resp 16   Ht 5\' 2"  (1.575 m)   Wt 179 lb (81.2 kg)   LMP  (LMP Unknown)   BMI 32.74 kg/m    Physical Exam  Constitutional: She is oriented to person, place, and time. She appears well-developed and well-nourished.  HENT:  Head: Normocephalic and atraumatic.  Eyes: Conjunctivae and EOM are normal.  Neck: Normal range of motion.  Cardiovascular: Normal rate, regular rhythm, normal heart sounds and intact distal pulses.  Pulmonary/Chest: Effort normal and breath sounds normal.  Abdominal: Soft. Bowel sounds are normal.  Lymphadenopathy:    She has no cervical adenopathy.  Neurological: She is alert and oriented to person, place, and time.  Skin: Skin is warm and dry. Capillary refill takes less than 2 seconds.   Psychiatric: She has a normal mood and affect. Her behavior is normal.  Nursing note and vitals reviewed.    Musculoskeletal Exam: C-spine thoracic spine good range of motion.  She has discomfort with range of motion of her lumbar spine and limited range of motion.  Shoulder joints, elbow joints, wrist joints with good range of motion.  She has some tenderness over the right second MCP joint.  Some thickening of PIP/DIP joints were noted.  No obvious synovitis was noted.  She had discomfort with range of motion of her right hip joint.  She has discomfort range of motion of her right knee joint with some warmth on palpation.  She has tenderness across the MTPs and PIPs of her feet.  Postsurgical changes were noted in her right first MTP.  CDAI Exam: No CDAI exam completed.    Investigation: No additional findings. 09/23/15: CCP 35, RF <10, CRP 6.4, CBC WNL  Imaging: Dg  Foot Complete Right  Result Date: 08/24/2017 Please see detailed radiograph report in office note.  Xr Hip Unilat W Or W/o Pelvis 2-3 Views Right  Result Date: 09/09/2017 Mild superolateral narrowing and sclerosis was noted.  No chondrocalcinosis was noted.  Impression: These findings are consistent with mild osteoarthritis of the hip joint.  Xr Foot 2 Views Left  Result Date: 09/09/2017 First MTP, all PIP and DIP narrowing was noted.  Callus formation noted in second and third metatarsal shafts. Impression: These findings are consistent with osteoarthritis of the foot and prior right second and third metatarsal fracture.  Xr Foot 2 Views Right  Result Date: 09/09/2017 First MTP shows the metatarsal prosthesis in place.  None of the other MTP showed narrowing.  Minimal PIP DIP narrowing was noted.  Callus formation was noted in the second and third metatarsal shafts. Impression: These findings are consistent with osteoarthritis and old fracture in the metatarsal shafts  Xr Hand 2 View Left  Result Date: 09/09/2017 CMC  narrowing was noted.  No MCP changes were noted.  PIP and DIP narrowing was noted.  No intercarpal radiocarpal joint space narrowing was noted.  She has short fourth metacarpal, with possible fracture in the midshaft of the metacarpal. Impression: These findings are consistent with osteoarthritis of the hand.  Xr Hand 2 View Right  Result Date: 09/09/2017 No MCP joint narrowing was noted.  PIP and DIP joint space narrowing was noted.  No intercarpal or radiocarpal joint space narrowing was noted.  No erosive changes were noted. Impression: These findings are consistent with osteoarthritis of the hand.  Xr Knee 3 View Right  Result Date: 09/09/2017 Moderate medial compartment narrowing was noted.  No chondrocalcinosis was noted.  Severe patellofemoral narrowing was noted. 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   Speciality Comments: No specialty comments available.    Procedures:  No procedures performed Allergies: Meloxicam and Sulfa antibiotics   Assessment / Plan:     Visit Diagnoses: Pain in both hands - Plan: XR Hand 2 View Right, XR Hand 2 View Left, her x-rays reveal mild osteoarthritic changes.  There is possible fracture of the left metacarpal which is old.  No synovitis was noted on examination.  She continues to have ongoing pain and discomfort.  I will obtain following labs and a schedule ultrasound of her bilateral hands.  Sedimentation rate, Rheumatoid factor, Cyclic citrul peptide antibody, IgG, 14-3-3 eta Protein, ANA  Pain in right hip - Plan: XR HIP UNILAT W OR W/O PELVIS 2-3 VIEWS RIGHT.  She has mild osteoarthritic changes in her right hip joint.  Weight loss diet and exercise was discussed.  Chronic pain of right knee - Plan: XR KNEE 3 VIEW RIGHT.  She has moderate osteoarthritis of the knee joint.  She also has severe chondromalacia patella which could be contributing to her symptoms.  Weight loss will be helpful.  Knee  joint muscle strengthening exercises were discussed and handout was given.  Pain in both feet - Plan: XR Foot 2 Views Right, XR Foot 2 Views Left.  The x-rays are consistent with mild osteoarthritis of the feet.  History of replacement of right first MTP - Dr. Milinda Pointer 08/20/2017  DDD (degenerative disc disease), lumbar: Patient reports chronic back pain and had cortisone injections in the past.  Cyclic citrullinated peptide (CCP) antibody positive - CCP 35 (09/23/15), RF -this test were done in [place].  Osteopenia, unspecified location - Plan: VITAMIN D 25 Hydroxy (Vit-D  Deficiency, Fractures).  Use of calcium vitamin D and resistive exercises were discussed.  Patient had stress fractures in bilateral feet.  She has bone density scheduled.  Other fatigue - Plan: CBC with Differential/Platelet, COMPLETE METABOLIC PANEL WITH GFR, CK, Glucose 6 phosphate dehydrogenase, Serum protein electrophoresis with reflex   Other medical problems listed as follows:  Essential hypertension  Goiter, non-toxic  Benign essential tremor  History of basal cell carcinoma  Orders: Orders Placed This Encounter  Procedures  . XR Hand 2 View Right  . XR Hand 2 View Left  . XR HIP UNILAT W OR W/O PELVIS 2-3 VIEWS RIGHT  . XR KNEE 3 VIEW RIGHT  . XR Foot 2 Views Right  . XR Foot 2 Views Left  . CBC with Differential/Platelet  . COMPLETE METABOLIC PANEL WITH GFR  . CK  . Sedimentation rate  . Rheumatoid factor  . Cyclic citrul peptide antibody, IgG  . 14-3-3 eta Protein  . ANA  . Glucose 6 phosphate dehydrogenase  . Serum protein electrophoresis with reflex  . VITAMIN D 25 Hydroxy (Vit-D Deficiency, Fractures)   No orders of the defined types were placed in this encounter.   Face-to-face time spent with patient was 60 minutes.  Greater than 50% of time was spent in counseling and coordination of care.  Follow-Up Instructions: Return for Osteoarthritis.   Bo Merino, MD  Note - This  record has been created using Editor, commissioning.  Chart creation errors have been sought, but may not always  have been located. Such creation errors do not reflect on  the standard of medical care.

## 2017-09-09 ENCOUNTER — Ambulatory Visit (INDEPENDENT_AMBULATORY_CARE_PROVIDER_SITE_OTHER): Payer: Medicare Other

## 2017-09-09 ENCOUNTER — Ambulatory Visit (INDEPENDENT_AMBULATORY_CARE_PROVIDER_SITE_OTHER): Payer: PRIVATE HEALTH INSURANCE

## 2017-09-09 ENCOUNTER — Ambulatory Visit (INDEPENDENT_AMBULATORY_CARE_PROVIDER_SITE_OTHER): Payer: PRIVATE HEALTH INSURANCE | Admitting: Rheumatology

## 2017-09-09 ENCOUNTER — Ambulatory Visit (INDEPENDENT_AMBULATORY_CARE_PROVIDER_SITE_OTHER): Payer: Self-pay

## 2017-09-09 ENCOUNTER — Encounter: Payer: Self-pay | Admitting: Rheumatology

## 2017-09-09 VITALS — BP 127/82 | HR 92 | Resp 16 | Ht 62.0 in | Wt 179.0 lb

## 2017-09-09 DIAGNOSIS — Z96698 Presence of other orthopedic joint implants: Secondary | ICD-10-CM

## 2017-09-09 DIAGNOSIS — M79642 Pain in left hand: Secondary | ICD-10-CM

## 2017-09-09 DIAGNOSIS — M25551 Pain in right hip: Secondary | ICD-10-CM

## 2017-09-09 DIAGNOSIS — G8929 Other chronic pain: Secondary | ICD-10-CM | POA: Diagnosis not present

## 2017-09-09 DIAGNOSIS — M79671 Pain in right foot: Secondary | ICD-10-CM

## 2017-09-09 DIAGNOSIS — Z85828 Personal history of other malignant neoplasm of skin: Secondary | ICD-10-CM | POA: Diagnosis not present

## 2017-09-09 DIAGNOSIS — R5383 Other fatigue: Secondary | ICD-10-CM | POA: Diagnosis not present

## 2017-09-09 DIAGNOSIS — I10 Essential (primary) hypertension: Secondary | ICD-10-CM

## 2017-09-09 DIAGNOSIS — M5136 Other intervertebral disc degeneration, lumbar region: Secondary | ICD-10-CM

## 2017-09-09 DIAGNOSIS — M79641 Pain in right hand: Secondary | ICD-10-CM

## 2017-09-09 DIAGNOSIS — M25561 Pain in right knee: Secondary | ICD-10-CM

## 2017-09-09 DIAGNOSIS — M79672 Pain in left foot: Secondary | ICD-10-CM

## 2017-09-09 DIAGNOSIS — R7989 Other specified abnormal findings of blood chemistry: Secondary | ICD-10-CM

## 2017-09-09 DIAGNOSIS — G250 Essential tremor: Secondary | ICD-10-CM | POA: Diagnosis not present

## 2017-09-09 DIAGNOSIS — R768 Other specified abnormal immunological findings in serum: Secondary | ICD-10-CM

## 2017-09-09 DIAGNOSIS — M8580 Other specified disorders of bone density and structure, unspecified site: Secondary | ICD-10-CM | POA: Diagnosis not present

## 2017-09-09 DIAGNOSIS — E049 Nontoxic goiter, unspecified: Secondary | ICD-10-CM | POA: Diagnosis not present

## 2017-09-09 DIAGNOSIS — M859 Disorder of bone density and structure, unspecified: Secondary | ICD-10-CM | POA: Diagnosis not present

## 2017-09-09 NOTE — Patient Instructions (Signed)

## 2017-09-12 DIAGNOSIS — H524 Presbyopia: Secondary | ICD-10-CM | POA: Diagnosis not present

## 2017-09-12 DIAGNOSIS — H43811 Vitreous degeneration, right eye: Secondary | ICD-10-CM | POA: Diagnosis not present

## 2017-09-12 DIAGNOSIS — H25813 Combined forms of age-related cataract, bilateral: Secondary | ICD-10-CM | POA: Diagnosis not present

## 2017-09-13 ENCOUNTER — Other Ambulatory Visit: Payer: Self-pay

## 2017-09-13 LAB — CBC WITH DIFFERENTIAL/PLATELET
BASOS ABS: 60 {cells}/uL (ref 0–200)
BASOS PCT: 0.8 %
EOS ABS: 450 {cells}/uL (ref 15–500)
Eosinophils Relative: 6 %
HCT: 40.5 % (ref 35.0–45.0)
Hemoglobin: 13.5 g/dL (ref 11.7–15.5)
LYMPHS ABS: 1770 {cells}/uL (ref 850–3900)
MCH: 29.5 pg (ref 27.0–33.0)
MCHC: 33.3 g/dL (ref 32.0–36.0)
MCV: 88.6 fL (ref 80.0–100.0)
MONOS PCT: 6.5 %
MPV: 10.6 fL (ref 7.5–12.5)
NEUTROS ABS: 4733 {cells}/uL (ref 1500–7800)
NEUTROS PCT: 63.1 %
Platelets: 278 10*3/uL (ref 140–400)
RBC: 4.57 10*6/uL (ref 3.80–5.10)
RDW: 13.5 % (ref 11.0–15.0)
Total Lymphocyte: 23.6 %
WBC mixed population: 488 cells/uL (ref 200–950)
WBC: 7.5 10*3/uL (ref 3.8–10.8)

## 2017-09-13 LAB — COMPLETE METABOLIC PANEL WITH GFR
AG Ratio: 1.6 (calc) (ref 1.0–2.5)
ALT: 34 U/L — AB (ref 6–29)
AST: 24 U/L (ref 10–35)
Albumin: 3.8 g/dL (ref 3.6–5.1)
Alkaline phosphatase (APISO): 93 U/L (ref 33–130)
BILIRUBIN TOTAL: 0.4 mg/dL (ref 0.2–1.2)
BUN: 15 mg/dL (ref 7–25)
CALCIUM: 9 mg/dL (ref 8.6–10.4)
CHLORIDE: 107 mmol/L (ref 98–110)
CO2: 23 mmol/L (ref 20–32)
Creat: 0.91 mg/dL (ref 0.50–0.99)
GFR, EST AFRICAN AMERICAN: 76 mL/min/{1.73_m2} (ref >=60)
GFR, EST NON AFRICAN AMERICAN: 65 mL/min/{1.73_m2} (ref >=60)
GLUCOSE: 98 mg/dL (ref 65–99)
Globulin: 2.4 g/dL (calc) (ref 1.9–3.7)
POTASSIUM: 4.3 mmol/L (ref 3.5–5.3)
Sodium: 138 mmol/L (ref 135–146)
TOTAL PROTEIN: 6.2 g/dL (ref 6.1–8.1)

## 2017-09-13 LAB — ANA: ANA: NEGATIVE

## 2017-09-13 LAB — RHEUMATOID FACTOR: Rhuematoid fact SerPl-aCnc: <14 IU/mL (ref <14)

## 2017-09-13 LAB — CK: Total CK: 95 U/L (ref 29–143)

## 2017-09-13 LAB — VITAMIN D 25 HYDROXY (VIT D DEFICIENCY, FRACTURES): Vit D, 25-Hydroxy: 29 ng/mL — ABNORMAL LOW (ref 30–100)

## 2017-09-13 LAB — PROTEIN ELECTROPHORESIS, SERUM, WITH REFLEX
ALBUMIN ELP: 3.6 g/dL — AB (ref 3.8–4.8)
Alpha 1: 0.3 g/dL (ref 0.2–0.3)
Alpha 2: 0.7 g/dL (ref 0.5–0.9)
BETA 2: 0.4 g/dL (ref 0.2–0.5)
BETA GLOBULIN: 0.4 g/dL (ref 0.4–0.6)
GAMMA GLOBULIN: 0.9 g/dL (ref 0.8–1.7)
Total Protein: 6.4 g/dL (ref 6.1–8.1)

## 2017-09-13 LAB — CYCLIC CITRUL PEPTIDE ANTIBODY, IGG: Cyclic Citrullin Peptide Ab: <16 UNITS

## 2017-09-13 LAB — GLUCOSE 6 PHOSPHATE DEHYDROGENASE: G-6PDH: 17.3 U/g{Hb} (ref 7.0–20.5)

## 2017-09-13 LAB — SEDIMENTATION RATE: SED RATE: 28 mm/h (ref 0–30)

## 2017-09-13 LAB — 14-3-3 ETA PROTEIN: 14-3-3 eta Protein: <0.2 ng/mL (ref <0.2)

## 2017-09-13 MED ORDER — VITAMIN D (ERGOCALCIFEROL) 1.25 MG (50000 UNIT) PO CAPS: 50000.0000 [IU] | ORAL_CAPSULE | ORAL | 0 refills | Status: DC

## 2017-09-14 ENCOUNTER — Encounter: Payer: Self-pay | Admitting: Podiatry

## 2017-09-14 ENCOUNTER — Ambulatory Visit (INDEPENDENT_AMBULATORY_CARE_PROVIDER_SITE_OTHER): Payer: Medicare Other | Admitting: Podiatry

## 2017-09-14 ENCOUNTER — Ambulatory Visit (INDEPENDENT_AMBULATORY_CARE_PROVIDER_SITE_OTHER): Payer: Medicare Other

## 2017-09-14 DIAGNOSIS — M205X1 Other deformities of toe(s) (acquired), right foot: Secondary | ICD-10-CM

## 2017-09-14 NOTE — Progress Notes (Signed)
She presents today for follow-up of her Keller arthroplasty single silicone implant right foot.  She denies fever chills nausea vomiting states that he feels great.  Date of surgery August 19, 2017.  Objective: Vital signs are stable she is alert and oriented x3.  Pulses are palpable.  There is no erythema edema cellulitis drainage or odor.  She has great range of motion of the first metatarsophalangeal joint no open lesions or wounds.  Radiographs taken today demonstrate 3 views of the right foot no acute trauma.  She does have a first metatarsophalangeal joint replacement with grommets which appear to be in good position and intact.  Assessment: Well-healing surgical foot right date of surgery August 19, 2017.  Plan: Encouraged range of motion exercises get back into regular shoe gear follow-up with her in 1 month for another set of x-rays.

## 2017-09-20 DIAGNOSIS — N951 Menopausal and female climacteric states: Secondary | ICD-10-CM | POA: Diagnosis not present

## 2017-09-20 DIAGNOSIS — Z9189 Other specified personal risk factors, not elsewhere classified: Secondary | ICD-10-CM | POA: Diagnosis not present

## 2017-09-20 DIAGNOSIS — Z01411 Encounter for gynecological examination (general) (routine) with abnormal findings: Secondary | ICD-10-CM | POA: Diagnosis not present

## 2017-09-20 DIAGNOSIS — Z780 Asymptomatic menopausal state: Secondary | ICD-10-CM | POA: Diagnosis not present

## 2017-09-20 DIAGNOSIS — Z13820 Encounter for screening for osteoporosis: Secondary | ICD-10-CM | POA: Diagnosis not present

## 2017-09-20 DIAGNOSIS — M810 Age-related osteoporosis without current pathological fracture: Secondary | ICD-10-CM | POA: Diagnosis not present

## 2017-09-20 DIAGNOSIS — R87610 Atypical squamous cells of undetermined significance on cytologic smear of cervix (ASC-US): Secondary | ICD-10-CM | POA: Diagnosis not present

## 2017-09-20 DIAGNOSIS — R87810 Cervical high risk human papillomavirus (HPV) DNA test positive: Secondary | ICD-10-CM | POA: Diagnosis not present

## 2017-09-23 NOTE — Progress Notes (Signed)
Office Visit Note  Patient: Kristina [name]             Date of Birth: [birth year]           MRN: [id number]             PCP: Martinique, Betty G, MD Referring: Martinique, Betty G, MD Visit Date: 10/07/2017 Occupation: @GUAROCC @    Subjective:  Pain in hands.   History of Present Illness: Kristina [name] is a 67 y.o. female with history of osteoarthritis and disc disease.  She continues to have pain and discomfort in her bilateral hands.  She also has some discomfort in her right hip and bilateral knee joints.  She was a started on vitamin D for vitamin D deficiency which she has been taking currently.  She had left CMC injection 3 days ago which has helped.  Activities of Daily Living:  Patient reports morning stiffness for 1-2 hours.   Patient Denies nocturnal pain.  Difficulty dressing/grooming: Denies Difficulty climbing stairs: Denies Difficulty getting out of chair: Reports Difficulty using hands for taps, buttons, cutlery, and/or writing: Reports   Review of Systems  Constitutional: Positive for fatigue. Negative for night sweats and weakness.  HENT: Positive for mouth dryness.   Eyes: Positive for dryness.  Respiratory: Negative for shortness of breath.   Cardiovascular: Negative for swelling in legs/feet.  Gastrointestinal: Negative for abdominal pain.  Endocrine: Negative for heat intolerance.  Genitourinary: Negative for pelvic pain.  Musculoskeletal: Positive for arthralgias, joint pain and morning stiffness. Negative for joint swelling.  Skin: Negative for rash and hair loss.  Allergic/Immunologic: Negative for susceptible to infections.  Neurological: Negative for dizziness, memory loss and night sweats.  Hematological: Negative for bruising/bleeding tendency.  Psychiatric/Behavioral: The patient is nervous/anxious.     PMFS History:  Patient Active Problem List   Diagnosis Date Noted  . Benign essential tremor 07/26/2017  . Thyroid nodule 07/04/2017  . Essential  hypertension 06/07/2017  . GERD (gastroesophageal reflux disease) 03/30/2016  . 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 Anxiety disorder 03/29/2016  . Insomnia 03/29/2016  . Osteopenia 03/29/2016  . Migraine headache without aura 03/29/2016  . Hyperlipidemia 03/29/2016  . History of basal cell carcinoma 03/29/2016  . Generalized osteoarthritis of multiple sites 03/29/2016  . Back pain, chronic 03/29/2016    Past Medical History:  Diagnosis Date  . Anxiety   . Arthritis   . Cancer ([place])    basal and squamous cell skin  . Chicken pox   . COPD (chronic obstructive pulmonary disease) ([place])   . Depression   . GERD (gastroesophageal reflux disease)   . Migraines   . Thyroid disease     Family History  Problem Relation Age of Onset  . Arthritis Mother   . Diabetes Mother   . COPD Mother   . Heart failure Mother   . Stroke Father   . Hypertension Father   . Hyperlipidemia Father   . Diabetes Father   . CAD Father   . Breast cancer Maternal Aunt   . Cancer Sister        bladder cancer   . Throat cancer Brother   . Colon cancer Neg Hx   . Stomach cancer Neg Hx   . Esophageal cancer Neg Hx    Past Surgical History:  Procedure Laterality Date  . ABDOMINAL HYSTERECTOMY  1978  . CHOLECYSTECTOMY  2000  . COLONOSCOPY  2007  . JOINT REPLACEMENT Right 08/19/2017   great toe   . UPPER  GASTROINTESTINAL ENDOSCOPY  4196,2229   Social History   Social History Narrative  . Not on file     Objective: Vital Signs: BP 119/77 (BP Location: Left Arm, Patient Position: Sitting, Cuff Size: Normal)   Pulse 74   Resp 14   Ht 5\' 2"  (1.575 m)   Wt 178 lb (80.7 kg)   LMP  (LMP Unknown)   BMI 32.56 kg/m    Physical Exam  Constitutional: She is oriented to person, place, and time. She appears well-developed and well-nourished.  HENT:  Head: Normocephalic and atraumatic.  Eyes: Conjunctivae and EOM are normal.  Neck: Normal range of motion.  Cardiovascular: Normal rate, regular rhythm,  normal heart sounds and intact distal pulses.  Pulmonary/Chest: Effort normal and breath sounds normal.  Abdominal: Soft. Bowel sounds are normal.  Lymphadenopathy:    She has no cervical adenopathy.  Neurological: She is alert and oriented to person, place, and time.  Skin: Skin is warm and dry. Capillary refill takes less than 2 seconds.  Psychiatric: She has a normal mood and affect. Her behavior is normal.  Nursing note and vitals reviewed.    Musculoskeletal Exam: C-spine thoracic lumbar spine limited range of motion with discomfort.  Shoulder joints elbow joints wrist joints MCPs PIPs DIPs with good range of motion.  She has DIP PIP thickening in her hands and feet consistent with osteoarthritis.  She has some discomfort range of motion of her right hip joint.  She is crepitus in her bilateral knee joints without any warmth swelling or effusion.  CDAI Exam: No CDAI exam completed.    Investigation: No additional findings. CBC Latest Ref Rng & Units 09/09/2017 11/25/2015  WBC 3.8 - 10.8 Thousand/uL 7.5 7.6  Hemoglobin 11.7 - 15.5 g/dL 13.5 13.7  Hematocrit 35.0 - 45.0 % 40.5 41  Platelets 140 - 400 Thousand/uL 278 264   CMP Latest Ref Rng & Units 09/09/2017 09/09/2017 06/15/2017  Glucose 65 - 99 mg/dL - 98 120(H)  BUN 7 - 25 mg/dL - 15 9  Creatinine 0.50 - 0.99 mg/dL - 0.91 0.89  Sodium 135 - 146 mmol/L - 138 140  Potassium 3.5 - 5.3 mmol/L - 4.3 4.3  Chloride 98 - 110 mmol/L - 107 103  CO2 20 - 32 mmol/L - 23 25  Calcium 8.6 - 10.4 mg/dL - 9.0 9.1  Total Protein 6.1 - 8.1 g/dL 6.4 6.2 -  Total Bilirubin 0.2 - 1.2 mg/dL - 0.4 -  Alkaline Phos 25 - 125 U/L - - -  AST 10 - 35 U/L - 24 -  ALT 6 - 29 U/L - 34(H) -    Imaging: [place] Extrem Up Bilat Comp  Result Date: 10/05/2017 Ultrasound examination of bilateral hands was performed per EULAR recommendations. Using 12 MHz transducer, grayscale and power Doppler bilateral second, third, and fifth MCP joints and bilateral wrist joints  both dorsal and volar aspects were evaluated to look for synovitis or tenosynovitis. The findings were there was no synovitis or tenosynovitis on ultrasound examination. Right median nerve was 0.16 cm squares which was more than upper limits of normal and left median nerve was 0.19 cm squares which was more than upper limits of normal. Impression: Ultrasound examination did not show any synovitis or tenosynovitis.  Her bilateral median nerves were enlarged.  She is not having any symptoms of carpal tunnel syndrome.  Dg Foot Complete Right  Result Date: 09/14/2017 Please see detailed radiograph report in office note.  Mm Screening Breast Tomo  Bilateral  Result Date: 10/03/2017 CLINICAL DATA:  Screening. EXAM: DIGITAL SCREENING BILATERAL MAMMOGRAM WITH TOMO AND CAD COMPARISON:  Previous exam(s). ACR Breast Density Category c: The breast tissue is heterogeneously dense, which may obscure small masses. FINDINGS: There are no findings suspicious for malignancy. Images were processed with CAD. IMPRESSION: No mammographic evidence of malignancy. A result letter of this screening mammogram will be mailed directly to the patient. RECOMMENDATION: Screening mammogram in one year. (Code:SM-B-01Y) BI-RADS CATEGORY  1: Negative. Electronically Signed   By: Lillia Mountain M.D.   On: 10/03/2017 15:31   Xr Hip Unilat W Or W/o Pelvis 2-3 Views Right  Result Date: 09/09/2017 Mild superolateral narrowing and sclerosis was noted.  No chondrocalcinosis was noted.  Impression: These findings are consistent with mild osteoarthritis of the hip joint.  Xr Foot 2 Views Left  Result Date: 09/09/2017 First MTP, all PIP and DIP narrowing was noted.  Callus formation noted in second and third metatarsal shafts. Impression: These findings are consistent with osteoarthritis of the foot and prior right second and third metatarsal fracture.  Xr Foot 2 Views Right  Result Date: 09/09/2017 First MTP shows the metatarsal prosthesis in  place.  None of the other MTP showed narrowing.  Minimal PIP DIP narrowing was noted.  Callus formation was noted in the second and third metatarsal shafts. Impression: These findings are consistent with osteoarthritis and old fracture in the metatarsal shafts  Xr Hand 2 View Left  Result Date: 09/09/2017 CMC narrowing was noted.  No MCP changes were noted.  PIP and DIP narrowing was noted.  No intercarpal radiocarpal joint space narrowing was noted.  She has short fourth metacarpal, with possible fracture in the midshaft of the metacarpal. Impression: These findings are consistent with osteoarthritis of the hand.  Xr Hand 2 View Right  Result Date: 09/09/2017 No MCP joint narrowing was noted.  PIP and DIP joint space narrowing was noted.  No intercarpal or radiocarpal joint space narrowing was noted.  No erosive changes were noted. Impression: These findings are consistent with osteoarthritis of the hand.  Xr Knee 3 View Right  Result Date: 09/09/2017 Moderate medial compartment narrowing was noted.  No chondrocalcinosis was noted.  Severe patellofemoral narrowing was noted. 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   Speciality Comments: No specialty comments available.    Procedures:  No procedures performed Allergies: Meloxicam and Sulfa antibiotics   Assessment / Plan:     Visit Diagnoses: Primary osteoarthritis of both hands - No synovitis on exam.  Ultrasound was negative for synovitis.  She was having discomfort in her left CMC joint which was injected with cortisone and she had good response to it.  Joint protection muscle strengthening was discussed.  I have also given her prescription for topical diclofenac gel which will be helpful.  Her list of natural anti-inflammatories was given.  Unilateral primary osteoarthritis, right hip: Chronic pain weight loss diet and exercise was discussed.  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  She has moderate osteoarthritis and severe chondromalacia patella.  Exercises will be helpful.  Primary osteoarthritis of both feet: Proper fitting shoes were discussed.  DDD (degenerative disc disease), lumbar: She has chronic pain in her lower back.  Mild elevation of LFTs: Probably related to diclofenac use.  Have advised her to limit diclofenac use.  Vitamin D deficiency - Plan: VITAMIN D 25 Hydroxy (Vit-D Deficiency, Fractures).  She  is on vitamin D now.  We will check vitamin D level in 3 months.  Osteopenia of multiple sites: Use of calcium vitamin D and resistive exercises was discussed.  Other medical problems are listed as follows:  Other fatigue  Essential hypertension  Benign essential tremor  History of basal cell carcinoma  Other insomnia  Goiter, non-toxic    Orders: Orders Placed This Encounter  Procedures  . VITAMIN D 25 Hydroxy (Vit-D Deficiency, Fractures)   Meds ordered this encounter  Medications  . diclofenac sodium (VOLTAREN) 1 % GEL    Sig: Apply 3 gm to 3 large joints up to 3 times a day.Dispense 3 tubes with 3 refills.    Dispense:  3 Tube    Refill:  1    Face-to-face time spent with patient was 30 minutes.Greater than 50% of time was spent in counseling and coordination of care.  Follow-Up Instructions: Return if symptoms worsen or fail to improve, for Osteoarthritis.   Bo Merino, MD  Note - This record has been created using Editor, commissioning.  Chart creation errors have been sought, but may not always  have been located. Such creation errors do not reflect on  the standard of medical care.

## 2017-09-26 ENCOUNTER — Other Ambulatory Visit: Payer: Self-pay | Admitting: Obstetrics & Gynecology

## 2017-09-26 DIAGNOSIS — Z1231 Encounter for screening mammogram for malignant neoplasm of breast: Secondary | ICD-10-CM

## 2017-09-30 ENCOUNTER — Ambulatory Visit (INDEPENDENT_AMBULATORY_CARE_PROVIDER_SITE_OTHER): Payer: Medicare Other | Admitting: Cardiovascular Disease

## 2017-09-30 ENCOUNTER — Encounter: Payer: Self-pay | Admitting: Cardiovascular Disease

## 2017-09-30 VITALS — BP 110/60 | HR 75 | Ht 62.0 in | Wt 176.4 lb

## 2017-09-30 DIAGNOSIS — R0602 Shortness of breath: Secondary | ICD-10-CM | POA: Diagnosis not present

## 2017-09-30 DIAGNOSIS — E7800 Pure hypercholesterolemia, unspecified: Secondary | ICD-10-CM | POA: Diagnosis not present

## 2017-09-30 DIAGNOSIS — I10 Essential (primary) hypertension: Secondary | ICD-10-CM | POA: Diagnosis not present

## 2017-09-30 NOTE — Patient Instructions (Signed)
Medication Instructions:  Continue current medications  If you need a refill on your cardiac medications before your next appointment, please call your pharmacy.  Labwork: None Ordered  Testing/Procedures: None Ordered  Follow-Up: Your physician wants you to follow-up in: As Needed.      Thank you for choosing [facility]!!       

## 2017-09-30 NOTE — Progress Notes (Signed)
Cardiology Office Note   Date:  09/30/2017   ID:  Kristina [name], DOB Jun 24, 1951, MRN [id number]  PCP:  Martinique, Betty G, MD  Cardiologist:   Skeet Latch, MD   No chief complaint on file.   History of Present Illness: Kristina [name] is a 67 y.o. female with hypertension, hyperlipidemia, anxiety, hypothyroidism, and COPD who presents for follow up.  She was initially seen 04/2017 for an evaluation of dyspnea.  Kristina [name] saw Dr. Martinique on 03/18/17 and reported exertional dyspnea and chest tightness.  She was referred to cardiology for further evaluation. For Kristina last several years she has noted intermittent shortness of breath.  She feels like she can't get a deep breath.  It occurs both at rest and with exertion.  Dr. Martinique prescribed her an albuterol inhaler which has helped. She noted some occasional chest tightness when walking that is approximately 2 out of 10 in severity. She denies orthopnea or PND.  Kristina [name] smoked 1-2 ppd for 25 years but quit in 1995.  She reportedly had a heart catheterization over 10 years ago that was negative for obstructive coronary disease.  She was referred for an ETT 04/28/17 that was negative for ischemia.  She achieved 7.3 METs on Kristina Bruce protocol.  She also had an echo 04/21/17 that revealed  LVEF 55-60% with normal diastolic function and mild mitral regurgitation.  Kristina [name] blood pressure was elevated despite working on diet and exercise so she was started on losartan. She also suffers from anxiety and Dr. Martinique recommended that she add propranolol.  Since making that change her blood pressure has been much better-controlled.  For Kristina most part it is been less than 130/80.  It is occasionally  around 132.  Her exercise has been limited lately because she had surgery on her right great toe.  She has been finally cleared to start walking some.  She has no chest pain or shortness of breath with this activity.  She recently had a cold but Kristina symptoms have  improved.  She has no lower extremity edema, orthopnea, or PND.  She denies palpitations, lightheadedness, or dizziness.  She notes that her cholesterol was checked with her PCP in December and it was elevated.  However she wanted to work on diet and exercise prior to starting any medications.   Past Medical History:  Diagnosis Date  . Anxiety   . Arthritis   . Cancer ([place])    basal and squamous cell skin  . Chicken pox   . COPD (chronic obstructive pulmonary disease) ([place])   . Depression   . GERD (gastroesophageal reflux disease)   . Migraines   . Thyroid disease     Past Surgical History:  Procedure Laterality Date  . ABDOMINAL HYSTERECTOMY  1978  . CHOLECYSTECTOMY  2000  . COLONOSCOPY  2007  . JOINT REPLACEMENT Right 08/19/2017   great toe   . UPPER GASTROINTESTINAL ENDOSCOPY  8546,[id number]     Current Outpatient Medications  Medication Sig Dispense Refill  . albuterol (PROVENTIL HFA;VENTOLIN HFA) 108 (90 Base) MCG/ACT inhaler Inhale 2 puffs into Kristina lungs every 6 (six) hours as needed for wheezing or shortness of breath. 1 Inhaler 0  . ALPRAZolam (XANAX) 0.25 MG tablet Take 1 tablet (0.25 mg total) by mouth at bedtime. 90 tablet 0  . Azelaic Acid (FINACEA) 15 % cream Apply 1 application topically 2 (two) times daily. After skin is thoroughly washed and patted dry, gently but thoroughly massage a thin  film of azelaic acid cream into Kristina affected area twice daily, in Kristina morning and evening.    . cyclobenzaprine (FLEXERIL) 10 MG tablet TAKE 1 TABLET EVERY DAY 90 tablet 1  . diclofenac (VOLTAREN) 50 MG EC tablet Take 50 mg by mouth daily.    . DULoxetine (CYMBALTA) 60 MG capsule Take 1 capsule (60 mg total) by mouth daily. 90 capsule 2  . levothyroxine (SYNTHROID, LEVOTHROID) 25 MCG tablet Take 25 mcg by mouth daily before breakfast.    . Magnesium 250 MG TABS Take by mouth daily.    . meclizine (ANTIVERT) 12.5 MG tablet Take 1 tablet (12.5 mg total) by mouth 3 (three) times daily  as needed for dizziness. 60 tablet 1  . Multiple Vitamin (MULTIVITAMIN) tablet Take 1 tablet by mouth daily.    Kristina Kitchen omeprazole (PRILOSEC) 40 MG capsule TAKE 1 CAPSULE EVERY DAY 90 capsule 2  . propranolol (INDERAL) 40 MG tablet Take 1 tablet (40 mg total) by mouth 2 (two) times daily. 60 tablet 5  . vitamin B-12 (CYANOCOBALAMIN) 1000 MCG tablet Take 1,000 mcg by mouth once a week.    . Vitamin D, Ergocalciferol, (DRISDOL) 50000 units CAPS capsule Take 1 capsule (50,000 Units total) by mouth every 7 (seven) days. 12 capsule 0  . losartan (COZAAR) 50 MG tablet Take 1 tablet (50 mg total) by mouth daily. 90 tablet 1   Current Facility-Administered Medications  Medication Dose Route Frequency Provider Last Rate Last Dose  . 0.9 %  sodium chloride infusion  500 mL Intravenous Continuous Gatha Mayer, MD        Allergies:   Meloxicam and Sulfa antibiotics    Social History:  Kristina [name]  reports that she quit smoking about 26 years ago. she has never used smokeless tobacco. She reports that she drinks alcohol. She reports that she does not use drugs.   Family History:  Kristina [name]'s family history includes Arthritis in her mother; Breast cancer in her maternal aunt; CAD in her father; COPD in her mother; Cancer in her sister; Diabetes in her father and mother; Heart failure in her mother; Hyperlipidemia in her father; Hypertension in her father; Stroke in her father; Throat cancer in her brother.    ROS:  Please see Kristina history of present illness.   Otherwise, review of systems are positive for none.   All other systems are reviewed and negative.    PHYSICAL EXAM: VS:  BP 110/60   Pulse 75   Ht 5\' 2"  (1.575 m)   Wt 176 lb 6.4 oz (80 kg)   LMP  (LMP Unknown)   BMI 32.26 kg/m  , BMI Body mass index is 32.26 kg/m. GENERAL:  Well appearing HEENT: Pupils equal round and reactive, fundi not visualized, oral mucosa unremarkable NECK:  No jugular venous distention, waveform within normal  limits, carotid upstroke brisk and symmetric, no bruits LUNGS:  Clear to auscultation bilaterally HEART:  RRR.  PMI not displaced or sustained,S1 and S2 within normal limits, no S3, no S4, no clicks, no rubs, no murmurs ABD:  Flat, positive bowel sounds normal in frequency in pitch, no bruits, no rebound, no guarding, no midline pulsatile mass, no hepatomegaly, no splenomegaly EXT:  2 plus pulses throughout, no edema, no cyanosis no clubbing SKIN:  No rashes no nodules NEURO:  Cranial nerves II through XII grossly intact, motor grossly intact throughout PSYCH:  Cognitively intact, oriented to person place and time   EKG:  EKG is ordered today.  Kristina ekg ordered 03/18/17 demonstrates sinus rhythm.  Rate 118 bpm.   09/30/17: Sinus rhythm.  Rate 75 bpm.   ETT 04/28/17:  There was no ST segment deviation noted during stress.   No ischemia on ECG. Normal BP response to exercise.  Normal exercise capacity.   Echo 04/21/17: Study Conclusions  - Left ventricle: Kristina cavity size was normal. Systolic function was   normal. Kristina estimated ejection fraction was in Kristina range of 55%   to 60%. Wall motion was normal; there were no regional wall   motion abnormalities. Left ventricular diastolic function   parameters were normal. - Mitral valve: There was mild regurgitation. - Atrial septum: No defect or patent foramen ovale was identified. - Pulmonary arteries: PA peak pressure: 31 mm Hg (S).    Recent Labs: 08/04/2017: TSH 5.040 09/09/2017: ALT 34; BUN 15; Creat 0.91; Hemoglobin 13.5; Platelets 278; Potassium 4.3; Sodium 138    Lipid Panel    Component Value Date/Time   CHOL 200 (H) 08/04/2017 0830   TRIG 102 08/04/2017 0830   HDL 48 08/04/2017 0830   CHOLHDL 4.2 08/04/2017 0830   LDLCALC 132 (H) 08/04/2017 0830      Wt Readings from Last 3 Encounters:  09/30/17 176 lb 6.4 oz (80 kg)  09/09/17 179 lb (81.2 kg)  07/26/17 172 lb (78 kg)      ASSESSMENT AND PLAN:  # Shortness of  breath:   # Atypical chest pain: Resolved.  ETT was negative and echo was unremarkable.  She will start back exercising as her toe heals.  # Hypertension: BP much better controlled on losartan and propranolol.   # Hyperlipidemia: She wants to work on diet and exercise.  She has follow up with her PCP this summer.    Current medicines are reviewed at length with Kristina [name] today.  Kristina [name] does not have concerns regarding medicines.  Kristina following changes have been made:  no change  Labs/ tests ordered today include:   No orders of Kristina defined types were placed in this encounter.    Disposition:   FU with Kasiyah Platter C. Oval Linsey, MD, [facility] as needed.    This note was written with Kristina assistance of speech recognition software.  Please excuse any transcriptional errors.  Signed, Roch Quach C. Oval Linsey, MD, [facility]  09/30/2017 9:41 AM    [place]

## 2017-10-03 ENCOUNTER — Ambulatory Visit
Admission: RE | Admit: 2017-10-03 | Discharge: 2017-10-03 | Disposition: A | Discharge status: Home / Self Care | Payer: Medicare Other | Source: Ambulatory Visit | Attending: Obstetrics & Gynecology | Admitting: Obstetrics & Gynecology

## 2017-10-03 DIAGNOSIS — Z1231 Encounter for screening mammogram for malignant neoplasm of breast: Secondary | ICD-10-CM | POA: Diagnosis not present

## 2017-10-05 ENCOUNTER — Ambulatory Visit (INDEPENDENT_AMBULATORY_CARE_PROVIDER_SITE_OTHER): Payer: Self-pay

## 2017-10-05 ENCOUNTER — Ambulatory Visit (INDEPENDENT_AMBULATORY_CARE_PROVIDER_SITE_OTHER): Payer: Medicare Other | Admitting: Rheumatology

## 2017-10-05 DIAGNOSIS — M79641 Pain in right hand: Secondary | ICD-10-CM

## 2017-10-05 DIAGNOSIS — M79642 Pain in left hand: Secondary | ICD-10-CM | POA: Diagnosis not present

## 2017-10-05 DIAGNOSIS — M65312 Trigger thumb, left thumb: Secondary | ICD-10-CM | POA: Diagnosis not present

## 2017-10-05 MED ORDER — TRIAMCINOLONE ACETONIDE 40 MG/ML IJ SUSP
10.0000 mg | INTRAMUSCULAR | Status: AC | PRN
  Administered 2017-10-05: 10 mg

## 2017-10-05 MED ORDER — LIDOCAINE HCL 1 % IJ SOLN
0.5000 mL | INTRAMUSCULAR | Status: AC | PRN
  Administered 2017-10-05: .5 mL

## 2017-10-05 NOTE — Progress Notes (Signed)
   Procedure Note  Patient: Kristina [name]             Date of Birth: January 07, 1951           MRN: [id number]             Visit Date: 10/05/2017  Procedures: Visit Diagnoses: Pain in both hands - Plan: [place] Extrem Up Bilat Comp  Trigger finger of left thumb  Hand/UE Inj: R thumb A1 for trigger finger on 10/05/2017 1:31 PM Indications: pain, tendon swelling and therapeutic Details: 27 G needle, ultrasound-guided volar approach Medications: 0.5 mL lidocaine 1 %; 10 mg triamcinolone acetonide 40 MG/ML Aspirate: 0 mL Immediately prior to procedure a time out was called to verify the correct patient, procedure, equipment, support staff and site/side marked as required. Patient was prepped and draped in the usual sterile fashion.     Bo Merino, MD

## 2017-10-07 ENCOUNTER — Encounter: Payer: Self-pay | Admitting: Rheumatology

## 2017-10-07 ENCOUNTER — Ambulatory Visit (INDEPENDENT_AMBULATORY_CARE_PROVIDER_SITE_OTHER): Payer: Medicare Other | Admitting: Rheumatology

## 2017-10-07 VITALS — BP 119/77 | HR 74 | Resp 14 | Ht 62.0 in | Wt 178.0 lb

## 2017-10-07 DIAGNOSIS — M1711 Unilateral primary osteoarthritis, right knee: Secondary | ICD-10-CM

## 2017-10-07 DIAGNOSIS — Z85828 Personal history of other malignant neoplasm of skin: Secondary | ICD-10-CM

## 2017-10-07 DIAGNOSIS — I10 Essential (primary) hypertension: Secondary | ICD-10-CM

## 2017-10-07 DIAGNOSIS — G250 Essential tremor: Secondary | ICD-10-CM | POA: Diagnosis not present

## 2017-10-07 DIAGNOSIS — M1611 Unilateral primary osteoarthritis, right hip: Secondary | ICD-10-CM

## 2017-10-07 DIAGNOSIS — M8589 Other specified disorders of bone density and structure, multiple sites: Secondary | ICD-10-CM | POA: Diagnosis not present

## 2017-10-07 DIAGNOSIS — R5383 Other fatigue: Secondary | ICD-10-CM

## 2017-10-07 DIAGNOSIS — M19041 Primary osteoarthritis, right hand: Secondary | ICD-10-CM

## 2017-10-07 DIAGNOSIS — M19072 Primary osteoarthritis, left ankle and foot: Secondary | ICD-10-CM

## 2017-10-07 DIAGNOSIS — M5136 Other intervertebral disc degeneration, lumbar region: Secondary | ICD-10-CM

## 2017-10-07 DIAGNOSIS — E049 Nontoxic goiter, unspecified: Secondary | ICD-10-CM

## 2017-10-07 DIAGNOSIS — E559 Vitamin D deficiency, unspecified: Secondary | ICD-10-CM

## 2017-10-07 DIAGNOSIS — M19042 Primary osteoarthritis, left hand: Secondary | ICD-10-CM

## 2017-10-07 DIAGNOSIS — M19071 Primary osteoarthritis, right ankle and foot: Secondary | ICD-10-CM

## 2017-10-07 DIAGNOSIS — G4709 Other insomnia: Secondary | ICD-10-CM | POA: Diagnosis not present

## 2017-10-07 MED ORDER — DICLOFENAC SODIUM 1 % TD GEL: TRANSDERMAL | 1 refills | Status: DC

## 2017-10-07 NOTE — Patient Instructions (Signed)
Natural anti-inflammatories  You can purchase these at [employer], [employer] or online.  . Turmeric (capsules)  . Ginger (ginger root or capsules)  . Omega 3 (Fish, flax seeds, chia seeds, walnuts, almonds)  . Tart cherry (dried or extract)   Patient should be under the care of a physician while taking these supplements. This may not be reproduced without the permission of Dr. Margarine Grosshans.  

## 2017-10-18 ENCOUNTER — Ambulatory Visit: Payer: Medicare Other

## 2017-10-18 DIAGNOSIS — M205X1 Other deformities of toe(s) (acquired), right foot: Secondary | ICD-10-CM

## 2017-10-19 ENCOUNTER — Encounter: Payer: Self-pay | Admitting: Podiatry

## 2017-10-19 ENCOUNTER — Ambulatory Visit (INDEPENDENT_AMBULATORY_CARE_PROVIDER_SITE_OTHER): Payer: Medicare Other | Admitting: Podiatry

## 2017-10-19 ENCOUNTER — Ambulatory Visit (INDEPENDENT_AMBULATORY_CARE_PROVIDER_SITE_OTHER): Payer: Medicare Other

## 2017-10-19 DIAGNOSIS — M205X1 Other deformities of toe(s) (acquired), right foot: Secondary | ICD-10-CM

## 2017-10-19 NOTE — Progress Notes (Signed)
She presents today for follow-up of her Kristina [name] bunion implant date of surgery August 19, 2017.  States that I have some soreness but has been exercising more.  Objective: Vital signs are stable alert and oriented x3.  Pulses are palpable.  She has good range of motion of the first metatarsophalangeal joint of the right foot.  She has a very small area of dehiscence along the incision site.  At this point she is going to start taking care of this with a light dressing.  Assessment: Well-healing surgical foot right.  Plan: Encouraged range of motion exercises follow-up with me in a week.

## 2017-11-03 ENCOUNTER — Telehealth: Payer: Self-pay | Admitting: Rheumatology

## 2017-11-03 NOTE — Telephone Encounter (Signed)
Patient left a voicemail stating that she received a bill from [facility] stating that she owed money.  Patient states that Medicare denied payment "for some or all of the tests indicating they were not medically necessary based on the diagnosis."   Patient spoke with billing and they told her "our office submits all the bills for Dr. Estanislado Pandy and not their office."  Patient is requesting someone call her back to see if they can be resubmitted.

## 2017-11-03 NOTE — Telephone Encounter (Signed)
Patient returned your call stating the test that Medicare denied was: Vitamin D3 -  CPT code [phone] for $241.84

## 2017-11-03 NOTE — Telephone Encounter (Signed)
Patient called returning your call

## 2017-11-03 NOTE — Telephone Encounter (Signed)
Attempted to contact the patient and left message for patient to call the office.  

## 2017-11-03 NOTE — Telephone Encounter (Signed)
Patient states she was not sure which test have been denied by insurance but will call to find out and contact the office.

## 2017-11-06 ENCOUNTER — Other Ambulatory Visit: Payer: Self-pay | Admitting: Family Medicine

## 2017-11-06 DIAGNOSIS — G4700 Insomnia, unspecified: Secondary | ICD-10-CM

## 2017-11-07 NOTE — Telephone Encounter (Signed)
Patient advised there has been a dx added to her test. Patient advised that It may or may not be covered.

## 2017-11-08 MED ORDER — ALPRAZOLAM 0.25 MG PO TABS: 0.2500 mg | ORAL_TABLET | Freq: Every day | ORAL | 3 refills | Status: DC

## 2017-11-15 ENCOUNTER — Encounter: Payer: Self-pay | Admitting: Family Medicine

## 2017-11-15 ENCOUNTER — Other Ambulatory Visit: Payer: Self-pay | Admitting: Family Medicine

## 2017-11-15 MED ORDER — LEVOTHYROXINE SODIUM 25 MCG PO TABS: 25.0000 ug | ORAL_TABLET | Freq: Every day | ORAL | 1 refills | Status: DC

## 2017-11-15 MED ORDER — CYCLOBENZAPRINE HCL 10 MG PO TABS: 10.0000 mg | ORAL_TABLET | Freq: Every day | ORAL | 1 refills | Status: DC

## 2017-11-23 ENCOUNTER — Telehealth: Payer: Self-pay | Admitting: Rheumatology

## 2017-11-23 NOTE — Telephone Encounter (Signed)
Patient called stating that she spoke with someone from [facility] today and she was told they have no record of the code for Vitamin D3 being resubmitted.

## 2017-11-23 NOTE — Telephone Encounter (Signed)
Patient advised it has been taken care. Patient advised that there was a call made on 11/07/17 to add the dx code and a follow up call made today. Patient verbalized understanding .

## 2017-11-30 DIAGNOSIS — H43813 Vitreous degeneration, bilateral: Secondary | ICD-10-CM | POA: Diagnosis not present

## 2017-11-30 DIAGNOSIS — H25812 Combined forms of age-related cataract, left eye: Secondary | ICD-10-CM | POA: Diagnosis not present

## 2017-12-07 DIAGNOSIS — H2512 Age-related nuclear cataract, left eye: Secondary | ICD-10-CM | POA: Diagnosis not present

## 2017-12-07 DIAGNOSIS — H1852 Epithelial (juvenile) corneal dystrophy: Secondary | ICD-10-CM | POA: Diagnosis not present

## 2017-12-07 DIAGNOSIS — H2513 Age-related nuclear cataract, bilateral: Secondary | ICD-10-CM | POA: Diagnosis not present

## 2017-12-07 DIAGNOSIS — H25013 Cortical age-related cataract, bilateral: Secondary | ICD-10-CM | POA: Diagnosis not present

## 2017-12-07 DIAGNOSIS — H43811 Vitreous degeneration, right eye: Secondary | ICD-10-CM | POA: Diagnosis not present

## 2017-12-15 NOTE — Progress Notes (Signed)
Subjective:   Kristina [name] is a 67 y.o. female who presents for an Initial Medicare Annual Wellness Visit.  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   Educated regarding shingrix  Colonoscopy 03/2017 - repeat in 10 years Mammogram 10/03/2017 Dexa 09/10/2015 getting a bone density in June;  Dr. Janyth Pupa with [facility] States she does have some osteopenia   Also went to [facility] as she has OA   Cardiac Risk Factors include: advanced age (>27men, >1 women);dyslipidemia;family history of premature cardiovascular disease;hypertension;obesity (BMI >30kg/m2)     Objective:    Today's Vitals   12/16/17 0858  BP: 120/70  Pulse: 74  SpO2: 98%  Weight: 173 lb (78.5 kg)  Height: 5\' 2"  (1.575 m)   Body mass index is 31.64 kg/m.  Advanced Directives 12/16/2017 02/23/2017  Does Patient Have a Medical Advance Directive? No No   Standing order to review for AD Agreed to information on Advanced Directive Lives alone;  brother in [place] [facility] here   Focused face to face x  20 minutes discussing HCPOA and Living will and reviewed all the questions in the [place] forms. The patient voices understanding of HCPOA; LW reviewed and information provided on each question. Educated on how to revoke this HCPOA or LW at any time.   Also  discussed life prolonging measures (given a few examples) and where she could choose to initiate or not;  the ability to given the HCPOA power to change her living will or not if  she cannot speak for herself; as well as finalizing the will by 2 unrelated witnesses and notary.  Will call for questions and given information on [facility] pastoral department for further assistance.     Current Medications (verified) Outpatient Encounter Medications as of 12/16/2017  Medication Sig  . albuterol (PROVENTIL HFA;VENTOLIN HFA) 108 (90 Base) MCG/ACT inhaler Inhale 2 puffs into the lungs every 6 (six) hours as needed for wheezing or shortness of breath.  . ALPRAZolam (XANAX) 0.25 MG tablet Take 1 tablet (0.25 mg total) by mouth at bedtime.  . Azelaic Acid (FINACEA) 15 % cream Apply 1 application topically 2 (two) times daily. After skin is thoroughly washed and patted dry, gently but thoroughly massage a thin film of azelaic acid cream into the affected area twice daily, in the morning and evening.  . cyclobenzaprine (FLEXERIL) 10 MG tablet Take 1 tablet (10 mg total) by mouth daily.  . diclofenac (VOLTAREN) 50 MG EC tablet Take 50 mg by mouth daily.  . diclofenac sodium (VOLTAREN) 1 % GEL Apply 3 gm to 3 large joints up to 3 times a day.Dispense 3 tubes with 3 refills.  . DULoxetine (CYMBALTA) 60 MG capsule Take 1 capsule (60 mg total) by mouth daily.  Marland Kitchen levothyroxine (SYNTHROID, LEVOTHROID) 25 MCG tablet Take 1 tablet (25 mcg total) by mouth  daily before breakfast.  . Magnesium 250 MG TABS Take by mouth daily.  . meclizine (ANTIVERT) 12.5 MG tablet Take 1 tablet (12.5 mg total) by mouth 3 (three) times daily as needed for dizziness.  . Multiple Vitamin (MULTIVITAMIN) tablet Take 1 tablet by mouth daily.  Marland Kitchen omeprazole (PRILOSEC) 40 MG capsule TAKE 1 CAPSULE EVERY DAY  . propranolol (INDERAL) 40 MG tablet Take 1 tablet (40 mg total) by mouth 2 (two) times daily.  . vitamin B-12 (CYANOCOBALAMIN) 1000 MCG tablet Take 1,000 mcg by mouth once a week.  . losartan (COZAAR) 50 MG tablet Take 1 tablet (50 mg total) by mouth daily.  . [DISCONTINUED] Vitamin D, Ergocalciferol, (DRISDOL)  50000 units CAPS capsule Take 1 capsule (50,000 Units total) by mouth every 7 (seven) days. (Patient not taking: Reported on 12/16/2017)   Facility-Administered Encounter Medications as of 12/16/2017  Medication  . 0.9 %  sodium chloride infusion    Allergies (verified) Meloxicam and Sulfa antibiotics   History: Past Medical History:  Diagnosis Date  . Anxiety   . Arthritis   . Cancer ([place])    basal and squamous cell skin  . Chicken pox   . COPD (chronic obstructive pulmonary disease) ([place])   . Depression   . GERD (gastroesophageal reflux disease)   . Migraines   . Thyroid disease    Past Surgical History:  Procedure Laterality Date  . ABDOMINAL HYSTERECTOMY  1978  . CATARACT EXTRACTION Left 12/20/2017   will have the right one completed a month later   . CHOLECYSTECTOMY  2000  . COLONOSCOPY  2007  . JOINT REPLACEMENT Right 08/19/2017   great toe   . UPPER GASTROINTESTINAL ENDOSCOPY  3557,[id number]   Family History  Problem Relation Age of Onset  . Arthritis Mother   . Diabetes Mother   . COPD Mother   . Heart failure Mother   . Stroke Father   . Hypertension Father   . Hyperlipidemia Father   . Diabetes Father   . CAD Father   . Breast cancer Maternal Aunt   . Cancer Sister        bladder cancer   . Throat cancer Brother   . Colon cancer Neg Hx   . Stomach cancer Neg Hx   . Esophageal cancer Neg Hx    Social History   Socioeconomic History  . Marital status: Single    Spouse name: Not on file  . Number of children: Not on file  . Years of education: Not on file  . Highest education level: Not on file  Occupational History  . Occupation: retired    Comment: administration  Social Needs  . Financial resource strain: Not on file  . Food insecurity:    Worry: Not on file    Inability: Not on file  . Transportation needs:    Medical: Not on file    Non-medical: Not on file  Tobacco Use  . Smoking status: Former Smoker    Packs/day: 1.50    Years: 25.00      Pack years: 37.50    Last attempt to quit: 11/02/1990    Years since quitting: 27.1  . Smokeless tobacco: Never Used  Substance and Sexual Activity  . Alcohol use: Yes    Comment: glass of wine once a month  . Drug use: No  . Sexual activity: Never  Lifestyle  . Physical activity:    Days per week: Not on file    Minutes per session: Not  on file  . Stress: Not on file  Relationships  . Social connections:    Talks on phone: Not on file    Gets together: Not on file    Attends religious service: Not on file    Active member of club or organization: Not on file    Attends meetings of clubs or organizations: Not on file    Relationship status: Not on file  Other Topics Concern  . Not on file  Social History Narrative  . Not on file    Tobacco Counseling Counseling given: Yes   Clinical Intake:  Activities of Daily Living In your present state of health, do you have any difficulty performing the following activities: 12/16/2017  Hearing? N  Vision? N  Difficulty concentrating or making decisions? N  Walking or climbing stairs? N  Dressing or bathing? N  Doing errands, shopping? N  Preparing Food and eating ? N  Using the Toilet? N  In the past six months, have you accidently leaked urine? N  Do you have problems with loss of bowel control? N  Managing your Medications? N  Managing your Finances? N  Housekeeping or managing your Housekeeping? N  Some recent data might be hidden     Immunizations and Health Maintenance Immunization History  Administered Date(s) Administered  . Influenza, High Dose Seasonal PF 05/03/2017  . Influenza-Unspecified 05/03/2017  . Pneumococcal Polysaccharide-23 05/03/2017  . Tdap 08/10/2007   There are no preventive care reminders to display for this patient.  Patient Care Team: Martinique, Betty G, MD as PCP - General (Family Medicine)  Indicate any recent Medical Services you may have received from other than [facility] providers in  the past year (date may be approximate).     Assessment:   This is a routine wellness examination for Miyeko.  Hearing/Vision screen Hearing Screening Comments: Hearing issues - none Vision Screening Comments: Vision  Cataract surgery on Tuesday on the left Right will be following [facility] surgery center Dr. Herbert Deaner  Dietary issues and exercise activities discussed: Current Exercise Habits: Home exercise routine, Type of exercise: walking, Time (Minutes): 60, Frequency (Times/Week): 5, Weekly Exercise (Minutes/Week): 300, Intensity: Moderate  Goals    . Weight (lb) < 150 lb (68 kg)     Eats red, yellow and green Good Luck on this plan!       Depression Screen PHQ 2/9 Scores 12/16/2017  PHQ - 2 Score 0    Fall Risk Fall Risk  12/16/2017 11/01/2016  Falls in the past year? Yes No  Comment playing with a dog -  Number falls in past yr: 1 -  Injury with Fall? Yes -  Comment broke her knuckle at ring finger on left hand  -  Follow up Education provided -      Cognitive Function: Ad8 score reviewed for issues:  Issues making decisions:  Less interest in hobbies / activities:  Repeats questions, stories (family complaining):  Trouble using ordinary gadgets (microwave, computer, phone):  Forgets the month or year:   Mismanaging finances:   Remembering appts:  Daily problems with thinking and/or memory: Ad8 score is=0     Screening Tests Health Maintenance  Topic Date Due  . TETANUS/TDAP  12/17/2018 (Originally 08/09/2017)  . INFLUENZA VACCINE  03/09/2018  . PNA vac Low Risk Adult (2 of 2 - PCV13) 05/03/2018  . MAMMOGRAM  10/04/2019  . COLONOSCOPY  03/10/2027  . DEXA SCAN  Completed  . Hepatitis C Screening  Completed  Plan:      PCP Notes   Health Maintenance Dr. Janyth Pupa with [facility] States she does have some osteopenia and is following DEXA Mammogram annually  Colonoscopy 03/2017 - repeat in 10 years  Vision Cataract scheduled  5/14/ on the left Will complete the right on one month  Postpone tetanus for now  Pneumonia and shingrix taken at the pharmacy. Will send in her IMM record via my chart or at her next OV to update   Trying NOON online diet plan   Abnormal Screens  none  Referrals  none  Patient concerns; LDL cholesterol Completed education; fiber, exercise   Nurse Concerns; As noted   Next PCP apt She will schedule        I have personally reviewed and noted the following in the patient's chart:   . Medical and social history . Use of alcohol, tobacco or illicit drugs  . Current medications and supplements . Functional ability and status . Nutritional status . Physical activity . Advanced directives . List of other physicians . Hospitalizations, surgeries, and ER visits in previous 12 months . Vitals . Screenings to include cognitive, depression, and falls . Referrals and appointments  In addition, I have reviewed and discussed with patient certain preventive protocols, quality metrics, and best practice recommendations. A written personalized care plan for preventive services as well as general preventive health recommendations were provided to patient.     Wynetta Fines, RN   12/16/2017

## 2017-12-15 NOTE — Progress Notes (Signed)
HPI:   [name] is a 67 y.o. female, who is here today for 6 months follow up.   She was last seen on 07/26/17  Since her last OV she has followed with rheumatologist for OA and with cardiologist,Kristina [name].  She is on Cymbalta 60 mg and Alprazolam 0.25 mg daily as needed for anxiety. No suicidal thoughts. Problem stable.  HTN on Cozaar 50 mg daily. Inderal 40 mg bid was added since her last visit, to help with HTN and tremor. Tolerating medication well,no side effects.  Home BP's 120's/70's.   Lab Results  Component Value Date   CREATININE 0.91 09/09/2017   BUN 15 09/09/2017   NA 138 09/09/2017   K 4.3 09/09/2017   CL 107 09/09/2017   CO2 23 09/09/2017     Vit D deficiency: Completed 12 weeks treatment with Ergocalciferol 50,000 U. She is now on OTC Vit D 1000 U daily.  She just started exercise program, she has a [employer] manager, receives dietary and exercise recommendations. Recovering from toe fracture,started walking a few days ago.  Review of Systems  Constitutional: Positive for fatigue (No more than usual). Negative for activity change, appetite change and fever.  HENT: Negative for mouth sores, nosebleeds and trouble swallowing.   Eyes: Negative for redness and visual disturbance.  Respiratory: Negative for cough, shortness of breath and wheezing.   Cardiovascular: Negative for chest pain, palpitations and leg swelling.  Gastrointestinal: Negative for abdominal pain, nausea and vomiting.       Negative for changes in bowel habits.  Endocrine: Negative for cold intolerance and heat intolerance.  Genitourinary: Negative for decreased urine volume, difficulty urinating, dysuria and hematuria.  Musculoskeletal: Positive for arthralgias. Negative for gait problem.  Skin: Negative for rash.  Neurological: Negative for syncope, weakness and headaches.  Psychiatric/Behavioral: Positive for sleep disturbance. Negative for confusion. The patient is  nervous/anxious.       Current Outpatient Medications on File Prior to Visit  Medication Sig Dispense Refill  . albuterol (PROVENTIL HFA;VENTOLIN HFA) 108 (90 Base) MCG/ACT inhaler Inhale 2 puffs into the lungs every 6 (six) hours as needed for wheezing or shortness of breath. 1 Inhaler 0  . ALPRAZolam (XANAX) 0.25 MG tablet Take 1 tablet (0.25 mg total) by mouth at bedtime. 30 tablet 3  . Azelaic Acid (FINACEA) 15 % cream Apply 1 application topically 2 (two) times daily. After skin is thoroughly washed and patted dry, gently but thoroughly massage a thin film of azelaic acid cream into the affected area twice daily, in the morning and evening.    . brimonidine (ALPHAGAN) 0.2 % ophthalmic solution   1  . cyclobenzaprine (FLEXERIL) 10 MG tablet Take 1 tablet (10 mg total) by mouth daily. 90 tablet 1  . diclofenac (VOLTAREN) 50 MG EC tablet Take 50 mg by mouth daily.    . diclofenac sodium (VOLTAREN) 1 % GEL Apply 3 gm to 3 large joints up to 3 times a day.Dispense 3 tubes with 3 refills. 3 Tube 1  . DULoxetine (CYMBALTA) 60 MG capsule Take 1 capsule (60 mg total) by mouth daily. 90 capsule 2  . ketorolac (ACULAR) 0.5 % ophthalmic solution   1  . levothyroxine (SYNTHROID, LEVOTHROID) 25 MCG tablet Take 1 tablet (25 mcg total) by mouth daily before breakfast. 90 tablet 1  . Magnesium 250 MG TABS Take by mouth daily.    . meclizine (ANTIVERT) 12.5 MG tablet Take 1 tablet (12.5 mg total) by mouth 3 (  three) times daily as needed for dizziness. 60 tablet 1  . Multiple Vitamin (MULTIVITAMIN) tablet Take 1 tablet by mouth daily.    Marland Kitchen ofloxacin (OCUFLOX) 0.3 % ophthalmic solution   1  . omeprazole (PRILOSEC) 40 MG capsule TAKE 1 CAPSULE EVERY DAY 90 capsule 2  . prednisoLONE acetate (PRED FORTE) 1 % ophthalmic suspension   1  . propranolol (INDERAL) 40 MG tablet Take 1 tablet (40 mg total) by mouth 2 (two) times daily. 60 tablet 5  . vitamin B-12 (CYANOCOBALAMIN) 1000 MCG tablet Take 1,000 mcg by  mouth once a week.    . losartan (COZAAR) 50 MG tablet Take 1 tablet (50 mg total) by mouth daily. 90 tablet 1   Current Facility-Administered Medications on File Prior to Visit  Medication Dose Route Frequency Provider Last Rate Last Dose  . 0.9 %  sodium chloride infusion  500 mL Intravenous Continuous Gatha Mayer, MD         Past Medical History:  Diagnosis Date  . Anxiety   . Arthritis   . Cancer ([place])    basal and squamous cell skin  . Chicken pox   . COPD (chronic obstructive pulmonary disease) ([place])   . Depression   . GERD (gastroesophageal reflux disease)   . Migraines   . Thyroid disease    Allergies  Allergen Reactions  . Meloxicam Other (See Comments)    "skin infection on leg"  . Sulfa Antibiotics Rash    Rash all over body/fever    Social History   Socioeconomic History  . Marital status: Single    Spouse name: Not on file  . Number of children: Not on file  . Years of education: Not on file  . Highest education level: Not on file  Occupational History  . Occupation: retired    Comment: administration  Social Needs  . Financial resource strain: Not on file  . Food insecurity:    Worry: Not on file    Inability: Not on file  . Transportation needs:    Medical: Not on file    Non-medical: Not on file  Tobacco Use  . Smoking status: Former Smoker    Packs/day: 1.50    Years: 25.00    Pack years: 37.50    Last attempt to quit: 11/02/1990    Years since quitting: 27.1  . Smokeless tobacco: Never Used  Substance and Sexual Activity  . Alcohol use: Yes    Comment: glass of wine once a month  . Drug use: No  . Sexual activity: Never  Lifestyle  . Physical activity:    Days per week: Not on file    Minutes per session: Not on file  . Stress: Not on file  Relationships  . Social connections:    Talks on phone: Not on file    Gets together: Not on file    Attends religious service: Not on file    Active member of club or organization: Not  on file    Attends meetings of clubs or organizations: Not on file    Relationship status: Not on file  Other Topics Concern  . Not on file  Social History Narrative  . Not on file    Vitals:   12/16/17 1032  BP: 120/70  Pulse: 74  Resp: 12  Temp: 98.3 F (36.8 C)  SpO2: 98%   Body mass index is 31.64 kg/m.    Physical Exam  Nursing note and vitals reviewed. Constitutional: She  is oriented to person, place, and time. She appears well-developed. No distress.  HENT:  Head: Normocephalic and atraumatic.  Mouth/Throat: Oropharynx is clear and moist and mucous membranes are normal.  Eyes: Pupils are equal, round, and reactive to light. Conjunctivae are normal.  Cardiovascular: Normal rate and regular rhythm.  No murmur heard. Pulses:      Dorsalis pedis pulses are 2+ on the right side, and 2+ on the left side.  Respiratory: Effort normal and breath sounds normal. No respiratory distress.  GI: Soft. She exhibits no mass. There is no hepatomegaly. There is no tenderness.  Musculoskeletal: She exhibits no edema.  Lymphadenopathy:    She has no cervical adenopathy.  Neurological: She is alert and oriented to person, place, and time. She has normal strength. Gait normal.  Skin: Skin is warm. No rash noted. No erythema.  Psychiatric: Her mood appears anxious.  Well groomed, good eye contact.       ASSESSMENT AND PLAN:   Ms. Kristina [name] was seen today for 6 months follow-up.  Orders Placed This Encounter  Procedures  . VITAMIN D 25 Hydroxy (Vit-D Deficiency, Fractures)    Anxiety disorder Stable. Tolerating medications well. No changes in current management:Cymbalta 60 mg and Alprazolam 0.25 mg daily prn. F/U in 5-6 months,before if needed.    Essential hypertension Adequately controlled. No changes in current management. DASH-low salt diet to continue. Eye exam recommended annually. F/U in 5 months, before if needed.   Vitamin D deficiency,  unspecified Continue Vit D 1000 U daily. Further recommendations will be given according to lab results.  Class 1 obesity with body mass index (BMI) of 31.0 to 31.9 in adult Encouraged to continue exercise and nutritional program. Daily walking as tolerated. Consistency is important in order to see results.             -Ms. Kristina [name] was advised to return sooner than planned today if new concerns arise.       Kristina [name] G. [place], MD  [facility]. [place] office.

## 2017-12-16 ENCOUNTER — Ambulatory Visit (INDEPENDENT_AMBULATORY_CARE_PROVIDER_SITE_OTHER): Payer: Medicare Other | Admitting: Family Medicine

## 2017-12-16 ENCOUNTER — Encounter: Payer: Self-pay | Admitting: Family Medicine

## 2017-12-16 ENCOUNTER — Ambulatory Visit (INDEPENDENT_AMBULATORY_CARE_PROVIDER_SITE_OTHER): Payer: Medicare Other

## 2017-12-16 VITALS — BP 120/70 | HR 74 | Ht 62.0 in | Wt 173.0 lb

## 2017-12-16 VITALS — BP 120/70 | HR 74 | Temp 98.3°F | Resp 12 | Ht 62.0 in | Wt 173.0 lb

## 2017-12-16 DIAGNOSIS — Z6831 Body mass index (BMI) 31.0-31.9, adult: Secondary | ICD-10-CM | POA: Diagnosis not present

## 2017-12-16 DIAGNOSIS — F418 Other specified anxiety disorders: Secondary | ICD-10-CM

## 2017-12-16 DIAGNOSIS — Z0000 Encounter for general adult medical examination without abnormal findings: Secondary | ICD-10-CM | POA: Diagnosis not present

## 2017-12-16 DIAGNOSIS — E559 Vitamin D deficiency, unspecified: Secondary | ICD-10-CM

## 2017-12-16 DIAGNOSIS — I10 Essential (primary) hypertension: Secondary | ICD-10-CM

## 2017-12-16 DIAGNOSIS — E6609 Other obesity due to excess calories: Secondary | ICD-10-CM | POA: Diagnosis not present

## 2017-12-16 DIAGNOSIS — E669 Obesity, unspecified: Secondary | ICD-10-CM | POA: Insufficient documentation

## 2017-12-16 DIAGNOSIS — Z6830 Body mass index (BMI) 30.0-30.9, adult: Secondary | ICD-10-CM | POA: Insufficient documentation

## 2017-12-16 LAB — VITAMIN D 25 HYDROXY (VIT D DEFICIENCY, FRACTURES): VITD: 44.91 ng/mL (ref 30.00–100.00)

## 2017-12-16 NOTE — Patient Instructions (Addendum)
Kristina [name] , Thank you for taking time to come for your Medicare Wellness Visit. I appreciate your ongoing commitment to your health goals. Please review the following plan we discussed and let me know if I can assist you in the future.   Will check with your [facility] for a copy of your immunizations and bring these to the office  You have had the Tdap in 2009 You are due a regular tetanus A Tetanus is recommended every 10 years. Medicare covers a tetanus if you have a cut or wound; otherwise, there may be a charge.  Shingrix is a vaccine for the prevention of Shingles in Adults 50 and older.  If you are on Medicare, the shingrix is covered under your Part D plan, so you will take both of the vaccines in the series at your pharmacy. Please check with your benefits regarding applicable copays or out of pocket expenses.  The Shingrix is given in 2 vaccines approx 8 weeks apart. You must receive the 2nd dose prior to 6 months from receipt of the first. Please have the pharmacist print out you Immunization  dates for our office records     Let me know how you like the [place] plan!   These are the goals we discussed: Goals    . Weight (lb) < 150 lb (68 kg)     Eats red, yellow and green Good Luck on this plan!        This is a list of the screening recommended for you and due dates:  Health Maintenance  Topic Date Due  . Tetanus Vaccine  08/09/2017  . Flu Shot  03/09/2018  . Pneumonia vaccines (2 of 2 - PCV13) 05/03/2018  . Mammogram  10/04/2019  . Colon Cancer Screening  03/10/2027  . DEXA scan (bone density measurement)  Completed  .  Hepatitis C: One time screening is recommended by Center for Disease Control  (CDC) for  adults born from [birth year] through 1966.   Completed     Health Maintenance, Female Adopting a healthy lifestyle and getting preventive care can go a long way to promote health and wellness. Talk with your health care provider about what schedule of  regular examinations is right for you. This is a good chance for you to check in with your provider about disease prevention and staying healthy. In between checkups, there are plenty of things you can do on your own. Experts have done a lot of research about which lifestyle changes and preventive measures are most likely to keep you healthy. Ask your health care provider for more information. Weight and diet Eat a healthy diet  Be sure to include plenty of vegetables, fruits, low-fat dairy products, and lean protein.  Do not eat a lot of foods high in solid fats, added sugars, or salt.  Get regular exercise. This is one of the most important things you can do for your health. ? Most adults should exercise for at least 150 minutes each week. The exercise should increase your heart rate and make you sweat (moderate-intensity exercise). ? Most adults should also do strengthening exercises at least twice a week. This is in addition to the moderate-intensity exercise.  Maintain a healthy weight  Body mass index (BMI) is a measurement that can be used to identify possible weight problems. It estimates body fat based on height and weight. Your health care provider can help determine your BMI and help you achieve or maintain a healthy weight.  For females 71 years of age and older: ? A BMI below 18.5 is considered underweight. ? A BMI of 18.5 to 24.9 is normal. ? A BMI of 25 to 29.9 is considered overweight. ? A BMI of 30 and above is considered obese.  Watch levels of cholesterol and blood lipids  You should start having your blood tested for lipids and cholesterol at 68 years of age, then have this test every 5 years.  You may need to have your cholesterol levels checked more often if: ? Your lipid or cholesterol levels are high. ? You are older than 68 years of age. ? You are at high risk for heart disease.  Cancer screening Lung Cancer  Lung cancer screening is recommended for adults  71-49 years old who are at high risk for lung cancer because of a history of smoking.  A yearly low-dose CT scan of the lungs is recommended for people who: ? Currently smoke. ? Have quit within the past 15 years. ? Have at least a 30-pack-year history of smoking. A pack year is smoking an average of one pack of cigarettes a day for 1 year.  Yearly screening should continue until it has been 15 years since you quit.  Yearly screening should stop if you develop a health problem that would prevent you from having lung cancer treatment.  Breast Cancer  Practice breast self-awareness. This means understanding how your breasts normally appear and feel.  It also means doing regular breast self-exams. Let your health care provider know about any changes, no matter how small.  If you are in your 20s or 30s, you should have a clinical breast exam (CBE) by a health care provider every 1-3 years as part of a regular health exam.  If you are 93 or older, have a CBE every year. Also consider having a breast X-ray (mammogram) every year.  If you have a family history of breast cancer, talk to your health care provider about genetic screening.  If you are at high risk for breast cancer, talk to your health care provider about having an MRI and a mammogram every year.  Breast cancer gene (BRCA) assessment is recommended for women who have family members with BRCA-related cancers. BRCA-related cancers include: ? Breast. ? Ovarian. ? Tubal. ? Peritoneal cancers.  Results of the assessment will determine the need for genetic counseling and BRCA1 and BRCA2 testing.  Cervical Cancer Your health care provider may recommend that you be screened regularly for cancer of the pelvic organs (ovaries, uterus, and vagina). This screening involves a pelvic examination, including checking for microscopic changes to the surface of your cervix (Pap test). You may be encouraged to have this screening done every 3  years, beginning at age 39.  For women ages 42-65, health care providers may recommend pelvic exams and Pap testing every 3 years, or they may recommend the Pap and pelvic exam, combined with testing for human papilloma virus (HPV), every 5 years. Some types of HPV increase your risk of cervical cancer. Testing for HPV may also be done on women of any age with unclear Pap test results.  Other health care providers may not recommend any screening for nonpregnant women who are considered low risk for pelvic cancer and who do not have symptoms. Ask your health care provider if a screening pelvic exam is right for you.  If you have had past treatment for cervical cancer or a condition that could lead to cancer, you need Pap  tests and screening for cancer for at least 20 years after your treatment. If Pap tests have been discontinued, your risk factors (such as having a new sexual partner) need to be reassessed to determine if screening should resume. Some women have medical problems that increase the chance of getting cervical cancer. In these cases, your health care provider may recommend more frequent screening and Pap tests.  Colorectal Cancer  This type of cancer can be detected and often prevented.  Routine colorectal cancer screening usually begins at 68 years of age and continues through 68 years of age.  Your health care provider may recommend screening at an earlier age if you have risk factors for colon cancer.  Your health care provider may also recommend using home test kits to check for hidden blood in the stool.  A small camera at the end of a tube can be used to examine your colon directly (sigmoidoscopy or colonoscopy). This is done to check for the earliest forms of colorectal cancer.  Routine screening usually begins at age 61.  Direct examination of the colon should be repeated every 5-10 years through 68 years of age. However, you may need to be screened more often if early  forms of precancerous polyps or small growths are found.  Skin Cancer  Check your skin from head to toe regularly.  Tell your health care provider about any new moles or changes in moles, especially if there is a change in a mole's shape or color.  Also tell your health care provider if you have a mole that is larger than the size of a pencil eraser.  Always use sunscreen. Apply sunscreen liberally and repeatedly throughout the day.  Protect yourself by wearing long sleeves, pants, a wide-brimmed hat, and sunglasses whenever you are outside.  Heart disease, diabetes, and high blood pressure  High blood pressure causes heart disease and increases the risk of stroke. High blood pressure is more likely to develop in: ? People who have blood pressure in the high end of the normal range (130-139/85-89 mm Hg). ? People who are overweight or obese. ? People who are African American.  If you are 94-10 years of age, have your blood pressure checked every 3-5 years. If you are 1 years of age or older, have your blood pressure checked every year. You should have your blood pressure measured twice-once when you are at a hospital or clinic, and once when you are not at a hospital or clinic. Record the average of the two measurements. To check your blood pressure when you are not at a hospital or clinic, you can use: ? An automated blood pressure machine at a pharmacy. ? A home blood pressure monitor.  If you are between 63 years and 64 years old, ask your health care provider if you should take aspirin to prevent strokes.  Have regular diabetes screenings. This involves taking a blood sample to check your fasting blood sugar level. ? If you are at a normal weight and have a low risk for diabetes, have this test once every three years after 68 years of age. ? If you are overweight and have a high risk for diabetes, consider being tested at a younger age or more often. Preventing infection Hepatitis  B  If you have a higher risk for hepatitis B, you should be screened for this virus. You are considered at high risk for hepatitis B if: ? You were born in a country where hepatitis B is  common. Ask your health care provider which countries are considered high risk. ? Your parents were born in a high-risk country, and you have not been immunized against hepatitis B (hepatitis B vaccine). ? You have HIV or AIDS. ? You use needles to inject street drugs. ? You live with someone who has hepatitis B. ? You have had sex with someone who has hepatitis B. ? You get hemodialysis treatment. ? You take certain medicines for conditions, including cancer, organ transplantation, and autoimmune conditions.  Hepatitis C  Blood testing is recommended for: ? Everyone born from [birth year] through 1966. ? Anyone with known risk factors for hepatitis C.  Sexually transmitted infections (STIs)  You should be screened for sexually transmitted infections (STIs) including gonorrhea and chlamydia if: ? You are sexually active and are younger than 68 years of age. ? You are older than 68 years of age and your health care provider tells you that you are at risk for this type of infection. ? Your sexual activity has changed since you were last screened and you are at an increased risk for chlamydia or gonorrhea. Ask your health care provider if you are at risk.  If you do not have HIV, but are at risk, it may be recommended that you take a prescription medicine daily to prevent HIV infection. This is called pre-exposure prophylaxis (PrEP). You are considered at risk if: ? You are sexually active and do not regularly use condoms or know the HIV status of your partner(s). ? You take drugs by injection. ? You are sexually active with a partner who has HIV.  Talk with your health care provider about whether you are at high risk of being infected with HIV. If you choose to begin PrEP, you should first be tested for HIV. You  should then be tested every 3 months for as long as you are taking PrEP. Pregnancy  If you are premenopausal and you may become pregnant, ask your health care provider about preconception counseling.  If you may become pregnant, take 400 to 800 micrograms (mcg) of folic acid every day.  If you want to prevent pregnancy, talk to your health care provider about birth control (contraception). Osteoporosis and menopause  Osteoporosis is a disease in which the bones lose minerals and strength with aging. This can result in serious bone fractures. Your risk for osteoporosis can be identified using a bone density scan.  If you are 26 years of age or older, or if you are at risk for osteoporosis and fractures, ask your health care provider if you should be screened.  Ask your health care provider whether you should take a calcium or vitamin D supplement to lower your risk for osteoporosis.  Menopause may have certain physical symptoms and risks.  Hormone replacement therapy may reduce some of these symptoms and risks. Talk to your health care provider about whether hormone replacement therapy is right for you. Follow these instructions at home:  Schedule regular health, dental, and eye exams.  Stay current with your immunizations.  Do not use any tobacco products including cigarettes, chewing tobacco, or electronic cigarettes.  If you are pregnant, do not drink alcohol.  If you are breastfeeding, limit how much and how often you drink alcohol.  Limit alcohol intake to no more than 1 drink per day for nonpregnant women. One drink equals 12 ounces of beer, 5 ounces of wine, or 1 ounces of hard liquor.  Do not use street drugs.  Do not  share needles.  Ask your health care provider for help if you need support or information about quitting drugs.  Tell your health care provider if you often feel depressed.  Tell your health care provider if you have ever been abused or do not feel safe  at home. This information is not intended to replace advice given to you by your health care provider. Make sure you discuss any questions you have with your health care provider. Document Released: 02/08/2011 Document Revised: 01/01/2016 Document Reviewed: 04/29/2015 Elsevier Interactive Patient Education  2018 [place] A mammogram is an X-ray of the breasts that is done to check for abnormal changes. This procedure can screen for and detect any changes that may suggest breast cancer. A mammogram can also identify other changes and variations in the breast, such as:  Inflammation of the breast tissue (mastitis).  An infected area that contains a collection of pus (abscess).  A fluid-filled sac (cyst).  Fibrocystic changes. This is when breast tissue becomes denser, which can make the tissue feel rope-like or uneven under the skin.  Tumors that are not cancerous (benign).  Tell a health care provider about:  Any allergies you have.  If you have breast implants.  If you have had previous breast disease, biopsy, or surgery.  If you are breastfeeding.  Any possibility that you could be pregnant, if this applies.  If you are younger than age 60.  If you have a family history of breast cancer. What are the risks? Generally, this is a safe procedure. However, problems may occur, including:  Exposure to radiation. Radiation levels are very low with this test.  The results being misinterpreted.  The need for further tests.  The inability of the mammogram to detect certain cancers.  What happens before the procedure?  Schedule your test about 1-2 weeks after your menstrual period. This is usually when your breasts are the least tender.  If you have had a mammogram done at a different facility in the past, get the mammogram X-rays or have them sent to your current exam facility in order to compare them.  Wash your breasts and under your arms the day of the  test.  Do not wear deodorants, perfumes, lotions, or powders anywhere on your body on the day of the test.  Remove any jewelry from your neck.  Wear clothes that you can change into and out of easily. What happens during the procedure?  You will undress from the waist up and put on a gown.  You will stand in front of the X-ray machine.  Each breast will be placed between two plastic or glass plates. The plates will compress your breast for a few seconds. Try to stay as relaxed as possible during the procedure. This does not cause any harm to your breasts and any discomfort you feel will be very brief.  X-rays will be taken from different angles of each breast. The procedure may vary among health care providers and hospitals. What happens after the procedure?  The mammogram will be examined by a specialist (radiologist).  You may need to repeat certain parts of the test, depending on the quality of the images. This is commonly done if the radiologist needs a better view of the breast tissue.  Ask when your test results will be ready. Make sure you get your test results.  You may resume your normal activities. This information is not intended to replace advice given to you by  your health care provider. Make sure you discuss any questions you have with your health care provider. Document Released: 07/23/2000 Document Revised: 12/29/2015 Document Reviewed: 10/04/2014 Elsevier Interactive Patient Education  [employer].

## 2017-12-16 NOTE — Assessment & Plan Note (Signed)
Continue Vit D 1000 U daily. Further recommendations will be given according to lab results.

## 2017-12-16 NOTE — Patient Instructions (Signed)
A few things to remember from today's visit:   Essential hypertension  Other specified anxiety disorders  Vitamin D deficiency, unspecified - Plan: VITAMIN D 25 Hydroxy (Vit-D Deficiency, Fractures)  No changes today.  Please be sure medication list is accurate. If a new problem present, please set up appointment sooner than planned today.

## 2017-12-16 NOTE — Assessment & Plan Note (Addendum)
Adequately controlled. No changes in current management. DASH-low salt diet to continue. Eye exam recommended annually. F/U in 5 months, before if needed.

## 2017-12-16 NOTE — Assessment & Plan Note (Signed)
Stable. Tolerating medications well. No changes in current management:Cymbalta 60 mg and Alprazolam 0.25 mg daily prn. F/U in 5-6 months,before if needed.

## 2017-12-17 NOTE — Assessment & Plan Note (Signed)
Encouraged to continue exercise and nutritional program. Daily walking as tolerated. Consistency is important in order to see results.

## 2017-12-20 DIAGNOSIS — Z9842 Cataract extraction status, left eye: Secondary | ICD-10-CM | POA: Diagnosis not present

## 2017-12-20 DIAGNOSIS — H2512 Age-related nuclear cataract, left eye: Secondary | ICD-10-CM | POA: Diagnosis not present

## 2017-12-20 DIAGNOSIS — H25812 Combined forms of age-related cataract, left eye: Secondary | ICD-10-CM | POA: Diagnosis not present

## 2017-12-20 NOTE — Progress Notes (Signed)
I have reviewed documentation from this visit and I agree with recommendations given.  Beuford Garcilazo G. Selim Durden, MD  [facility]. [place] office.   

## 2017-12-29 ENCOUNTER — Encounter: Payer: Self-pay | Admitting: Family Medicine

## 2018-01-05 ENCOUNTER — Encounter: Payer: Self-pay | Admitting: Family Medicine

## 2018-01-05 ENCOUNTER — Other Ambulatory Visit: Payer: Self-pay | Admitting: Family Medicine

## 2018-01-05 DIAGNOSIS — G4700 Insomnia, unspecified: Secondary | ICD-10-CM

## 2018-01-05 MED ORDER — PROPRANOLOL HCL 40 MG PO TABS: 40.0000 mg | ORAL_TABLET | Freq: Two times a day (BID) | ORAL | 2 refills | Status: DC

## 2018-01-09 DIAGNOSIS — H2511 Age-related nuclear cataract, right eye: Secondary | ICD-10-CM | POA: Diagnosis not present

## 2018-01-09 DIAGNOSIS — H25011 Cortical age-related cataract, right eye: Secondary | ICD-10-CM | POA: Diagnosis not present

## 2018-01-10 DIAGNOSIS — Z780 Asymptomatic menopausal state: Secondary | ICD-10-CM | POA: Diagnosis not present

## 2018-01-10 DIAGNOSIS — M8588 Other specified disorders of bone density and structure, other site: Secondary | ICD-10-CM | POA: Diagnosis not present

## 2018-01-17 DIAGNOSIS — H2511 Age-related nuclear cataract, right eye: Secondary | ICD-10-CM | POA: Diagnosis not present

## 2018-01-17 DIAGNOSIS — H25811 Combined forms of age-related cataract, right eye: Secondary | ICD-10-CM | POA: Diagnosis not present

## 2018-01-17 DIAGNOSIS — H25011 Cortical age-related cataract, right eye: Secondary | ICD-10-CM | POA: Diagnosis not present

## 2018-01-18 ENCOUNTER — Other Ambulatory Visit: Payer: Self-pay

## 2018-01-18 ENCOUNTER — Emergency Department
Admission: EM | Admit: 2018-01-18 | Discharge: 2018-01-18 | Disposition: A | Discharge status: Home / Self Care | Payer: Medicare Other | Attending: Emergency Medicine | Admitting: Emergency Medicine

## 2018-01-18 ENCOUNTER — Encounter: Payer: Self-pay | Admitting: Emergency Medicine

## 2018-01-18 DIAGNOSIS — Z87891 Personal history of nicotine dependence: Secondary | ICD-10-CM | POA: Insufficient documentation

## 2018-01-18 DIAGNOSIS — I10 Essential (primary) hypertension: Secondary | ICD-10-CM | POA: Insufficient documentation

## 2018-01-18 DIAGNOSIS — R42 Dizziness and giddiness: Secondary | ICD-10-CM | POA: Diagnosis not present

## 2018-01-18 DIAGNOSIS — F419 Anxiety disorder, unspecified: Secondary | ICD-10-CM | POA: Diagnosis not present

## 2018-01-18 DIAGNOSIS — Z85828 Personal history of other malignant neoplasm of skin: Secondary | ICD-10-CM | POA: Insufficient documentation

## 2018-01-18 DIAGNOSIS — J449 Chronic obstructive pulmonary disease, unspecified: Secondary | ICD-10-CM | POA: Insufficient documentation

## 2018-01-18 DIAGNOSIS — R112 Nausea with vomiting, unspecified: Secondary | ICD-10-CM | POA: Diagnosis not present

## 2018-01-18 DIAGNOSIS — R110 Nausea: Secondary | ICD-10-CM | POA: Diagnosis not present

## 2018-01-18 DIAGNOSIS — F329 Major depressive disorder, single episode, unspecified: Secondary | ICD-10-CM | POA: Diagnosis not present

## 2018-01-18 DIAGNOSIS — E860 Dehydration: Secondary | ICD-10-CM | POA: Diagnosis not present

## 2018-01-18 DIAGNOSIS — Z79899 Other long term (current) drug therapy: Secondary | ICD-10-CM | POA: Diagnosis not present

## 2018-01-18 DIAGNOSIS — R1111 Vomiting without nausea: Secondary | ICD-10-CM | POA: Diagnosis not present

## 2018-01-18 DIAGNOSIS — Z9049 Acquired absence of other specified parts of digestive tract: Secondary | ICD-10-CM | POA: Diagnosis not present

## 2018-01-18 LAB — CBC WITH DIFFERENTIAL/PLATELET
BASOS ABS: 0 10*3/uL (ref 0–0.1)
Basophils Relative: 0 %
EOS ABS: 0.1 10*3/uL (ref 0–0.7)
EOS PCT: 1 %
HCT: 42.8 % (ref 35.0–47.0)
Hemoglobin: 14.6 g/dL (ref 12.0–16.0)
LYMPHS ABS: 1 10*3/uL (ref 1.0–3.6)
Lymphocytes Relative: 9 %
MCH: 31.1 pg (ref 26.0–34.0)
MCHC: 34.1 g/dL (ref 32.0–36.0)
MCV: 91.3 fL (ref 80.0–100.0)
MONO ABS: 0.5 10*3/uL (ref 0.2–0.9)
Monocytes Relative: 4 %
Neutro Abs: 10 10*3/uL — ABNORMAL HIGH (ref 1.4–6.5)
Neutrophils Relative %: 86 %
PLATELETS: 308 10*3/uL (ref 150–440)
RBC: 4.69 MIL/uL (ref 3.80–5.20)
RDW: 13.1 % (ref 11.5–14.5)
WBC: 11.6 10*3/uL — AB (ref 3.6–11.0)

## 2018-01-18 LAB — COMPREHENSIVE METABOLIC PANEL
ALT: 33 U/L (ref 14–54)
AST: 37 U/L (ref 15–41)
Albumin: 4.2 g/dL (ref 3.5–5.0)
Alkaline Phosphatase: 88 U/L (ref 38–126)
Anion gap: 11 (ref 5–15)
BUN: 14 mg/dL (ref 6–20)
CHLORIDE: 105 mmol/L (ref 101–111)
CO2: 21 mmol/L — AB (ref 22–32)
CREATININE: 1.07 mg/dL — AB (ref 0.44–1.00)
Calcium: 9.1 mg/dL (ref 8.9–10.3)
GFR, EST NON AFRICAN AMERICAN: 52 mL/min — AB (ref >60)
Glucose, Bld: 156 mg/dL — ABNORMAL HIGH (ref 65–99)
POTASSIUM: 3.6 mmol/L (ref 3.5–5.1)
SODIUM: 137 mmol/L (ref 135–145)
Total Bilirubin: 1.1 mg/dL (ref 0.3–1.2)
Total Protein: 7.6 g/dL (ref 6.5–8.1)

## 2018-01-18 LAB — TROPONIN I: Troponin I: <0.03 ng/mL (ref <0.03)

## 2018-01-18 MED ORDER — SODIUM CHLORIDE 0.9 % IV BOLUS
1000.0000 mL | Freq: Once | INTRAVENOUS | Status: AC
  Administered 2018-01-18: 1000 mL via INTRAVENOUS

## 2018-01-18 MED ORDER — PROMETHAZINE HCL 25 MG/ML IJ SOLN
12.5000 mg | Freq: Once | INTRAMUSCULAR | Status: AC
  Administered 2018-01-18: 12.5 mg via INTRAVENOUS

## 2018-01-18 MED ORDER — ONDANSETRON HCL 4 MG/2ML IJ SOLN: 4.0000 mg | Freq: Once | INTRAMUSCULAR | Status: DC

## 2018-01-18 MED ORDER — PROMETHAZINE HCL 25 MG/ML IJ SOLN
INTRAMUSCULAR | Status: AC
  Administered 2018-01-18: 12.5 mg via INTRAVENOUS
  Filled 2018-01-18: qty 1

## 2018-01-18 NOTE — ED Provider Notes (Addendum)
[facility] Emergency Department Provider Note  ____________________________________________  Time seen: Approximately 1:52 PM  I have reviewed the triage vital signs and the nursing notes.   HISTORY  Chief Complaint Emesis   HPI Kristina [name] is a 67 y.o. female with history of vertigo, hyperlipidemia, anxiety, depression, COPD, hypothyroidism who presents for evaluation of dizziness.  Patient reports that yesterday she had cataract surgery on her right eye.  She was doing well until 11 AM this morning when she developed the sudden onset of severe nausea and felt warm.  She reports that she try to take 2 meclizine's because she has a history of vertigo and she was afraid the vertigo would start.  That did not help and every time she turned around in bed she would get severe nausea and feels extremely lightheaded.  She describes this as mild room spinning but mostly feeling like she was going to pass out.  She reports that she nearly had a syncopal event.  She then started vomiting and has had 5 episodes of nonbloody nonbilious emesis.  She tried to take Phenergan but she was unable to keep that down.  Patient reports having 2 similar episodes in the past.  She reports that her first episode was a few years ago when she was hospitalized and had an extensive cardiac evaluation and followed outpatient with the cardiologist but no etiology was ever determined.  Patient reports several weeks ago she had a similar episode but that resolved at home.  She denies headache, dysphasia, dysarthria, diplopia, gait instability, unilateral weakness or numbness.  She does report seeing halos and light flashes however these symptoms started yesterday after the cataract surgery and according to the eye doctor those are expected after the surgery.  Patient denies any chest pain or shortness of breath.  Past Medical History:  Diagnosis Date  . Anxiety   . Arthritis   . Cancer ([place])    basal and squamous cell skin  . Chicken pox   . COPD (chronic obstructive pulmonary disease) ([place])   . Depression   . GERD (gastroesophageal reflux disease)   . Migraines   . Thyroid disease     Patient Active Problem List   Diagnosis Date Noted  . Vitamin D deficiency, unspecified 12/16/2017  . Class 1 obesity with body mass index (BMI) of 31.0 to 31.9 in adult 12/16/2017  . Benign essential tremor 07/26/2017  . Thyroid nodule 07/04/2017  . Essential hypertension 06/07/2017  . GERD (gastroesophageal reflux disease) 03/30/2016  . 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 Anxiety disorder 03/29/2016  . Insomnia 03/29/2016  . Osteopenia 03/29/2016  . Migraine headache without aura 03/29/2016  . Hyperlipidemia 03/29/2016  . History of basal cell carcinoma 03/29/2016  . Generalized osteoarthritis of multiple sites 03/29/2016  . Back pain, chronic 03/29/2016    Past Surgical History:  Procedure Laterality Date  . ABDOMINAL HYSTERECTOMY  1978  . CATARACT EXTRACTION Left 12/20/2017   will have the right one completed a month later   . CHOLECYSTECTOMY  2000  . COLONOSCOPY  2007  . JOINT REPLACEMENT Right 08/19/2017   great toe   . UPPER GASTROINTESTINAL ENDOSCOPY  [id number]    Prior to Admission medications   Medication Sig Start Date End Date Taking? Authorizing Provider  albuterol (PROVENTIL HFA;VENTOLIN HFA) 108 (90 Base) MCG/ACT inhaler Inhale 2 puffs into the lungs every 6 (six) hours as needed for wheezing or shortness of breath. 11/16/16   [place], Betty G, MD  ALPRAZolam (  XANAX) 0.25 MG tablet Take 1 tablet (0.25 mg total) by mouth at bedtime. 11/08/17   [place], Betty G, MD  Azelaic Acid (FINACEA) 15 % cream Apply 1 application topically 2 (two) times daily. After skin is thoroughly washed and patted dry, gently but thoroughly massage a thin film of azelaic acid cream into the affected area twice daily, in the morning and evening.    [provider]  brimonidine (ALPHAGAN) 0.2  % ophthalmic solution  12/07/17   [provider]  cyclobenzaprine (FLEXERIL) 10 MG tablet Take 1 tablet (10 mg total) by mouth daily. 11/15/17   [place], Betty G, MD  diclofenac (VOLTAREN) 50 MG EC tablet Take 50 mg by mouth daily.    [provider]  diclofenac sodium (VOLTAREN) 1 % GEL Apply 3 gm to 3 large joints up to 3 times a day.Dispense 3 tubes with 3 refills. 10/07/17   Bo Merino, MD  DULoxetine (CYMBALTA) 60 MG capsule Take 1 capsule (60 mg total) by mouth daily. 07/26/17   [place], Betty G, MD  ketorolac Nancie Neas) 0.5 % ophthalmic solution  12/07/17   [provider]  levothyroxine (SYNTHROID, LEVOTHROID) 25 MCG tablet Take 1 tablet (25 mcg total) by mouth daily before breakfast. 11/15/17   [place], Betty G, MD  losartan (COZAAR) 50 MG tablet Take 1 tablet (50 mg total) by mouth daily. 06/07/17 10/07/17  Skeet Latch, MD  Magnesium 250 MG TABS Take by mouth daily.    [provider]  meclizine (ANTIVERT) 12.5 MG tablet Take 1 tablet (12.5 mg total) by mouth 3 (three) times daily as needed for dizziness. 03/30/16   [place], Betty G, MD  Multiple Vitamin (MULTIVITAMIN) tablet Take 1 tablet by mouth daily.    [provider]  ofloxacin (OCUFLOX) 0.3 % ophthalmic solution  12/07/17   [provider]  omeprazole (PRILOSEC) 40 MG capsule TAKE 1 CAPSULE EVERY DAY 08/10/17   [place], Betty G, MD  prednisoLONE acetate (PRED FORTE) 1 % ophthalmic suspension  12/07/17   [provider]  propranolol (INDERAL) 40 MG tablet Take 1 tablet (40 mg total) by mouth 2 (two) times daily. 01/05/18   [place], Betty G, MD  vitamin B-12 (CYANOCOBALAMIN) 1000 MCG tablet Take 1,000 mcg by mouth once a week.    [provider]    Allergies Meloxicam; Hydrocodone; and Sulfa antibiotics  Family History  Problem Relation Age of Onset  . Arthritis Mother   . Diabetes Mother   . COPD Mother   . Heart failure Mother   . Stroke Father   . Hypertension  Father   . Hyperlipidemia Father   . Diabetes Father   . CAD Father   . Breast cancer Maternal Aunt   . Cancer Sister        bladder cancer   . Throat cancer Brother   . Colon cancer Neg Hx   . Stomach cancer Neg Hx   . Esophageal cancer Neg Hx     Social History Social History   Tobacco Use  . Smoking status: Former Smoker    Packs/day: 1.50    Years: 25.00    Pack years: 37.50    Last attempt to quit: 11/02/1990    Years since quitting: 27.2  . Smokeless tobacco: Never Used  Substance Use Topics  . Alcohol use: Yes    Comment: glass of wine once a month  . Drug use: No    Review of Systems  Constitutional: Negative for fever. + dizziness  Eyes: Negative for visual changes. ENT: Negative for sore throat. Neck: No neck pain  Cardiovascular: Negative for chest pain. Respiratory: Negative for shortness of breath. Gastrointestinal: Negative for abdominal pain,  Diarrhea. + N/V Genitourinary: Negative for dysuria. Musculoskeletal: Negative for back pain. Skin: Negative for rash. Neurological: Negative for headaches, weakness or numbness. Psych: No SI or HI  ____________________________________________   PHYSICAL EXAM:  VITAL SIGNS: ED Triage Vitals  Enc Vitals Group     BP 01/18/18 1347 (!) 158/83     Pulse Rate 01/18/18 1347 67     Resp 01/18/18 1347 18     Temp 01/18/18 1347 97.8 F (36.6 C)     Temp Source 01/18/18 1347 Oral     SpO2 01/18/18 1347 100 %     Weight 01/18/18 1350 169 lb (76.7 kg)     Height 01/18/18 1350 5\' 2"  (1.575 m)     Head Circumference --      Peak Flow --      Pain Score 01/18/18 1349 0     Pain Loc --      Pain Edu? --      Excl. in [place]? --     Constitutional: Alert and oriented. Well appearing and in no apparent distress. HEENT:      Head: Normocephalic and atraumatic.         Eyes: Conjunctivae are normal. Sclera is non-icteric.       Mouth/Throat: Mucous membranes are moist.       Neck: Supple with no signs of  meningismus. Cardiovascular: Regular rate and rhythm. No murmurs, gallops, or rubs. 2+ symmetrical distal pulses are present in all extremities. No JVD. Respiratory: Normal respiratory effort. Lungs are clear to auscultation bilaterally. No wheezes, crackles, or rhonchi.  Gastrointestinal: Soft, non tender, and non distended with positive bowel sounds. No rebound or guarding. Musculoskeletal: Nontender with normal range of motion in all extremities. No edema, cyanosis, or erythema of extremities. Neurologic: Normal speech and language. A & O x3, PERRL, EOMI, no unidirectional horizontal fatiguable nystagmus at end gaze bilaterally, CN II-XII intact, motor testing reveals good tone and bulk throughout. There is no evidence of pronator drift or dysmetria. Muscle strength is 5/5 throughout.  Sensory examination is intact. Gait deferred due to lightheadedness Skin: Skin is warm, dry and intact. No rash noted. Psychiatric: Mood and affect are normal. Speech and behavior are normal.  ____________________________________________   LABS (all labs ordered are listed, but only abnormal results are displayed)  Labs Reviewed  CBC WITH DIFFERENTIAL/PLATELET - Abnormal; Notable for the following components:      Result Value   WBC 11.6 (*)    Neutro Abs 10.0 (*)    All other components within normal limits  COMPREHENSIVE METABOLIC PANEL - Abnormal; Notable for the following components:   CO2 21 (*)    Glucose, Bld 156 (*)    Creatinine, Ser 1.07 (*)    GFR calc non Af Amer 52 (*)    All other components within normal limits  TROPONIN I   ____________________________________________  EKG  ED ECG REPORT I, Rudene Re, the attending physician, personally viewed and interpreted this ECG.  Normal sinus rhythm, rate of 16, nonspecific intraventricular conduction delay, borderline QTC prolongation, minimal ST depressions in inferior and lateral leads which are seen in prior EKG however look  slightly more pronounced today, no ST elevations. ____________________________________________  RADIOLOGY  none  ____________________________________________   PROCEDURES  Procedure(s) performed: None Procedures Critical Care performed:  None ____________________________________________   INITIAL IMPRESSION / ASSESSMENT AND PLAN / ED COURSE  67 y.o. female with history of vertigo, hyperlipidemia, anxiety, depression, COPD, hypothyroidism who presents for evaluation of dizziness, nausea, and vomiting.  Patient reports that her dizziness is mostly lightheaded and feeling like she is going to pass out however she does report having may be mild room spinning component.  She reports this is nothing similar to her prior episodes of vertigo which usually do not cause her to vomit.  She is postop day 1 from right sided cataract surgery.  Eye exam shows no abnormalities with pupils equal round and reactive, no injected conjunctiva, normal visual fields, and a fatigable unidirectional horizontal nystagmus at end gaze.  Patient is also completely neurologically intact on exam.  She is actively vomiting but her vitals are within normal limits.  We will get an EKG to rule out ischemia or arrhythmias, labs to rule out electrolyte abnormalities, dehydration, acute kidney injury, anemia.  Will get troponin to evaluate for ischemia.  Will give IV fluids, Zofran and meclizine.  At this time low suspicion for intracranial pathology as patient is neurologically intact.   Clinical Course as of Jan 18 1558  Wed Jan 18, 2018  1549 Patient feels markedly improved.  No longer having dizziness, tolerating PO.  Remains neurologically intact.  Her labs are consistent with mild dehydration.  Patient reports that she was n.p.o. 2 days ago for her surgery and yesterday she slept most of the day and did not really drink or eat much.  After fluids and Phenergan she feels back to her baseline.  I ambulated the patient and she  had no difficulty with her walking and no recurrence of her dizziness.  Patient's presentation is concerning for peripheral vertigo in the setting of recent eye surgery and mild dehydration.  At this time patient is stable for discharge.  She has a postop appointment with her eye doctor tomorrow.  Discussed return precautions and recommended close follow-up with primary care doctor.   [CV]    Clinical Course User Index [CV] Alfred Levins [place], MD     As part of my medical decision making, I reviewed the following data within the [place] notes reviewed and incorporated, Labs reviewed , EKG interpreted  Old EKG reviewed, Old chart reviewed, Notes from prior ED visits and [place] Controlled Substance Database    Pertinent labs & imaging results that were available during my care of the patient were reviewed by me and considered in my medical decision making (see chart for details).    ____________________________________________   FINAL CLINICAL IMPRESSION(S) / ED DIAGNOSES  Final diagnoses:  Non-intractable vomiting with nausea, unspecified vomiting type  Dizziness  Dehydration      NEW MEDICATIONS STARTED DURING THIS VISIT:  ED Discharge Orders    None       Note:  This document was prepared using Dragon voice recognition software and may include unintentional dictation errors.    Alfred Levins, [place], MD 01/18/18 [place], [place], MD 01/18/18 [phone]

## 2018-01-18 NOTE — ED Notes (Signed)
Marya Amsler, RN present at bedside to attempt ultrasound-guided IV access.

## 2018-01-18 NOTE — ED Triage Notes (Signed)
Pt arrives via [facility] from home with complaints of sudden onset dizziness with nausea and vomiting that began at 10am. Pt has hx of vertigo but per patient, this is different - not typically nauseous with vertigo. Is prescribed 25mg  phenergan PO and did take one PTA but has vomited several times since. Denies pain. No unilateral weakness present. Pt states she feels weak all over but has no deficits.

## 2018-03-02 ENCOUNTER — Encounter: Payer: Self-pay | Admitting: Family Medicine

## 2018-03-03 ENCOUNTER — Other Ambulatory Visit: Payer: Self-pay | Admitting: *Deleted

## 2018-03-03 ENCOUNTER — Telehealth: Payer: Self-pay | Admitting: *Deleted

## 2018-03-03 DIAGNOSIS — K219 Gastro-esophageal reflux disease without esophagitis: Secondary | ICD-10-CM

## 2018-03-03 MED ORDER — LOSARTAN POTASSIUM 50 MG PO TABS: 50.0000 mg | ORAL_TABLET | Freq: Every day | ORAL | 1 refills | Status: DC

## 2018-03-03 MED ORDER — OMEPRAZOLE 40 MG PO CPDR
40.0000 mg | DELAYED_RELEASE_CAPSULE | Freq: Every day | ORAL | 2 refills | Status: DC
Start: 2018-03-03 — End: 2018-08-23

## 2018-03-03 NOTE — Telephone Encounter (Signed)
Refill request for Duloxetine DR 60 mg #90, sent to [facility]

## 2018-03-06 ENCOUNTER — Other Ambulatory Visit: Payer: Self-pay | Admitting: Family Medicine

## 2018-03-06 DIAGNOSIS — F418 Other specified anxiety disorders: Secondary | ICD-10-CM

## 2018-03-06 DIAGNOSIS — M159 Polyosteoarthritis, unspecified: Secondary | ICD-10-CM

## 2018-03-06 MED ORDER — DULOXETINE HCL 60 MG PO CPEP: 60.0000 mg | ORAL_CAPSULE | Freq: Every day | ORAL | 1 refills | Status: DC

## 2018-03-06 NOTE — Telephone Encounter (Signed)
Rx for Cymbalta 60 mg sent to her pharmacy as requested.  Thanks, BJ

## 2018-03-07 NOTE — Telephone Encounter (Signed)
Mychart message sent to patient letting her know that her Rx was sent to the pharmacy.

## 2018-04-03 ENCOUNTER — Encounter: Payer: Self-pay | Admitting: Family Medicine

## 2018-04-03 MED ORDER — LEVOTHYROXINE SODIUM 25 MCG PO TABS: 25.0000 ug | ORAL_TABLET | Freq: Every day | ORAL | 0 refills | Status: DC

## 2018-04-11 ENCOUNTER — Encounter: Payer: Self-pay | Admitting: Family Medicine

## 2018-04-11 ENCOUNTER — Other Ambulatory Visit: Payer: Self-pay | Admitting: Family Medicine

## 2018-04-11 DIAGNOSIS — G4700 Insomnia, unspecified: Secondary | ICD-10-CM

## 2018-04-11 MED ORDER — ALPRAZOLAM 0.25 MG PO TABS: 0.2500 mg | ORAL_TABLET | Freq: Every evening | ORAL | 3 refills | Status: DC | PRN

## 2018-05-05 DIAGNOSIS — Z23 Encounter for immunization: Secondary | ICD-10-CM | POA: Diagnosis not present

## 2018-05-18 NOTE — Progress Notes (Signed)
HPI:   [name] is a 67 y.o. female, who is here today for 5 months follow up.   She was last seen on 12/16/17. Since her last OV she has been in the ER, 01/18/18 ,due to nausea and vomiting a day after second eye surgery.  Renal function was mildly abnormal. Denies gross hematuria,decreased urine output,or foam in urine.   Hypertension:   Currently on Cozaar 50 mg daily and inderal 40 mg bid, the latter one added also to help with tremor.  She is taking medications as instructed, no side effects reported. She has not noted unusual headache, visual changes, exertional chest pain, dyspnea,  focal weakness, or edema.   Lab Results  Component Value Date   CREATININE 1.07 (H) 01/18/2018   BUN 14 01/18/2018   NA 137 01/18/2018   K 3.6 01/18/2018   CL 105 01/18/2018   CO2 21 (L) 01/18/2018   Essential tremor: She has followed with neurologist, Dr. Carles Collet. Currently she is on Inderal 40 mg twice daily, which is still helping. Problem is aggravated by activities that required hand movement, like holding things up or witting.  Generalized OA and anxiety. She followed with rheumatologist.  According to pt, her rheumatologist recommended to stop Diclofenac, she is still taking it but ones daily.   She is on Cymbalta 60 mg daily. She also takes Alprazolam 0.25 mg daily as needed.  Hypothyroidism:  Currently she is on levothyroxine 25 mcg daily. Tolerating medication well, no side effects reported. She has not noted dysphagia, palpitations, abdominal pain, changes in bowel habits, tremor, cold/heat intolerance, or abnormal weight loss.   Lab Results  Component Value Date   TSH 5.040 (H) 08/04/2017   Today she has a few concerns.  Hot flashes, intermittently for a few years,1-2 times per day. She has not identified exacerbating or alleviating factors.  Fatigue, which seems to be getting worse for the past 6 months. She is taking naps sometimes.   She is  reporting history of B12 deficiency, in the past she took B12 supplementation.  She is frustrated about not being able to keep weight down. She has not been consistent with a healthy diet or regular exercise.   Glucose elevated at 156 on 01/18/18.  Mid 03/2018 she was on there phone having a conversation with a friend, suddenly she "could not say what she wanted to say, did not know what to say", she could find words, 5-5 times during phone call. Her friend noted she was having difficulty. She has not had any similar problem since then. Negative for headache or focal deficit. Somebody suggested that symptoms could be due to TIA.  She lives alone.   Review of Systems  Constitutional: Positive for fatigue. Negative for activity change, appetite change, fever and unexpected weight change.  HENT: Negative for mouth sores, nosebleeds and trouble swallowing.   Eyes: Negative for redness and visual disturbance.  Respiratory: Negative for cough, shortness of breath and wheezing.   Cardiovascular: Negative for chest pain, palpitations and leg swelling.  Gastrointestinal: Negative for abdominal pain, nausea and vomiting.       Negative for changes in bowel habits.  Endocrine: Negative for cold intolerance and heat intolerance.  Genitourinary: Negative for decreased urine volume, difficulty urinating, dysuria and hematuria.  Musculoskeletal: Positive for arthralgias. Negative for gait problem and joint swelling.  Skin: Negative for rash and wound.  Neurological: Negative for seizures, syncope, weakness and headaches.  Psychiatric/Behavioral: Positive for confusion. Negative  for hallucinations. The patient is nervous/anxious.      Current Outpatient Medications on File Prior to Visit  Medication Sig Dispense Refill  . albuterol (PROVENTIL HFA;VENTOLIN HFA) 108 (90 Base) MCG/ACT inhaler Inhale 2 puffs into the lungs every 6 (six) hours as needed for wheezing or shortness of breath. 1 Inhaler 0  .  ALPRAZolam (XANAX) 0.25 MG tablet Take 1 tablet (0.25 mg total) by mouth at bedtime as needed for anxiety or sleep. 30 tablet 3  . Azelaic Acid (FINACEA) 15 % cream Apply 1 application topically 2 (two) times daily. After skin is thoroughly washed and patted dry, gently but thoroughly massage a thin film of azelaic acid cream into the affected area twice daily, in the morning and evening.    . cyclobenzaprine (FLEXERIL) 10 MG tablet Take 1 tablet (10 mg total) by mouth daily. 90 tablet 1  . diclofenac (VOLTAREN) 50 MG EC tablet Take 50 mg by mouth daily.    . diclofenac sodium (VOLTAREN) 1 % GEL Apply 3 gm to 3 large joints up to 3 times a day.Dispense 3 tubes with 3 refills. 3 Tube 1  . DULoxetine (CYMBALTA) 60 MG capsule Take 1 capsule (60 mg total) by mouth daily. 90 capsule 1  . estradiol (VIVELLE-DOT) 0.0375 MG/24HR APPLY 1 PATCH TOPICALLY TWICE A WEEK  8  . levothyroxine (SYNTHROID, LEVOTHROID) 25 MCG tablet Take 1 tablet (25 mcg total) by mouth daily before breakfast. 90 tablet 0  . losartan (COZAAR) 50 MG tablet Take 1 tablet (50 mg total) by mouth daily. 90 tablet 1  . Magnesium 250 MG TABS Take by mouth daily.    . meclizine (ANTIVERT) 12.5 MG tablet Take 1 tablet (12.5 mg total) by mouth 3 (three) times daily as needed for dizziness. 60 tablet 1  . Multiple Vitamin (MULTIVITAMIN) tablet Take 1 tablet by mouth daily.    Marland Kitchen omeprazole (PRILOSEC) 40 MG capsule Take 1 capsule (40 mg total) by mouth daily. 90 capsule 2  . propranolol (INDERAL) 40 MG tablet Take 1 tablet (40 mg total) by mouth 2 (two) times daily. 180 tablet 2  . vitamin B-12 (CYANOCOBALAMIN) 1000 MCG tablet Take 1,000 mcg by mouth once a week.     No current facility-administered medications on file prior to visit.      Past Medical History:  Diagnosis Date  . Anxiety   . Arthritis   . Cancer ([place])    basal and squamous cell skin  . Chicken pox   . COPD (chronic obstructive pulmonary disease) ([place])   . Depression     . GERD (gastroesophageal reflux disease)   . Migraines   . Thyroid disease    Allergies  Allergen Reactions  . Meloxicam Other (See Comments)    "skin infection on leg"  . Hydrocodone Nausea Only    Per patient   . Sulfa Antibiotics Rash    Rash all over body/fever    Social History   Socioeconomic History  . Marital status: Single    Spouse name: Not on file  . Number of children: Not on file  . Years of education: Not on file  . Highest education level: Not on file  Occupational History  . Occupation: retired    Comment: administration  Social Needs  . Financial resource strain: Not on file  . Food insecurity:    Worry: Not on file    Inability: Not on file  . Transportation needs:    Medical: Not on file  Non-medical: Not on file  Tobacco Use  . Smoking status: Former Smoker    Packs/day: 1.50    Years: 25.00    Pack years: 37.50    Last attempt to quit: 11/02/1990    Years since quitting: 27.5  . Smokeless tobacco: Never Used  Substance and Sexual Activity  . Alcohol use: Yes    Comment: glass of wine once a month  . Drug use: No  . Sexual activity: Never  Lifestyle  . Physical activity:    Days per week: Not on file    Minutes per session: Not on file  . Stress: Not on file  Relationships  . Social connections:    Talks on phone: Not on file    Gets together: Not on file    Attends religious service: Not on file    Active member of club or organization: Not on file    Attends meetings of clubs or organizations: Not on file    Relationship status: Not on file  Other Topics Concern  . Not on file  Social History Narrative  . Not on file    Vitals:   05/19/18 0943  BP: 124/83  Pulse: 77  Resp: 12  Temp: 98.3 F (36.8 C)  SpO2: 96%   Body mass index is 32.81 kg/m.  Wt Readings from Last 3 Encounters:  05/19/18 179 lb 6 oz (81.4 kg)  01/18/18 169 lb (76.7 kg)  12/16/17 173 lb (78.5 kg)     Physical Exam  Nursing note and vitals  reviewed. Constitutional: She is oriented to person, place, and time. She appears well-developed. No distress.  HENT:  Head: Normocephalic and atraumatic.  Mouth/Throat: Oropharynx is clear and moist and mucous membranes are normal.  Eyes: Pupils are equal, round, and reactive to light. Conjunctivae are normal.  Cardiovascular: Normal rate and regular rhythm.  No murmur heard. Pulses:      Dorsalis pedis pulses are 2+ on the right side, and 2+ on the left side.  Respiratory: Effort normal and breath sounds normal. No respiratory distress.  GI: Soft. She exhibits no mass. There is no hepatomegaly. There is no tenderness.  Musculoskeletal: She exhibits no edema.  Lymphadenopathy:    She has no cervical adenopathy.  Neurological: She is alert and oriented to person, place, and time. She has normal strength. No cranial nerve deficit. Gait normal.  Skin: Skin is warm. No rash noted. No erythema.  Psychiatric: Her mood appears anxious.  Well groomed, good eye contact.     ASSESSMENT AND PLAN:   Ms. Kristina [name] was seen today for 5 months follow-up.  Orders Placed This Encounter  Procedures  . MR Brain Wo Contrast  . TSH  . Hemoglobin A1c  . Basic metabolic panel  . Microalbumin / creatinine urine ratio  . Vitamin B12   Lab Results  Component Value Date   [id number] 05/19/2018   Lab Results  Component Value Date   TSH 1.86 05/19/2018   Lab Results  Component Value Date   MICROALBUR <0.7 05/19/2018   Lab Results  Component Value Date   CREATININE 1.02 05/19/2018   BUN 13 05/19/2018   NA 139 05/19/2018   K 4.4 05/19/2018   CL 104 05/19/2018   CO2 30 05/19/2018    Essential hypertension BP adequately controlled. No changes in Cozaar 50 mg daily or Inderal 40 mg twice daily. Continue low-salt diet. Monitor BP at home. Follow-up in 4 to 6 months.  Anxiety disorder Otherwise  stable but still symptomatic. No changes in Cymbalta 60 mg daily or alprazolam 0.25  mg daily as needed. Follow-up in 3 months.  Hypothyroidism No changes in current management, will follow labs done today and will give further recommendations accordingly.   Generalized osteoarthritis of multiple sites She is no longer following with rheumatologist. We discussed natural history of OA as well as treatment options. In general she has benefit from Cymbalta, so no changes in current management. We discussed side effects of NSAIDs, she is still taking diclofenac once daily.  Recommend stopping the Voltaren gel if she continues taking diclofenac.  B12 deficiency Further recommendation will be given according to B12 results.  Benign essential tremor Stable. She will continue with Inderal 40 mg twice daily. Follow-up in 6 to 12 months.  Hyperglycemia  Healthy life style for primary prevention of DM.  - Hemoglobin A1c  Altered mental status, unspecified altered mental status type  Possible etiologies discussed. ? TIA. Neurologic exam normal. Instructed about warning signs.  - MR Brain Wo Contrast; Future  Abnormal renal function test  Adequate hydration. Side effects of NSAID's discussed. Low salt diet.  - Microalbumin / creatinine urine ratio     Damien Batty G. [place], MD  [facility]. [place] office.

## 2018-05-19 ENCOUNTER — Telehealth: Payer: Self-pay | Admitting: Family Medicine

## 2018-05-19 ENCOUNTER — Encounter: Payer: Self-pay | Admitting: Family Medicine

## 2018-05-19 ENCOUNTER — Ambulatory Visit (INDEPENDENT_AMBULATORY_CARE_PROVIDER_SITE_OTHER): Payer: Medicare Other | Admitting: Family Medicine

## 2018-05-19 VITALS — BP 124/83 | HR 77 | Temp 98.3°F | Resp 12 | Ht 62.0 in | Wt 179.4 lb

## 2018-05-19 DIAGNOSIS — E538 Deficiency of other specified B group vitamins: Secondary | ICD-10-CM

## 2018-05-19 DIAGNOSIS — E039 Hypothyroidism, unspecified: Secondary | ICD-10-CM | POA: Insufficient documentation

## 2018-05-19 DIAGNOSIS — G250 Essential tremor: Secondary | ICD-10-CM | POA: Diagnosis not present

## 2018-05-19 DIAGNOSIS — R739 Hyperglycemia, unspecified: Secondary | ICD-10-CM | POA: Diagnosis not present

## 2018-05-19 DIAGNOSIS — M159 Polyosteoarthritis, unspecified: Secondary | ICD-10-CM | POA: Diagnosis not present

## 2018-05-19 DIAGNOSIS — R4182 Altered mental status, unspecified: Secondary | ICD-10-CM

## 2018-05-19 DIAGNOSIS — F418 Other specified anxiety disorders: Secondary | ICD-10-CM

## 2018-05-19 DIAGNOSIS — I10 Essential (primary) hypertension: Secondary | ICD-10-CM

## 2018-05-19 DIAGNOSIS — R944 Abnormal results of kidney function studies: Secondary | ICD-10-CM

## 2018-05-19 LAB — MICROALBUMIN / CREATININE URINE RATIO
CREATININE, U: 23 mg/dL
Microalb Creat Ratio: 3 mg/g (ref 0.0–30.0)

## 2018-05-19 LAB — BASIC METABOLIC PANEL
BUN: 13 mg/dL (ref 6–23)
CALCIUM: 8.9 mg/dL (ref 8.4–10.5)
CO2: 30 mEq/L (ref 19–32)
Chloride: 104 mEq/L (ref 96–112)
Creatinine, Ser: 1.02 mg/dL (ref 0.40–1.20)
GFR: 57.28 mL/min — AB (ref >60.00)
Glucose, Bld: 101 mg/dL — ABNORMAL HIGH (ref 70–99)
Potassium: 4.4 mEq/L (ref 3.5–5.1)
SODIUM: 139 meq/L (ref 135–145)

## 2018-05-19 LAB — TSH: TSH: 1.86 u[IU]/mL (ref 0.35–4.50)

## 2018-05-19 LAB — HEMOGLOBIN A1C: HEMOGLOBIN A1C: 5.7 % (ref 4.6–6.5)

## 2018-05-19 LAB — VITAMIN B12: Vitamin B-12: 838 pg/mL (ref 211–911)

## 2018-05-19 NOTE — Assessment & Plan Note (Signed)
Stable. She will continue with Inderal 40 mg twice daily. Follow-up in 6 to 12 months.

## 2018-05-19 NOTE — Telephone Encounter (Signed)
Copied from [place] [id number]. Topic: Referral - Question >> May 19, 2018  2:24 PM Burchel, Abbi R wrote: Pt requesting referral for MRI be sent to somewhere in [place], maybe [facility] outpatient instead of [facility].   [phone]

## 2018-05-19 NOTE — Assessment & Plan Note (Signed)
She is no longer following with rheumatologist. We discussed natural history of OA as well as treatment options. In general she has benefit from Cymbalta, so no changes in current management. We discussed side effects of NSAIDs, she is still taking diclofenac once daily.  Recommend stopping the Voltaren gel if she continues taking diclofenac.

## 2018-05-19 NOTE — Assessment & Plan Note (Signed)
BP adequately controlled. No changes in Cozaar 50 mg daily or Inderal 40 mg twice daily. Continue low-salt diet. Monitor BP at home. Follow-up in 4 to 6 months.

## 2018-05-19 NOTE — Assessment & Plan Note (Signed)
Otherwise stable but still symptomatic. No changes in Cymbalta 60 mg daily or alprazolam 0.25 mg daily as needed. Follow-up in 3 months.

## 2018-05-19 NOTE — Assessment & Plan Note (Signed)
No changes in current management, will follow labs done today and will give further recommendations accordingly.  

## 2018-05-19 NOTE — Patient Instructions (Signed)
A few things to remember from today's visit:   Essential hypertension - Plan: TSH, Basic metabolic panel  Benign essential tremor  Other specified anxiety disorders  Abnormal TSH  Hyperglycemia - Plan: Hemoglobin A1c  Altered mental status, unspecified altered mental status type - Plan: MR Brain Wo Contrast  Abnormal renal function test - Plan: Microalbumin / creatinine urine ratio  B12 deficiency - Plan: Vitamin B12   Please be sure medication list is accurate. If a new problem present, please set up appointment sooner than planned today.

## 2018-05-19 NOTE — Assessment & Plan Note (Signed)
Further recommendation will be given according to B12 results. 

## 2018-05-21 ENCOUNTER — Encounter: Payer: Self-pay | Admitting: Family Medicine

## 2018-05-24 ENCOUNTER — Other Ambulatory Visit: Payer: Self-pay | Admitting: *Deleted

## 2018-05-24 NOTE — Telephone Encounter (Signed)
MRI ordered

## 2018-05-24 NOTE — Addendum Note (Signed)
Addended by: Westley Hummer B on: 05/24/2018 12:01 PM   Modules accepted: Orders

## 2018-05-28 ENCOUNTER — Other Ambulatory Visit: Payer: Medicare Other

## 2018-06-09 ENCOUNTER — Other Ambulatory Visit: Payer: Self-pay | Admitting: *Deleted

## 2018-06-09 MED ORDER — LEVOTHYROXINE SODIUM 25 MCG PO TABS: 25.0000 ug | ORAL_TABLET | Freq: Every day | ORAL | 0 refills | Status: DC

## 2018-06-12 ENCOUNTER — Ambulatory Visit
Admission: RE | Admit: 2018-06-12 | Discharge: 2018-06-12 | Disposition: A | Discharge status: Home / Self Care | Payer: Medicare Other | Source: Ambulatory Visit | Attending: Family Medicine | Admitting: Family Medicine

## 2018-06-12 DIAGNOSIS — R4182 Altered mental status, unspecified: Secondary | ICD-10-CM | POA: Diagnosis not present

## 2018-06-12 DIAGNOSIS — I6782 Cerebral ischemia: Secondary | ICD-10-CM | POA: Insufficient documentation

## 2018-06-12 DIAGNOSIS — H43813 Vitreous degeneration, bilateral: Secondary | ICD-10-CM | POA: Diagnosis not present

## 2018-06-12 DIAGNOSIS — H5319 Other subjective visual disturbances: Secondary | ICD-10-CM | POA: Diagnosis not present

## 2018-06-13 ENCOUNTER — Encounter: Payer: Self-pay | Admitting: Family Medicine

## 2018-06-13 DIAGNOSIS — H1852 Epithelial (juvenile) corneal dystrophy: Secondary | ICD-10-CM | POA: Diagnosis not present

## 2018-06-13 DIAGNOSIS — H16223 Keratoconjunctivitis sicca, not specified as Sjogren's, bilateral: Secondary | ICD-10-CM | POA: Diagnosis not present

## 2018-06-13 DIAGNOSIS — H5319 Other subjective visual disturbances: Secondary | ICD-10-CM | POA: Diagnosis not present

## 2018-06-13 DIAGNOSIS — H43811 Vitreous degeneration, right eye: Secondary | ICD-10-CM | POA: Diagnosis not present

## 2018-06-20 DIAGNOSIS — L814 Other melanin hyperpigmentation: Secondary | ICD-10-CM | POA: Diagnosis not present

## 2018-06-20 DIAGNOSIS — Z85828 Personal history of other malignant neoplasm of skin: Secondary | ICD-10-CM | POA: Diagnosis not present

## 2018-06-20 DIAGNOSIS — D1801 Hemangioma of skin and subcutaneous tissue: Secondary | ICD-10-CM | POA: Diagnosis not present

## 2018-06-20 DIAGNOSIS — L218 Other seborrheic dermatitis: Secondary | ICD-10-CM | POA: Diagnosis not present

## 2018-06-20 DIAGNOSIS — L570 Actinic keratosis: Secondary | ICD-10-CM | POA: Diagnosis not present

## 2018-06-27 DIAGNOSIS — H04123 Dry eye syndrome of bilateral lacrimal glands: Secondary | ICD-10-CM | POA: Diagnosis not present

## 2018-06-27 DIAGNOSIS — H16223 Keratoconjunctivitis sicca, not specified as Sjogren's, bilateral: Secondary | ICD-10-CM | POA: Diagnosis not present

## 2018-06-27 DIAGNOSIS — H01009 Unspecified blepharitis unspecified eye, unspecified eyelid: Secondary | ICD-10-CM | POA: Diagnosis not present

## 2018-07-17 ENCOUNTER — Other Ambulatory Visit: Payer: Self-pay | Admitting: Family Medicine

## 2018-07-19 ENCOUNTER — Other Ambulatory Visit: Payer: Self-pay | Admitting: Family Medicine

## 2018-07-19 MED ORDER — CYCLOBENZAPRINE HCL 10 MG PO TABS
10.0000 mg | ORAL_TABLET | Freq: Every day | ORAL | 1 refills | Status: DC
Start: 2018-07-19 — End: 2018-11-01

## 2018-07-24 ENCOUNTER — Encounter: Payer: Self-pay | Admitting: Family Medicine

## 2018-07-25 ENCOUNTER — Other Ambulatory Visit: Payer: Self-pay | Admitting: Family Medicine

## 2018-07-25 DIAGNOSIS — M159 Polyosteoarthritis, unspecified: Secondary | ICD-10-CM

## 2018-07-25 DIAGNOSIS — F418 Other specified anxiety disorders: Secondary | ICD-10-CM

## 2018-07-28 ENCOUNTER — Other Ambulatory Visit: Payer: Self-pay | Admitting: Family Medicine

## 2018-07-28 ENCOUNTER — Other Ambulatory Visit: Payer: Self-pay | Admitting: *Deleted

## 2018-07-28 DIAGNOSIS — E049 Nontoxic goiter, unspecified: Secondary | ICD-10-CM

## 2018-07-28 DIAGNOSIS — M159 Polyosteoarthritis, unspecified: Secondary | ICD-10-CM

## 2018-07-28 DIAGNOSIS — E041 Nontoxic single thyroid nodule: Secondary | ICD-10-CM

## 2018-07-28 DIAGNOSIS — F418 Other specified anxiety disorders: Secondary | ICD-10-CM

## 2018-07-28 NOTE — Telephone Encounter (Signed)
Last refill of Levothyroxine on 06/09/18 #90. Refill requested too soon.

## 2018-07-28 NOTE — Telephone Encounter (Signed)
Copied from [place] [phone]. Topic: Quick Communication - Rx Refill/Question >> Jul 28, 2018 11:08 AM Leward Quan A wrote: Medication: DULoxetine (CYMBALTA) 60 MG capsule,  levothyroxine (SYNTHROID, LEVOTHROID) 25 MCG tablet  Has the patient contacted their pharmacy? Yes.   (Agent: If no, request that the patient contact the pharmacy for the refill.) (Agent: If yes, when and what did the pharmacy advise?)  Preferred Pharmacy (with phone number or street name): [place], [place] [phone] (Phone)   Agent: Please be advised that RX refills may take up to 3 business days. We ask that you follow-up with your pharmacy.

## 2018-07-28 NOTE — Telephone Encounter (Signed)
Copied from [place] [id number]. Topic: General - Other >> Jul 28, 2018 11:11 AM Jodie Echevaria wrote: Reason for CRM: Patient called to say that she have sent a My chart message regarding getting a referral for a Thyroid ultra sound. She would like to have one scheduled soon. Please advise Ph# [phone]

## 2018-07-28 NOTE — Telephone Encounter (Signed)
Refill requests for duloxetine last filled 07/25/18 #90, and levothyroxine 06/09/18 #90; spoke with Rosann Auerbach, Pharmacist at [facility]; she states that on 06/12/18 #90 levothyroxine, and duloxetine on 05/23/18 #90; she will cancel the faxes that were previously sent; (duloxetine due 08/23/18); (levothyroxine due 09/12/18) based on these dates refills for both medications requested too soon.

## 2018-08-04 ENCOUNTER — Ambulatory Visit
Admission: RE | Admit: 2018-08-04 | Discharge: 2018-08-04 | Disposition: A | Discharge status: Home / Self Care | Payer: Medicare Other | Source: Ambulatory Visit | Attending: Family Medicine | Admitting: Family Medicine

## 2018-08-04 DIAGNOSIS — E042 Nontoxic multinodular goiter: Secondary | ICD-10-CM | POA: Diagnosis not present

## 2018-08-04 DIAGNOSIS — E049 Nontoxic goiter, unspecified: Secondary | ICD-10-CM

## 2018-08-04 DIAGNOSIS — E041 Nontoxic single thyroid nodule: Secondary | ICD-10-CM

## 2018-08-08 ENCOUNTER — Encounter: Payer: Self-pay | Admitting: Family Medicine

## 2018-08-10 ENCOUNTER — Other Ambulatory Visit: Payer: Self-pay | Admitting: *Deleted

## 2018-08-10 DIAGNOSIS — R0600 Dyspnea, unspecified: Secondary | ICD-10-CM

## 2018-08-10 MED ORDER — ALBUTEROL SULFATE HFA 108 (90 BASE) MCG/ACT IN AERS: 2.0000 | INHALATION_SPRAY | Freq: Four times a day (QID) | RESPIRATORY_TRACT | 2 refills | Status: DC | PRN

## 2018-08-10 MED ORDER — PROPRANOLOL HCL 40 MG PO TABS: 40.0000 mg | ORAL_TABLET | Freq: Two times a day (BID) | ORAL | 2 refills | Status: DC

## 2018-08-10 MED ORDER — LOSARTAN POTASSIUM 50 MG PO TABS: 50.0000 mg | ORAL_TABLET | Freq: Every day | ORAL | 3 refills | Status: DC

## 2018-08-11 ENCOUNTER — Telehealth: Payer: Self-pay | Admitting: Family Medicine

## 2018-08-11 NOTE — Telephone Encounter (Signed)
Spoke with Marcello Moores and verified that 39 supply was okay to fill for patient.

## 2018-08-11 NOTE — Telephone Encounter (Signed)
Copied from [place] [id number]. Topic: Quick Communication - See Telephone Encounter >> Aug 11, 2018 11:19 AM Rutherford Nail, NT wrote: CRM for notification. See Telephone encounter for: 08/11/18. Marcello Moores with [facility] calling and states that they need clarification on albuterol (PROVENTIL HFA;VENTOLIN HFA) 108 (90 Base) MCG/ACT inhaler. States that they only supply in 90 day increments. Would like to know if they can get the order changed to 3 inhalers with refills to make it a 90 day supply? Please advise.  CB#: [phone] (can leave a voicemail if there is no answer) Per [place], if a voicemail is left the caller must state first and last name.

## 2018-08-21 ENCOUNTER — Ambulatory Visit: Payer: Medicare Other | Admitting: Family Medicine

## 2018-08-21 DIAGNOSIS — M5136 Other intervertebral disc degeneration, lumbar region: Secondary | ICD-10-CM | POA: Diagnosis not present

## 2018-08-21 DIAGNOSIS — M25511 Pain in right shoulder: Secondary | ICD-10-CM | POA: Diagnosis not present

## 2018-08-23 ENCOUNTER — Ambulatory Visit (INDEPENDENT_AMBULATORY_CARE_PROVIDER_SITE_OTHER): Payer: PPO | Admitting: Family Medicine

## 2018-08-23 ENCOUNTER — Other Ambulatory Visit: Payer: Self-pay | Admitting: *Deleted

## 2018-08-23 ENCOUNTER — Encounter: Payer: Self-pay | Admitting: Family Medicine

## 2018-08-23 VITALS — BP 124/78 | HR 71 | Temp 98.3°F | Resp 12 | Ht 62.0 in | Wt 181.1 lb

## 2018-08-23 DIAGNOSIS — E041 Nontoxic single thyroid nodule: Secondary | ICD-10-CM

## 2018-08-23 DIAGNOSIS — N951 Menopausal and female climacteric states: Secondary | ICD-10-CM | POA: Diagnosis not present

## 2018-08-23 DIAGNOSIS — K219 Gastro-esophageal reflux disease without esophagitis: Secondary | ICD-10-CM

## 2018-08-23 DIAGNOSIS — Z6831 Body mass index (BMI) 31.0-31.9, adult: Secondary | ICD-10-CM

## 2018-08-23 DIAGNOSIS — E6609 Other obesity due to excess calories: Secondary | ICD-10-CM | POA: Diagnosis not present

## 2018-08-23 DIAGNOSIS — F418 Other specified anxiety disorders: Secondary | ICD-10-CM | POA: Diagnosis not present

## 2018-08-23 DIAGNOSIS — M159 Polyosteoarthritis, unspecified: Secondary | ICD-10-CM | POA: Diagnosis not present

## 2018-08-23 MED ORDER — PANTOPRAZOLE SODIUM 40 MG PO TBEC: 40.0000 mg | DELAYED_RELEASE_TABLET | Freq: Every day | ORAL | 3 refills | Status: DC

## 2018-08-23 NOTE — Assessment & Plan Note (Signed)
Stable. She does not feel like medication needs to be adjusted at this time.  So she will continue Cymbalta and alprazolam.  Follow-up in 3 to 4 months, before if needed.

## 2018-08-23 NOTE — Assessment & Plan Note (Signed)
Symptomatic. She will stop omeprazole and try Protonix. We discussed some side effects of PPIs. GERD precautions to continue. Follow-up in 3 to 4 months.

## 2018-08-23 NOTE — Patient Instructions (Addendum)
A few things to remember from today's visit:   Gastroesophageal reflux disease, esophagitis presence not specified  Other specified anxiety disorders  Class 1 obesity due to excess calories with body mass index (BMI) of 31.0 to 31.9 in adult, unspecified whether serious comorbidity present  Generalized osteoarthritis of multiple sites  Thyroid nodule  Menopausal hot flushes  Stop omeprazole and try Protonix. Avoid foods that exacerbate your acid reflux. Ask your gynecologist about paroxetine for hot flashes. No changes in Cymbalta since it is helping with joint pain. No changes in alprazolam.  Thyroid biopsy is not recommended at this time.  Try intermittent fasting twice per week as we discussed. *Aquatic exercises will help tremendously.   Please be sure medication list is accurate. If a new problem present, please set up appointment sooner than planned today.

## 2018-08-23 NOTE — Progress Notes (Signed)
HPI:   [name] is a 68 y.o. female, who is here today for 3-4 months follow up.   She was last seen on 05/19/18.  Anxiety, currently she is on Cymbalta 60 mg daily and Alprazolam 0.25 mg daily at bedtime as needed. Denies depressed mood or suicidal thoughts. In general she feels like it is "ok",it is "up and down."  Cymbalta is helping with generalized joint pain, OA. She has tolerated medication well.  C/O "a lot of gas" and burping frequently. + Heartburn. Wakes up sometimes with acid reflux.  GERD on Omeprazole 40 mg daily. She tries to avoid foods that aggravate symptoms, pizza and bacon. Denies changes in bowel habits, has had diarrhea for "a while",years. First stool in the morning is "semi formed", rest are watery stools.  Problem is daily.  She has not identified exacerbating or alleviating factors. She has not noted blood or mucus. She does not feel like Cymbalta aggravated problem.  Bloating sensation.     Concerned about thyroid. She wonders if she needs another Bx. She had thyroid US on 08/04/18: 1. Normal-sized thyroid with stable bilateral nodules. None meets criteria for biopsy. 2. Recommend annual/biennial ultrasound follow-up as above, until stability x5 years confirmed.  She is on Levothyroxine 25 mcg daily.  Frustrated because she is not able to lose weight. She is planning on starting swimming. She is now doing silver snickers. She is also trying to follow a healthier diet.    Hot flashes affecting sleep. She resumed hormonal therapy but it has not help.  She has an appointment with a gynecologist in 09/2018. Currently she is on estradiol patch.    Review of Systems  Constitutional: Positive for fatigue. Negative for activity change, appetite change and fever.  HENT: Negative for mouth sores, nosebleeds and trouble swallowing.   Respiratory: Negative for cough, shortness of breath and wheezing.   Cardiovascular: Negative  for chest pain and palpitations.  Gastrointestinal: Positive for diarrhea. Negative for abdominal pain, nausea and vomiting.       Negative for changes in bowel habits.  Endocrine: Positive for heat intolerance. Negative for cold intolerance.  Genitourinary: Negative for decreased urine volume and hematuria.  Musculoskeletal: Positive for arthralgias. Negative for gait problem and joint swelling.  Neurological: Negative for syncope, weakness and headaches.  Psychiatric/Behavioral: Positive for sleep disturbance. The patient is nervous/anxious.      Current Outpatient Medications on File Prior to Visit  Medication Sig Dispense Refill  . albuterol (PROVENTIL HFA;VENTOLIN HFA) 108 (90 Base) MCG/ACT inhaler Inhale 2 puffs into the lungs every 6 (six) hours as needed for wheezing or shortness of breath. 1 Inhaler 2  . ALPRAZolam (XANAX) 0.25 MG tablet Take 1 tablet (0.25 mg total) by mouth at bedtime as needed for anxiety or sleep. 30 tablet 3  . Azelaic Acid (FINACEA) 15 % cream Apply 1 application topically 2 (two) times daily. After skin is thoroughly washed and patted dry, gently but thoroughly massage a thin film of azelaic acid cream into the affected area twice daily, in the morning and evening.    . cyclobenzaprine (FLEXERIL) 10 MG tablet Take 1 tablet (10 mg total) by mouth daily. 90 tablet 1  . diclofenac sodium (VOLTAREN) 1 % GEL Apply 3 gm to 3 large joints up to 3 times a day.Dispense 3 tubes with 3 refills. 3 Tube 1  . DULoxetine (CYMBALTA) 60 MG capsule TAKE 1 CAPSULE EVERY DAY 90 capsule 2  . estradiol (VIVELLE-DOT)  0.0375 MG/24HR APPLY 1 PATCH TOPICALLY TWICE A WEEK  8  . levothyroxine (SYNTHROID, LEVOTHROID) 25 MCG tablet TAKE 1 TABLET (25 MCG TOTAL) BY MOUTH DAILY BEFORE BREAKFAST. 90 tablet 2  . losartan (COZAAR) 50 MG tablet Take 1 tablet (50 mg total) by mouth daily. 90 tablet 3  . Magnesium 250 MG TABS Take by mouth daily.    . meclizine (ANTIVERT) 12.5 MG tablet Take 1  tablet (12.5 mg total) by mouth 3 (three) times daily as needed for dizziness. 60 tablet 1  . Multiple Vitamin (MULTIVITAMIN) tablet Take 1 tablet by mouth daily.    . propranolol (INDERAL) 40 MG tablet Take 1 tablet (40 mg total) by mouth 2 (two) times daily. 180 tablet 2  . vitamin B-12 (CYANOCOBALAMIN) 1000 MCG tablet Take 1,000 mcg by mouth once a week.     No current facility-administered medications on file prior to visit.      Past Medical History:  Diagnosis Date  . Anxiety   . Arthritis   . Cancer ([place])    basal and squamous cell skin  . Chicken pox   . COPD (chronic obstructive pulmonary disease) ([place])   . Depression   . GERD (gastroesophageal reflux disease)   . Migraines   . Thyroid disease    Allergies  Allergen Reactions  . Meloxicam Other (See Comments)    "skin infection on leg"  . Hydrocodone Nausea Only    Per patient   . Sulfa Antibiotics Rash    Rash all over body/fever    Social History   Socioeconomic History  . Marital status: Single    Spouse name: Not on file  . Number of children: Not on file  . Years of education: Not on file  . Highest education level: Not on file  Occupational History  . Occupation: retired    Comment: administration  Social Needs  . Financial resource strain: Not on file  . Food insecurity:    Worry: Not on file    Inability: Not on file  . Transportation needs:    Medical: Not on file    Non-medical: Not on file  Tobacco Use  . Smoking status: Former Smoker    Packs/day: 1.50    Years: 25.00    Pack years: 37.50    Last attempt to quit: 11/02/1990    Years since quitting: 27.8  . Smokeless tobacco: Never Used  Substance and Sexual Activity  . Alcohol use: Yes    Comment: glass of wine once a month  . Drug use: No  . Sexual activity: Never  Lifestyle  . Physical activity:    Days per week: Not on file    Minutes per session: Not on file  . Stress: Not on file  Relationships  . Social connections:     Talks on phone: Not on file    Gets together: Not on file    Attends religious service: Not on file    Active member of club or organization: Not on file    Attends meetings of clubs or organizations: Not on file    Relationship status: Not on file  Other Topics Concern  . Not on file  Social History Narrative  . Not on file    Vitals:   08/23/18 0949  BP: 124/78  Pulse: 71  Resp: 12  Temp: 98.3 F (36.8 C)  SpO2: 98%   Body mass index is 33.13 kg/m.   Wt Readings from Last 3 Encounters:  08/23/18 181 lb 2 oz (82.2 kg)  05/19/18 179 lb 6 oz (81.4 kg)  01/18/18 169 lb (76.7 kg)    Physical Exam  Nursing note and vitals reviewed. Constitutional: She is oriented to person, place, and time. She appears well-developed. No distress.  HENT:  Head: Normocephalic and atraumatic.  Mouth/Throat: Oropharynx is clear and moist and mucous membranes are normal.  Eyes: Pupils are equal, round, and reactive to light. Conjunctivae are normal.  Cardiovascular: Normal rate and regular rhythm.  No murmur heard. Pulses:      Dorsalis pedis pulses are 2+ on the right side and 2+ on the left side.  Respiratory: Effort normal and breath sounds normal. No respiratory distress.  GI: Soft. She exhibits no mass. There is no hepatomegaly. There is no abdominal tenderness.  Musculoskeletal:        General: No edema.  Lymphadenopathy:    She has no cervical adenopathy.  Neurological: She is alert and oriented to person, place, and time. She has normal strength. No cranial nerve deficit. Gait normal.  Skin: Skin is warm. No rash noted. No erythema.  Psychiatric: Her mood appears anxious.  Well groomed, good eye contact.       ASSESSMENT AND PLAN:   Kristina [name] was seen today for 3-4 months follow-up.  No orders of the defined types were placed in this encounter.   Class 1 obesity with body mass index (BMI) of 31.0 to 31.9 in adult We discussed benefits of wt loss as well as  adverse effects of obesity. Consistency with healthy diet and physical activity recommended. We discussed dietary options, she is interested in intermittent fasting.   Anxiety disorder Stable. She does not feel like medication needs to be adjusted at this time.  So she will continue Cymbalta and alprazolam.  Follow-up in 3 to 4 months, before if needed.  GERD (gastroesophageal reflux disease) Symptomatic. She will stop omeprazole and try Protonix. We discussed some side effects of PPIs. GERD precautions to continue. Follow-up in 3 to 4 months.  Generalized osteoarthritis of multiple sites Cymbalta is helping with arthralgias, so no changes for now. Low impact exercise, aquatic exercises will help.   Thyroid nodule We reviewed [place] report and recommendations.Nodules do not meet criteria for Bx. Reassured for now. We will plan on repeating thyroid US in 1-2 years.  Menopausal hot flushes We discussed other pharmacologic treatments. Because she is already on Cymbalta, Effexor if not an option. Paroxetine is an option, she prefers to hold on more meds for now. Keep appt with gyn    Return in about 4 months (around 12/22/2018) for HTN,anx,GERD.      Comfort Iversen G. [place], MD  [facility]. [place] office.

## 2018-08-23 NOTE — Assessment & Plan Note (Signed)
Cymbalta is helping with arthralgias, so no changes for now. Low impact exercise, aquatic exercises will help.

## 2018-08-23 NOTE — Assessment & Plan Note (Signed)
We discussed benefits of wt loss as well as adverse effects of obesity. Consistency with healthy diet and physical activity recommended. We discussed dietary options, she is interested in intermittent fasting.

## 2018-08-24 ENCOUNTER — Encounter: Payer: Self-pay | Admitting: Family Medicine

## 2018-08-25 ENCOUNTER — Other Ambulatory Visit: Payer: Self-pay | Admitting: *Deleted

## 2018-08-25 ENCOUNTER — Telehealth: Payer: Self-pay | Admitting: Family Medicine

## 2018-08-25 DIAGNOSIS — K219 Gastro-esophageal reflux disease without esophagitis: Secondary | ICD-10-CM

## 2018-08-25 MED ORDER — PANTOPRAZOLE SODIUM 40 MG PO TBEC: 40.0000 mg | DELAYED_RELEASE_TABLET | Freq: Every day | ORAL | 3 refills | Status: DC

## 2018-08-25 NOTE — Telephone Encounter (Signed)
Copied from [place] [id number]. Topic: Quick Communication - See Telephone Encounter >> Aug 25, 2018 12:27 PM Rutherford Nail, NT wrote: CRM for notification. See Telephone encounter for: 08/25/18. Arbie Cookey with [facility] calling and states that they received a prescription for pantoprazole (PROTONIX) 40 MG tablet. States that they fill for 90 days and are wanting to know if they could get approval to prescribe 90 day supply and adjust refills? Please advise.  CB#: [phone] (secure voicemail)

## 2018-08-25 NOTE — Telephone Encounter (Signed)
New Rx sent in for 90 day supply. Left vm for Arbie Cookey at [facility] that 90 day supply was okay to fill.

## 2018-09-01 ENCOUNTER — Encounter: Payer: Self-pay | Admitting: Family Medicine

## 2018-09-08 ENCOUNTER — Other Ambulatory Visit: Payer: Self-pay | Admitting: Family Medicine

## 2018-09-08 NOTE — Telephone Encounter (Signed)
Requested medication (s) are due for refill today: yes  Requested medication (s) are on the active medication list: yes    Last refill:     10/07/17     3 tubes    1 refill  Future visit scheduled 12/22/2018  Dr. Martinique   Notes to clinic:Historical provider: **Please note, Pharmacy change would not route: Preferred Kristina [name], Kristina [name]...NOT mail order  Requested Prescriptions  Pending Prescriptions Disp Refills   diclofenac sodium (VOLTAREN) 1 % GEL 3 Tube 1    Sig: Apply 3 gm to 3 large joints up to 3 times a day.Dispense 3 tubes with 3 refills.     Analgesics:  Topicals Passed - 09/08/2018 10:36 AM      Passed - Valid encounter within last 12 months    Recent Outpatient Visits          2 weeks ago Gastroesophageal reflux disease, esophagitis presence not specified   Therapist[facility] at [facility] [place], Malka So, MD   3 months ago Essential hypertension   Therap[facility] at [facility] [place], Malka So, MD   8 months ago Essential hypertension   Therapist[facility] [place], Malka So, MD   1 year ago Benign essential tremor   Therapist[facility] at [facility] [place], Malka So, MD   1 year ago Exertional dyspnea   Therapist[facility] at [facility] [place], Malka So, MD      Future Appointments            In 3 months  [facility] at [place], [place]   In 3 months [place], Malka So, MD [employer] at [name], [place]

## 2018-09-08 NOTE — Telephone Encounter (Signed)
Copied from [place] [id number]. Topic: Quick Communication - Rx Refill/Question >> Sep 08, 2018 10:28 AM Selinda Flavin B, NT wrote: **Would like a 90 day supply (3 tubes) sent. No longer seeing Dr Estanislado Pandy.**  Medication: diclofenac sodium (VOLTAREN) 1 % GEL  Has the patient contacted their pharmacy? Yes.   (Agent: If no, request that the patient contact the pharmacy for the refill.) (Agent: If yes, when and what did the pharmacy advise?)  Preferred Pharmacy (with phone number or street name): [facility], [place]  Agent: Please be advised that RX refills may take up to 3 business days. We ask that you follow-up with your pharmacy.

## 2018-09-09 ENCOUNTER — Other Ambulatory Visit: Payer: Self-pay | Admitting: Family Medicine

## 2018-09-09 DIAGNOSIS — G4700 Insomnia, unspecified: Secondary | ICD-10-CM

## 2018-09-09 MED ORDER — DICLOFENAC SODIUM 1 % TD GEL: TRANSDERMAL | 3 refills | Status: DC

## 2018-09-11 IMAGING — DX DG CHEST 2V
2 series · 2 of 2 positions shown · non-contrast
Comparison: None in PACs

CLINICAL DATA: Intermittent nonproductive cough for the past 4
months mostly at night. Nonsmoker. History of gastroesophageal
reflux, former smoker.

EXAM:
CHEST  2 VIEW

[chest pa]
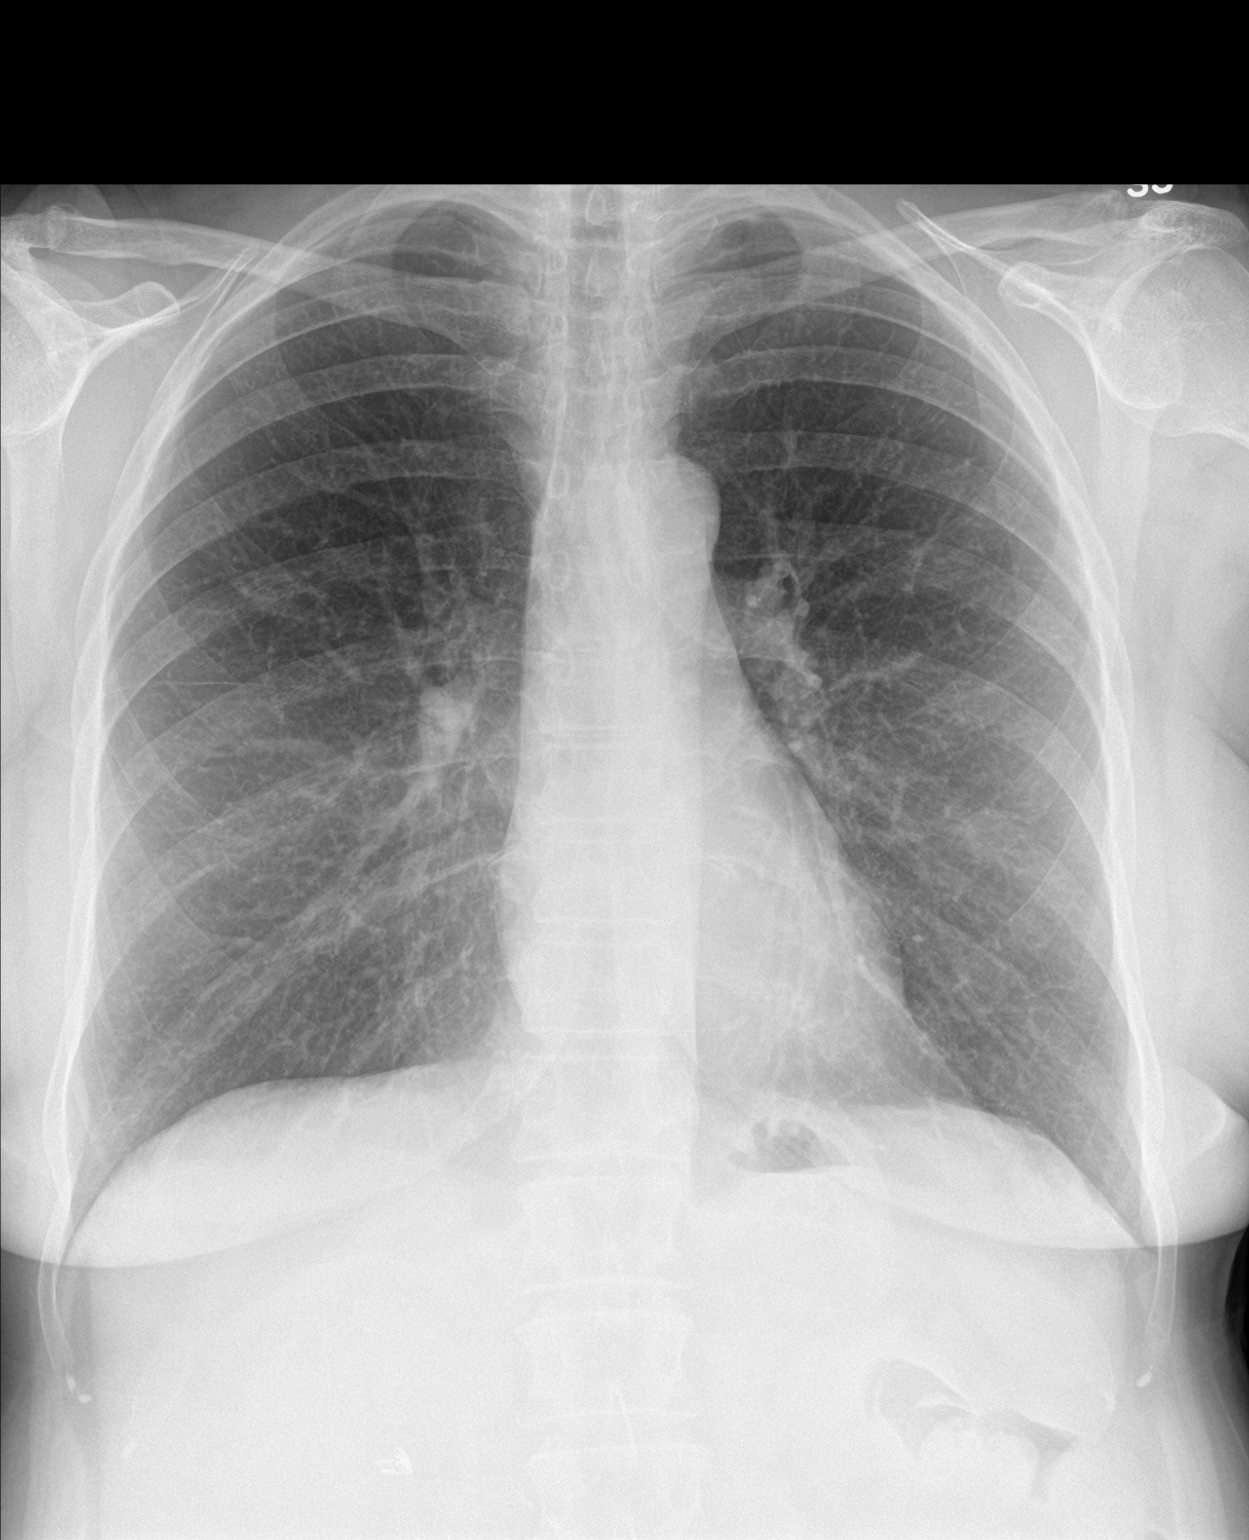

[chest lat]
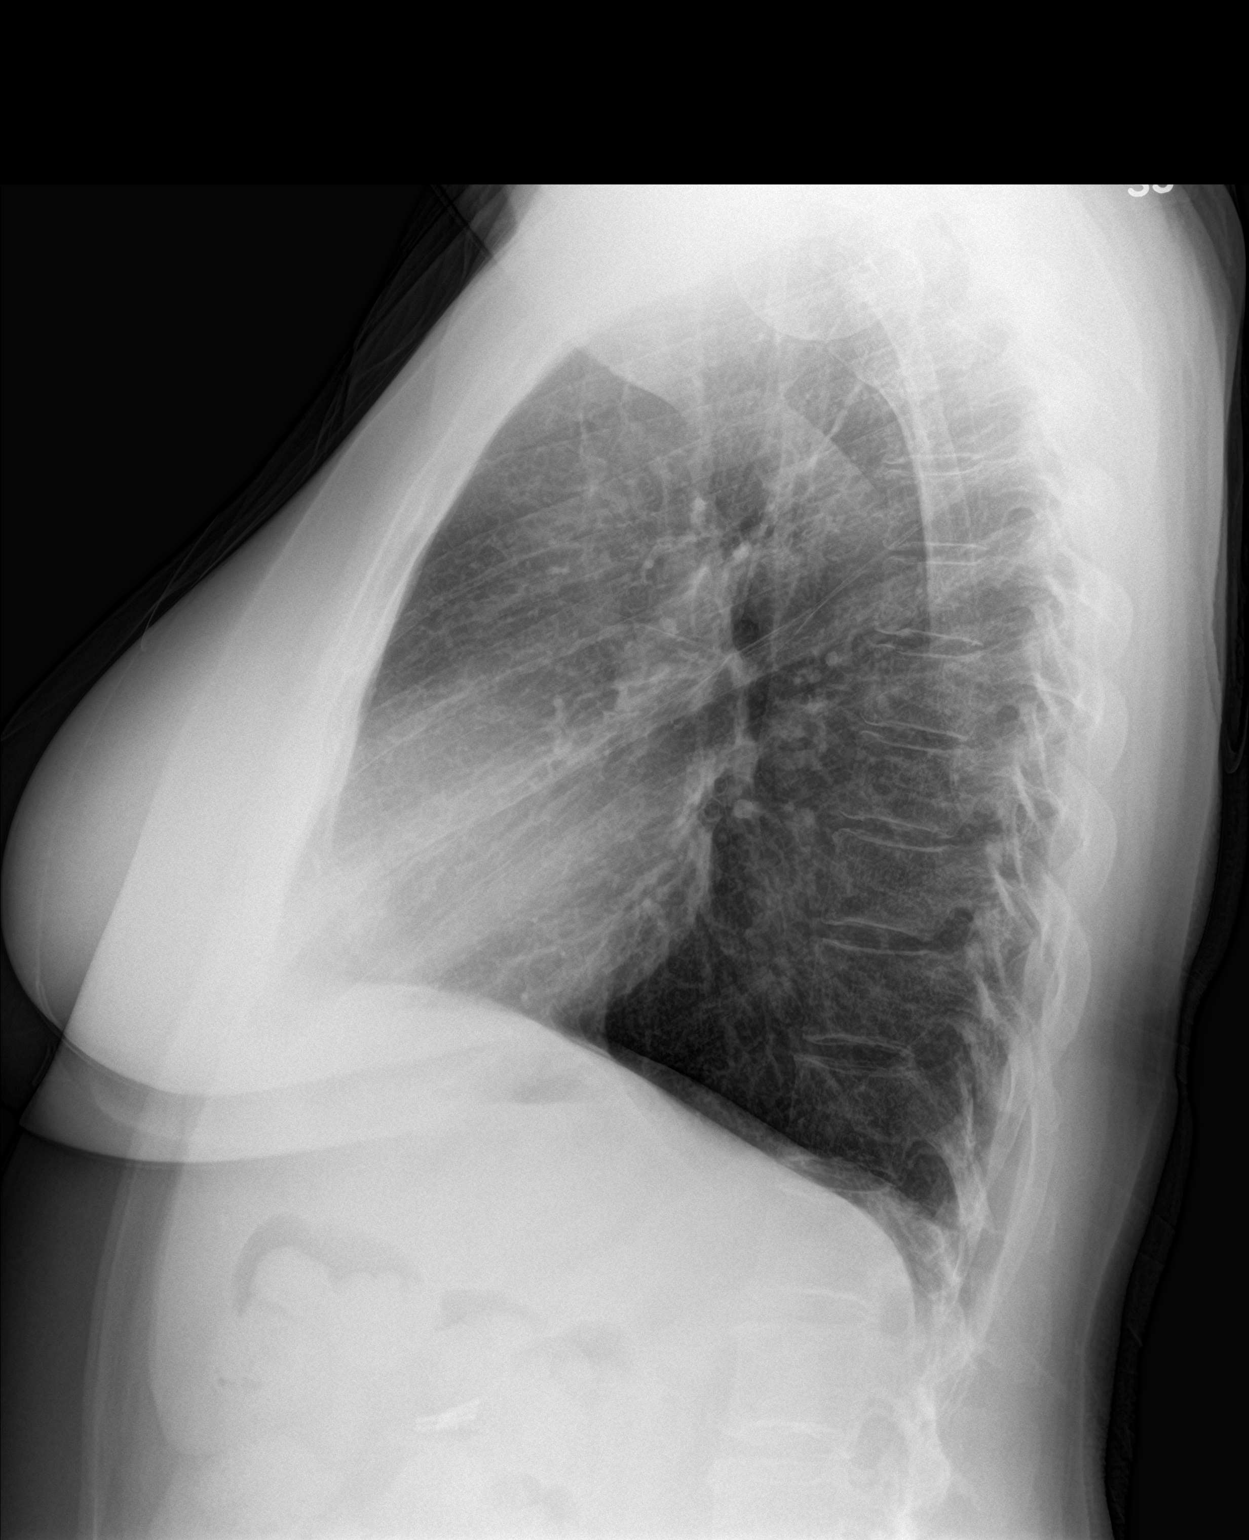

[2 of 2 positions shown; findings below may reference images not displayed]

FINDINGS: The lungs are hyperinflated with mild hemidiaphragm flattening.
There is no focal infiltrate. There is no pleural effusion. No
pulmonary parenchymal nodules or masses are observed. The heart and
pulmonary vascularity are normal. The mediastinum is normal in
width. There is calcification in the wall of the aortic arch. The
bony thorax exhibits no acute abnormality.
IMPRESSION: Mild hyperinflation and interstitial prominence consistent with the
smoking history and chronic bronchitis. No alveolar pneumonia nor
CHF.

Thoracic aortic atherosclerosis.

## 2018-09-22 DIAGNOSIS — N951 Menopausal and female climacteric states: Secondary | ICD-10-CM | POA: Diagnosis not present

## 2018-11-01 ENCOUNTER — Other Ambulatory Visit: Payer: Self-pay | Admitting: *Deleted

## 2018-11-01 MED ORDER — CYCLOBENZAPRINE HCL 10 MG PO TABS: 10.0000 mg | ORAL_TABLET | Freq: Every day | ORAL | 1 refills | Status: DC

## 2018-11-15 ENCOUNTER — Telehealth: Payer: Self-pay | Admitting: *Deleted

## 2018-11-15 NOTE — Telephone Encounter (Signed)
[facility] faxed a note stating the request was denied and this was given to Dr Doug Sou asst.

## 2018-11-15 NOTE — Telephone Encounter (Signed)
Prior auth for Cyclobenzaprine 10mg  sent to [email or web address]-key [id number].

## 2018-11-15 NOTE — Telephone Encounter (Signed)
FYI sent to Dr. Jordan 

## 2018-11-21 NOTE — Telephone Encounter (Signed)
Please convey this information to pt. Options: Zanaflex 4 mg daily at bedtime as needed.  Thanks, Kristina [name]

## 2018-11-21 NOTE — Telephone Encounter (Signed)
Patient informed and stated that she would get medication when needed at a later time because she paid for the Cyclobenzaprine through [employer] for $21. Nothing further needed at this time.

## 2018-12-19 DIAGNOSIS — H43813 Vitreous degeneration, bilateral: Secondary | ICD-10-CM | POA: Diagnosis not present

## 2018-12-19 DIAGNOSIS — H35013 Changes in retinal vascular appearance, bilateral: Secondary | ICD-10-CM | POA: Diagnosis not present

## 2018-12-19 DIAGNOSIS — H16223 Keratoconjunctivitis sicca, not specified as Sjogren's, bilateral: Secondary | ICD-10-CM | POA: Diagnosis not present

## 2018-12-19 DIAGNOSIS — Z961 Presence of intraocular lens: Secondary | ICD-10-CM | POA: Diagnosis not present

## 2018-12-19 DIAGNOSIS — H524 Presbyopia: Secondary | ICD-10-CM | POA: Diagnosis not present

## 2018-12-20 ENCOUNTER — Encounter: Payer: Self-pay | Admitting: Family Medicine

## 2018-12-20 ENCOUNTER — Ambulatory Visit (INDEPENDENT_AMBULATORY_CARE_PROVIDER_SITE_OTHER): Payer: PPO | Admitting: Family Medicine

## 2018-12-20 ENCOUNTER — Ambulatory Visit: Payer: Medicare Other

## 2018-12-20 ENCOUNTER — Other Ambulatory Visit: Payer: Self-pay

## 2018-12-20 VITALS — BP 127/78 | HR 76 | Resp 12

## 2018-12-20 DIAGNOSIS — M159 Polyosteoarthritis, unspecified: Secondary | ICD-10-CM

## 2018-12-20 DIAGNOSIS — F418 Other specified anxiety disorders: Secondary | ICD-10-CM

## 2018-12-20 DIAGNOSIS — E785 Hyperlipidemia, unspecified: Secondary | ICD-10-CM

## 2018-12-20 DIAGNOSIS — K219 Gastro-esophageal reflux disease without esophagitis: Secondary | ICD-10-CM | POA: Diagnosis not present

## 2018-12-20 DIAGNOSIS — I10 Essential (primary) hypertension: Secondary | ICD-10-CM

## 2018-12-20 DIAGNOSIS — E039 Hypothyroidism, unspecified: Secondary | ICD-10-CM

## 2018-12-20 DIAGNOSIS — Z0000 Encounter for general adult medical examination without abnormal findings: Secondary | ICD-10-CM | POA: Diagnosis not present

## 2018-12-20 MED ORDER — METHOCARBAMOL 500 MG PO TABS: 500.0000 mg | ORAL_TABLET | Freq: Two times a day (BID) | ORAL | 0 refills | Status: DC | PRN

## 2018-12-20 NOTE — Progress Notes (Signed)
Virtual Visit via Video Note   I connected with Kristina [name] on 12/20/18 at 10:00 AM EDT by a video enabled telemedicine application and verified that I am speaking with the correct person using two identifiers.  Location patient: home Location provider:home office Persons participating in the virtual visit: patient, provider  I discussed the limitations of evaluation and management by telemedicine and the availability of in person appointments. The patient expressed understanding and agreed to proceed.  Chief Complaint  Patient presents with  . Medicare Wellness  . Follow-up    HPI: Kristina [name] is a 68 yo female with Hx of anxiety,depression,chronic pain,OA,HTN,GERD,and hypothyroidism among some;following on chronic medical problems and Medicare preventive visit.  Last Medicare visit 12/16/17 She lives alone. She has friends close by she visits regularly,they are like family. She enjoys walking and playing cards.  Independent ADL's and IADL's. No falls in the past year and denies active depression symptoms.  Functional Status Survey: Is the patient deaf or have difficulty hearing?: No Does the patient have difficulty seeing, even when wearing glasses/contacts?: No Does the patient have difficulty concentrating, remembering, or making decisions?: No Does the patient have difficulty walking or climbing stairs?: Yes(Knee OA, difficulty going walking stairs up.) Does the patient have difficulty dressing or bathing?: No Does the patient have difficulty doing errands alone such as visiting a doctor's office or shopping?: No  Fall Risk  12/20/2018 12/16/2017 11/01/2016  Falls in the past year? 0 Yes No  Comment - playing with a dog -  Number falls in past yr: 0 1 -  Injury with Fall? 0 Yes -  Comment - broke her knuckle at ring finger on left hand  -  Risk for fall due to : Orthopedic patient - -  Follow up Education provided Education provided -    Colonoscopy on 03/09/2017, 10-year  follow-up was recommended. Mammogram in 09/2017, BI-RADS 1. DEXA, per patient report done at the same time that her mammogram, showed osteopenia. She is currently on calcium and vitamin D supplementation. She has not had new shingles vaccine, Shingrix.  Providers she sees regularly:  Eye care provider: Dr Cammy Brochure visit was yesterday. Gyn,Dr Ozan,next visit 09/2018. Ortho prn.    Depression screen [facility] 2/9 12/20/2018  Decreased Interest 0  Down, Depressed, Hopeless 0  PHQ - 2 Score 0   Mini-Cog - 12/20/18 1020    Normal clock drawing test?  yes    How many words correct?  3      Vision Screening Comments: Virtual visit. Had eye exam yesterday,12/19/18.  Chronic disease management.  She has a f/u appt later this week,she would like to do it today instead.   Generalized OA and back pain: Joint pain is "all over" but mainly shoulder, IP, hips, and knees. She has not noted edema or erythema. She was going today [employer], swimming a few times per week, and this was helping.Since [employer] closed she has been walking more than usual.  Back pain, knee pain, and hip pain getting worse, exacerbated by prolonged walking. Pain is constant,"bad,bad pain." She has followed with ortho for back and knee pain,she is thinking about arranging an appt.  She takes Flexeril 10 mg twice daily as needed, her insurance is not longer covering medication. Flexeril helps with lower back pain, no major side effects.  Cymbalta 60 mg daily has helped some. She has followed with rheumatology, ruled out RA.  Hypertension, currently she is on propranolol 40 mg twice daily, which also helps  with essential tremor, and losartan 50 mg daily. She checks BP regularly, usually 120s/70s. She denies unusual headache, visual changes, chest pain, dyspnea, palpitations, edema, or focal deficit. CKD 3, she has not noted foamy urine, gross hematuria, or decrease in urine output.  Lab Results  Component Value Date    CREATININE 1.02 05/19/2018   BUN 13 05/19/2018   NA 139 05/19/2018   K 4.4 05/19/2018   CL 104 05/19/2018   CO2 30 05/19/2018   Hypothyroidism:  Currently she is on Levothyroxine 25 mcg daily.. Tolerating medication well, no side effects reported. She has not noted changes in bowel habits, worsening tremor, cold/heat intolerance, or abnormal weight loss.  Lab Results  Component Value Date   TSH 1.86 05/19/2018    HLD: Currently she is on non pharmacologic treatment. She has not been consistent with following a low fat diet.  Lab Results  Component Value Date   CHOL 200 (H) 08/04/2017   HDL 48 08/04/2017   LDLCALC 132 (H) 08/04/2017   TRIG 102 08/04/2017   CHOLHDL 4.2 08/04/2017    Anxiety, currently she is on Xanax 0.25 mg daily as needed, medication also helps her with sleep. Cymbalta 60 mg daily. Anxiety has been mildly worse due to COVID 19 pandemia. She denies depressed mood or suicidal thoughts.  GERD: She takes Protonix 40 mg daily. He helps with burping, acid reflux, and heartburn as far as she takes a daily. Negative for abdominal pain, nausea, vomiting, melena, or blood in the stool.  ROS: See pertinent positives and negatives per HPI.  Past Medical History:  Diagnosis Date  . Anxiety   . Arthritis   . Cancer ([place])    basal and squamous cell skin  . Chicken pox   . COPD (chronic obstructive pulmonary disease) ([place])   . Depression   . GERD (gastroesophageal reflux disease)   . Migraines   . Thyroid disease     Past Surgical History:  Procedure Laterality Date  . ABDOMINAL HYSTERECTOMY  1978  . CATARACT EXTRACTION Left 12/20/2017   will have the right one completed a month later   . CHOLECYSTECTOMY  2000  . COLONOSCOPY  2007  . JOINT REPLACEMENT Right 08/19/2017   great toe   . UPPER GASTROINTESTINAL ENDOSCOPY  2725,3664    Family History  Problem Relation Age of Onset  . Arthritis Mother   . Diabetes Mother   . COPD Mother   . Heart  failure Mother   . Stroke Father   . Hypertension Father   . Hyperlipidemia Father   . Diabetes Father   . CAD Father   . Breast cancer Maternal Aunt   . Cancer Sister        bladder cancer   . Throat cancer Brother   . Colon cancer Neg Hx   . Stomach cancer Neg Hx   . Esophageal cancer Neg Hx     Social History   Socioeconomic History  . Marital status: Single    Spouse name: Not on file  . Number of children: Not on file  . Years of education: Not on file  . Highest education level: Not on file  Occupational History  . Occupation: retired    Comment: administration  Social Needs  . Financial resource strain: Not on file  . Food insecurity:    Worry: Not on file    Inability: Not on file  . Transportation needs:    Medical: Not on file    Non-medical:  Not on file  Tobacco Use  . Smoking status: Former Smoker    Packs/day: 1.50    Years: 25.00    Pack years: 37.50    Last attempt to quit: 11/02/1990    Years since quitting: 28.1  . Smokeless tobacco: Never Used  Substance and Sexual Activity  . Alcohol use: Yes    Comment: glass of wine once a month  . Drug use: No  . Sexual activity: Never  Lifestyle  . Physical activity:    Days per week: Not on file    Minutes per session: Not on file  . Stress: Not on file  Relationships  . Social connections:    Talks on phone: Not on file    Gets together: Not on file    Attends religious service: Not on file    Active member of club or organization: Not on file    Attends meetings of clubs or organizations: Not on file    Relationship status: Not on file  . Intimate partner violence:    Fear of current or ex partner: Not on file    Emotionally abused: Not on file    Physically abused: Not on file    Forced sexual activity: Not on file  Other Topics Concern  . Not on file  Social History Narrative  . Not on file     Current Outpatient Medications:  .  albuterol (PROVENTIL HFA;VENTOLIN HFA) 108 (90 Base)  MCG/ACT inhaler, Inhale 2 puffs into the lungs every 6 (six) hours as needed for wheezing or shortness of breath., Disp: 1 Inhaler, Rfl: 2 .  ALPRAZolam (XANAX) 0.25 MG tablet, TAKE 1 TABLET BY MOUTH AT BEDTIME AS NEEDED FOR ANXIETY AND FOR SLEEP, Disp: 30 tablet, Rfl: 3 .  Azelaic Acid (FINACEA) 15 % cream, Apply 1 application topically 2 (two) times daily. After skin is thoroughly washed and patted dry, gently but thoroughly massage a thin film of azelaic acid cream into the affected area twice daily, in the morning and evening., Disp: , Rfl:  .  cyclobenzaprine (FLEXERIL) 10 MG tablet, Take 1 tablet (10 mg total) by mouth daily., Disp: 90 tablet, Rfl: 1 .  diclofenac sodium (VOLTAREN) 1 % GEL, Apply 3 gm to 3 large joints up to 3 times a day.Dispense 3 tubes with 3 refills., Disp: 4 Tube, Rfl: 3 .  DULoxetine (CYMBALTA) 60 MG capsule, TAKE 1 CAPSULE EVERY DAY, Disp: 90 capsule, Rfl: 2 .  estradiol (VIVELLE-DOT) 0.0375 MG/24HR, APPLY 1 PATCH TOPICALLY TWICE A WEEK, Disp: , Rfl: 8 .  levothyroxine (SYNTHROID, LEVOTHROID) 25 MCG tablet, TAKE 1 TABLET (25 MCG TOTAL) BY MOUTH DAILY BEFORE BREAKFAST., Disp: 90 tablet, Rfl: 2 .  losartan (COZAAR) 50 MG tablet, Take 1 tablet (50 mg total) by mouth daily., Disp: 90 tablet, Rfl: 3 .  Magnesium 250 MG TABS, Take by mouth daily., Disp: , Rfl:  .  meclizine (ANTIVERT) 12.5 MG tablet, Take 1 tablet (12.5 mg total) by mouth 3 (three) times daily as needed for dizziness., Disp: 60 tablet, Rfl: 1 .  methocarbamol (ROBAXIN) 500 MG tablet, Take 1 tablet (500 mg total) by mouth every 12 (twelve) hours as needed for muscle spasms., Disp: 60 tablet, Rfl: 0 .  Multiple Vitamin (MULTIVITAMIN) tablet, Take 1 tablet by mouth daily., Disp: , Rfl:  .  pantoprazole (PROTONIX) 40 MG tablet, Take 1 tablet (40 mg total) by mouth daily., Disp: 90 tablet, Rfl: 3 .  propranolol (INDERAL) 40 MG tablet, Take 1  tablet (40 mg total) by mouth 2 (two) times daily., Disp: 180 tablet, Rfl:  2 .  vitamin B-12 (CYANOCOBALAMIN) 1000 MCG tablet, Take 1,000 mcg by mouth once a week., Disp: , Rfl:   EXAM:  VITALS per patient if applicable:BP 196/22   Pulse 76   Resp 12   LMP  (LMP Unknown)   GENERAL: alert, oriented, appears well and in no acute distress  HEENT: atraumatic, conjunctiva clear, no obvious facial abnormalities on inspection.  NECK: normal movements of the head and neck  LUNGS: on inspection no signs of respiratory distress, breathing rate appears normal, no obvious gross SOB, gasping or wheezing  CV: no obvious cyanosis  Kristina: moves all visible extremities without noticeable abnormality. No signs of synovitis.  PSYCH/NEURO: pleasant and cooperative, no obvious depression.She is anxious, speech and thought processing grossly intact  ASSESSMENT AND PLAN:  Discussed the following assessment and plan:  Medicare annual wellness visit, subsequent  Essential hypertension  Gastroesophageal reflux disease, esophagitis presence not specified  Other specified anxiety disorders  Generalized osteoarthritis of multiple sites  Acquired hypothyroidism  Hyperlipidemia, unspecified hyperlipidemia type  Essential hypertension Adequately controlled. Continue Propranolol 40 mg bid and Losartan 50 mg daily. Eye exam is current. Continue low salt diet. F/U in 5-6 months, before if needed.   Anxiety disorder Mildly worse but she does not think meds need to be added. Continue Cymbalta 60 mg daily and Xanax 0.25 mg daily as needed. Some side effects discussed. F/U in 5-6 months,before if needed.  GERD (gastroesophageal reflux disease) Problem is better controlled with Protonix 40 mg,so no changes. GERD precautions also recommended.  Hypothyroidism Last TSH in normal range. No changes in current management. We will check TSH next OV.  Hyperlipidemia Low fat diet recommend for now. Will plan on checking FLP next OV.     I discussed the assessment and  treatment plan with the patient. She was provided an opportunity to ask questions and all were answered. The patient agreed with the plan and demonstrated an understanding of the instructions.     Return in about 5 months (around 05/22/2019) for f/u.    [name], MD

## 2018-12-20 NOTE — Assessment & Plan Note (Signed)
Low fat diet recommend for now. Will plan on checking FLP next OV.

## 2018-12-20 NOTE — Assessment & Plan Note (Signed)
Adequately controlled. Continue Propranolol 40 mg bid and Losartan 50 mg daily. Eye exam is current. Continue low salt diet. F/U in 5-6 months, before if needed.

## 2018-12-20 NOTE — Assessment & Plan Note (Signed)
Mildly worse but she does not think meds need to be added. Continue Cymbalta 60 mg daily and Xanax 0.25 mg daily as needed. Some side effects discussed. F/U in 5-6 months,before if needed.

## 2018-12-20 NOTE — Assessment & Plan Note (Signed)
Problem is better controlled with Protonix 40 mg,so no changes. GERD precautions also recommended.

## 2018-12-20 NOTE — Assessment & Plan Note (Signed)
Last TSH in normal range. No changes in current management. We will check TSH next OV.

## 2018-12-22 ENCOUNTER — Ambulatory Visit: Payer: PPO | Admitting: Family Medicine

## 2019-01-09 ENCOUNTER — Other Ambulatory Visit: Payer: Self-pay

## 2019-01-09 ENCOUNTER — Other Ambulatory Visit: Payer: Self-pay | Admitting: Family Medicine

## 2019-01-09 ENCOUNTER — Encounter: Payer: Self-pay | Admitting: Family Medicine

## 2019-01-09 DIAGNOSIS — G4700 Insomnia, unspecified: Secondary | ICD-10-CM

## 2019-01-09 NOTE — Telephone Encounter (Signed)
Last filled 09/13/2018 Last OV 12/20/2018  Ok to fill?

## 2019-01-09 NOTE — Patient Outreach (Addendum)
[facility]) Care Management  01/09/2019  Kristina [name] [birth year] [id number]    Late Entry  Successful outreach to the patient on 11/14/18 for HRA/HTA screening follow up.  HIPAA verified by the patient.  Patient states she has hypertension and it is well controlled. The patient declines any education regarding her hypertension. She monitors her blood pressure at home 2-3 times a week and her pressures are in the normal range.  She declines any information about advance directive. She states she is able to afford her medications and has transportation to appointments.  Discussed [facility] services and she agrees to have a follow up call in six months.  Plan:  RN Health Coach will place the patient in HRA Program with a six month follow up.  Successful and welcome letter will be mailed to the patient. RN Health Coach will contact patient in the month of October and patient agrees to next outreach.     Lazaro Arms RN, BSN, [place] Direct Dial:  [phone]  Fax: [phone]

## 2019-01-18 ENCOUNTER — Telehealth: Payer: Self-pay | Admitting: Family Medicine

## 2019-01-18 NOTE — Telephone Encounter (Unsigned)
Copied from [place] [id number]. Topic: Quick Communication - See Telephone Encounter >> Jan 18, 2019 10:15 AM Valla Leaver wrote: CRM for notification. See Telephone encounter for: 01/18/19. Patient says PA on Flexeril was denied which is a Tier 4 drug, and it needs to be appealed now along with a request to lower the Tier in order for insurance to cover it. Please advise.

## 2019-01-23 NOTE — Telephone Encounter (Signed)
Pt calling to check status. Pt would like  Call back to confirm that this is being worked on. Please advise   Cb# [phone]

## 2019-01-25 NOTE — Telephone Encounter (Signed)
Called and spoke with patient. She is aware that this PA is being worked on.

## 2019-01-31 NOTE — Telephone Encounter (Signed)
PA is still being processed.  Key: [id number]

## 2019-02-08 ENCOUNTER — Encounter: Payer: Self-pay | Admitting: Family Medicine

## 2019-02-12 NOTE — Telephone Encounter (Signed)
Please advise. Patient is needing a refill on flexeril.

## 2019-02-12 NOTE — Telephone Encounter (Signed)
Replied to patients previous mychart message.

## 2019-02-14 ENCOUNTER — Telehealth: Payer: Self-pay | Admitting: *Deleted

## 2019-02-14 MED ORDER — CYCLOBENZAPRINE HCL 10 MG PO TABS: 10.0000 mg | ORAL_TABLET | Freq: Every day | ORAL | 1 refills | Status: DC

## 2019-02-14 NOTE — Telephone Encounter (Signed)
Pt need to speak to the nurse before the the prescription is sent because of the tier. Please call pt.

## 2019-02-14 NOTE — Telephone Encounter (Signed)
Please send refill to [facility] for 90D supply with refills. Pt is asking for this to be sent ASAP as she is out of medication.

## 2019-02-14 NOTE — Telephone Encounter (Signed)
Spoke with and informed her that medication is a tier 1 and that it was sent to [facility] with a 90 day supply with 1 refill. Patient verbalized understanding.

## 2019-02-15 DIAGNOSIS — M545 Low back pain: Secondary | ICD-10-CM | POA: Diagnosis not present

## 2019-02-15 DIAGNOSIS — R202 Paresthesia of skin: Secondary | ICD-10-CM | POA: Diagnosis not present

## 2019-02-16 ENCOUNTER — Other Ambulatory Visit: Payer: Self-pay | Admitting: *Deleted

## 2019-02-16 MED ORDER — CYCLOBENZAPRINE HCL 10 MG PO TABS: 10.0000 mg | ORAL_TABLET | Freq: Every day | ORAL | 1 refills | Status: DC

## 2019-02-16 NOTE — Telephone Encounter (Signed)
Patient informed that Rx was sent to the pharmacy.

## 2019-02-16 NOTE — Telephone Encounter (Signed)
Patient would like to know if this medication could be sent to [facility] on chart instead of mail order. Patient states her insurance is not approving medication and will be able to use GoodRx with [facility].

## 2019-02-23 ENCOUNTER — Other Ambulatory Visit: Payer: Self-pay | Admitting: Family Medicine

## 2019-02-23 DIAGNOSIS — M180 Bilateral primary osteoarthritis of first carpometacarpal joints: Secondary | ICD-10-CM | POA: Diagnosis not present

## 2019-02-23 DIAGNOSIS — M79644 Pain in right finger(s): Secondary | ICD-10-CM | POA: Diagnosis not present

## 2019-02-23 DIAGNOSIS — F418 Other specified anxiety disorders: Secondary | ICD-10-CM

## 2019-02-23 DIAGNOSIS — M65312 Trigger thumb, left thumb: Secondary | ICD-10-CM | POA: Diagnosis not present

## 2019-02-23 DIAGNOSIS — M79645 Pain in left finger(s): Secondary | ICD-10-CM | POA: Diagnosis not present

## 2019-02-23 DIAGNOSIS — M159 Polyosteoarthritis, unspecified: Secondary | ICD-10-CM

## 2019-02-23 DIAGNOSIS — M720 Palmar fascial fibromatosis [Dupuytren]: Secondary | ICD-10-CM | POA: Diagnosis not present

## 2019-02-23 MED ORDER — LEVOTHYROXINE SODIUM 25 MCG PO TABS: 25.0000 ug | ORAL_TABLET | Freq: Every day | ORAL | 3 refills | Status: DC

## 2019-02-23 MED ORDER — PROPRANOLOL HCL 40 MG PO TABS: 40.0000 mg | ORAL_TABLET | Freq: Two times a day (BID) | ORAL | 2 refills | Status: DC

## 2019-02-23 MED ORDER — LOSARTAN POTASSIUM 50 MG PO TABS: 50.0000 mg | ORAL_TABLET | Freq: Every day | ORAL | 2 refills | Status: DC

## 2019-02-23 MED ORDER — DULOXETINE HCL 60 MG PO CPEP: 60.0000 mg | ORAL_CAPSULE | Freq: Every day | ORAL | 2 refills | Status: DC

## 2019-02-24 DIAGNOSIS — M6530 Trigger finger, unspecified finger: Secondary | ICD-10-CM | POA: Insufficient documentation

## 2019-02-24 DIAGNOSIS — M720 Palmar fascial fibromatosis [Dupuytren]: Secondary | ICD-10-CM | POA: Insufficient documentation

## 2019-02-24 DIAGNOSIS — M545 Low back pain: Secondary | ICD-10-CM | POA: Diagnosis not present

## 2019-02-27 ENCOUNTER — Ambulatory Visit: Payer: Self-pay | Admitting: *Deleted

## 2019-02-27 DIAGNOSIS — R0602 Shortness of breath: Secondary | ICD-10-CM | POA: Diagnosis not present

## 2019-02-27 DIAGNOSIS — Z1159 Encounter for screening for other viral diseases: Secondary | ICD-10-CM | POA: Diagnosis not present

## 2019-02-27 NOTE — Telephone Encounter (Signed)
Noted. FYI sent to Dr. Jordan. 

## 2019-02-27 NOTE — Telephone Encounter (Signed)
Pt called with feeling fatigued since Sunday. She stated she  could not take a deep breath, tried inhaler helped a little. She feel tightness in her neck. States she feels better today. Denies dizziness, fever or chest pain. She has a hx of COPD and uses an inhaler.  Per protocol, advised to go to an urgent care.  Attempted to contact [facility] at [facility] for review.  Routing to the practice.   hReason for Disposition . [1] Longstanding difficulty breathing (e.g., CHF, COPD, emphysema) AND [2] WORSE than normal  Answer Assessment - Initial Assessment Questions 1. RESPIRATORY STATUS: "Describe your breathing?" (e.g., wheezing, shortness of breath, unable to speak, severe coughing)      Shortness of breath 2. ONSET: "When did this breathing problem begin?"      Sunday 3. PATTERN "Does the difficult breathing come and go, or has it been constant since it started?"      constant 4. SEVERITY: "How bad is your breathing?" (e.g., mild, moderate, severe)    - MILD: No SOB at rest, mild SOB with walking, speaks normally in sentences, can lay down, no retractions, pulse < 100.    - MODERATE: SOB at rest, SOB with minimal exertion and prefers to sit, cannot lie down flat, speaks in phrases, mild retractions, audible wheezing, pulse 100-120.    - SEVERE: Very SOB at rest, speaks in single words, struggling to breathe, sitting hunched forward, retractions, pulse > 120      moderate 5. RECURRENT SYMPTOM: "Have you had difficulty breathing before?" If so, ask: "When was the last time?" and "What happened that time?"      Yes but not like this 6. CARDIAC HISTORY: "Do you have any history of heart disease?" (e.g., heart attack, angina, bypass surgery, angioplasty)      hypertension 7. LUNG HISTORY: "Do you have any history of lung disease?"  (e.g., pulmonary embolus, asthma, emphysema)     COPD 8. CAUSE: "What do you think is causing the breathing problem?"      Not sure 9. OTHER SYMPTOMS: "Do you have any  other symptoms? (e.g., dizziness, runny nose, cough, chest pain, fever)     Little of balance at times, dry cough 10. PREGNANCY: "Is there any chance you are pregnant?" "When was your last menstrual period?"       no 11. TRAVEL: "Have you traveled out of the country in the last month?" (e.g., travel history, exposures)       no  Protocols used: BREATHING DIFFICULTY-A-AH h

## 2019-03-02 DIAGNOSIS — M5136 Other intervertebral disc degeneration, lumbar region: Secondary | ICD-10-CM | POA: Diagnosis not present

## 2019-03-02 DIAGNOSIS — G894 Chronic pain syndrome: Secondary | ICD-10-CM | POA: Diagnosis not present

## 2019-03-02 DIAGNOSIS — M545 Low back pain: Secondary | ICD-10-CM | POA: Diagnosis not present

## 2019-03-26 ENCOUNTER — Other Ambulatory Visit: Payer: Self-pay

## 2019-03-26 ENCOUNTER — Ambulatory Visit (INDEPENDENT_AMBULATORY_CARE_PROVIDER_SITE_OTHER): Payer: PPO | Admitting: Bariatrics

## 2019-03-26 ENCOUNTER — Encounter (INDEPENDENT_AMBULATORY_CARE_PROVIDER_SITE_OTHER): Payer: Self-pay | Admitting: Bariatrics

## 2019-03-26 VITALS — BP 127/67 | HR 81 | Temp 98.3°F | Ht 62.0 in | Wt 179.0 lb

## 2019-03-26 DIAGNOSIS — R5383 Other fatigue: Secondary | ICD-10-CM | POA: Diagnosis not present

## 2019-03-26 DIAGNOSIS — Z6832 Body mass index (BMI) 32.0-32.9, adult: Secondary | ICD-10-CM

## 2019-03-26 DIAGNOSIS — R0602 Shortness of breath: Secondary | ICD-10-CM

## 2019-03-26 DIAGNOSIS — E559 Vitamin D deficiency, unspecified: Secondary | ICD-10-CM

## 2019-03-26 DIAGNOSIS — E7849 Other hyperlipidemia: Secondary | ICD-10-CM | POA: Diagnosis not present

## 2019-03-26 DIAGNOSIS — E538 Deficiency of other specified B group vitamins: Secondary | ICD-10-CM | POA: Diagnosis not present

## 2019-03-26 DIAGNOSIS — I10 Essential (primary) hypertension: Secondary | ICD-10-CM | POA: Diagnosis not present

## 2019-03-26 DIAGNOSIS — R7303 Prediabetes: Secondary | ICD-10-CM | POA: Diagnosis not present

## 2019-03-26 DIAGNOSIS — E669 Obesity, unspecified: Secondary | ICD-10-CM

## 2019-03-26 DIAGNOSIS — Z1331 Encounter for screening for depression: Secondary | ICD-10-CM

## 2019-03-26 DIAGNOSIS — E038 Other specified hypothyroidism: Secondary | ICD-10-CM

## 2019-03-26 DIAGNOSIS — Z0289 Encounter for other administrative examinations: Secondary | ICD-10-CM

## 2019-03-26 DIAGNOSIS — E66811 Obesity, class 1: Secondary | ICD-10-CM

## 2019-03-27 ENCOUNTER — Encounter: Payer: Self-pay | Admitting: *Deleted

## 2019-03-27 ENCOUNTER — Other Ambulatory Visit: Payer: Self-pay | Admitting: *Deleted

## 2019-03-27 LAB — COMPREHENSIVE METABOLIC PANEL
ALT: 22 IU/L (ref 0–32)
AST: 23 IU/L (ref 0–40)
Albumin/Globulin Ratio: 1.5 (ref 1.2–2.2)
Albumin: 3.9 g/dL (ref 3.8–4.8)
Alkaline Phosphatase: 81 IU/L (ref 39–117)
BUN/Creatinine Ratio: 11 — ABNORMAL LOW (ref 12–28)
BUN: 10 mg/dL (ref 8–27)
Bilirubin Total: 0.3 mg/dL (ref 0.0–1.2)
CO2: 21 mmol/L (ref 20–29)
Calcium: 8.9 mg/dL (ref 8.7–10.3)
Chloride: 100 mmol/L (ref 96–106)
Creatinine, Ser: 0.89 mg/dL (ref 0.57–1.00)
GFR calc Af Amer: 77 mL/min/{1.73_m2} (ref >59)
GFR calc non Af Amer: 67 mL/min/{1.73_m2} (ref >59)
Globulin, Total: 2.6 g/dL (ref 1.5–4.5)
Glucose: 93 mg/dL (ref 65–99)
Potassium: 4.3 mmol/L (ref 3.5–5.2)
Sodium: 135 mmol/L (ref 134–144)
Total Protein: 6.5 g/dL (ref 6.0–8.5)

## 2019-03-27 LAB — LIPID PANEL WITH LDL/HDL RATIO
Cholesterol, Total: 256 mg/dL — ABNORMAL HIGH (ref 100–199)
HDL: 44 mg/dL (ref >39)
LDL Calculated: 184 mg/dL — ABNORMAL HIGH (ref 0–99)
LDl/HDL Ratio: 4.2 ratio — ABNORMAL HIGH (ref 0.0–3.2)
Triglycerides: 141 mg/dL (ref 0–149)
VLDL Cholesterol Cal: 28 mg/dL (ref 5–40)

## 2019-03-27 LAB — HEMOGLOBIN A1C
Est. average glucose Bld gHb Est-mCnc: 114 mg/dL
Hgb A1c MFr Bld: 5.6 % (ref 4.8–5.6)

## 2019-03-27 LAB — TSH: TSH: 2.17 u[IU]/mL (ref 0.450–4.500)

## 2019-03-27 LAB — T4, FREE: Free T4: 1.36 ng/dL (ref 0.82–1.77)

## 2019-03-27 LAB — VITAMIN D 25 HYDROXY (VIT D DEFICIENCY, FRACTURES): Vit D, 25-Hydroxy: 39.1 ng/mL (ref 30.0–100.0)

## 2019-03-27 LAB — T3: T3, Total: 111 ng/dL (ref 71–180)

## 2019-03-27 LAB — VITAMIN B12: Vitamin B-12: 967 pg/mL (ref 232–1245)

## 2019-03-27 LAB — INSULIN, RANDOM: INSULIN: 11.6 u[IU]/mL (ref 2.6–24.9)

## 2019-03-27 MED ORDER — PROPRANOLOL HCL 40 MG PO TABS: 40.0000 mg | ORAL_TABLET | Freq: Two times a day (BID) | ORAL | 2 refills | Status: DC

## 2019-03-27 NOTE — Progress Notes (Signed)
.  Office: [phone]  /  Fax: [phone]   HPI:   Chief Complaint: OBESITY  Kristina [name] (MR# [id number]) is a 68 Kristina [name].o. female who presents on 03/27/2019 for obesity evaluation and treatment. Current BMI is Body mass index is 32.74 kg/m.Marland Kitchen Kristina [name] has struggled with obesity for years and has been unsuccessful in either losing weight or maintaining long term weight loss. Kristina [name] attended our information session and states she is currently in the action stage of change and ready to dedicate time achieving and maintaining a healthier weight.  Kristina [name] states her desired weight loss is 39 lbs. she started gaining weight in 2009 her heaviest weight ever was 181 lbs. she states that she does not like to cook and she states no motivation she is a picky eater and doesn't like to eat healthier foods  she craves chocolate, and salty chips and nuts she states after eating, she needs chocolate she states cravings are her downfall she snacks frequently in the evenings she is frequently drinking liquids with calories she frequently makes poor food choices she has problems with excessive hunger  she occasionally eats larger portions than normal  she struggles with emotional eating    Fatigue Kristina [name] feels her energy is lower than it should be. This has worsened with weight gain and has not worsened recently. Kristina [name] admits to daytime somnolence and he denies waking up still tired. Patient is at risk for obstructive sleep apnea. Patent has a history of symptoms of daytime fatigue, morning fatigue and hypertension. Patient generally gets 7 hours of sleep per night, and states they generally have restful sleep. Snoring is present. Apneic episodes are not present. Epworth Sleepiness Score is 6  Dyspnea on exertion Kristina [name] notes increasing shortness of breath with certain activities and seems to be worsening over time with weight gain. She notes getting out of breath sooner with activity than she used to. This has not  gotten worse recently. Kristina [name] denies orthopnea.  Hypertension Kristina [name] is a 68 Kristina [name].o. female with hypertension. She is taking Cozaar. Kristina [name] denies lightheadedness. She is working weight loss to help control her blood pressure with the goal of decreasing her risk of heart attack and stroke. Kristina [name] blood pressure is well controlled.  Vitamin D deficiency Kristina [name] has a diagnosis of vitamin D deficiency. She is taking OTC vitamin D. Kristina [name] denies nausea, vomiting or muscle weakness.  B12 Deficiency Kristina [name] has a diagnosis of B12 insufficiency and she notes fatigue. She is taking OTC B12 supplement.  Kristina [name] is not a vegetarian and does not have a previous diagnosis of pernicious anemia. She does not have a history of weight loss surgery. Kristina [name] denies paresthesias.  Hypothyroidism Kristina [name] has a diagnosis of hypothyroidism. She is currently taking synthroid. Kristina [name] has a history of thyroid nodules and goiter. She admits cold intolerance.  Pre-Diabetes Kristina [name] has a diagnosis of prediabetes based on her elevated Hgb A1c and was informed this puts her at greater risk of developing diabetes. She is not taking metformin currently and continues to work on diet and exercise to decrease risk of diabetes. Kristina [name] has a normal appetite.  Depression Screen Kristina [name]'s Food and Mood (modified PHQ-9) score was  Depression screen PHQ 2/9 03/26/2019  Decreased Interest 1  Down, Depressed, Hopeless 2  PHQ - 2 Score 3  Altered sleeping 0  Tired, decreased energy 3  Change in appetite 2  Feeling bad or failure about yourself  2  Trouble concentrating 1  Moving slowly or fidgety/restless 1  Suicidal thoughts 0  PHQ-9 Score 12  Difficult doing work/chores Somewhat difficult    ASSESSMENT AND PLAN:  Other fatigue - Plan: EKG 12-Lead, T3, T4, free, TSH  Shortness of breath on exertion  Essential hypertension  Other hyperlipidemia - Plan: Lipid Panel With LDL/HDL Ratio  Vitamin D deficiency - Plan: VITAMIN D  25 Hydroxy (Vit-D Deficiency, Fractures)  B12 nutritional deficiency - Plan: Vitamin B12  Other specified hypothyroidism  Prediabetes - Plan: Comprehensive metabolic panel, Hemoglobin A1c, Insulin, random  Depression screening  Class 1 obesity with serious comorbidity and body mass index (BMI) of 32.0 to 32.9 in adult, unspecified obesity type  PLAN:  Fatigue Kristina [name] was informed that her fatigue may be related to obesity, depression or many other causes. Labs will be ordered, and in the meanwhile Kristina [name] has agreed to work on diet, exercise and weight loss to help with fatigue. Proper sleep hygiene was discussed including the need for 7-8 hours of quality sleep each night. A sleep study was not ordered based on symptoms and Epworth score.  Dyspnea on exertion Kristina [name]'s shortness of breath appears to be obesity related and exercise induced. She has agreed to work on weight loss and gradually increase exercise to treat her exercise induced shortness of breath. If Kristina [name] follows our instructions and loses weight without improvement of her shortness of breath, we will plan to refer to pulmonology. We will monitor this condition regularly. Kristina [name] agrees to this plan.  Hypertension We discussed sodium restriction, working on healthy weight loss, and a regular exercise program as the means to achieve improved blood pressure control. Kristina [name] agreed with this plan and agreed to follow up as directed. We will continue to monitor her blood pressure as well as her progress with the above lifestyle modifications. She will continue her medications as prescribed and will watch for signs of hypotension as she continues her lifestyle modifications.  Hyperlipidemia Kristina [name] was informed of the American Heart Association Guidelines emphasizing intensive lifestyle modifications as the first line treatment for hyperlipidemia. We discussed many lifestyle modifications today in depth, and Roslynn will work on decreasing  saturated fats such as fatty red meat, butter and many fried foods. She will also increase vegetables and lean protein in her diet and work on exercise and weight loss efforts. We will check lipids today and Myley will follow up as directed.  Vitamin D Deficiency Kristina [name] was informed that low vitamin D levels contributes to fatigue and are associated with obesity, breast, and colon cancer. Kristina [name] will continue to take OTC vitamin D. We will check vitamin D level and she will follow up for routine testing of vitamin D, at least 2-3 times per year. She was informed of the risk of over-replacement of vitamin D and agrees to not increase her dose unless she discusses this with [place] first.  B12 Deficiency Yaire will work on increasing B12 rich foods in her diet. B12 supplementation was not prescribed today. We will check vitamin B12 level and Jessicaann will follow up as directed.  Pre-Diabetes Shanekia will continue to work on weight loss, exercise, and decreasing simple carbohydrates in her diet to help decrease the risk of diabetes. She was informed that eating too many simple carbohydrates or too many calories at one sitting increases the likelihood of GI side effects. We will check Hgb A1c and insulin level and Chavie agreed to follow up with [place] as directed to monitor her progress.  Depression Screen Kristina [name] had a moderately positive depression screening. Depression is  commonly associated with obesity and often results in emotional eating behaviors. We will monitor this closely and work on CBT to help improve the non-hunger eating patterns. Referral to Psychology may be required if no improvement is seen as she continues in our clinic.  Obesity Kristina [name] is currently in the action stage of change and her goal is to continue with weight loss efforts She has agreed to follow the Category 1 plan Kristina [name] has been instructed to work up to a goal of 150 minutes of combined cardio and strengthening exercise per week for weight  loss and overall health benefits. We discussed the following Behavioral Modification Strategies today: planning for success, increase H2O intake, no skipping meals, keeping healthy foods in the home, increasing lean protein intake, decreasing simple carbohydrates, increasing vegetables, decrease eating out and work on meal planning and intentional eating Jamila will decrease snacking and she will stick with the plan.  Kristina [name] has agreed to follow up with our clinic in 2 weeks. She was informed of the importance of frequent follow up visits to maximize her success with intensive lifestyle modifications for her multiple health conditions. She was informed we would discuss her lab results at her next visit unless there is a critical issue that needs to be addressed sooner. Kristina [name] agreed to keep her next visit at the agreed upon time to discuss these results.  ALLERGIES: Allergies  Allergen Reactions  . Meloxicam Other (See Comments)    "skin infection on leg"  . Sulfa Antibiotics Rash    Rash all over body/fever  . Hydrocodone Nausea Only    Per patient     MEDICATIONS: Current Outpatient Medications on File Prior to Visit  Medication Sig Dispense Refill  . albuterol (PROVENTIL HFA;VENTOLIN HFA) 108 (90 Base) MCG/ACT inhaler Inhale 2 puffs into the lungs every 6 (six) hours as needed for wheezing or shortness of breath. 1 Inhaler 2  . ALPRAZolam (XANAX) 0.25 MG tablet TAKE 1 TABLET BY MOUTH AT BEDTIME AS NEEDED FOR ANXIETY AND FOR SLEEP 30 tablet 2  . Cholecalciferol (VITAMIN D3) 25 MCG (1000 UT) CAPS Take by mouth.    . cyclobenzaprine (FLEXERIL) 10 MG tablet Take 1 tablet (10 mg total) by mouth daily. 90 tablet 1  . diclofenac sodium (VOLTAREN) 1 % GEL Apply 3 gm to 3 large joints up to 3 times a day.Dispense 3 tubes with 3 refills. 4 Tube 3  . DULoxetine (CYMBALTA) 60 MG capsule Take 1 capsule (60 mg total) by mouth daily. 90 capsule 2  . estradiol (VIVELLE-DOT) 0.0375 MG/24HR APPLY 1 PATCH  TOPICALLY TWICE A WEEK  8  . levothyroxine (SYNTHROID) 25 MCG tablet Take 1 tablet (25 mcg total) by mouth daily before breakfast. 90 tablet 3  . losartan (COZAAR) 50 MG tablet Take 1 tablet (50 mg total) by mouth daily. 90 tablet 2  . Magnesium 250 MG TABS Take by mouth daily.    . meclizine (ANTIVERT) 12.5 MG tablet Take 1 tablet (12.5 mg total) by mouth 3 (three) times daily as needed for dizziness. 60 tablet 1  . Multiple Vitamin (MULTIVITAMIN) tablet Take 1 tablet by mouth daily.    . pantoprazole (PROTONIX) 40 MG tablet Take 1 tablet (40 mg total) by mouth daily. 90 tablet 3  . vitamin B-12 (CYANOCOBALAMIN) 1000 MCG tablet Take 1,000 mcg by mouth once a week.    . Azelaic Acid (FINACEA) 15 % cream Apply 1 application topically 2 (two) times daily. After skin is thoroughly washed and patted  dry, gently but thoroughly massage a thin film of azelaic acid cream into the affected area twice daily, in the morning and evening.     No current facility-administered medications on file prior to visit.     PAST MEDICAL HISTORY: Past Medical History:  Diagnosis Date  . Anxiety   . Arthritis   . Back pain   . Cancer ([place])    basal and squamous cell skin  . Chicken pox   . Chronic back pain   . COPD (chronic obstructive pulmonary disease) ([place])   . Depression   . Gallbladder problem   . GERD (gastroesophageal reflux disease)   . Hypertension   . Hypothyroidism   . Joint pain   . Lactose intolerance   . Migraines   . Osteoarthritis   . SOB (shortness of breath)   . Thyroid disease   . Tremor   . Vitamin D deficiency     PAST SURGICAL HISTORY: Past Surgical History:  Procedure Laterality Date  . ABDOMINAL HYSTERECTOMY  1978  . CATARACT EXTRACTION Left 12/20/2017   will have the right one completed a month later   . CHOLECYSTECTOMY  2000  . COLONOSCOPY  2007  . JOINT REPLACEMENT Right 08/19/2017   great toe   . UPPER GASTROINTESTINAL ENDOSCOPY  [id number]    SOCIAL HISTORY:  Social History   Tobacco Use  . Smoking status: Former Smoker    Packs/day: 1.50    Years: 25.00    Pack years: 34.50    Quit date: 11/02/1990    Years since quitting: 28.4  . Smokeless tobacco: Never Used  Substance Use Topics  . Alcohol use: Yes    Comment: glass of wine once a month  . Drug use: No    FAMILY HISTORY: Family History  Problem Relation Age of Onset  . Arthritis Mother   . Diabetes Mother   . COPD Mother   . Heart failure Mother   . Stroke Father   . Hypertension Father   . Hyperlipidemia Father   . Diabetes Father   . CAD Father   . Heart disease Father   . Breast cancer Maternal Aunt   . Cancer Sister        bladder cancer   . Throat cancer Brother   . Colon cancer Neg Hx   . Stomach cancer Neg Hx   . Esophageal cancer Neg Hx     ROS: Review of Systems  Constitutional: Positive for malaise/fatigue.  HENT:       Positive for Dry Mouth  Eyes:       + Wear Glasses or Contacts Positive for Flashes of Light Positive for Floaters  Respiratory: Positive for shortness of breath.   Cardiovascular: Negative for orthopnea.       Positive for Shortness of Breath with Activity Positive for Very Cold Feet or Hands  Gastrointestinal: Positive for heartburn. Negative for nausea and vomiting.  Musculoskeletal: Positive for back pain.       Positive for Muscle Stiffness Positive for Muscle or Joint Pain Negative for muscle weakness  Skin: Positive for itching.  Neurological: Positive for tremors. Negative for tingling.       Negative for lightheadedness  Endo/Heme/Allergies: Positive for polydipsia. Bruises/bleeds easily.       Positive for Cold Intolerance Negative for polyphagia    PHYSICAL EXAM: Blood pressure 127/67, pulse 81, temperature 98.3 F (36.8 C), temperature source Oral, height 5\' 2"  (1.575 m), weight 179 lb (81.2 kg), SpO2 97 %.  Body mass index is 32.74 kg/m. Physical Exam Vitals signs reviewed.  Constitutional:      Appearance:  Normal appearance. She is well-developed. She is obese.  HENT:     Head: Normocephalic and atraumatic.     Nose: Nose normal.  Eyes:     General: No scleral icterus.    Extraocular Movements: Extraocular movements intact.  Neck:     Musculoskeletal: Normal range of motion and neck supple.     Thyroid: No thyromegaly.  Cardiovascular:     Rate and Rhythm: Normal rate and regular rhythm.  Pulmonary:     Effort: Pulmonary effort is normal. No respiratory distress.  Abdominal:     Palpations: Abdomen is soft.     Tenderness: There is no abdominal tenderness.  Musculoskeletal: Normal range of motion.  Skin:    General: Skin is warm and dry.  Neurological:     Mental Status: She is alert and oriented to person, place, and time.     Coordination: Coordination normal.  Psychiatric:        Mood and Affect: Mood normal.        Behavior: Behavior normal.     RECENT LABS AND TESTS: BMET    Component Value Date/Time   NA 135 03/26/2019 1058   K 4.3 03/26/2019 1058   CL 100 03/26/2019 1058   CO2 21 03/26/2019 1058   GLUCOSE 93 03/26/2019 1058   GLUCOSE 101 (H) 05/19/2018 1044   BUN 10 03/26/2019 1058   CREATININE 0.89 03/26/2019 1058   CREATININE 0.91 09/09/2017 0959   CALCIUM 8.9 03/26/2019 1058   GFRNONAA 67 03/26/2019 1058   GFRNONAA 65 09/09/2017 0959   GFRAA 77 03/26/2019 1058   GFRAA 76 09/09/2017 0959   Lab Results  Component Value Date   HGBA1C 5.6 03/26/2019   Lab Results  Component Value Date   INSULIN 11.6 03/26/2019   CBC    Component Value Date/Time   WBC 11.6 (H) 01/18/2018 1400   RBC 4.69 01/18/2018 1400   HGB 14.6 01/18/2018 1400   HCT 42.8 01/18/2018 1400   PLT 308 01/18/2018 1400   MCV 91.3 01/18/2018 1400   MCH 31.1 01/18/2018 1400   MCHC 34.1 01/18/2018 1400   RDW 13.1 01/18/2018 1400   LYMPHSABS 1.0 01/18/2018 1400   MONOABS 0.5 01/18/2018 1400   EOSABS 0.1 01/18/2018 1400   BASOSABS 0.0 01/18/2018 1400   Iron/TIBC/Ferritin/ %Sat No  results found for: IRON, TIBC, FERRITIN, IRONPCTSAT Lipid Panel     Component Value Date/Time   CHOL 256 (H) 03/26/2019 1058   TRIG 141 03/26/2019 1058   HDL 44 03/26/2019 1058   CHOLHDL 4.2 08/04/2017 0830   LDLCALC 184 (H) 03/26/2019 1058   Hepatic Function Panel     Component Value Date/Time   PROT 6.5 03/26/2019 1058   ALBUMIN 3.9 03/26/2019 1058   AST 23 03/26/2019 1058   ALT 22 03/26/2019 1058   ALKPHOS 81 03/26/2019 1058   BILITOT 0.3 03/26/2019 1058      Component Value Date/Time   TSH 2.170 03/26/2019 1058   Vitamin D  Ref. Range 09/09/2017 09:59  Vitamin D, 25-Hydroxy Latest Ref Range: 30 - 100 ng/mL 29 (L)    ECG  shows NSR with a rate of 82 BPM INDIRECT CALORIMETER done today shows a VO2 of 183 and a REE of 1271. Her calculated basal metabolic rate is 3734 thus her basal metabolic rate is worse than expected.       OBESITY  BEHAVIORAL INTERVENTION VISIT  Today's visit was # 1   Starting weight: 179 lbs Starting date: 03/26/2019 Today's weight : 179 lbs Today's date: 03/26/2019 Total lbs lost to date: 0    03/26/2019  Height 5\' 2"  (1.575 m)  Weight 179 lb (81.2 kg)  BMI (Calculated) 32.73  BLOOD PRESSURE - SYSTOLIC 597  BLOOD PRESSURE - DIASTOLIC 67  Waist Measurement  39 inches   Body Fat % 48.7 %  Total Body Water (lbs) 68 lbs    ASK: We discussed the diagnosis of obesity with Bailey Mech today and Vashon agreed to give [place] permission to discuss obesity behavioral modification therapy today.  ASSESS: Clorissa has the diagnosis of obesity and her BMI today is 32.73 Rainey is in the action stage of change   ADVISE: Marwah was educated on the multiple health risks of obesity as well as the benefit of weight loss to improve her health. She was advised of the need for long term treatment and the importance of lifestyle modifications to improve her current health and to decrease her risk of future health problems.  AGREE: Multiple dietary modification  options and treatment options were discussed and  Chanya agreed to follow the recommendations documented in the above note.  ARRANGE: Chastity was educated on the importance of frequent visits to treat obesity as outlined per CMS and USPSTF guidelines and agreed to schedule her next follow up appointment today.   Corey Skains, am acting as Location manager for [employer]. Owens Shark, DO   I have reviewed the above documentation for accuracy and completeness, and I agree with the above. -Jearld Lesch, DO

## 2019-03-28 ENCOUNTER — Ambulatory Visit (INDEPENDENT_AMBULATORY_CARE_PROVIDER_SITE_OTHER): Payer: PPO | Admitting: Diagnostic Neuroimaging

## 2019-03-28 ENCOUNTER — Encounter: Payer: Self-pay | Admitting: Diagnostic Neuroimaging

## 2019-03-28 ENCOUNTER — Encounter (INDEPENDENT_AMBULATORY_CARE_PROVIDER_SITE_OTHER): Payer: Self-pay | Admitting: Bariatrics

## 2019-03-28 ENCOUNTER — Other Ambulatory Visit: Payer: Self-pay

## 2019-03-28 VITALS — BP 129/78 | HR 73 | Temp 96.8°F | Ht 62.0 in | Wt 181.0 lb

## 2019-03-28 DIAGNOSIS — R200 Anesthesia of skin: Secondary | ICD-10-CM

## 2019-03-28 DIAGNOSIS — M5416 Radiculopathy, lumbar region: Secondary | ICD-10-CM | POA: Diagnosis not present

## 2019-03-28 NOTE — Progress Notes (Signed)
[facility]  PATIENT: Kristina [name] DOB: May 09, 1951  REFERRING CLINICIAN: Ramos HISTORY FROM: patient  REASON FOR VISIT: new consult   HISTORICAL  CHIEF COMPLAINT:  Chief Complaint  Patient presents with  . Paresthesia of lower extremity    rm 7 New Pt, "some numbness in my legs, back pain"    HISTORY OF PRESENT ILLNESS:   68 year old female here for evaluation of numbness and tingling.  2005 patient had a "broken disc" at L4-5 resulting in left leg numbness and pain.  Patient underwent epidural injections and physical therapy with good relief.  Since that time patient has had intermittent numbness and tingling in her left leg below her left knee.  In the past few months she has noticed some intermittent sensations in her right foot and toes as well as right hip and groin region.    REVIEW OF SYSTEMS: Full 14 system review of systems performed and negative with exception of: As per HPI.  ALLERGIES: Allergies  Allergen Reactions  . Meloxicam Other (See Comments)    "skin infection on leg"  . Sulfa Antibiotics Rash    Rash all over body/fever  . Hydrocodone Nausea Only    Per patient     HOME MEDICATIONS: Outpatient Medications Prior to Visit  Medication Sig Dispense Refill  . Acetaminophen (TYLENOL PO) Take 400 mg by mouth 2 (two) times daily.    Marland Kitchen albuterol (PROVENTIL HFA;VENTOLIN HFA) 108 (90 Base) MCG/ACT inhaler Inhale 2 puffs into the lungs every 6 (six) hours as needed for wheezing or shortness of breath. 1 Inhaler 2  . ALPRAZolam (XANAX) 0.25 MG tablet TAKE 1 TABLET BY MOUTH AT BEDTIME AS NEEDED FOR ANXIETY AND FOR SLEEP 30 tablet 2  . Azelaic Acid (FINACEA) 15 % cream Apply 1 application topically 2 (two) times daily. After skin is thoroughly washed and patted dry, gently but thoroughly massage a thin film of azelaic acid cream into the affected area twice daily, in the morning and evening.    . Cholecalciferol (VITAMIN D3) 25 MCG (1000 UT)  CAPS Take by mouth.    . cyclobenzaprine (FLEXERIL) 10 MG tablet Take 1 tablet (10 mg total) by mouth daily. 90 tablet 1  . diclofenac sodium (VOLTAREN) 1 % GEL Apply 3 gm to 3 large joints up to 3 times a day.Dispense 3 tubes with 3 refills. 4 Tube 3  . DULoxetine (CYMBALTA) 60 MG capsule Take 1 capsule (60 mg total) by mouth daily. 90 capsule 2  . estradiol (VIVELLE-DOT) 0.0375 MG/24HR APPLY 1 PATCH TOPICALLY TWICE A WEEK  8  . levothyroxine (SYNTHROID) 25 MCG tablet Take 1 tablet (25 mcg total) by mouth daily before breakfast. 90 tablet 3  . losartan (COZAAR) 50 MG tablet Take 1 tablet (50 mg total) by mouth daily. 90 tablet 2  . Magnesium 250 MG TABS Take by mouth daily.    . meclizine (ANTIVERT) 12.5 MG tablet Take 1 tablet (12.5 mg total) by mouth 3 (three) times daily as needed for dizziness. 60 tablet 1  . Multiple Vitamin (MULTIVITAMIN) tablet Take 1 tablet by mouth daily.    . pantoprazole (PROTONIX) 40 MG tablet Take 1 tablet (40 mg total) by mouth daily. 90 tablet 3  . propranolol (INDERAL) 40 MG tablet Take 1 tablet (40 mg total) by mouth 2 (two) times daily. 180 tablet 2  . Propylene Glycol (SYSTANE BALANCE OP) Apply to eye.    . vitamin B-12 (CYANOCOBALAMIN) 1000 MCG tablet Take 1,000 mcg by mouth  once a week.     No facility-administered medications prior to visit.     PAST MEDICAL HISTORY: Past Medical History:  Diagnosis Date  . Anxiety   . Arthritis   . Back pain   . Cancer ([place])    basal and squamous cell skin  . Chicken pox   . Chronic back pain   . COPD (chronic obstructive pulmonary disease) ([place])   . Depression   . Gallbladder problem   . GERD (gastroesophageal reflux disease)   . Hypertension   . Hypothyroidism   . Joint pain   . Lactose intolerance   . Migraines   . Osteoarthritis   . SOB (shortness of breath)   . Thyroid disease   . Tremor   . Vitamin D deficiency     PAST SURGICAL HISTORY: Past Surgical History:  Procedure Laterality Date  .  ABDOMINAL HYSTERECTOMY  1978  . CATARACT EXTRACTION Left 12/20/2017   will have the right one completed a month later   . CHOLECYSTECTOMY  2000  . COLONOSCOPY  2007  . JOINT REPLACEMENT Right 08/19/2017   great toe   . UPPER GASTROINTESTINAL ENDOSCOPY  [id number]    FAMILY HISTORY: Family History  Problem Relation Age of Onset  . Arthritis Mother   . Diabetes Mother   . COPD Mother   . Heart failure Mother   . Stroke Father   . Hypertension Father   . Hyperlipidemia Father   . Diabetes Father   . CAD Father   . Heart disease Father   . Heart failure Father   . Breast cancer Maternal Aunt   . Cancer Sister        bladder cancer   . Throat cancer Brother   . Colon cancer Neg Hx   . Stomach cancer Neg Hx   . Esophageal cancer Neg Hx     SOCIAL HISTORY: Social History   Socioeconomic History  . Marital status: Single    Spouse name: Not on file  . Number of children: 0  . Years of education: 13  . Highest education level: Not on file  Occupational History  . Occupation: retired    Comment: administration  Social Needs  . Financial resource strain: Not on file  . Food insecurity    Worry: Not on file    Inability: Not on file  . Transportation needs    Medical: Not on file    Non-medical: Not on file  Tobacco Use  . Smoking status: Former Smoker    Packs/day: 1.50    Years: 25.00    Pack years: 65.50    Quit date: 03/03/1990    Years since quitting: 29.0  . Smokeless tobacco: Never Used  Substance and Sexual Activity  . Alcohol use: Yes    Comment: glass of wine once a month  . Drug use: No  . Sexual activity: Never  Lifestyle  . Physical activity    Days per week: Not on file    Minutes per session: Not on file  . Stress: Not on file  Relationships  . Social Herbalist on phone: Not on file    Gets together: Not on file    Attends religious service: Not on file    Active member of club or organization: Not on file    Attends meetings of  clubs or organizations: Not on file    Relationship status: Not on file  . Intimate partner violence  Fear of current or ex partner: Not on file    Emotionally abused: Not on file    Physically abused: Not on file    Forced sexual activity: Not on file  Other Topics Concern  . Not on file  Social History Narrative   Lives alone   Caffeine- tea, 1 cup     PHYSICAL EXAM  GENERAL EXAM/CONSTITUTIONAL: Vitals:  Vitals:   03/28/19 0940  BP: 129/78  Pulse: 73  Temp: (!) 96.8 F (36 C)  Weight: 181 lb (82.1 kg)  Height: 5\' 2"  (1.575 m)   Body mass index is 33.11 kg/m. Wt Readings from Last 3 Encounters:  03/28/19 181 lb (82.1 kg)  03/26/19 179 lb (81.2 kg)  08/23/18 181 lb 2 oz (82.2 kg)    Patient is in no distress; well developed, nourished and groomed; neck is supple  CARDIOVASCULAR:  Examination of carotid arteries is normal; no carotid bruits  Regular rate and rhythm, no murmurs  Examination of peripheral vascular system by observation and palpation is normal  EYES:  Ophthalmoscopic exam of optic discs and posterior segments is normal; no papilledema or hemorrhages No exam data present  MUSCULOSKELETAL:  Gait, strength, tone, movements noted in Neurologic exam below  NEUROLOGIC: MENTAL STATUS:  MMSE - [place] Exam 12/16/2017  Not completed: (No Data)    awake, alert, oriented to person, place and time  recent and remote memory intact  normal attention and concentration  language fluent, comprehension intact, naming intact  fund of knowledge appropriate  CRANIAL NERVE:   2nd - no papilledema on fundoscopic exam  2nd, 3rd, 4th, 6th - pupils equal and reactive to light, visual fields full to confrontation, extraocular muscles intact, no nystagmus  5th - facial sensation symmetric  7th - facial strength symmetric  8th - hearing intact  9th - palate elevates symmetrically, uvula midline  11th - shoulder shrug symmetric  12th -  tongue protrusion midline  MOTOR:   normal bulk and tone, full strength in the BUE, BLE  SENSORY:   normal and symmetric to light touch, pinprick, temperature, vibration; EXCEPT SLIGHT DECR IN LEFT FOOT /LEG BELOW KNEE  COORDINATION:   finger-nose-finger, fine finger movements normal  REFLEXES:   deep tendon reflexes TRACE and symmetric  GAIT/STATION:   narrow based gait     DIAGNOSTIC DATA (LABS, IMAGING, TESTING) - I reviewed patient records, labs, notes, testing and imaging myself where available.  Lab Results  Component Value Date   WBC 11.6 (H) 01/18/2018   HGB 14.6 01/18/2018   HCT 42.8 01/18/2018   MCV 91.3 01/18/2018   PLT 308 01/18/2018      Component Value Date/Time   NA 135 03/26/2019 1058   K 4.3 03/26/2019 1058   CL 100 03/26/2019 1058   CO2 21 03/26/2019 1058   GLUCOSE 93 03/26/2019 1058   GLUCOSE 101 (H) 05/19/2018 1044   BUN 10 03/26/2019 1058   CREATININE 0.89 03/26/2019 1058   CREATININE 0.91 09/09/2017 0959   CALCIUM 8.9 03/26/2019 1058   PROT 6.5 03/26/2019 1058   ALBUMIN 3.9 03/26/2019 1058   AST 23 03/26/2019 1058   ALT 22 03/26/2019 1058   ALKPHOS 81 03/26/2019 1058   BILITOT 0.3 03/26/2019 1058   GFRNONAA 67 03/26/2019 1058   GFRNONAA 65 09/09/2017 0959   GFRAA 77 03/26/2019 1058   GFRAA 76 09/09/2017 0959   Lab Results  Component Value Date   CHOL 256 (H) 03/26/2019   HDL 44 03/26/2019  LDLCALC 184 (H) 03/26/2019   TRIG 141 03/26/2019   CHOLHDL 4.2 08/04/2017   Lab Results  Component Value Date   HGBA1C 5.6 03/26/2019   Lab Results  Component Value Date   [id number] 177 03/26/2019   Lab Results  Component Value Date   TSH 2.170 03/26/2019    06/12/18 MRI brain [I reviewed images myself and agree with interpretation. -VRP]  1. No acute or reversible finding. 2. Mild chronic small vessel ischemia.  02/24/19 MRI lumbar spine - L3-4, L4-5 mild spinal stenosis  - multi-level degenerative dz and foraminal stenosis  (L1-2, L5-S1)     ASSESSMENT AND PLAN  68 y.o. year old female here with intermittent patchy, numbness and tingling in bilateral lower extremities, low back pain, with degenerative changes on recent MRI from July 2020.  Lower extremity symptoms likely related to lumbar radiculopathies.  Dx:  1. Numbness   2. Lumbar radiculopathy     PLAN:  LUMBAR RADICULOPATHY / DEGENERATIVE DZ - continue conservative mgmt (exercises, stretching, nutrition, sleep)  Return return to Dr. Nelva Bush, for pending if symptoms worsen or fail to improve.    Penni Bombard, MD 08/24/5788, 38:33 AM Certified in Neurology, Neurophysiology and Neuroimaging  [facility] [address], [place], [place] [address] [phone]

## 2019-03-29 ENCOUNTER — Other Ambulatory Visit: Payer: Self-pay

## 2019-03-29 ENCOUNTER — Ambulatory Visit (INDEPENDENT_AMBULATORY_CARE_PROVIDER_SITE_OTHER): Payer: PPO | Admitting: Psychology

## 2019-03-29 DIAGNOSIS — F3289 Other specified depressive episodes: Secondary | ICD-10-CM | POA: Diagnosis not present

## 2019-03-29 NOTE — Progress Notes (Signed)
Office: [phone]  /  Fax: [phone]    Date: March 29, 2019   Time Seen:  12:03pm Duration: 52 minutes Provider: Glennie Isle, PsyD Type of Session: Intake for Individual Therapy  Type of Contact: Face-to-face  Informed Consent for In-Person Services During COVID-19: During today's appointment, information about the decision to initiate in-person services in light of the [id number]-[id number] public health crisis was discussed. Kristina [name] and this provider agreed to meet in person for some or all future appointments. If there is a resurgence of the pandemic or other health concerns arise, telepsychological services may be initiated and any related concerns will be discussed and an attempt to address them will be made. Kristina [name] verbally acknowledged understanding that if necessary, this provider may determine there is a need to initiate telepsychological services for everyone's well-being. Kristina [name] expressed understanding she may request to initiate telepsychological services, and that request will be respected as long as it is feasible and clinically appropriate. Regarding telepsychological services, Kristina [name] acknowledged she is ultimately responsible for understanding her insurance benefits as it relates to reimbursement of telepsychological services. Moreover, the risks for opting for in-person services was discussed. Kristina [name] verbally acknowledged understanding that by coming to the office, she is assuming the risk of exposure to the coronavirus or other public risk, and the risk may increase if Kristina [name] travels by public transportation, cab, or [employer]. To obtain in-person services, Kristina [name] verbally agreed to taking certain precautions (e.g., screening prior to appointment; universal masking; social distancing of 6 feet; proper hand hygiene; no visitors) set forth by [facility] to keep everyone safe from exposure, sickness, and possible death. This information was shared by front desk staff either at the time of  scheduling and/or during the check-in process. Kristina [name] expressed understanding that should she not adhere to these safeguards, it may result in starting/returning to a telepsychological service arrangement and/or the exploration of other options for treatment. Kristina [name] acknowledged understanding that [facility] will follow the protocol set forth by [facility] should a patient present with a fever or other symptoms or disclose recent exposure, which will include rescheduling the appointment. Furthermore, Kristina [name] acknowledged understanding that precautions may change if additional local, state or federal orders or guidelines are published. This provider also shared that if Kristina [name] tests positive for the coronavirus, this provider may be required to notify local health authorities that Kristina [name] was in the [facility] clinic. Only minimum information necessary for data collection will be disclosed. This provider will follow [facility]'s disclosure policy should this provider or staff test positive for the coronavirus. To avoid handling of paper/writing instruments and increasing likelihood of touching, verbal consent was obtained by Kristina [name] [name] during today's appointment prior to proceeding. Kristina [name] provided verbal consent to proceed, and acknowledged understanding that by verbally consenting to proceed, she is agreeable to all information noted above.   Informed Consent: The provider's role was explained to Kristina [name]. The provider reviewed and discussed issues of confidentiality, privacy, and limits therein (e.g., reporting obligations). In addition to verbal informed consent, written informed consent for psychological services was obtained from Kristina [name] prior to the initial intake interview. Written consent included information concerning the practice, financial arrangements, and confidentiality and patients' rights. Since the clinic is not a 24/7 crisis center, mental health emergency resources were  shared, and the provider explained MyChart, e-mail, voicemail, and/or other messaging systems should be utilized only for non-emergency reasons. This provider also explained that information obtained during appointments will be placed in Kristina [name]'s  medical record in a confidential manner and relevant information will be shared with other providers at [facility] that she meets with for coordination of care. Kristina [name] verbally acknowledged understanding of the aforementioned, and agreed to use mental health emergency resources discussed if needed. Moreover, Kristina [name] agreed information may be shared with other [facility] providers as needed for coordination of care. By signing the service agreement document, Kristina [name] provided written consent for coordination of care.   Chief Complaint/HPI: Kristina [name] was referred by Dr. Jearld [name]. During the initial appointment with Dr. Jearld [name] at [facility] on March 26, 2019, Kristina [name] reported experiencing the following: snacking frequently in the evenings, frequently drinking liquids with calories, frequently making poor food choices, frequently eating larger portions than normal , struggling with emotional eating, having problems with excessive hunger, needing chocolate after eating and cravings being her downfall.               During today's appointment, Kristina [name] reported "I have no will power." Kristina [name] was verbally administered a questionnaire assessing various behaviors related to emotional eating. Kristina [name] endorsed the following: overeat when you are celebrating, experience food cravings on a regular basis, eat certain foods when you are anxious, stressed, depressed, or your feelings are hurt, use food to help you cope with emotional situations, find food is comforting to you, overeat frequently when you are bored or lonely, overeat when you are alone, but eat much less when you are with other people and eat as a reward. She shared she craves the  following: chocolate and salty foods, such as chips. She believes the onset of emotional eating was likely in childhood; however, she started gaining weight 10 or 11 years ago. She described the frequency of emotional eating as "not very often." Moreover, Kristina [name] indicated boredom triggers emotional eating, whereas spending time with friend makes emotional eating better. Kristina [name] denied a history of restricting food intake, purging and engagement in other compensatory strategies, and has never been diagnosed with an eating disorder. She also denied a history of treatment for emotional eating. Furthermore, Kristina [name] denied other problems of concern.    Mental Status Examination:  Appearance: neat Behavior: cooperative Mood: euthymic Affect: mood congruent Speech: normal in rate, volume, and tone Eye Contact: appropriate Psychomotor Activity: appropriate Thought Process: linear, logical, and goal directed  Content/Perceptual Disturbances: denies suicidal and homicidal ideation, plan, and intent and no hallucinations, delusions, bizarre thinking or behavior reported or observed Orientation: time, person, place and purpose of appointment Cognition/Sensorium: memory, attention, language, and fund of knowledge intact  Insight: fair Judgment: fair  Family & Psychosocial History: Kristina [name] reported she is not in a relationship and does not have any children. She shared she married her best friend's brother. They divorced in 2002, but separated since 1992. She added, "We remained friends," but he is now deceased. She indicated she is currently retired. Prior to retirement, she was an Scientist, water quality. Additionally, Kristina [name] shared her highest level of education obtained is "some college." Currently, Kristina [name]'s social support system consists of her friends, sister, and brother. Moreover, Kristina [name] stated she resides alone.  Medical History:  Past Medical History:  Diagnosis Date   Anxiety    Arthritis    Back pain     Cancer ([place])    basal and squamous cell skin   Chicken pox    Chronic back pain    COPD (chronic obstructive pulmonary disease) (HCC)    Depression    Gallbladder problem  GERD (gastroesophageal reflux disease)    Hypertension    Hypothyroidism    Joint pain    Lactose intolerance    Migraines    Osteoarthritis    SOB (shortness of breath)    Thyroid disease    Tremor    Vitamin D deficiency    Past Surgical History:  Procedure Laterality Date   ABDOMINAL HYSTERECTOMY  1978   CATARACT EXTRACTION Left 12/20/2017   will have the right one completed a month later    CHOLECYSTECTOMY  2000   COLONOSCOPY  2007   JOINT REPLACEMENT Right 08/19/2017   great toe    UPPER GASTROINTESTINAL ENDOSCOPY  6073,7106   Current Outpatient Medications on File Prior to Visit  Medication Sig Dispense Refill   Acetaminophen (TYLENOL PO) Take 400 mg by mouth 2 (two) times daily.     albuterol (PROVENTIL HFA;VENTOLIN HFA) 108 (90 Base) MCG/ACT inhaler Inhale 2 puffs into the lungs every 6 (six) hours as needed for wheezing or shortness of breath. 1 Inhaler 2   ALPRAZolam (XANAX) 0.25 MG tablet TAKE 1 TABLET BY MOUTH AT BEDTIME AS NEEDED FOR ANXIETY AND FOR SLEEP 30 tablet 2   Azelaic Acid (FINACEA) 15 % cream Apply 1 application topically 2 (two) times daily. After skin is thoroughly washed and patted dry, gently but thoroughly massage a thin film of azelaic acid cream into the affected area twice daily, in the morning and evening.     Cholecalciferol (VITAMIN D3) 25 MCG (1000 UT) CAPS Take by mouth.     cyclobenzaprine (FLEXERIL) 10 MG tablet Take 1 tablet (10 mg total) by mouth daily. 90 tablet 1   diclofenac sodium (VOLTAREN) 1 % GEL Apply 3 gm to 3 large joints up to 3 times a day.Dispense 3 tubes with 3 refills. 4 Tube 3   DULoxetine (CYMBALTA) 60 MG capsule Take 1 capsule (60 mg total) by mouth daily. 90 capsule 2   estradiol (VIVELLE-DOT) 0.0375 MG/24HR APPLY 1  PATCH TOPICALLY TWICE A WEEK  8   levothyroxine (SYNTHROID) 25 MCG tablet Take 1 tablet (25 mcg total) by mouth daily before breakfast. 90 tablet 3   losartan (COZAAR) 50 MG tablet Take 1 tablet (50 mg total) by mouth daily. 90 tablet 2   Magnesium 250 MG TABS Take by mouth daily.     meclizine (ANTIVERT) 12.5 MG tablet Take 1 tablet (12.5 mg total) by mouth 3 (three) times daily as needed for dizziness. 60 tablet 1   Multiple Vitamin (MULTIVITAMIN) tablet Take 1 tablet by mouth daily.     pantoprazole (PROTONIX) 40 MG tablet Take 1 tablet (40 mg total) by mouth daily. 90 tablet 3   propranolol (INDERAL) 40 MG tablet Take 1 tablet (40 mg total) by mouth 2 (two) times daily. 180 tablet 2   Propylene Glycol (SYSTANE BALANCE OP) Apply to eye.     vitamin B-12 (CYANOCOBALAMIN) 1000 MCG tablet Take 1,000 mcg by mouth once a week.     No current facility-administered medications on file prior to visit.   Paisyn denied a history of head injuries and loss of consciousness.   Mental Health History: Charlotta reported she attended individual therapy for approximately 4 visits in the late 1980s for anxiety. Kristina [name] denied a history of hospitalizations for psychiatric concerns, and has never met with a psychiatrist. Kristina [name] stated she is prescribed Xanax and Cymbalta by her PCP. She noted Cymbalta is "mainly for arthritis." Kristina [name] denied a family history of mental health related concerns. Kristina [name] [name]  denied a trauma history, including psychological, physical  and sexual abuse, as well as neglect.   Nasiyah described her typical mood as "pretty good, but now is total boredom." She explained she moved form [place] in May of 2017 and does not have "many friends here." Aside from concerns noted above and endorsed on the PHQ-9 and GAD-7, Judie reported experiencing decreased motivation; worry about her sister; and decreased self-esteem due to weight. She shared previously experiencing hopelessness due to being on her own. She  also shared a history of panic attacks and noted her last panic attack was in 2017 while driving from [place]. Caralynn endorsed current alcohol use. More specifically, she shared, having an "occassional glass of wine," which is once every 3-4 months. She denied tobacco use. She denied illicit/recreational substance use. Regarding caffeine intake, Sherrine reported consuming 2 cups of tea daily. Furthermore, Jayelyn denied experiencing the following: memory concerns, hallucinations and delusions, paranoia, symptoms of mania (e.g., expansive mood, flighty ideas, decreased need for sleep, engagement in risky behaviors) and crying spells. She also denied current suicidal ideation, plan, and intent; history of and current homicidal ideation, plan, and intent; and history of and current engagement in self-harm.  Mora described a history of experiencing passive suicidal ideation (I.e., "No body would care if I wasn't here."). She noted the first time she experienced the aforementioned was in 2009 after she was fired and noted it was for approximately one year. She again experienced it in 2016 when she retired. She denied a history of suicidal plan and intent. She also denied a history of suicidal gestures and attempts. She noted, "Back then everyone I knew was working. My mental therapy was walking on the beach every morning and that helped a lot." When asked about when she last experienced suicidal ideation, she noted, "Probably 2009." This provider reflected that she previously noted 2016. As such, she noted, "I don't know if that was not caring or if I wasn't happy."    The following protective factors were identified for Rosalita: cat, best friend, and best friend's family. If she were to become overwhelmed in the future, which is a sign that a crisis may occur, she identified the following coping skills she could engage in: talk to friends and family, go for a walk, camp, and watch television (e.g., dancing shows). She has  also thought about going to church again. It was recommended the aforementioned be written down and developed into a coping card for future reference; she was observed writing. Psychoeducation regarding the importance of reaching out to a trusted individual and/or utilizing emergency resources if there is a change in emotional status and/or there is an inability to ensure safety was provided. Hanaa's confidence in reaching out to a trusted individual and/or utilizing emergency resources should there be an intensification in emotional status and/or there is an inability to ensure safety was assessed on a scale of one to ten where one is not confident and ten is extremely confident. She reported her confidence is a 10. Additionally, Marigold denied current access to firearms and/or weapons.   The following strengths were reported by Kristina [name] [name]: pretty friendly person, good friend, and helpful. The following strengths were observed by this provider: ability to express thoughts and feelings during the therapeutic session, ability to establish and benefit from a therapeutic relationship, ability to learn and practice coping skills, willingness to work toward established goal(s) with the clinic and ability to engage in reciprocal conversation.  Legal History: Dona denied a  history of legal involvement.   Structured Assessment Results: The Patient Health Questionnaire-9 (PHQ-9) is a self-report measure that assesses symptoms and severity of depression over the course of the last two weeks. Rockelle obtained a score of 7 suggesting mild depression. Petula finds the endorsed symptoms to be not difficult at all. Little interest or pleasure in doing things 0  Feeling down, depressed, or hopeless 0  Trouble falling or staying asleep, or sleeping too much 3  Feeling tired or having little energy 3  Poor appetite or overeating 0  Feeling bad about yourself --- or that you are a failure or have let yourself or your family down 0    Trouble concentrating on things, such as reading the newspaper or watching television 0  Moving or speaking so slowly that other people could have noticed? Or the opposite --- being so fidgety or restless that you have been moving around a lot more than usual 1  Thoughts that you would be better off dead or hurting yourself in some way 0  PHQ-9 Score 7    The Generalized Anxiety Disorder-7 (GAD-7) is a brief self-report measure that assesses symptoms of anxiety over the course of the last two weeks. Tarry obtained a score of 2 suggesting minimal anxiety. Kaelene finds the endorsed symptoms to be not difficult at all. Feeling nervous, anxious, on edge 1  Not being able to stop or control worrying 0  Worrying too much about different things 0  Trouble relaxing 0  Being so restless that it's hard to sit still 0  Becoming easily annoyed or irritable 1  Feeling afraid as if something awful might happen 0  GAD-7 Score 2   Interventions: A chart review was conducted prior to the clinical intake interview. The PHQ-9, and GAD-7 were verbally administered and a clinical intake interview was completed. In addition,  a Mood and Food questionnaire to assess various behaviors related to emotional eating was verbally administered. Throughout session, empathic reflections and validation was provided. Continuing treatment with this provider was discussed and a treatment goal was established. Psychoeducation regarding emotional versus physical hunger was provided. Elizette was given a handout to utilize between now and the next appointment to increase awareness of hunger patterns and subsequent eating. She was also provided a handout with emergency resources.   Provisional DSM-5 Diagnosis: 311 (F32.8) Other Specified Depressive Disorder, Emotional Eating Behaviors  Plan: Ethelda appears able and willing to participate as evidenced by collaboration on a treatment goal, engagement in reciprocal conversation, and asking  questions as needed for clarification. The next appointment will be scheduled in three weeks, which will be via [employer]. The following treatment goal was established: decrease emotional eating. For the aforementioned goal, Keyleigh can benefit from individual therapy sessions that are brief in duration for approximately four to six sessions. The treatment modality will be individual therapeutic services, including an eclectic therapeutic approach utilizing techniques from Cognitive Behavioral Therapy, Patient Centered Therapy, Dialectical Behavior Therapy, Acceptance and Commitment Therapy, Interpersonal Therapy, and Cognitive Restructuring. Therapeutic approach will include various interventions as appropriate, such as validation, support, mindfulness, thought defusion, reframing, psychoeducation, values assessment, and role playing. This provider will regularly review the treatment plan and medical chart to keep informed of status changes. Barri expressed understanding and agreement with the initial treatment plan of care.

## 2019-04-08 ENCOUNTER — Other Ambulatory Visit: Payer: Self-pay | Admitting: Family Medicine

## 2019-04-08 DIAGNOSIS — G4700 Insomnia, unspecified: Secondary | ICD-10-CM

## 2019-04-09 ENCOUNTER — Encounter (INDEPENDENT_AMBULATORY_CARE_PROVIDER_SITE_OTHER): Payer: Self-pay | Admitting: Bariatrics

## 2019-04-09 ENCOUNTER — Ambulatory Visit (INDEPENDENT_AMBULATORY_CARE_PROVIDER_SITE_OTHER): Payer: PPO | Admitting: Bariatrics

## 2019-04-09 ENCOUNTER — Other Ambulatory Visit: Payer: Self-pay

## 2019-04-09 VITALS — BP 122/71 | HR 73 | Temp 98.3°F | Ht 62.0 in | Wt 175.0 lb

## 2019-04-09 DIAGNOSIS — E669 Obesity, unspecified: Secondary | ICD-10-CM

## 2019-04-09 DIAGNOSIS — E7800 Pure hypercholesterolemia, unspecified: Secondary | ICD-10-CM | POA: Diagnosis not present

## 2019-04-09 DIAGNOSIS — I10 Essential (primary) hypertension: Secondary | ICD-10-CM | POA: Diagnosis not present

## 2019-04-09 DIAGNOSIS — E8881 Metabolic syndrome: Secondary | ICD-10-CM

## 2019-04-09 DIAGNOSIS — Z6832 Body mass index (BMI) 32.0-32.9, adult: Secondary | ICD-10-CM

## 2019-04-09 DIAGNOSIS — E559 Vitamin D deficiency, unspecified: Secondary | ICD-10-CM | POA: Diagnosis not present

## 2019-04-09 MED ORDER — VITAMIN D (ERGOCALCIFEROL) 1.25 MG (50000 UNIT) PO CAPS: 50000.0000 [IU] | ORAL_CAPSULE | ORAL | 0 refills | Status: DC

## 2019-04-10 ENCOUNTER — Encounter (INDEPENDENT_AMBULATORY_CARE_PROVIDER_SITE_OTHER): Payer: Self-pay | Admitting: Bariatrics

## 2019-04-10 NOTE — Progress Notes (Signed)
Office: [phone]  /  Fax: [phone]   HPI:   Chief Complaint: OBESITY Kristina [name] is here to discuss her progress with her obesity treatment plan. She is on the Category 1 plan and is following her eating plan approximately 80 % of the time. She states she is biking 10 to 25 minutes 4 times per week. Kristina [name] is down 4 pounds. She still has cravings for chocolate. Kristina [name] has only been on the plan for about 1 1/2 weeks. She is not hungry. Her weight is 175 lb (79.4 kg) today and has had a weight loss of 4 pounds over a period of 2 weeks since her last visit. She has lost 4 lbs since starting treatment with [place].  Insulin Resistance Kristina [name] has a diagnosis of insulin resistance based on her elevated fasting insulin level >5. Although Kristina [name]'s blood glucose readings are still under good control, insulin resistance puts her at greater risk of metabolic syndrome and diabetes. Her last A1c was at 5.6 and last insulin level was at 11.6 She is not taking metformin currently and continues to work on diet and exercise to decrease risk of diabetes. Kristina [name] denies polyphagia.  Vitamin D deficiency Kristina [name] has a diagnosis of vitamin D deficiency. Kristina [name] is on OTC vit D @2 ,500 units daily and her last vitamin D level was at 39.1. Kristina [name] denies nausea, vomiting or muscle weakness.  Hypertension Kristina [name] is a 68 y.o. female with hypertension. Her blood pressure is 122/71 and is well controlled. Kristina [name] denies chest pain or shortness of breath on exertion. She is working weight loss to help control her blood pressure with the goal of decreasing her risk of heart attack and stroke.   Elevated Cholesterol Kristina [name] has elevated cholesterol. Her total cholesterol is 256 and LDL is 184. She has no previous elevation. She has been trying to improve her cholesterol levels with intensive lifestyle modification including a low saturated fat diet, exercise and weight loss. She denies myalgias.  ASSESSMENT AND  PLAN:  Insulin resistance  Vitamin D deficiency - Plan: Vitamin D, Ergocalciferol, (DRISDOL) 1.25 MG (50000 UT) CAPS capsule  Essential hypertension  Elevated cholesterol  Class 1 obesity with serious comorbidity and body mass index (BMI) of 32.0 to 32.9 in adult, unspecified obesity type  PLAN:  Insulin Resistance Kristina [name] will continue to work on weight loss, exercise, decreasing saturated fats and decreasing simple carbohydrates in her diet to help decrease the risk of diabetes. She was informed that eating too many simple carbohydrates or too many calories at one sitting increases the likelihood of GI side effects.  Kristina [name] agreed to follow up with [place] as directed to monitor her progress.  Vitamin D Deficiency Kristina [name] was informed that low vitamin D levels contributes to fatigue and are associated with obesity, breast, and colon cancer. She agrees to take prescription Vit D @50 ,000 IU every week #4 with no refills and will follow up for routine testing of vitamin D, at least 2-3 times per year. She was informed of the risk of over-replacement of vitamin D and agrees to not increase her dose unless she discusses this with [place] first. Kristina [name] agrees to follow up with our clinic in 2 weeks.  Hypertension We discussed sodium restriction, working on healthy weight loss, and a regular exercise program as the means to achieve improved blood pressure control. Kristina [name] agreed with this plan and agreed to follow up as directed. We will continue to monitor her blood pressure as well as her progress with the  above lifestyle modifications. She will continue her medications as prescribed and will watch for signs of hypotension as she continues her lifestyle modifications.  Elevated Cholesterol Kristina [name] was informed of the American Heart Association Guidelines emphasizing intensive lifestyle modifications as the first line treatment for hyperlipidemia. We discussed many lifestyle modifications today in depth, and Kristina [name]  will continue to work on decreasing saturated fats such as fatty red meat, butter and many fried foods. She will also increase vegetables and lean protein in her diet and continue to work on exercise and weight loss efforts. We will recheck labs in 3 months and Kristina [name] will follow up as directed.  Obesity Kristina [name] is currently in the action stage of change. As such, her goal is to continue with weight loss efforts She has agreed to follow the Category 1 plan Kristina [name] will continue doing biking and she will increase activity, with small weights and small bands for weight loss and overall health benefits. We discussed the following Behavioral Modification Strategies today: increase H2O intake, no skipping meals, keeping healthy foods in the home, increasing lean protein intake, decreasing simple carbohydrates, increasing vegetables, decrease eating out, work on meal planning and intentional eating, dealing with family or coworker sabotage, travel eating strategies, celebration eating strategies and holiday eating strategies   Kristina [name] has agreed to follow up with our clinic in 2 weeks. She was informed of the importance of frequent follow up visits to maximize her success with intensive lifestyle modifications for her multiple health conditions.  ALLERGIES: Allergies  Allergen Reactions   Meloxicam Other (See Comments)    "skin infection on leg"   Sulfa Antibiotics Rash    Rash all over body/fever   Hydrocodone Nausea Only    Per patient     MEDICATIONS: Current Outpatient Medications on File Prior to Visit  Medication Sig Dispense Refill   Acetaminophen (TYLENOL PO) Take 400 mg by mouth 2 (two) times daily.     albuterol (PROVENTIL HFA;VENTOLIN HFA) 108 (90 Base) MCG/ACT inhaler Inhale 2 puffs into the lungs every 6 (six) hours as needed for wheezing or shortness of breath. 1 Inhaler 2   ALPRAZolam (XANAX) 0.25 MG tablet TAKE 1 TABLET BY MOUTH AT BEDTIME AS NEEDED FOR ANXIETY AND FOR SLEEP 30  tablet 2   Azelaic Acid (FINACEA) 15 % cream Apply 1 application topically 2 (two) times daily. After skin is thoroughly washed and patted dry, gently but thoroughly massage a thin film of azelaic acid cream into the affected area twice daily, in the morning and evening.     Cholecalciferol (VITAMIN D3) 25 MCG (1000 UT) CAPS Take by mouth.     cyclobenzaprine (FLEXERIL) 10 MG tablet Take 1 tablet (10 mg total) by mouth daily. 90 tablet 1   diclofenac sodium (VOLTAREN) 1 % GEL Apply 3 gm to 3 large joints up to 3 times a day.Dispense 3 tubes with 3 refills. 4 Tube 3   DULoxetine (CYMBALTA) 60 MG capsule Take 1 capsule (60 mg total) by mouth daily. 90 capsule 2   estradiol (VIVELLE-DOT) 0.0375 MG/24HR APPLY 1 PATCH TOPICALLY TWICE A WEEK  8   levothyroxine (SYNTHROID) 25 MCG tablet Take 1 tablet (25 mcg total) by mouth daily before breakfast. 90 tablet 3   losartan (COZAAR) 50 MG tablet Take 1 tablet (50 mg total) by mouth daily. 90 tablet 2   Magnesium 250 MG TABS Take by mouth daily.     meclizine (ANTIVERT) 12.5 MG tablet Take 1 tablet (12.5 mg total) by  mouth 3 (three) times daily as needed for dizziness. 60 tablet 1   Multiple Vitamin (MULTIVITAMIN) tablet Take 1 tablet by mouth daily.     pantoprazole (PROTONIX) 40 MG tablet Take 1 tablet (40 mg total) by mouth daily. 90 tablet 3   propranolol (INDERAL) 40 MG tablet Take 1 tablet (40 mg total) by mouth 2 (two) times daily. 180 tablet 2   Propylene Glycol (SYSTANE BALANCE OP) Apply to eye.     vitamin B-12 (CYANOCOBALAMIN) 1000 MCG tablet Take 1,000 mcg by mouth once a week.     No current facility-administered medications on file prior to visit.     PAST MEDICAL HISTORY: Past Medical History:  Diagnosis Date   Anxiety    Arthritis    Back pain    Cancer (HCC)    basal and squamous cell skin   Chicken pox    Chronic back pain    COPD (chronic obstructive pulmonary disease) (HCC)    Depression    Gallbladder  problem    GERD (gastroesophageal reflux disease)    Hypertension    Hypothyroidism    Joint pain    Lactose intolerance    Migraines    Osteoarthritis    SOB (shortness of breath)    Thyroid disease    Tremor    Vitamin D deficiency     PAST SURGICAL HISTORY: Past Surgical History:  Procedure Laterality Date   ABDOMINAL HYSTERECTOMY  1978   CATARACT EXTRACTION Left 12/20/2017   will have the right one completed a month later    CHOLECYSTECTOMY  2000   COLONOSCOPY  2007   JOINT REPLACEMENT Right 08/19/2017   great toe    UPPER GASTROINTESTINAL ENDOSCOPY  [id number]    SOCIAL HISTORY: Social History   Tobacco Use   Smoking status: Former Smoker    Packs/day: 1.50    Years: 25.00    Pack years: 37.50    Quit date: 03/03/1990    Years since quitting: 29.1   Smokeless tobacco: Never Used  Substance Use Topics   Alcohol use: Yes    Comment: glass of wine once a month   Drug use: No    FAMILY HISTORY: Family History  Problem Relation Age of Onset   Arthritis Mother    Diabetes Mother    COPD Mother    Heart failure Mother    Stroke Father    Hypertension Father    Hyperlipidemia Father    Diabetes Father    CAD Father    Heart disease Father    Heart failure Father    Breast cancer Maternal Aunt    Cancer Sister        bladder cancer    Throat cancer Brother    Colon cancer Neg Hx    Stomach cancer Neg Hx    Esophageal cancer Neg Hx     ROS: Review of Systems  Constitutional: Positive for weight loss.  Cardiovascular: Negative for chest pain.  Gastrointestinal: Negative for nausea and vomiting.  Musculoskeletal: Negative for myalgias.       Negative for muscle weakness  Endo/Heme/Allergies:       Negative for polyphagia Positive for cravings    PHYSICAL EXAM: Blood pressure 122/71, pulse 73, temperature 98.3 F (36.8 C), temperature source Oral, height 5\' 2"  (1.575 m), weight 175 lb (79.4 kg), SpO2 96  %. Body mass index is 32.01 kg/m. Physical Exam Vitals signs reviewed.  Constitutional:      Appearance: Normal appearance. She  is well-developed. She is obese.  Cardiovascular:     Rate and Rhythm: Normal rate.  Pulmonary:     Effort: Pulmonary effort is normal.  Musculoskeletal: Normal range of motion.  Skin:    General: Skin is warm and dry.  Neurological:     Mental Status: She is alert and oriented to person, place, and time.  Psychiatric:        Mood and Affect: Mood normal.        Behavior: Behavior normal.     RECENT LABS AND TESTS: BMET    Component Value Date/Time   NA 135 03/26/2019 1058   K 4.3 03/26/2019 1058   CL 100 03/26/2019 1058   CO2 21 03/26/2019 1058   GLUCOSE 93 03/26/2019 1058   GLUCOSE 101 (H) 05/19/2018 1044   BUN 10 03/26/2019 1058   CREATININE 0.89 03/26/2019 1058   CREATININE 0.91 09/09/2017 0959   CALCIUM 8.9 03/26/2019 1058   GFRNONAA 67 03/26/2019 1058   GFRNONAA 65 09/09/2017 0959   GFRAA 77 03/26/2019 1058   GFRAA 76 09/09/2017 0959   Lab Results  Component Value Date   HGBA1C 5.6 03/26/2019   HGBA1C 5.7 05/19/2018   Lab Results  Component Value Date   INSULIN 11.6 03/26/2019   CBC    Component Value Date/Time   WBC 11.6 (H) 01/18/2018 1400   RBC 4.69 01/18/2018 1400   HGB 14.6 01/18/2018 1400   HCT 42.8 01/18/2018 1400   PLT 308 01/18/2018 1400   MCV 91.3 01/18/2018 1400   MCH 31.1 01/18/2018 1400   MCHC 34.1 01/18/2018 1400   RDW 13.1 01/18/2018 1400   LYMPHSABS 1.0 01/18/2018 1400   MONOABS 0.5 01/18/2018 1400   EOSABS 0.1 01/18/2018 1400   BASOSABS 0.0 01/18/2018 1400   Iron/TIBC/Ferritin/ %Sat No results found for: IRON, TIBC, FERRITIN, IRONPCTSAT Lipid Panel     Component Value Date/Time   CHOL 256 (H) 03/26/2019 1058   TRIG 141 03/26/2019 1058   HDL 44 03/26/2019 1058   CHOLHDL 4.2 08/04/2017 0830   LDLCALC 184 (H) 03/26/2019 1058   Hepatic Function Panel     Component Value Date/Time   PROT 6.5  03/26/2019 1058   ALBUMIN 3.9 03/26/2019 1058   AST 23 03/26/2019 1058   ALT 22 03/26/2019 1058   ALKPHOS 81 03/26/2019 1058   BILITOT 0.3 03/26/2019 1058      Component Value Date/Time   TSH 2.170 03/26/2019 1058   TSH 1.86 05/19/2018 1044   TSH 5.040 (H) 08/04/2017 0830      OBESITY BEHAVIORAL INTERVENTION VISIT  Today's visit was # 2   Starting weight: 179 lbs Starting date: 03/26/2019 Today's weight : 175 lbs Today's date: 04/09/2019 Total lbs lost to date: 4   ASK: We discussed the diagnosis of obesity with Kristina [name] today and Kristina [name] agreed to give [place] permission to discuss obesity behavioral modification therapy today.  ASSESS: Rwan has the diagnosis of obesity and her BMI today is 61 Gracelynne is in the action stage of change   ADVISE: Samoni was educated on the multiple health risks of obesity as well as the benefit of weight loss to improve her health. She was advised of the need for long term treatment and the importance of lifestyle modifications to improve her current health and to decrease her risk of future health problems.  AGREE: Multiple dietary modification options and treatment options were discussed and  Jaleyza agreed to follow the recommendations documented in the above note.  ARRANGE: Corbin was educated on the importance of frequent visits to treat obesity as outlined per CMS and USPSTF guidelines and agreed to schedule her next follow up appointment today.  Corey Skains, am acting as Location manager for [employer]. Owens Shark, DO  I have reviewed the above documentation for accuracy and completeness, and I agree with the above. -Jearld Lesch, DO

## 2019-04-11 ENCOUNTER — Encounter (INDEPENDENT_AMBULATORY_CARE_PROVIDER_SITE_OTHER): Payer: Self-pay | Admitting: Bariatrics

## 2019-04-11 ENCOUNTER — Other Ambulatory Visit (INDEPENDENT_AMBULATORY_CARE_PROVIDER_SITE_OTHER): Payer: Self-pay | Admitting: Bariatrics

## 2019-04-11 DIAGNOSIS — E559 Vitamin D deficiency, unspecified: Secondary | ICD-10-CM

## 2019-04-11 NOTE — Telephone Encounter (Signed)
Please review

## 2019-04-17 NOTE — Progress Notes (Signed)
Office: [phone]  /  Fax: [phone]    Date: April 23, 2019    Appointment Start Time: 2:28pm Duration: 37 minutes Provider: Glennie Isle, Psy.D. Type of Session: Intake for Individual Therapy  Location of Patient: Home Location of Provider: Provider's Home Type of Contact: Telepsychological Visit via [place] Audio Call  Informed Consent for Telepsychological Services: Prior to initiating telepsychological services, Kristina [name] was provided with an informed consent document, which included the development of a safety plan (i.e., an emergency contact and emergency resources) in the event of an emergency/crisis. Kristina [name] expressed understanding of the rationale of the safety plan and provided consent for this provider to reach out to her emergency contact in the event of an emergency/crisis. Kristina [name] returned the completed consent form prior to today's appointment. This provider verbally reviewed the consent form during today's appointment prior to proceeding with the appointment. Kristina [name] verbally acknowledged understanding that she is ultimately responsible for understanding her insurance benefits as it relates to reimbursement of telepsychological and in-person services. This provider also reviewed confidentiality, as it relates to telepsychological services, as well as the rationale for telepsychological services. More specifically, this provider's clinic is limiting in-person visits due to COVID-19. [name] expressed understanding regarding the rationale for telepsychological services. In addition, this provider explained the telepsychological services informed consent document would be considered an addendum to the initial consent document/service agreement. Kelana verbally consented to proceed.   Session Content: Kristina [name] is a 68 y.o. female presenting via [place] for a follow-up appointment to address the previously established treatment goal of decreasing emotional eating. Today's  appointment was a telepsychological visit, as this provider's clinic is seeing a limited number of patients for in-person visits due to COVID-19. Therapeutic services will resume to in-person appointments once deemed appropriate. Kristina [name] expressed understanding regarding the rationale for telepsychological services, and provided verbal consent for today's appointment. Prior to proceeding with today's appointment, Kristina [name]'s physical location at the time of this appointment was obtained. Sible reported she was at home and provided the address. In the event of technical difficulties, Breella shared a phone number she could be reached at. Maretha and this provider participated in today's telepsychological service. Also, Lorella denied anyone else being present in the room or on the WebEx appointment. Of note, this provider called Amiyah at 2:32pm as connection was lost on Kristina [name]'s end. Assistance was provided to re-connect. The appointment resumed via Webex at 2:35pm. However, at 2:40pm this provider called Markyla as there was an issue with audio capabilities via Webex. Remainder of the appointment progressed with Webex being utilized for video capabilities and a regular telephone call was used for audio capabilities. This provider explained only a land line to land line call is a secure form of connection for an audio call. Kristina [name] acknowledged understanding and provided verbal consent to proceed with this provider using a cell phone and Kristina [name] using a land line.   This provider conducted a brief check-in and verbally administered the PHQ-9 and GAD-7. [name] reported an increase in physical activity, including walking and biking. She discussed reviewing the handout previously shared with [name] and noted a belief she is not experiencing emotional hunger. However, she noted she is eating due to boredom and out of habit. This provider reflected the aforementioned are triggers for emotional eating. In addition, Kristina [name] discussed worry  about her brother in law, as he was diagnosed with multiple medical concerns. Validation was provided.  Moreover, psychoeducation regarding triggers for emotional eating was provided. [name]  was provided a handout, and encouraged to utilize the handout between now and the next appointment to increase awareness of triggers and frequency. Kristina [name] agreed. This provider also discussed behavioral strategies for specific triggers, such as placing the utensil down when conversing to avoid mindless eating. Kristina [name] provided verbal consent during today's appointment for this provider to send the handout for triggers via e-mail. Kristina [name] noted, "I got it pretty much under control." She further described planning her meals ahead of time. This was positively reinforced. Kristina [name] was receptive to today's session as evidenced by openness to sharing, responsiveness to feedback, and willingness to explore her triggers for emotional eating.  Mental Status Examination:  Appearance: neat Behavior: cooperative Mood: euthymic Affect: mood congruent Speech: normal in rate, volume, and tone Eye Contact: appropriate Psychomotor Activity: appropriate Thought Process: linear, logical, and goal directed  Content/Perceptual Disturbances: denies suicidal and homicidal ideation, plan, and intent and no hallucinations, delusions, bizarre thinking or behavior reported or observed Orientation: time, person, place and purpose of appointment Cognition/Sensorium: memory, attention, language, and fund of knowledge intact  Insight: good Judgment: good  Structured Assessment Results: The Patient Health Questionnaire-9 (PHQ-9) is a self-report measure that assesses symptoms and severity of depression over the course of the last two weeks. Kristina [name] obtained a score of 0. Little interest or pleasure in doing things 0  Feeling down, depressed, or hopeless 0  Trouble falling or staying asleep, or sleeping too much 0  Feeling tired or having little  energy 0  Poor appetite or overeating 0  Feeling bad about yourself --- or that you are a failure or have let yourself or your family down 0  Trouble concentrating on things, such as reading the newspaper or watching television 0  Moving or speaking so slowly that other people could have noticed? Or the opposite --- being so fidgety or restless that you have been moving around a lot more than usual 0  Thoughts that you would be better off dead or hurting yourself in some way 0  PHQ-9 Score 0    The Generalized Anxiety Disorder-7 (GAD-7) is a brief self-report measure that assesses symptoms of anxiety over the course of the last two weeks. Jeweliana obtained a score of 0. Feeling nervous, anxious, on edge 0  Not being able to stop or control worrying 0  Worrying too much about different things 0  Trouble relaxing 0  Being so restless that it's hard to sit still 0  Becoming easily annoyed or irritable 0  Feeling afraid as if something awful might happen 0  GAD-7 Score 0   Interventions:  Conducted a brief chart review Verbal administration of PHQ-9 and GAD-7 for symptom monitoring Provided empathic reflections and validation Reviewed content from the previous session Psychoeducation provided regarding triggers for emotional eating Provided positive reinforcement Focused on rapport building Employed supportive psychotherapy interventions to facilitate reduced distress, and to improve coping skills with identified stressors  DSM-5 Diagnosis: 311 (F32.8) Other Specified Depressive Disorder, Emotional Eating Behaviors  Treatment Goal & Progress: During the initial appointment with this provider, the following treatment goal was established: decrease emotional eating. Boyd has demonstrated some progress in her goal as evidenced by increased awareness of hunger patterns.   Plan: Nakhiya continues to appear able and willing to participate as evidenced by engagement in reciprocal conversation, and  asking questions for clarification as appropriate. Per Tanja's self-report regarding progress, the next appointment will be scheduled in one month, as she was receptive to an additional follow-up  appointment prior to termination with this provider. The appointment will be via [employer]. The next session focus on reviewing triggers for emotional eating and working toward the established treatment goal. Termination will be discussed if deemed appropriate.

## 2019-04-23 ENCOUNTER — Other Ambulatory Visit: Payer: Self-pay

## 2019-04-23 ENCOUNTER — Ambulatory Visit (INDEPENDENT_AMBULATORY_CARE_PROVIDER_SITE_OTHER): Payer: PPO | Admitting: Psychology

## 2019-04-23 DIAGNOSIS — F3289 Other specified depressive episodes: Secondary | ICD-10-CM

## 2019-04-26 ENCOUNTER — Encounter (INDEPENDENT_AMBULATORY_CARE_PROVIDER_SITE_OTHER): Payer: Self-pay | Admitting: Bariatrics

## 2019-04-26 ENCOUNTER — Other Ambulatory Visit: Payer: Self-pay

## 2019-04-26 ENCOUNTER — Ambulatory Visit (INDEPENDENT_AMBULATORY_CARE_PROVIDER_SITE_OTHER): Payer: PPO | Admitting: Bariatrics

## 2019-04-26 VITALS — BP 104/59 | HR 65 | Temp 98.3°F | Ht 62.0 in | Wt 172.0 lb

## 2019-04-26 DIAGNOSIS — E8881 Metabolic syndrome: Secondary | ICD-10-CM

## 2019-04-26 DIAGNOSIS — Z9189 Other specified personal risk factors, not elsewhere classified: Secondary | ICD-10-CM | POA: Diagnosis not present

## 2019-04-26 DIAGNOSIS — E559 Vitamin D deficiency, unspecified: Secondary | ICD-10-CM | POA: Diagnosis not present

## 2019-04-26 DIAGNOSIS — E6609 Other obesity due to excess calories: Secondary | ICD-10-CM

## 2019-04-26 DIAGNOSIS — Z6831 Body mass index (BMI) 31.0-31.9, adult: Secondary | ICD-10-CM | POA: Diagnosis not present

## 2019-04-26 MED ORDER — VITAMIN D (ERGOCALCIFEROL) 1.25 MG (50000 UNIT) PO CAPS: 50000.0000 [IU] | ORAL_CAPSULE | ORAL | 0 refills | Status: DC

## 2019-05-01 ENCOUNTER — Encounter (INDEPENDENT_AMBULATORY_CARE_PROVIDER_SITE_OTHER): Payer: Self-pay | Admitting: Bariatrics

## 2019-05-01 NOTE — Progress Notes (Signed)
Office: [phone]  /  Fax: [phone]   HPI:   Chief Complaint: OBESITY Kristina [name] is here to discuss her progress with her obesity treatment plan. She is on the Category 1 plan and is following her eating plan approximately 100% of the time. She states she is walking 60 minutes 5 times per week and biking 20 minutes 5 times per week. [name] is down 3 lbs. She reports she is getting adequate water and states that her protein is adequate. Her weight is 172 lb (78 kg) today and has had a weight loss of 3 pounds over a period of 2 weeks since her last visit. She has lost 7 lbs since starting treatment with [place].  Vitamin D deficiency Kristina [name] has a diagnosis of Vitamin D deficiency. She is currently taking prescription Vit D and denies nausea, vomiting or muscle weakness.  At risk for osteopenia and osteoporosis Kristina [name] is at higher risk of osteopenia and osteoporosis due to Vitamin D deficiency.   Insulin Resistance Kristina [name] has a diagnosis of insulin resistance based on her elevated fasting insulin level >5. Last A1c 5.6 on 03/26/2019 with an insulin of 11.6. Although Kristina [name]'s blood glucose readings are still under good control, insulin resistance puts her at greater risk of metabolic syndrome and diabetes. She is not taking metformin currently and continues to work on diet and exercise to decrease risk of diabetes. No polyphagia.  ASSESSMENT AND PLAN:  Vitamin D deficiency - Plan: Vitamin D, Ergocalciferol, (DRISDOL) 1.25 MG (50000 UT) CAPS capsule  Insulin resistance  At risk for osteoporosis  Class 1 obesity due to excess calories with body mass index (BMI) of 31.0 to 31.9 in adult, unspecified whether serious comorbidity present  PLAN:  Vitamin D Deficiency Kristina [name] was informed that low Vitamin D levels contributes to fatigue and are associated with obesity, breast, and colon cancer. She agrees to change her dose of prescription Vit D to 50,000 IU every week #12 with 0 refills and will  follow-up for routine testing of Vitamin D, at least 2-3 times per year. She was informed of the risk of over-replacement of Vitamin D and agrees to not increase her dose unless she discusses this with [place] first. Kristina [name] agrees to follow-up with our clinic in 2 weeks.  At risk for osteopenia and osteoporosis Kristina [name] was given extended  (15 minutes) osteoporosis prevention counseling today. Kristina [name] is at risk for osteopenia and osteoporosis due to her Vitamin D deficiency. She was encouraged to take her Vitamin D and follow her higher calcium diet and increase strengthening exercise to help strengthen her bones and decrease her risk of osteopenia and osteoporosis.  Insulin Resistance Kristina [name] will continue to work on weight loss, exercise, and decreasing simple carbohydrates in her diet to help decrease the risk of diabetes. We dicussed metformin including benefits and risks. She was informed that eating too many simple carbohydrates or too many calories at one sitting increases the likelihood of GI side effects. Kristina [name] was instructed to decrease carbohydrates and increase protein. She will follow-up with [place] as directed to monitor her progress.  Obesity Kristina [name] is currently in the action stage of change. As such, her goal is to continue with weight loss efforts. She has agreed to follow the Category 1 plan and journal 300-400 calories and 35+ grams of protein at supper. Kristina [name] will work on meal planning and intentional eating. She was given Category 1 and Category 2 plans along with additional lunch options. Kristina [name] has been instructed to continue to walk  and bike (8,000 steps) for weight loss and overall health benefits. We discussed the following Behavioral Modification Strategies today: increasing lean protein intake, decreasing simple carbohydrates, increasing vegetables, increase H20 intake, decrease eating out, no skipping meals, work on meal planning and easy cooking plans, and keeping healthy foods in the  home.  Kristina [name] has agreed to follow-up with our clinic in 2 weeks. She was informed of the importance of frequent follow-up visits to maximize her success with intensive lifestyle modifications for her multiple health conditions.  ALLERGIES: Allergies  Allergen Reactions  . Meloxicam Other (See Comments)    "skin infection on leg"  . Sulfa Antibiotics Rash    Rash all over body/fever  . Hydrocodone Nausea Only    Per patient     MEDICATIONS: Current Outpatient Medications on File Prior to Visit  Medication Sig Dispense Refill  . Acetaminophen (TYLENOL PO) Take 400 mg by mouth 2 (two) times daily.    Marland Kitchen albuterol (PROVENTIL HFA;VENTOLIN HFA) 108 (90 Base) MCG/ACT inhaler Inhale 2 puffs into the lungs every 6 (six) hours as needed for wheezing or shortness of breath. 1 Inhaler 2  . ALPRAZolam (XANAX) 0.25 MG tablet TAKE 1 TABLET BY MOUTH AT BEDTIME AS NEEDED FOR ANXIETY AND FOR SLEEP 30 tablet 2  . Azelaic Acid (FINACEA) 15 % cream Apply 1 application topically 2 (two) times daily. After skin is thoroughly washed and patted dry, gently but thoroughly massage a thin film of azelaic acid cream into the affected area twice daily, in the morning and evening.    . Cholecalciferol (VITAMIN D3) 25 MCG (1000 UT) CAPS Take by mouth.    . cyclobenzaprine (FLEXERIL) 10 MG tablet Take 1 tablet (10 mg total) by mouth daily. 90 tablet 1  . diclofenac sodium (VOLTAREN) 1 % GEL Apply 3 gm to 3 large joints up to 3 times a day.Dispense 3 tubes with 3 refills. 4 Tube 3  . DULoxetine (CYMBALTA) 60 MG capsule Take 1 capsule (60 mg total) by mouth daily. 90 capsule 2  . estradiol (VIVELLE-DOT) 0.0375 MG/24HR APPLY 1 PATCH TOPICALLY TWICE A WEEK  8  . levothyroxine (SYNTHROID) 25 MCG tablet Take 1 tablet (25 mcg total) by mouth daily before breakfast. 90 tablet 3  . losartan (COZAAR) 50 MG tablet Take 1 tablet (50 mg total) by mouth daily. 90 tablet 2  . Magnesium 250 MG TABS Take by mouth daily.    . meclizine  (ANTIVERT) 12.5 MG tablet Take 1 tablet (12.5 mg total) by mouth 3 (three) times daily as needed for dizziness. 60 tablet 1  . Multiple Vitamin (MULTIVITAMIN) tablet Take 1 tablet by mouth daily.    . pantoprazole (PROTONIX) 40 MG tablet Take 1 tablet (40 mg total) by mouth daily. 90 tablet 3  . propranolol (INDERAL) 40 MG tablet Take 1 tablet (40 mg total) by mouth 2 (two) times daily. 180 tablet 2  . Propylene Glycol (SYSTANE BALANCE OP) Apply to eye.    . vitamin B-12 (CYANOCOBALAMIN) 1000 MCG tablet Take 1,000 mcg by mouth once a week.     No current facility-administered medications on file prior to visit.     PAST MEDICAL HISTORY: Past Medical History:  Diagnosis Date  . Anxiety   . Arthritis   . Back pain   . Cancer ([place])    basal and squamous cell skin  . Chicken pox   . Chronic back pain   . COPD (chronic obstructive pulmonary disease) ([place])   . Depression   .  Gallbladder problem   . GERD (gastroesophageal reflux disease)   . Hypertension   . Hypothyroidism   . Joint pain   . Lactose intolerance   . Migraines   . Osteoarthritis   . SOB (shortness of breath)   . Thyroid disease   . Tremor   . Vitamin D deficiency     PAST SURGICAL HISTORY: Past Surgical History:  Procedure Laterality Date  . ABDOMINAL HYSTERECTOMY  1978  . CATARACT EXTRACTION Left 12/20/2017   will have the right one completed a month later   . CHOLECYSTECTOMY  2000  . COLONOSCOPY  2007  . JOINT REPLACEMENT Right 08/19/2017   great toe   . UPPER GASTROINTESTINAL ENDOSCOPY  [id number]    SOCIAL HISTORY: Social History   Tobacco Use  . Smoking status: Former Smoker    Packs/day: 1.50    Years: 25.00    Pack years: 35.50    Quit date: 03/03/1990    Years since quitting: 29.1  . Smokeless tobacco: Never Used  Substance Use Topics  . Alcohol use: Yes    Comment: glass of wine once a month  . Drug use: No    FAMILY HISTORY: Family History  Problem Relation Age of Onset  . Arthritis  Mother   . Diabetes Mother   . COPD Mother   . Heart failure Mother   . Stroke Father   . Hypertension Father   . Hyperlipidemia Father   . Diabetes Father   . CAD Father   . Heart disease Father   . Heart failure Father   . Breast cancer Maternal Aunt   . Cancer Sister        bladder cancer   . Throat cancer Brother   . Colon cancer Neg Hx   . Stomach cancer Neg Hx   . Esophageal cancer Neg Hx    ROS: Review of Systems  Gastrointestinal: Negative for nausea and vomiting.  Musculoskeletal:       Negative for muscle weakness.  Endo/Heme/Allergies:       Negative for polyphagia.   PHYSICAL EXAM: Blood pressure (!) 104/59, pulse 65, temperature 98.3 F (36.8 C), temperature source Oral, height 5\' 2"  (1.575 m), weight 172 lb (78 kg), SpO2 96 %. Body mass index is 31.46 kg/m. Physical Exam Vitals signs reviewed.  Constitutional:      Appearance: Normal appearance. She is obese.  Cardiovascular:     Rate and Rhythm: Normal rate.     Pulses: Normal pulses.  Pulmonary:     Effort: Pulmonary effort is normal.     Breath sounds: Normal breath sounds.  Musculoskeletal: Normal range of motion.  Skin:    General: Skin is warm and dry.  Neurological:     Mental Status: She is alert and oriented to person, place, and time.  Psychiatric:        Behavior: Behavior normal.   RECENT LABS AND TESTS: BMET    Component Value Date/Time   NA 135 03/26/2019 1058   K 4.3 03/26/2019 1058   CL 100 03/26/2019 1058   CO2 21 03/26/2019 1058   GLUCOSE 93 03/26/2019 1058   GLUCOSE 101 (H) 05/19/2018 1044   BUN 10 03/26/2019 1058   CREATININE 0.89 03/26/2019 1058   CREATININE 0.91 09/09/2017 0959   CALCIUM 8.9 03/26/2019 1058   GFRNONAA 67 03/26/2019 1058   GFRNONAA 65 09/09/2017 0959   GFRAA 77 03/26/2019 1058   GFRAA 76 09/09/2017 0959   Lab Results  Component Value  Date   HGBA1C 5.6 03/26/2019   HGBA1C 5.7 05/19/2018   Lab Results  Component Value Date   INSULIN 11.6  03/26/2019   CBC    Component Value Date/Time   WBC 11.6 (H) 01/18/2018 1400   RBC 4.69 01/18/2018 1400   HGB 14.6 01/18/2018 1400   HCT 42.8 01/18/2018 1400   PLT 308 01/18/2018 1400   MCV 91.3 01/18/2018 1400   MCH 31.1 01/18/2018 1400   MCHC 34.1 01/18/2018 1400   RDW 13.1 01/18/2018 1400   LYMPHSABS 1.0 01/18/2018 1400   MONOABS 0.5 01/18/2018 1400   EOSABS 0.1 01/18/2018 1400   BASOSABS 0.0 01/18/2018 1400   Iron/TIBC/Ferritin/ %Sat No results found for: IRON, TIBC, FERRITIN, IRONPCTSAT Lipid Panel     Component Value Date/Time   CHOL 256 (H) 03/26/2019 1058   TRIG 141 03/26/2019 1058   HDL 44 03/26/2019 1058   CHOLHDL 4.2 08/04/2017 0830   LDLCALC 184 (H) 03/26/2019 1058   Hepatic Function Panel     Component Value Date/Time   PROT 6.5 03/26/2019 1058   ALBUMIN 3.9 03/26/2019 1058   AST 23 03/26/2019 1058   ALT 22 03/26/2019 1058   ALKPHOS 81 03/26/2019 1058   BILITOT 0.3 03/26/2019 1058      Component Value Date/Time   TSH 2.170 03/26/2019 1058   TSH 1.86 05/19/2018 1044   TSH 5.040 (H) 08/04/2017 0830   Results for COLENA, SCHEELE (MRN [id number]) as of 05/01/2019 09:06  Ref. Range 03/26/2019 10:58  Vitamin D, 25-Hydroxy Latest Ref Range: 30.0 - 100.0 ng/mL 39.1   OBESITY BEHAVIORAL INTERVENTION VISIT  Today's visit was #3  Starting weight: 179 lbs Starting date: 03/26/2019 Today's weight: 172 lbs  Today's date: 04/26/2019 Total lbs lost to date: 7   04/26/2019  Height 5\' 2"  (1.575 m)  Weight 172 lb (78 kg)  BMI (Calculated) 31.45  BLOOD PRESSURE - SYSTOLIC [id number]  BLOOD PRESSURE - DIASTOLIC 59   Body Fat % [id number] %  Total Body Water (lbs) 65.8 lbs   ASK: We discussed the diagnosis of obesity with Kristina [name] today and Kristina [name] agreed to give [place] permission to discuss obesity behavioral modification therapy today.  ASSESS: Kristina [name] has the diagnosis of obesity and her BMI today is 31.5. Kristina [name] is in the action stage of change.   ADVISE: Kristina [name] was  educated on the multiple health risks of obesity as well as the benefit of weight loss to improve her health. She was advised of the need for long term treatment and the importance of lifestyle modifications to improve her current health and to decrease her risk of future health problems.  AGREE: Multiple dietary modification options and treatment options were discussed and  Kristina [name] agreed to follow the recommendations documented in the above note.  ARRANGE: Kristina [name] was educated on the importance of frequent visits to treat obesity as outlined per CMS and USPSTF guidelines and agreed to schedule her next follow up appointment today.  Kristina [name] Dk, am acting as Location manager for [employer], DO  I have reviewed the above documentation for accuracy and completeness, and I agree with the above. -Jearld Lesch, DO

## 2019-05-10 ENCOUNTER — Other Ambulatory Visit: Payer: Self-pay

## 2019-05-10 ENCOUNTER — Encounter (INDEPENDENT_AMBULATORY_CARE_PROVIDER_SITE_OTHER): Payer: Self-pay | Admitting: Bariatrics

## 2019-05-10 ENCOUNTER — Ambulatory Visit (INDEPENDENT_AMBULATORY_CARE_PROVIDER_SITE_OTHER): Payer: PPO | Admitting: Bariatrics

## 2019-05-10 VITALS — BP 108/60 | HR 73 | Temp 98.2°F | Ht 62.0 in | Wt 169.0 lb

## 2019-05-10 DIAGNOSIS — E6609 Other obesity due to excess calories: Secondary | ICD-10-CM | POA: Diagnosis not present

## 2019-05-10 DIAGNOSIS — Z6831 Body mass index (BMI) 31.0-31.9, adult: Secondary | ICD-10-CM | POA: Diagnosis not present

## 2019-05-10 DIAGNOSIS — F3289 Other specified depressive episodes: Secondary | ICD-10-CM | POA: Diagnosis not present

## 2019-05-10 DIAGNOSIS — E8881 Metabolic syndrome: Secondary | ICD-10-CM

## 2019-05-14 ENCOUNTER — Encounter (INDEPENDENT_AMBULATORY_CARE_PROVIDER_SITE_OTHER): Payer: Self-pay | Admitting: Bariatrics

## 2019-05-14 NOTE — Progress Notes (Signed)
Office: [phone]  /  Fax: [phone]   HPI:   Chief Complaint: OBESITY Kristina [name] is here to discuss her progress with her obesity treatment plan. She is on the Category 1 plan and is following her eating plan approximately 60% of the time. She states she is walking 75 minutes 4-5 times per week. Kristina [name] is down 3 lbs. She is "eating clean" and having some slight cravings. Her weight is 169 lb (76.7 kg) today and has had a weight loss of 3 pounds over a period of 2 weeks since her last visit. She has lost 10 lbs since starting treatment with [place].  Depression with emotional eating behaviors Kristina [name] is struggling with emotional eating and using food for comfort to the extent that it is negatively impacting her health. She often snacks when she is not hungry. Kristina [name] sometimes feels she is out of control and then feels guilty that she made poor food choices. She has been working on behavior modification techniques to help reduce her emotional eating and has been somewhat successful. Kristina [name] has seen Kristina [name]. She shows no sign of suicidal or homicidal ideations.  Depression screen [facility] 2/9 03/26/2019 12/20/2018 12/16/2017  Decreased Interest 1 0 0  Down, Depressed, Hopeless 2 0 0  PHQ - 2 Score 3 0 0  Altered sleeping 0 - -  Tired, decreased energy 3 - -  Change in appetite 2 - -  Feeling bad or failure about yourself  2 - -  Trouble concentrating 1 - -  Moving slowly or fidgety/restless 1 - -  Suicidal thoughts 0 - -  PHQ-9 Score 12 - -  Difficult doing work/chores Somewhat difficult - -   Insulin Resistance, Mild Kristina [name] has a diagnosis of insulin resistance based on her elevated fasting insulin level >5. Last A1c 5.6 on 03/26/2019 with an insulin of 11.6. Although Kristina [name]'s blood glucose readings are still under good control, insulin resistance puts her at greater risk of metabolic syndrome and diabetes. She is not taking metformin currently and continues to work on diet and exercise to decrease  risk of diabetes.  ASSESSMENT AND PLAN:  Insulin resistance  Other depression  Class 1 obesity due to excess calories with body mass index (BMI) of 31.0 to 31.9 in adult, unspecified whether serious comorbidity present  PLAN:  Depression with Emotional Eating Behaviors We discussed behavior modification techniques today to help Kristina [name] deal with her emotional eating and depression. Alon will follow-up with Kristina [name].  Insulin Resistance, Mild Kristina [name] will continue to work on weight loss, exercise, and decreasing simple carbohydrates in her diet to help decrease the risk of diabetes. We dicussed metformin including benefits and risks. She was informed that eating too many simple carbohydrates or too many calories at one sitting increases the likelihood of GI side effects. Kristina [name] was instructed to decrease carbohydrates, increase protein, and increase activity. She will follow-up with [place] as directed to monitor her progress.  I spent > than 50% of the 15 minute visit on counseling as documented in the note.  Obesity Kristina [name] is currently in the action stage of change. As such, her goal is to continue with weight loss efforts. She has agreed to follow the Category 1 plan and journal 300-400 calories and 35 grams of protein at supper. Kristina [name] will work on meal planning and intentional eating. Kristina [name] has been instructed to continue to walk or exercise on exercise bike for weight loss and overall health benefits. We discussed the following Behavioral Modification Strategies today:  increasing lean protein intake, decreasing simple carbohydrates, increasing vegetables, increase H20 intake, decrease eating out, no skipping meals, work on meal planning and easy cooking plans, keeping healthy foods in the home, and planning for success.  Kristina [name] has agreed to follow-up with our clinic in 2 weeks. She was informed of the importance of frequent follow-up visits to maximize her success with intensive lifestyle  modifications for her multiple health conditions.  ALLERGIES: Allergies  Allergen Reactions  . Meloxicam Other (See Comments)    "skin infection on leg"  . Sulfa Antibiotics Rash    Rash all over body/fever  . Hydrocodone Nausea Only    Per patient     MEDICATIONS: Current Outpatient Medications on File Prior to Visit  Medication Sig Dispense Refill  . Acetaminophen (TYLENOL PO) Take 400 mg by mouth 2 (two) times daily.    Marland Kitchen albuterol (PROVENTIL HFA;VENTOLIN HFA) 108 (90 Base) MCG/ACT inhaler Inhale 2 puffs into the lungs every 6 (six) hours as needed for wheezing or shortness of breath. 1 Inhaler 2  . ALPRAZolam (XANAX) 0.25 MG tablet TAKE 1 TABLET BY MOUTH AT BEDTIME AS NEEDED FOR ANXIETY AND FOR SLEEP 30 tablet 2  . Azelaic Acid (FINACEA) 15 % cream Apply 1 application topically 2 (two) times daily. After skin is thoroughly washed and patted dry, gently but thoroughly massage a thin film of azelaic acid cream into the affected area twice daily, in the morning and evening.    . Cholecalciferol (VITAMIN D3) 25 MCG (1000 UT) CAPS Take by mouth.    . cyclobenzaprine (FLEXERIL) 10 MG tablet Take 1 tablet (10 mg total) by mouth daily. 90 tablet 1  . diclofenac sodium (VOLTAREN) 1 % GEL Apply 3 gm to 3 large joints up to 3 times a day.Dispense 3 tubes with 3 refills. 4 Tube 3  . DULoxetine (CYMBALTA) 60 MG capsule Take 1 capsule (60 mg total) by mouth daily. 90 capsule 2  . estradiol (VIVELLE-DOT) 0.0375 MG/24HR APPLY 1 PATCH TOPICALLY TWICE A WEEK  8  . levothyroxine (SYNTHROID) 25 MCG tablet Take 1 tablet (25 mcg total) by mouth daily before breakfast. 90 tablet 3  . losartan (COZAAR) 50 MG tablet Take 1 tablet (50 mg total) by mouth daily. 90 tablet 2  . Magnesium 250 MG TABS Take by mouth daily.    . meclizine (ANTIVERT) 12.5 MG tablet Take 1 tablet (12.5 mg total) by mouth 3 (three) times daily as needed for dizziness. 60 tablet 1  . Multiple Vitamin (MULTIVITAMIN) tablet Take 1 tablet  by mouth daily.    . pantoprazole (PROTONIX) 40 MG tablet Take 1 tablet (40 mg total) by mouth daily. 90 tablet 3  . propranolol (INDERAL) 40 MG tablet Take 1 tablet (40 mg total) by mouth 2 (two) times daily. 180 tablet 2  . Propylene Glycol (SYSTANE BALANCE OP) Apply to eye.    . vitamin B-12 (CYANOCOBALAMIN) 1000 MCG tablet Take 1,000 mcg by mouth once a week.    . Vitamin D, Ergocalciferol, (DRISDOL) 1.25 MG (50000 UT) CAPS capsule Take 1 capsule (50,000 Units total) by mouth every 7 (seven) days. 12 capsule 0   No current facility-administered medications on file prior to visit.     PAST MEDICAL HISTORY: Past Medical History:  Diagnosis Date  . Anxiety   . Arthritis   . Back pain   . Cancer ([place])    basal and squamous cell skin  . Chicken pox   . Chronic back pain   .  COPD (chronic obstructive pulmonary disease) ([place])   . Depression   . Gallbladder problem   . GERD (gastroesophageal reflux disease)   . Hypertension   . Hypothyroidism   . Joint pain   . Lactose intolerance   . Migraines   . Osteoarthritis   . SOB (shortness of breath)   . Thyroid disease   . Tremor   . Vitamin D deficiency     PAST SURGICAL HISTORY: Past Surgical History:  Procedure Laterality Date  . ABDOMINAL HYSTERECTOMY  1978  . CATARACT EXTRACTION Left 12/20/2017   will have the right one completed a month later   . CHOLECYSTECTOMY  2000  . COLONOSCOPY  2007  . JOINT REPLACEMENT Right 08/19/2017   great toe   . UPPER GASTROINTESTINAL ENDOSCOPY  [id number]    SOCIAL HISTORY: Social History   Tobacco Use  . Smoking status: Former Smoker    Packs/day: 1.50    Years: 25.00    Pack years: 91.50    Quit date: 03/03/1990    Years since quitting: 29.2  . Smokeless tobacco: Never Used  Substance Use Topics  . Alcohol use: Yes    Comment: glass of wine once a month  . Drug use: No    FAMILY HISTORY: Family History  Problem Relation Age of Onset  . Arthritis Mother   . Diabetes Mother    . COPD Mother   . Heart failure Mother   . Stroke Father   . Hypertension Father   . Hyperlipidemia Father   . Diabetes Father   . CAD Father   . Heart disease Father   . Heart failure Father   . Breast cancer Maternal Aunt   . Cancer Sister        bladder cancer   . Throat cancer Brother   . Colon cancer Neg Hx   . Stomach cancer Neg Hx   . Esophageal cancer Neg Hx    ROS: Review of Systems  Psychiatric/Behavioral: Positive for depression (emotional eating). Negative for suicidal ideas.       Negative for homicidal ideas.   PHYSICAL EXAM: Blood pressure 108/60, pulse 73, temperature 98.2 F (36.8 C), temperature source Oral, height 5\' 2"  (1.575 m), weight 169 lb (76.7 kg), SpO2 97 %. Body mass index is 30.91 kg/m. Physical Exam Vitals signs reviewed.  Constitutional:      Appearance: Normal appearance. She is obese.  Cardiovascular:     Rate and Rhythm: Normal rate.     Pulses: Normal pulses.  Pulmonary:     Effort: Pulmonary effort is normal.     Breath sounds: Normal breath sounds.  Musculoskeletal: Normal range of motion.  Skin:    General: Skin is warm and dry.  Neurological:     Mental Status: She is alert and oriented to person, place, and time.  Psychiatric:        Behavior: Behavior normal.   RECENT LABS AND TESTS: BMET    Component Value Date/Time   NA 135 03/26/2019 1058   K 4.3 03/26/2019 1058   CL 100 03/26/2019 1058   CO2 21 03/26/2019 1058   GLUCOSE 93 03/26/2019 1058   GLUCOSE 101 (H) 05/19/2018 1044   BUN 10 03/26/2019 1058   CREATININE 0.89 03/26/2019 1058   CREATININE 0.91 09/09/2017 0959   CALCIUM 8.9 03/26/2019 1058   GFRNONAA 67 03/26/2019 1058   GFRNONAA 65 09/09/2017 0959   GFRAA 77 03/26/2019 1058   GFRAA 76 09/09/2017 0959   Lab Results  Component Value Date   HGBA1C 5.6 03/26/2019   HGBA1C 5.7 05/19/2018   Lab Results  Component Value Date   INSULIN 11.6 03/26/2019   CBC    Component Value Date/Time   WBC 11.6  (H) 01/18/2018 1400   RBC 4.69 01/18/2018 1400   HGB 14.6 01/18/2018 1400   HCT 42.8 01/18/2018 1400   PLT 308 01/18/2018 1400   MCV 91.3 01/18/2018 1400   MCH 31.1 01/18/2018 1400   MCHC 34.1 01/18/2018 1400   RDW 13.1 01/18/2018 1400   LYMPHSABS 1.0 01/18/2018 1400   MONOABS 0.5 01/18/2018 1400   EOSABS 0.1 01/18/2018 1400   BASOSABS 0.0 01/18/2018 1400   Iron/TIBC/Ferritin/ %Sat No results found for: IRON, TIBC, FERRITIN, IRONPCTSAT Lipid Panel     Component Value Date/Time   CHOL 256 (H) 03/26/2019 1058   TRIG 141 03/26/2019 1058   HDL 44 03/26/2019 1058   CHOLHDL 4.2 08/04/2017 0830   LDLCALC 184 (H) 03/26/2019 1058   Hepatic Function Panel     Component Value Date/Time   PROT 6.5 03/26/2019 1058   ALBUMIN 3.9 03/26/2019 1058   AST 23 03/26/2019 1058   ALT 22 03/26/2019 1058   ALKPHOS 81 03/26/2019 1058   BILITOT 0.3 03/26/2019 1058      Component Value Date/Time   TSH 2.170 03/26/2019 1058   TSH 1.86 05/19/2018 1044   TSH 5.040 (H) 08/04/2017 0830   Results for SHANEIKA, FILIPIAK (MRN [id number]) as of 05/14/2019 09:50  Ref. Range 03/26/2019 10:58  Vitamin D, 25-Hydroxy Latest Ref Range: 30.0 - 100.0 ng/mL 39.1   OBESITY BEHAVIORAL INTERVENTION VISIT  Today's visit was #4  Starting weight: 179 lbs Starting date: 03/26/2019 Today's weight: 169 lbs  Today's date: 05/10/2019 Total lbs lost to date: 10    05/10/2019  Height 5\' 2"  (1.575 m)  Weight 169 lb (76.7 kg)  BMI (Calculated) 30.9  BLOOD PRESSURE - SYSTOLIC [id number]  BLOOD PRESSURE - DIASTOLIC 60   Body Fat % 123XX123 %  Total Body Water (lbs) 65.8 lbs   ASK: We discussed the diagnosis of obesity with Kristina [name] today and Kristina [name] agreed to give [place] permission to discuss obesity behavioral modification therapy today.  ASSESS: Kristina [name] has the diagnosis of obesity and her BMI today is 31.1. Kristina [name] is in the action stage of change.  ADVISE: Kristina [name] was educated on the multiple health risks of obesity as well as the  benefit of weight loss to improve her health. She was advised of the need for long term treatment and the importance of lifestyle modifications to improve her current health and to decrease her risk of future health problems.  AGREE: Multiple dietary modification options and treatment options were discussed and  Kristina [name] agreed to follow the recommendations documented in the above note.  ARRANGE: Kristina [name] was educated on the importance of frequent visits to treat obesity as outlined per CMS and USPSTF guidelines and agreed to schedule her next follow up appointment today.  Migdalia Dk, am acting as Location manager for [employer], DO   I have reviewed the above documentation for accuracy and completeness, and I agree with the above. -Jearld Lesch, DO

## 2019-05-15 NOTE — Progress Notes (Unsigned)
Office: [phone]  /  Fax: [phone]    Date: May 21, 2019   Appointment Start Time:*** Duration:*** Provider: Glennie Isle, Psy.D. Type of Session: Individual Therapy  Location of Patient: *** Location of Provider: {Location of Service:[address]} Type of Contact: Telepsychological Visit via [employer] Web[employer]   Session Content: Kristina [name] is a 68 y.o. female presenting via [place] for a follow-up appointment to address the previously established treatment goal of decreasing emotional eating. Today's appointment was a telepsychological visit, as it is an option for appointments to reduce exposure to COVID-19. Kristina [name] expressed understanding regarding the rationale for telepsychological services, and provided verbal consent for today's appointment. Prior to proceeding with today's appointment, Kristina [name]'s physical location at the time of this appointment was obtained. Kristina [name] reported she was at *** and provided the address. In the event of technical difficulties, Kristina [name] shared a phone number she could be reached at. Kristina [name] and this provider participated in today's telepsychological service. Also, Kristina [name] denied anyone else being present in the room or on the WebEx appointment ***.  This provider conducted a brief check-in and verbally administered the PHQ-9 and GAD-7. *** [name] was receptive to today's session as evidenced by openness to sharing, responsiveness to feedback, and ***.  Mental Status Examination:  Appearance: {Appearance:22431} Behavior: {Behavior:22445} Mood: {gbmood:21757} Affect: {Affect:22436} Speech: {Speech:22432} Eye Contact: {Eye Contact:22433} Psychomotor Activity: {Motor Activity:22434} Thought Process: {thought process:22448}  Content/Perceptual Disturbances: {disturbances:22451} Orientation: {Orientation:22437} Cognition/Sensorium: {gbcognition:22449} Insight: {Insight:22446} Judgment: {Insight:22446}  Structured Assessment Results: The Patient Health Questionnaire-9  (PHQ-9) is a self-report measure that assesses symptoms and severity of depression over the course of the last two weeks. Kristina [name] obtained a score of *** suggesting {GBPHQ9SEVERITY:21752}. Kristina [name] finds the endorsed symptoms to be {gbphq9difficulty:21754}. Little interest or pleasure in doing things ***  Feeling down, depressed, or hopeless ***  Trouble falling or staying asleep, or sleeping too much ***  Feeling tired or having little energy ***  Poor appetite or overeating ***  Feeling bad about yourself --- or that you are a failure or have let yourself or your family down ***  Trouble concentrating on things, such as reading the newspaper or watching television ***  Moving or speaking so slowly that other people could have noticed? Or the opposite --- being so fidgety or restless that you have been moving around a lot more than usual ***  Thoughts that you would be better off dead or hurting yourself in some way ***  PHQ-9 Score ***    The Generalized Anxiety Disorder-7 (GAD-7) is a brief self-report measure that assesses symptoms of anxiety over the course of the last two weeks. Kristina [name] obtained a score of *** suggesting {gbgad7severity:21753}. Kristina [name] finds the endorsed symptoms to be {gbphq9difficulty:21754}. Feeling nervous, anxious, on edge ***  Not being able to stop or control worrying ***  Worrying too much about different things ***  Trouble relaxing ***  Being so restless that it's hard to sit still ***  Becoming easily annoyed or irritable ***  Feeling afraid as if something awful might happen ***  GAD-7 Score ***   Interventions:  {Interventions:22172}  DSM-5 Diagnosis: 311 (F32.8) Other Specified Depressive Disorder, Emotional Eating Behaviors  Treatment Goal & Progress: During the initial appointment with this provider, the following treatment goal was established: decrease emotional eating. Kristina [name] has demonstrated progress in her goal as evidenced by {gbtxprogress:22839}. Kristina [name]  also reported {gbtxprogress2:22951}.  Plan: [name] continues to appear able and willing to participate as evidenced by engagement in reciprocal conversation, and asking questions  for clarification as appropriate. The next appointment will be scheduled in {gbweeks:21758}, which will be via [employer]. The next session will focus on reviewing learned skills, and working towards the established treatment goal.***

## 2019-05-21 ENCOUNTER — Ambulatory Visit: Payer: PPO

## 2019-05-21 ENCOUNTER — Ambulatory Visit (INDEPENDENT_AMBULATORY_CARE_PROVIDER_SITE_OTHER): Payer: Self-pay | Admitting: Psychology

## 2019-05-24 ENCOUNTER — Ambulatory Visit (INDEPENDENT_AMBULATORY_CARE_PROVIDER_SITE_OTHER): Payer: PPO | Admitting: Bariatrics

## 2019-05-24 ENCOUNTER — Other Ambulatory Visit: Payer: Self-pay

## 2019-05-24 VITALS — BP 113/64 | HR 68 | Temp 98.5°F | Ht 62.0 in | Wt 169.0 lb

## 2019-05-24 DIAGNOSIS — E8881 Metabolic syndrome: Secondary | ICD-10-CM

## 2019-05-24 DIAGNOSIS — E669 Obesity, unspecified: Secondary | ICD-10-CM

## 2019-05-24 DIAGNOSIS — F3289 Other specified depressive episodes: Secondary | ICD-10-CM | POA: Diagnosis not present

## 2019-05-24 DIAGNOSIS — Z6830 Body mass index (BMI) 30.0-30.9, adult: Secondary | ICD-10-CM | POA: Diagnosis not present

## 2019-05-29 ENCOUNTER — Encounter (INDEPENDENT_AMBULATORY_CARE_PROVIDER_SITE_OTHER): Payer: Self-pay | Admitting: Bariatrics

## 2019-05-29 NOTE — Progress Notes (Signed)
Office: [phone]  /  Fax: [phone]   HPI:   Chief Complaint: OBESITY Kristina [name] is here to discuss her progress with her obesity treatment plan. She is on the Category 1 plan with lunch options and is following her eating plan approximately 80% of the time. She states she is walking 60-75 minutes 5 times per week and biking 20 minutes 5 times per week. Sanai's weight remains the same since her last visit. She states that she did "cheat" twice. She is doing well with her water and protein intake. Her weight is 169 lb (76.7 kg) today and has not lost weight since her last visit. She has lost 10 lbs since starting treatment with [place].  Depression, Other Kristina [name] is struggling with emotional eating and using food for comfort to the extent that it is negatively impacting her health. She often snacks when she is not hungry. Kristina [name] sometimes feels she is out of control and then feels guilty that she made poor food choices. She has been working on behavior modification techniques to help reduce her emotional eating and has been somewhat successful. She shows no sign of suicidal or homicidal ideations.  Depression screen Kristina [name] 2/9 03/26/2019 12/20/2018 12/16/2017  Decreased Interest 1 0 0  Down, Depressed, Hopeless 2 0 0  PHQ - 2 Score 3 0 0  Altered sleeping 0 - -  Tired, decreased energy 3 - -  Change in appetite 2 - -  Feeling bad or failure about yourself  2 - -  Trouble concentrating 1 - -  Moving slowly or fidgety/restless 1 - -  Suicidal thoughts 0 - -  PHQ-9 Score 12 - -  Difficult doing work/chores Somewhat difficult - -   Insulin Resistance Kristina [name] has a diagnosis of insulin resistance based on her elevated fasting insulin level >5. Last A1c 5.6 on 03/26/2019 with an insulin of 11.6. Although Kristina [name]'s blood glucose readings are still under good control, insulin resistance puts her at greater risk of metabolic syndrome and diabetes. She is not taking metformin currently and continues to work on  diet and exercise to decrease risk of diabetes. No polyphagia.  ASSESSMENT AND PLAN:  Insulin resistance  Other depression - with emotional eating   Class 1 obesity with serious comorbidity and body mass index (BMI) of 30.0 to 30.9 in adult, unspecified obesity type  PLAN:  Depression, Other We discussed CBT techniques. She will follow-up as directed to monitor her progress.  Insulin Resistance Kristina [name] will continue to work on weight loss, exercise, and decreasing simple carbohydrates in her diet to help decrease the risk of diabetes. We dicussed metformin including benefits and risks. She was informed that eating too many simple carbohydrates or too many calories at one sitting increases the likelihood of GI side effects. Kristina [name] was instructed to decrease carbohydrates, increase protein, and increase her exercise. She will follow-up with [place] as directed to monitor her progress.  Obesity Kristina [name] is currently in the action stage of change. As such, her goal is to continue with weight loss efforts. She has agreed to follow the Category 1 plan and will journal 300-400 calories and 35+ grams of protein at supper. Kristina [name] will work on meal planning and intentional eating. Kristina [name] has been instructed to start using resistance bands for weight loss and overall health benefits. We discussed the following Behavioral Modification Stratagies today: increasing lean protein intake, decreasing simple carbohydrates, increasing vegetables, increase H20 intake, decrease eating out, no skipping meals, work on meal planning and easy cooking  plans, and keeping healthy foods in the home.  Kristina [name] has agreed to follow-up with our clinic in 2 weeks. She was informed of the importance of frequent follow-up visits to maximize her success with intensive lifestyle modifications for her multiple health conditions.  ALLERGIES: Allergies  Allergen Reactions  . Meloxicam Other (See Comments)    "skin infection on leg"  .  Sulfa Antibiotics Rash    Rash all over body/fever  . Hydrocodone Nausea Only    Per patient     MEDICATIONS: Current Outpatient Medications on File Prior to Visit  Medication Sig Dispense Refill  . Acetaminophen (TYLENOL PO) Take 400 mg by mouth 2 (two) times daily.    Marland Kitchen albuterol (PROVENTIL HFA;VENTOLIN HFA) 108 (90 Base) MCG/ACT inhaler Inhale 2 puffs into the lungs every 6 (six) hours as needed for wheezing or shortness of breath. 1 Inhaler 2  . ALPRAZolam (XANAX) 0.25 MG tablet TAKE 1 TABLET BY MOUTH AT BEDTIME AS NEEDED FOR ANXIETY AND FOR SLEEP 30 tablet 2  . Azelaic Acid (FINACEA) 15 % cream Apply 1 application topically 2 (two) times daily. After skin is thoroughly washed and patted dry, gently but thoroughly massage a thin film of azelaic acid cream into the affected area twice daily, in the morning and evening.    . Cholecalciferol (VITAMIN D3) 25 MCG (1000 UT) CAPS Take by mouth.    . cyclobenzaprine (FLEXERIL) 10 MG tablet Take 1 tablet (10 mg total) by mouth daily. 90 tablet 1  . diclofenac sodium (VOLTAREN) 1 % GEL Apply 3 gm to 3 large joints up to 3 times a day.Dispense 3 tubes with 3 refills. 4 Tube 3  . DULoxetine (CYMBALTA) 60 MG capsule Take 1 capsule (60 mg total) by mouth daily. 90 capsule 2  . estradiol (VIVELLE-DOT) 0.0375 MG/24HR APPLY 1 PATCH TOPICALLY TWICE A WEEK  8  . levothyroxine (SYNTHROID) 25 MCG tablet Take 1 tablet (25 mcg total) by mouth daily before breakfast. 90 tablet 3  . losartan (COZAAR) 50 MG tablet Take 1 tablet (50 mg total) by mouth daily. 90 tablet 2  . Magnesium 250 MG TABS Take by mouth daily.    . meclizine (ANTIVERT) 12.5 MG tablet Take 1 tablet (12.5 mg total) by mouth 3 (three) times daily as needed for dizziness. 60 tablet 1  . Multiple Vitamin (MULTIVITAMIN) tablet Take 1 tablet by mouth daily.    . pantoprazole (PROTONIX) 40 MG tablet Take 1 tablet (40 mg total) by mouth daily. 90 tablet 3  . propranolol (INDERAL) 40 MG tablet Take 1  tablet (40 mg total) by mouth 2 (two) times daily. 180 tablet 2  . Propylene Glycol (SYSTANE BALANCE OP) Apply to eye.    . vitamin B-12 (CYANOCOBALAMIN) 1000 MCG tablet Take 1,000 mcg by mouth once a week.    . Vitamin D, Ergocalciferol, (DRISDOL) 1.25 MG (50000 UT) CAPS capsule Take 1 capsule (50,000 Units total) by mouth every 7 (seven) days. 12 capsule 0   No current facility-administered medications on file prior to visit.     PAST MEDICAL HISTORY: Past Medical History:  Diagnosis Date  . Anxiety   . Arthritis   . Back pain   . Cancer ([place])    basal and squamous cell skin  . Chicken pox   . Chronic back pain   . COPD (chronic obstructive pulmonary disease) ([place])   . Depression   . Gallbladder problem   . GERD (gastroesophageal reflux disease)   . Hypertension   .  Hypothyroidism   . Joint pain   . Lactose intolerance   . Migraines   . Osteoarthritis   . SOB (shortness of breath)   . Thyroid disease   . Tremor   . Vitamin D deficiency     PAST SURGICAL HISTORY: Past Surgical History:  Procedure Laterality Date  . ABDOMINAL HYSTERECTOMY  1978  . CATARACT EXTRACTION Left 12/20/2017   will have the right one completed a month later   . CHOLECYSTECTOMY  2000  . COLONOSCOPY  2007  . JOINT REPLACEMENT Right 08/19/2017   great toe   . UPPER GASTROINTESTINAL ENDOSCOPY  [id number]    SOCIAL HISTORY: Social History   Tobacco Use  . Smoking status: Former Smoker    Packs/day: 1.50    Years: 25.00    Pack years: 49.50    Quit date: 03/03/1990    Years since quitting: 29.2  . Smokeless tobacco: Never Used  Substance Use Topics  . Alcohol use: Yes    Comment: glass of wine once a month  . Drug use: No    FAMILY HISTORY: Family History  Problem Relation Age of Onset  . Arthritis Mother   . Diabetes Mother   . COPD Mother   . Heart failure Mother   . Stroke Father   . Hypertension Father   . Hyperlipidemia Father   . Diabetes Father   . CAD Father   . Heart  disease Father   . Heart failure Father   . Breast cancer Maternal Aunt   . Cancer Sister        bladder cancer   . Throat cancer Brother   . Colon cancer Neg Hx   . Stomach cancer Neg Hx   . Esophageal cancer Neg Hx    ROS: Review of Systems  Endo/Heme/Allergies:       Negative for polyphagia.  Psychiatric/Behavioral: Positive for depression. Negative for suicidal ideas.       Negative for homicidal ideas.   PHYSICAL EXAM: Blood pressure 113/64, pulse 68, temperature 98.5 F (36.9 C), temperature source Oral, height 5\' 2"  (1.575 m), weight 169 lb (76.7 kg), SpO2 99 %. Body mass index is 30.91 kg/m. Physical Exam Vitals signs reviewed.  Constitutional:      Appearance: Normal appearance. She is obese.  Cardiovascular:     Rate and Rhythm: Normal rate.     Pulses: Normal pulses.  Pulmonary:     Effort: Pulmonary effort is normal.     Breath sounds: Normal breath sounds.  Musculoskeletal: Normal range of motion.  Skin:    General: Skin is warm and dry.  Neurological:     Mental Status: She is alert and oriented to person, place, and time.  Psychiatric:        Behavior: Behavior normal.   RECENT LABS AND TESTS: BMET    Component Value Date/Time   NA 135 03/26/2019 1058   K 4.3 03/26/2019 1058   CL 100 03/26/2019 1058   CO2 21 03/26/2019 1058   GLUCOSE 93 03/26/2019 1058   GLUCOSE 101 (H) 05/19/2018 1044   BUN 10 03/26/2019 1058   CREATININE 0.89 03/26/2019 1058   CREATININE 0.91 09/09/2017 0959   CALCIUM 8.9 03/26/2019 1058   GFRNONAA 67 03/26/2019 1058   GFRNONAA 65 09/09/2017 0959   GFRAA 77 03/26/2019 1058   GFRAA 76 09/09/2017 0959   Lab Results  Component Value Date   HGBA1C 5.6 03/26/2019   HGBA1C 5.7 05/19/2018   Lab Results  Component  Value Date   INSULIN 11.6 03/26/2019   CBC    Component Value Date/Time   WBC 11.6 (H) 01/18/2018 1400   RBC 4.69 01/18/2018 1400   HGB 14.6 01/18/2018 1400   HCT 42.8 01/18/2018 1400   PLT 308 01/18/2018  1400   MCV 91.3 01/18/2018 1400   MCH 31.1 01/18/2018 1400   MCHC 34.1 01/18/2018 1400   RDW 13.1 01/18/2018 1400   LYMPHSABS 1.0 01/18/2018 1400   MONOABS 0.5 01/18/2018 1400   EOSABS 0.1 01/18/2018 1400   BASOSABS 0.0 01/18/2018 1400   Iron/TIBC/Ferritin/ %Sat No results found for: IRON, TIBC, FERRITIN, IRONPCTSAT Lipid Panel     Component Value Date/Time   CHOL 256 (H) 03/26/2019 1058   TRIG 141 03/26/2019 1058   HDL 44 03/26/2019 1058   CHOLHDL 4.2 08/04/2017 0830   LDLCALC 184 (H) 03/26/2019 1058   Hepatic Function Panel     Component Value Date/Time   PROT 6.5 03/26/2019 1058   ALBUMIN 3.9 03/26/2019 1058   AST 23 03/26/2019 1058   ALT 22 03/26/2019 1058   ALKPHOS 81 03/26/2019 1058   BILITOT 0.3 03/26/2019 1058      Component Value Date/Time   TSH 2.170 03/26/2019 1058   TSH 1.86 05/19/2018 1044   TSH 5.040 (H) 08/04/2017 0830   Results for DAIYA, PRUITTE (MRN [id number]) as of 05/29/2019 10:47  Ref. Range 03/26/2019 10:58  Vitamin D, 25-Hydroxy Latest Ref Range: 30.0 - 100.0 ng/mL 39.1   OBESITY BEHAVIORAL INTERVENTION VISIT  Today's visit was #5  Starting weight: 179 lbs Starting date: 03/26/2019 Today's weight: 169 lbs  Today's date: 05/24/2019 Total lbs lost to date: 10 At least 15 minutes were spent on discussing the following behavioral intervention visit.    05/24/2019  Height 5\' 2"  (1.575 m)  Weight 169 lb (76.7 kg)  BMI (Calculated) 30.9  BLOOD PRESSURE - SYSTOLIC [id number]  BLOOD PRESSURE - DIASTOLIC 64   Body Fat % 0000000 %  Total Body Water (lbs) 67.2 lbs   ASK: We discussed the diagnosis of obesity with Kristina [name] today and Kristina [name] agreed to give [place] permission to discuss obesity behavioral modification therapy today.  ASSESS: Rosealee has the diagnosis of obesity and her BMI today is 30.9. Kristina [name] is in the action stage of change.   ADVISE: Kristina [name] was educated on the multiple health risks of obesity as well as the benefit of weight loss to  improve her health. She was advised of the need for long term treatment and the importance of lifestyle modifications to improve her current health and to decrease her risk of future health problems.  AGREE: Multiple dietary modification options and treatment options were discussed and  Kristina [name] agreed to follow the recommendations documented in the above note.  ARRANGE: Kristina [name] was educated on the importance of frequent visits to treat obesity as outlined per CMS and USPSTF guidelines and agreed to schedule her next follow up appointment today.  Migdalia Dk, am acting as Location manager for [employer], DO   I have reviewed the above documentation for accuracy and completeness, and I agree with the above. -Jearld Lesch, DO

## 2019-06-12 ENCOUNTER — Ambulatory Visit (INDEPENDENT_AMBULATORY_CARE_PROVIDER_SITE_OTHER): Payer: PPO | Admitting: Family Medicine

## 2019-06-12 ENCOUNTER — Other Ambulatory Visit: Payer: Self-pay

## 2019-06-12 ENCOUNTER — Encounter (INDEPENDENT_AMBULATORY_CARE_PROVIDER_SITE_OTHER): Payer: Self-pay | Admitting: Family Medicine

## 2019-06-12 VITALS — BP 99/62 | HR 71 | Temp 97.9°F | Ht 62.0 in | Wt 165.0 lb

## 2019-06-12 DIAGNOSIS — Z6830 Body mass index (BMI) 30.0-30.9, adult: Secondary | ICD-10-CM

## 2019-06-12 DIAGNOSIS — E8881 Metabolic syndrome: Secondary | ICD-10-CM

## 2019-06-12 DIAGNOSIS — E669 Obesity, unspecified: Secondary | ICD-10-CM

## 2019-06-12 DIAGNOSIS — I10 Essential (primary) hypertension: Secondary | ICD-10-CM

## 2019-06-12 MED ORDER — LOSARTAN POTASSIUM 50 MG PO TABS: 25.0000 mg | ORAL_TABLET | Freq: Every day | ORAL | 0 refills | Status: DC

## 2019-06-13 NOTE — Progress Notes (Signed)
Office: [phone]  /  Fax: [phone]   HPI:   Chief Complaint: OBESITY Kristina [name] is here to discuss her progress with her obesity treatment plan. She is on the Category 1 plan and is following her eating plan approximately 100 % of the time. She states she is exercising 0 minutes 0 times per week. Kristina [name] has adhered to the plan very well. She reports that she struggles to get the protein in, but she mostly gets it in. Kristina [name] denies excessive hunger.  Her weight is 165 lb (74.8 kg) today and has had a weight loss of 4 pounds over a period of 3 weeks since her last visit. She has lost 14 lbs since starting treatment with [place].  Insulin Resistance Kristina [name] has a diagnosis of insulin resistance based on her elevated fasting insulin level >5. Although Kristina [name] blood glucose readings are still under good control, insulin resistance puts her at greater risk of metabolic syndrome and diabetes. She is not taking metformin currently and continues to work on diet and exercise to decrease risk of diabetes. Kristina [name] denies polyphagia.  Hypertension Kristina [name] is a 68 y.o. female with hypertension. Her blood pressure is low today (99/62). She is on both beta blocker and Losartan 50 mg. She reports occasional dizziness. Kristina [name] denies chest pain or shortness of breath on exertion. Kristina [name] takes a beta blocker for blood pressure and tremor. She is working weight loss to help control her blood pressure with the goal of decreasing her risk of heart attack and stroke.  ASSESSMENT AND PLAN:  Insulin resistance  Essential hypertension  Class 1 obesity with serious comorbidity and body mass index (BMI) of 30.0 to 30.9 in adult, unspecified obesity type  PLAN:  Insulin Resistance Kristina [name] will continue to work on weight loss, exercise, and decreasing simple carbohydrates in her diet to help decrease the risk of diabetes. She was informed that eating too many simple carbohydrates or too many calories at one sitting  increases the likelihood of GI side effects. Kristina [name] will continue with the meal plan and follow up with [place] as directed to monitor her progress.  Hypertension We discussed sodium restriction, working on healthy weight loss, and a regular exercise program as the means to achieve improved blood pressure control. Kristina [name] agreed with this plan and agreed to follow up as directed. We will continue to monitor her blood pressure as well as her progress with the above lifestyle modifications. Bethyl agrees to decrease the dose of Losartan to 25 mg daily (no prescription refill needed).  Obesity Kristina [name] is currently in the action stage of change. As such, her goal is to continue with weight loss efforts She has agreed to follow the Category 1 plan Kristina [name] will get back to exercise (5 days per week) for weight loss and overall health benefits. We discussed the following Behavioral Modification Strategies today: planning for success, better snacking choices, increasing lean protein intake and work on meal planning and easy cooking plans  We discussed protein exchanges for 2 oz. meat.  Kristina [name] has agreed to follow up with our clinic in 2 to 3 weeks. She was informed of the importance of frequent follow up visits to maximize her success with intensive lifestyle modifications for her multiple health conditions.  ALLERGIES: Allergies  Allergen Reactions  . Meloxicam Other (See Comments)    "skin infection on leg"  . Sulfa Antibiotics Rash    Rash all over body/fever  . Hydrocodone Nausea Only    Per patient  MEDICATIONS: Current Outpatient Medications on File Prior to Visit  Medication Sig Dispense Refill  . Acetaminophen (TYLENOL PO) Take 400 mg by mouth 2 (two) times daily.    Marland Kitchen albuterol (PROVENTIL HFA;VENTOLIN HFA) 108 (90 Base) MCG/ACT inhaler Inhale 2 puffs into the lungs every 6 (six) hours as needed for wheezing or shortness of breath. 1 Inhaler 2  . ALPRAZolam (XANAX) 0.25 MG tablet TAKE 1 TABLET BY  MOUTH AT BEDTIME AS NEEDED FOR ANXIETY AND FOR SLEEP 30 tablet 2  . Azelaic Acid (FINACEA) 15 % cream Apply 1 application topically 2 (two) times daily. After skin is thoroughly washed and patted dry, gently but thoroughly massage a thin film of azelaic acid cream into the affected area twice daily, in the morning and evening.    . Cholecalciferol (VITAMIN D3) 25 MCG (1000 UT) CAPS Take by mouth.    . cyclobenzaprine (FLEXERIL) 10 MG tablet Take 1 tablet (10 mg total) by mouth daily. 90 tablet 1  . diclofenac sodium (VOLTAREN) 1 % GEL Apply 3 gm to 3 large joints up to 3 times a day.Dispense 3 tubes with 3 refills. 4 Tube 3  . DULoxetine (CYMBALTA) 60 MG capsule Take 1 capsule (60 mg total) by mouth daily. 90 capsule 2  . estradiol (VIVELLE-DOT) 0.0375 MG/24HR APPLY 1 PATCH TOPICALLY TWICE A WEEK  8  . levothyroxine (SYNTHROID) 25 MCG tablet Take 1 tablet (25 mcg total) by mouth daily before breakfast. 90 tablet 3  . Magnesium 250 MG TABS Take by mouth daily.    . meclizine (ANTIVERT) 12.5 MG tablet Take 1 tablet (12.5 mg total) by mouth 3 (three) times daily as needed for dizziness. 60 tablet 1  . Multiple Vitamin (MULTIVITAMIN) tablet Take 1 tablet by mouth daily.    . pantoprazole (PROTONIX) 40 MG tablet Take 1 tablet (40 mg total) by mouth daily. 90 tablet 3  . propranolol (INDERAL) 40 MG tablet Take 1 tablet (40 mg total) by mouth 2 (two) times daily. 180 tablet 2  . Propylene Glycol (SYSTANE BALANCE OP) Apply to eye.    . vitamin B-12 (CYANOCOBALAMIN) 1000 MCG tablet Take 1,000 mcg by mouth once a week.    . Vitamin D, Ergocalciferol, (DRISDOL) 1.25 MG (50000 UT) CAPS capsule Take 1 capsule (50,000 Units total) by mouth every 7 (seven) days. 12 capsule 0   No current facility-administered medications on file prior to visit.     PAST MEDICAL HISTORY: Past Medical History:  Diagnosis Date  . Anxiety   . Arthritis   . Back pain   . Cancer ([place])    basal and squamous cell skin  .  Chicken pox   . Chronic back pain   . COPD (chronic obstructive pulmonary disease) ([place])   . Depression   . Gallbladder problem   . GERD (gastroesophageal reflux disease)   . Hypertension   . Hypothyroidism   . Joint pain   . Lactose intolerance   . Migraines   . Osteoarthritis   . SOB (shortness of breath)   . Thyroid disease   . Tremor   . Vitamin D deficiency     PAST SURGICAL HISTORY: Past Surgical History:  Procedure Laterality Date  . ABDOMINAL HYSTERECTOMY  1978  . CATARACT EXTRACTION Left 12/20/2017   will have the right one completed a month later   . CHOLECYSTECTOMY  2000  . COLONOSCOPY  2007  . JOINT REPLACEMENT Right 08/19/2017   great toe   . UPPER GASTROINTESTINAL ENDOSCOPY  [id number]    SOCIAL HISTORY: Social History   Tobacco Use  . Smoking status: Former Smoker    Packs/day: 1.50    Years: 25.00    Pack years: 62.50    Quit date: 03/03/1990    Years since quitting: 29.2  . Smokeless tobacco: Never Used  Substance Use Topics  . Alcohol use: Yes    Comment: glass of wine once a month  . Drug use: No    FAMILY HISTORY: Family History  Problem Relation Age of Onset  . Arthritis Mother   . Diabetes Mother   . COPD Mother   . Heart failure Mother   . Stroke Father   . Hypertension Father   . Hyperlipidemia Father   . Diabetes Father   . CAD Father   . Heart disease Father   . Heart failure Father   . Breast cancer Maternal Aunt   . Cancer Sister        bladder cancer   . Throat cancer Brother   . Colon cancer Neg Hx   . Stomach cancer Neg Hx   . Esophageal cancer Neg Hx     ROS: Review of Systems  Constitutional: Positive for weight loss.  Respiratory: Negative for shortness of breath (on exertion).   Cardiovascular: Negative for chest pain.  Neurological: Positive for dizziness.  Endo/Heme/Allergies:       Negative for polyphagia    PHYSICAL EXAM: Blood pressure 99/62, pulse 71, temperature 97.9 F (36.6 C), temperature  source Oral, height 5\' 2"  (1.575 m), weight 165 lb (74.8 kg), SpO2 97 %. Body mass index is 30.18 kg/m. Physical Exam Vitals signs reviewed.  Constitutional:      Appearance: Normal appearance. She is well-developed. She is obese.  Cardiovascular:     Rate and Rhythm: Normal rate.  Pulmonary:     Effort: Pulmonary effort is normal.  Musculoskeletal: Normal range of motion.  Skin:    General: Skin is warm and dry.  Neurological:     Mental Status: She is alert and oriented to person, place, and time.  Psychiatric:        Mood and Affect: Mood normal.        Behavior: Behavior normal.     RECENT LABS AND TESTS: BMET    Component Value Date/Time   NA 135 03/26/2019 1058   K 4.3 03/26/2019 1058   CL 100 03/26/2019 1058   CO2 21 03/26/2019 1058   GLUCOSE 93 03/26/2019 1058   GLUCOSE 101 (H) 05/19/2018 1044   BUN 10 03/26/2019 1058   CREATININE 0.89 03/26/2019 1058   CREATININE 0.91 09/09/2017 0959   CALCIUM 8.9 03/26/2019 1058   GFRNONAA 67 03/26/2019 1058   GFRNONAA 65 09/09/2017 0959   GFRAA 77 03/26/2019 1058   GFRAA 76 09/09/2017 0959   Lab Results  Component Value Date   HGBA1C 5.6 03/26/2019   HGBA1C 5.7 05/19/2018   Lab Results  Component Value Date   INSULIN 11.6 03/26/2019   CBC    Component Value Date/Time   WBC 11.6 (H) 01/18/2018 1400   RBC 4.69 01/18/2018 1400   HGB 14.6 01/18/2018 1400   HCT 42.8 01/18/2018 1400   PLT 308 01/18/2018 1400   MCV 91.3 01/18/2018 1400   MCH 31.1 01/18/2018 1400   MCHC 34.1 01/18/2018 1400   RDW 13.1 01/18/2018 1400   LYMPHSABS 1.0 01/18/2018 1400   MONOABS 0.5 01/18/2018 1400   EOSABS 0.1 01/18/2018 1400   BASOSABS 0.0 01/18/2018 1400  Iron/TIBC/Ferritin/ %Sat No results found for: IRON, TIBC, FERRITIN, IRONPCTSAT Lipid Panel     Component Value Date/Time   CHOL 256 (H) 03/26/2019 1058   TRIG 141 03/26/2019 1058   HDL 44 03/26/2019 1058   CHOLHDL 4.2 08/04/2017 0830   LDLCALC 184 (H) 03/26/2019 1058    Hepatic Function Panel     Component Value Date/Time   PROT 6.5 03/26/2019 1058   ALBUMIN 3.9 03/26/2019 1058   AST 23 03/26/2019 1058   ALT 22 03/26/2019 1058   ALKPHOS 81 03/26/2019 1058   BILITOT 0.3 03/26/2019 1058      Component Value Date/Time   TSH 2.170 03/26/2019 1058   TSH 1.86 05/19/2018 1044   TSH 5.040 (H) 08/04/2017 0830     Ref. Range 03/26/2019 10:58  Vitamin D, 25-Hydroxy Latest Ref Range: 30.0 - 100.0 ng/mL 39.1    OBESITY BEHAVIORAL INTERVENTION VISIT  Today's visit was # 6   Starting weight: 179 lbs Starting date: 03/26/2019 Today's weight : 165 lbs Today's date: 06/12/2019 Total lbs lost to date: 14    06/12/2019  Height 5\' 2"  (1.575 m)  Weight 165 lb (74.8 kg)  BMI (Calculated) 30.17  BLOOD PRESSURE - SYSTOLIC 99  BLOOD PRESSURE - DIASTOLIC 62   Body Fat % 0000000 %  Total Body Water (lbs) 66 lbs    ASK: We discussed the diagnosis of obesity with Bailey Mech today and Shelsie agreed to give [place] permission to discuss obesity behavioral modification therapy today.  ASSESS: Hilah has the diagnosis of obesity and her BMI today is 30.17 Shanada is in the action stage of change   ADVISE: Careena was educated on the multiple health risks of obesity as well as the benefit of weight loss to improve her health. She was advised of the need for long term treatment and the importance of lifestyle modifications to improve her current health and to decrease her risk of future health problems.  AGREE: Multiple dietary modification options and treatment options were discussed and  Olivia agreed to follow the recommendations documented in the above note.  ARRANGE: Adiba was educated on the importance of frequent visits to treat obesity as outlined per CMS and USPSTF guidelines and agreed to schedule her next follow up appointment today.  Corey Skains, am acting as Location manager for [employer], FNP-C.  I have reviewed the above documentation for accuracy and  completeness, and I agree with the above.  - Neveen Daponte, FNP-C.

## 2019-06-14 ENCOUNTER — Encounter (INDEPENDENT_AMBULATORY_CARE_PROVIDER_SITE_OTHER): Payer: Self-pay | Admitting: Family Medicine

## 2019-06-14 DIAGNOSIS — E8881 Metabolic syndrome: Secondary | ICD-10-CM | POA: Insufficient documentation

## 2019-06-14 DIAGNOSIS — E88819 Insulin resistance, unspecified: Secondary | ICD-10-CM | POA: Insufficient documentation

## 2019-06-18 DIAGNOSIS — L814 Other melanin hyperpigmentation: Secondary | ICD-10-CM | POA: Diagnosis not present

## 2019-06-18 DIAGNOSIS — D1800 Hemangioma unspecified site: Secondary | ICD-10-CM | POA: Diagnosis not present

## 2019-06-18 DIAGNOSIS — Z1283 Encounter for screening for malignant neoplasm of skin: Secondary | ICD-10-CM | POA: Diagnosis not present

## 2019-06-18 DIAGNOSIS — D225 Melanocytic nevi of trunk: Secondary | ICD-10-CM | POA: Diagnosis not present

## 2019-06-18 DIAGNOSIS — L821 Other seborrheic keratosis: Secondary | ICD-10-CM | POA: Diagnosis not present

## 2019-06-18 DIAGNOSIS — D692 Other nonthrombocytopenic purpura: Secondary | ICD-10-CM | POA: Diagnosis not present

## 2019-06-18 DIAGNOSIS — L578 Other skin changes due to chronic exposure to nonionizing radiation: Secondary | ICD-10-CM | POA: Diagnosis not present

## 2019-06-18 DIAGNOSIS — I781 Nevus, non-neoplastic: Secondary | ICD-10-CM | POA: Diagnosis not present

## 2019-06-18 DIAGNOSIS — L820 Inflamed seborrheic keratosis: Secondary | ICD-10-CM | POA: Diagnosis not present

## 2019-06-18 DIAGNOSIS — D2261 Melanocytic nevi of right upper limb, including shoulder: Secondary | ICD-10-CM | POA: Diagnosis not present

## 2019-06-18 DIAGNOSIS — Z85828 Personal history of other malignant neoplasm of skin: Secondary | ICD-10-CM | POA: Diagnosis not present

## 2019-06-25 ENCOUNTER — Other Ambulatory Visit: Payer: Self-pay | Admitting: Family Medicine

## 2019-06-25 DIAGNOSIS — G4700 Insomnia, unspecified: Secondary | ICD-10-CM

## 2019-06-25 NOTE — Telephone Encounter (Signed)
Last filled 04/10/2019 Last OV 12/20/2018

## 2019-06-26 ENCOUNTER — Other Ambulatory Visit: Payer: Self-pay | Admitting: Family Medicine

## 2019-06-26 DIAGNOSIS — K219 Gastro-esophageal reflux disease without esophagitis: Secondary | ICD-10-CM

## 2019-07-03 ENCOUNTER — Telehealth (INDEPENDENT_AMBULATORY_CARE_PROVIDER_SITE_OTHER): Payer: PPO | Admitting: Family Medicine

## 2019-07-03 ENCOUNTER — Other Ambulatory Visit: Payer: Self-pay

## 2019-07-03 DIAGNOSIS — Z6830 Body mass index (BMI) 30.0-30.9, adult: Secondary | ICD-10-CM

## 2019-07-03 DIAGNOSIS — I10 Essential (primary) hypertension: Secondary | ICD-10-CM | POA: Diagnosis not present

## 2019-07-03 DIAGNOSIS — E669 Obesity, unspecified: Secondary | ICD-10-CM

## 2019-07-09 NOTE — Progress Notes (Signed)
Office: [phone]  /  Fax: [phone] TeleHealth Visit:  Kristina [name] has verbally consented to this TeleHealth visit today. The patient is located at home, the provider is located at the [facility] office. The participants in this visit include the listed provider and patient and any and all parties involved. The visit was conducted today via telephone. [name] was unable to use realtime audiovisual technology today and the telehealth visit was conducted via telephone (time spent on call 24 minutes, 11:19-11:43).  HPI:   Chief Complaint: OBESITY Kristina [name] is here to discuss her progress with her obesity treatment plan. She is on the Category 1 plan and is following her eating plan approximately 85 % of the time. She states she is walking 60 minutes 5 times per week. Kristina [name] is moving prescribed food around during the day to better suit her needs and she sometimes has breakfast for dinner. Kristina [name] lives alone and she does not cook much. We were unable to weigh the patient today for this TeleHealth visit. She feels as if she has lost weight since her last visit (weight 162 lbs 07/03/19). She has lost 14 lbs since starting treatment with [place].  Hypertension Kristina [name] is a 68 y.o. female with hypertension. Her blood pressure was low at the last visit. It is 123/68 today at home. Kristina [name] is on Losartan 25 mg daily (reduced from 50 mg daily at the last visit), due to hypotension. Kristina [name] denies chest pain or shortness of breath on exertion. She is working weight loss to help control her blood pressure with the goal of decreasing her risk of heart attack and stroke. Kristina [name] blood pressure is currently controlled. BP Readings from Last 3 Encounters:  06/12/19 99/62  05/24/19 113/64  05/10/19 108/60     ASSESSMENT AND PLAN:  Essential hypertension  Class 1 obesity with serious comorbidity and body mass index (BMI) of 30.0 to 30.9 in adult, unspecified obesity  type  PLAN:  Hypertension We discussed sodium restriction, working on healthy weight loss, and a regular exercise program as the means to achieve improved blood pressure control. Kristina [name] agreed with this plan and agreed to follow up as directed. We will continue to monitor her blood pressure as well as her progress with the above lifestyle modifications. Kristina [name] will continue Losartan 25 mg daily and she will watch for signs of hypotension as she continues her lifestyle modifications.  Obesity Kristina [name] is currently in the action stage of change. As such, her goal is to continue with weight loss efforts She has agreed to follow the Category 1 plan Kristina [name] will continue her current exercise regimen per week for weight loss and overall health benefits. We discussed the following Behavioral Modification Strategies today: planning for success, increasing lean protein intake, decreasing simple carbohydrates, work on meal planning and easy cooking plans and holiday eating strategies   Kristina [name] may have a microwave meal at dinner and add milk or a greek yogurt for extra protein.  Kristina [name] has agreed to follow up with our clinic in 3 weeks. She was informed of the importance of frequent follow up visits to maximize her success with intensive lifestyle modifications for her multiple health conditions.  ALLERGIES: Allergies  Allergen Reactions   Meloxicam Other (See Comments)    "skin infection on leg"   Sulfa Antibiotics Rash    Rash all over body/fever   Hydrocodone Nausea Only    Per patient     MEDICATIONS: Current Outpatient Medications on File Prior to  Visit  Medication Sig Dispense Refill   Acetaminophen (TYLENOL PO) Take 400 mg by mouth 2 (two) times daily.     albuterol (PROVENTIL HFA;VENTOLIN HFA) 108 (90 Base) MCG/ACT inhaler Inhale 2 puffs into the lungs every 6 (six) hours as needed for wheezing or shortness of breath. 1 Inhaler 2   ALPRAZolam (XANAX) 0.25 MG tablet TAKE 1 TABLET BY MOUTH  AT BEDTIME AS NEEDED FOR ANXIETY AND FOR SLEEP 30 tablet 3   Azelaic Acid (FINACEA) 15 % cream Apply 1 application topically 2 (two) times daily. After skin is thoroughly washed and patted dry, gently but thoroughly massage a thin film of azelaic acid cream into the affected area twice daily, in the morning and evening.     Cholecalciferol (VITAMIN D3) 25 MCG (1000 UT) CAPS Take by mouth.     cyclobenzaprine (FLEXERIL) 10 MG tablet Take 1 tablet by mouth once daily 90 tablet 1   diclofenac sodium (VOLTAREN) 1 % GEL Apply 3 gm to 3 large joints up to 3 times a day.Dispense 3 tubes with 3 refills. 4 Tube 3   DULoxetine (CYMBALTA) 60 MG capsule Take 1 capsule (60 mg total) by mouth daily. 90 capsule 2   estradiol (VIVELLE-DOT) 0.0375 MG/24HR APPLY 1 PATCH TOPICALLY TWICE A WEEK  8   levothyroxine (SYNTHROID) 25 MCG tablet Take 1 tablet (25 mcg total) by mouth daily before breakfast. 90 tablet 3   losartan (COZAAR) 50 MG tablet Take 0.5 tablets (25 mg total) by mouth daily. 90 tablet 0   Magnesium 250 MG TABS Take by mouth daily.     meclizine (ANTIVERT) 12.5 MG tablet Take 1 tablet (12.5 mg total) by mouth 3 (three) times daily as needed for dizziness. 60 tablet 1   Multiple Vitamin (MULTIVITAMIN) tablet Take 1 tablet by mouth daily.     pantoprazole (PROTONIX) 40 MG tablet Take 1 tablet by mouth once daily 90 tablet 2   propranolol (INDERAL) 40 MG tablet Take 1 tablet (40 mg total) by mouth 2 (two) times daily. 180 tablet 2   Propylene Glycol (SYSTANE BALANCE OP) Apply to eye.     vitamin B-12 (CYANOCOBALAMIN) 1000 MCG tablet Take 1,000 mcg by mouth once a week.     Vitamin D, Ergocalciferol, (DRISDOL) 1.25 MG (50000 UT) CAPS capsule Take 1 capsule (50,000 Units total) by mouth every 7 (seven) days. 12 capsule 0   No current facility-administered medications on file prior to visit.     PAST MEDICAL HISTORY: Past Medical History:  Diagnosis Date   Anxiety    Arthritis     Back pain    Cancer ([place])    basal and squamous cell skin   Chicken pox    Chronic back pain    COPD (chronic obstructive pulmonary disease) ([facility])    Depression    Gallbladder problem    GERD (gastroesophageal reflux disease)    Hypertension    Hypothyroidism    Joint pain    Lactose intolerance    Migraines    Osteoarthritis    SOB (shortness of breath)    Thyroid disease    Tremor    Vitamin D deficiency     PAST SURGICAL HISTORY: Past Surgical History:  Procedure Laterality Date   ABDOMINAL HYSTERECTOMY  1978   CATARACT EXTRACTION Left 12/20/2017   will have the right one completed a month later    CHOLECYSTECTOMY  2000   COLONOSCOPY  2007   JOINT REPLACEMENT Right 08/19/2017   great  toe    UPPER GASTROINTESTINAL ENDOSCOPY  [id number]    SOCIAL HISTORY: Social History   Tobacco Use   Smoking status: Former Smoker    Packs/day: 1.50    Years: 25.00    Pack years: 37.50    Quit date: 03/03/1990    Years since quitting: 29.3   Smokeless tobacco: Never Used  Substance Use Topics   Alcohol use: Yes    Comment: glass of wine once a month   Drug use: No    FAMILY HISTORY: Family History  Problem Relation Age of Onset   Arthritis Mother    Diabetes Mother    COPD Mother    Heart failure Mother    Stroke Father    Hypertension Father    Hyperlipidemia Father    Diabetes Father    CAD Father    Heart disease Father    Heart failure Father    Breast cancer Maternal Aunt    Cancer Sister        bladder cancer    Throat cancer Brother    Colon cancer Neg Hx    Stomach cancer Neg Hx    Esophageal cancer Neg Hx     ROS: Review of Systems  Constitutional: Positive for weight loss.  Respiratory: Negative for shortness of breath (on exertion).   Cardiovascular: Negative for chest pain.    PHYSICAL EXAM: Pt in no acute distress  RECENT LABS AND TESTS: BMET    Component Value Date/Time   NA 135 03/26/2019  1058   K 4.3 03/26/2019 1058   CL 100 03/26/2019 1058   CO2 21 03/26/2019 1058   GLUCOSE 93 03/26/2019 1058   GLUCOSE 101 (H) 05/19/2018 1044   BUN 10 03/26/2019 1058   CREATININE 0.89 03/26/2019 1058   CREATININE 0.91 09/09/2017 0959   CALCIUM 8.9 03/26/2019 1058   GFRNONAA 67 03/26/2019 1058   GFRNONAA 65 09/09/2017 0959   GFRAA 77 03/26/2019 1058   GFRAA 76 09/09/2017 0959   Lab Results  Component Value Date   HGBA1C 5.6 03/26/2019   HGBA1C 5.7 05/19/2018   Lab Results  Component Value Date   INSULIN 11.6 03/26/2019   CBC    Component Value Date/Time   WBC 11.6 (H) 01/18/2018 1400   RBC 4.69 01/18/2018 1400   HGB 14.6 01/18/2018 1400   HCT 42.8 01/18/2018 1400   PLT 308 01/18/2018 1400   MCV 91.3 01/18/2018 1400   MCH 31.1 01/18/2018 1400   MCHC 34.1 01/18/2018 1400   RDW 13.1 01/18/2018 1400   LYMPHSABS 1.0 01/18/2018 1400   MONOABS 0.5 01/18/2018 1400   EOSABS 0.1 01/18/2018 1400   BASOSABS 0.0 01/18/2018 1400   Iron/TIBC/Ferritin/ %Sat No results found for: IRON, TIBC, FERRITIN, IRONPCTSAT Lipid Panel     Component Value Date/Time   CHOL 256 (H) 03/26/2019 1058   TRIG 141 03/26/2019 1058   HDL 44 03/26/2019 1058   CHOLHDL 4.2 08/04/2017 0830   LDLCALC 184 (H) 03/26/2019 1058   Hepatic Function Panel     Component Value Date/Time   PROT 6.5 03/26/2019 1058   ALBUMIN 3.9 03/26/2019 1058   AST 23 03/26/2019 1058   ALT 22 03/26/2019 1058   ALKPHOS 81 03/26/2019 1058   BILITOT 0.3 03/26/2019 1058      Component Value Date/Time   TSH 2.170 03/26/2019 1058   TSH 1.86 05/19/2018 1044   TSH 5.040 (H) 08/04/2017 0830     Ref. Range 03/26/2019 10:58  Vitamin D, 25-Hydroxy  Latest Ref Range: 30.0 - 100.0 ng/mL 39.1    I, Doreene Nest, am acting as Location manager for [employer], FNP-C  I have reviewed the above documentation for accuracy and completeness, and I agree with the above.  - Kaityln Kallstrom, FNP-C.

## 2019-07-12 ENCOUNTER — Other Ambulatory Visit: Payer: Self-pay | Admitting: Family Medicine

## 2019-07-17 ENCOUNTER — Other Ambulatory Visit: Payer: Self-pay | Admitting: *Deleted

## 2019-07-17 NOTE — Patient Outreach (Signed)
[name]  07/17/2019  Kristina [name] 1951-07-27 [id number]   Case reviewed, no patient outreach needed,  and case closed per [name], Assistant Clinical Director at [place]  request.   [name]. [name], BSN, [place] Management [facility] Telephonic CM Phone: [phone] Fax: [phone]

## 2019-07-24 ENCOUNTER — Encounter (INDEPENDENT_AMBULATORY_CARE_PROVIDER_SITE_OTHER): Payer: Self-pay | Admitting: Family Medicine

## 2019-07-24 ENCOUNTER — Ambulatory Visit (INDEPENDENT_AMBULATORY_CARE_PROVIDER_SITE_OTHER): Payer: PPO | Admitting: Family Medicine

## 2019-07-24 ENCOUNTER — Other Ambulatory Visit: Payer: Self-pay

## 2019-07-24 VITALS — BP 107/67 | HR 69 | Temp 97.9°F | Ht 62.0 in | Wt 160.0 lb

## 2019-07-24 DIAGNOSIS — E038 Other specified hypothyroidism: Secondary | ICD-10-CM

## 2019-07-24 DIAGNOSIS — Z6830 Body mass index (BMI) 30.0-30.9, adult: Secondary | ICD-10-CM

## 2019-07-24 DIAGNOSIS — E559 Vitamin D deficiency, unspecified: Secondary | ICD-10-CM

## 2019-07-24 DIAGNOSIS — R7303 Prediabetes: Secondary | ICD-10-CM

## 2019-07-24 DIAGNOSIS — E7849 Other hyperlipidemia: Secondary | ICD-10-CM

## 2019-07-24 DIAGNOSIS — E669 Obesity, unspecified: Secondary | ICD-10-CM | POA: Diagnosis not present

## 2019-07-24 DIAGNOSIS — E66811 Obesity, class 1: Secondary | ICD-10-CM

## 2019-07-24 MED ORDER — VITAMIN D (ERGOCALCIFEROL) 1.25 MG (50000 UNIT) PO CAPS: 50000.0000 [IU] | ORAL_CAPSULE | ORAL | 0 refills | Status: DC

## 2019-07-25 LAB — HEMOGLOBIN A1C
Est. average glucose Bld gHb Est-mCnc: 105 mg/dL
Hgb A1c MFr Bld: 5.3 % (ref 4.8–5.6)

## 2019-07-25 LAB — TSH: TSH: 1.97 u[IU]/mL (ref 0.450–4.500)

## 2019-07-25 LAB — T4, FREE: Free T4: 1.39 ng/dL (ref 0.82–1.77)

## 2019-07-25 LAB — COMPREHENSIVE METABOLIC PANEL
ALT: 41 IU/L — ABNORMAL HIGH (ref 0–32)
AST: 28 IU/L (ref 0–40)
Albumin/Globulin Ratio: 1.7 (ref 1.2–2.2)
Albumin: 4 g/dL (ref 3.8–4.8)
Alkaline Phosphatase: 95 IU/L (ref 39–117)
BUN/Creatinine Ratio: 23 (ref 12–28)
BUN: 23 mg/dL (ref 8–27)
Bilirubin Total: 0.4 mg/dL (ref 0.0–1.2)
CO2: 20 mmol/L (ref 20–29)
Calcium: 9 mg/dL (ref 8.7–10.3)
Chloride: 102 mmol/L (ref 96–106)
Creatinine, Ser: 0.98 mg/dL (ref 0.57–1.00)
GFR calc Af Amer: 68 mL/min/{1.73_m2} (ref >59)
GFR calc non Af Amer: 59 mL/min/{1.73_m2} — ABNORMAL LOW (ref >59)
Globulin, Total: 2.4 g/dL (ref 1.5–4.5)
Glucose: 97 mg/dL (ref 65–99)
Potassium: 4.5 mmol/L (ref 3.5–5.2)
Sodium: 136 mmol/L (ref 134–144)
Total Protein: 6.4 g/dL (ref 6.0–8.5)

## 2019-07-25 LAB — LIPID PANEL WITH LDL/HDL RATIO
Cholesterol, Total: 213 mg/dL — ABNORMAL HIGH (ref 100–199)
HDL: 43 mg/dL (ref >39)
LDL Chol Calc (NIH): 152 mg/dL — ABNORMAL HIGH (ref 0–99)
LDL/HDL Ratio: 3.5 ratio — ABNORMAL HIGH (ref 0.0–3.2)
Triglycerides: 101 mg/dL (ref 0–149)
VLDL Cholesterol Cal: 18 mg/dL (ref 5–40)

## 2019-07-25 LAB — T3: T3, Total: 89 ng/dL (ref 71–180)

## 2019-07-25 LAB — INSULIN, RANDOM: INSULIN: 5.2 u[IU]/mL (ref 2.6–24.9)

## 2019-07-25 LAB — VITAMIN D 25 HYDROXY (VIT D DEFICIENCY, FRACTURES): Vit D, 25-Hydroxy: 64.9 ng/mL (ref 30.0–100.0)

## 2019-07-26 ENCOUNTER — Encounter (INDEPENDENT_AMBULATORY_CARE_PROVIDER_SITE_OTHER): Payer: Self-pay | Admitting: Family Medicine

## 2019-07-26 DIAGNOSIS — R7303 Prediabetes: Secondary | ICD-10-CM | POA: Insufficient documentation

## 2019-07-26 NOTE — Progress Notes (Signed)
Office: [phone]  /  Fax: [phone]   HPI:  Chief Complaint: OBESITY Kristina [name] is here to discuss her progress with her obesity treatment plan. She is on the Category 1 plan and states she is following her eating plan approximately 80 % of the time. She states she is walking and biking 60 to 90 minutes 3 to 4 times per week.  Kristina [name] did well on the plan, despite having two Thanksgiving meals. She is getting all of the protein in . Kristina [name] is bored with lunch and dinner. She tends to have her largest meal at lunch.  Vitamin D deficiency Kristina [name] has a diagnosis of vitamin D deficiency. Her last vitamin D level was at 39.1 and was not at goal. She denies fatigue.     Hypothyroidism Kristina [name] has a diagnosis of hypothyroidism. She denies heat or cold intolerance or palpitations. Kristina [name] reports some hair loss. She is stable on levothyroxine 25 mcg daily.   Hyperlipidemia Kristina [name] has hyperlipidemia and her last LDL was very high at 184. Her HDL was low at 44 and triglycerides were normal. She is not on a statin.   Pre-Diabetes Kristina [name] has a diagnosis of prediabetes and her last A1c was at 5.6 (03/26/19). She admits to cravings and denies polyphagia.    Today's visit was # 8  Starting weight: 179 lbs Starting date: 03/26/2019 Today's weight : 160 lbs Today's date: 07/24/2019 Total lbs lost to date: 19 Total lbs lost since last in-office visit: 5  ASSESSMENT AND PLAN:  Vitamin D deficiency - Plan: Vitamin D, Ergocalciferol, (DRISDOL) 1.25 MG (50000 UT) CAPS capsule, Vitamin D (25 hydroxy)  Other specified hypothyroidism - Plan: Comprehensive metabolic panel, T4, free, T3, TSH  Other hyperlipidemia - Plan: Lipid Panel With LDL/HDL Ratio  Prediabetes - Plan: Hemoglobin A1c, Insulin, random  Class 1 obesity with serious comorbidity and body mass index (BMI) of 30.0 to 30.9 in adult, unspecified obesity type - BMI greater than 30 at start of program   PLAN:  Vitamin D Deficiency Low vitamin  D level contributes to fatigue and are associated with obesity, breast, and colon cancer. Kristina [name] agrees to continue to take prescription Vit D @50 ,000 IU every week #4 with no refills and she will follow up for routine testing of vitamin D, at least 2-3 times per year to avoid over-replacement. We will check vitamin D level today and Kristina [name] agrees to follow up as directed.  Hypothyroidism Kristina [name] was informed of the importance of good thyroid control for overall health and that supertherapeutic thyroid levels are dangerous and will not improve weight loss results. Kristina [name] will continue 25 mcg of levothyroxine daily. We will check thyroid panel and will continue to monitor.  Hyperlipidemia Intensive lifestyle modifications as the first line treatment for hyperlipidemia. We discussed many lifestyle modifications today and Kristina [name] will continue to work on diet, exercise and weight loss efforts. We will check fasting lipid panel today.  Pre-Diabetes Kristina [name] will continue to work on weight loss, exercise, and decreasing simple carbohydrates to help decrease the risk of diabetes. We will check fasting insulin, glucose and A1c today.  Obesity Kristina [name] is currently in the action stage of change. As such, her goal is to continue with weight loss efforts She has agreed to follow the Category 1 plan Kristina [name] will continue walking and biking for 60 to 90 minutes, 3 to 4 times per week for weight loss and overall health benefits, and she will increase the intensity of her workout. Kristina [name] will add hand  weights 2 times per week. We discussed the following Behavioral Modification Strategies today: planning for success  Kristina [name] has agreed to follow up with our clinic in 3 weeks. She was informed of the importance of frequent follow up visits to maximize her success with intensive lifestyle modifications for her multiple health conditions.  ALLERGIES: Allergies  Allergen Reactions  . Meloxicam Other (See Comments)    "skin  infection on leg"  . Sulfa Antibiotics Rash    Rash all over body/fever  . Hydrocodone Nausea Only    Per patient     MEDICATIONS: Current Outpatient Medications on File Prior to Visit  Medication Sig Dispense Refill  . Acetaminophen (TYLENOL PO) Take 400 mg by mouth 2 (two) times daily.    Marland Kitchen albuterol (PROVENTIL HFA;VENTOLIN HFA) 108 (90 Base) MCG/ACT inhaler Inhale 2 puffs into the lungs every 6 (six) hours as needed for wheezing or shortness of breath. 1 Inhaler 2  . ALPRAZolam (XANAX) 0.25 MG tablet TAKE 1 TABLET BY MOUTH AT BEDTIME AS NEEDED FOR ANXIETY AND FOR SLEEP 30 tablet 3  . Azelaic Acid (FINACEA) 15 % cream Apply 1 application topically 2 (two) times daily. After skin is thoroughly washed and patted dry, gently but thoroughly massage a thin film of azelaic acid cream into the affected area twice daily, in the morning and evening.    . Cholecalciferol (VITAMIN D3) 25 MCG (1000 UT) CAPS Take by mouth.    . cyclobenzaprine (FLEXERIL) 10 MG tablet Take 1 tablet by mouth once daily 90 tablet 0  . diclofenac sodium (VOLTAREN) 1 % GEL Apply 3 gm to 3 large joints up to 3 times a day.Dispense 3 tubes with 3 refills. 4 Tube 3  . DULoxetine (CYMBALTA) 60 MG capsule Take 1 capsule (60 mg total) by mouth daily. 90 capsule 2  . estradiol (VIVELLE-DOT) 0.0375 MG/24HR APPLY 1 PATCH TOPICALLY TWICE A WEEK  8  . levothyroxine (SYNTHROID) 25 MCG tablet Take 1 tablet (25 mcg total) by mouth daily before breakfast. 90 tablet 3  . losartan (COZAAR) 50 MG tablet Take 0.5 tablets (25 mg total) by mouth daily. 90 tablet 0  . Magnesium 250 MG TABS Take by mouth daily.    . meclizine (ANTIVERT) 12.5 MG tablet Take 1 tablet (12.5 mg total) by mouth 3 (three) times daily as needed for dizziness. 60 tablet 1  . Multiple Vitamin (MULTIVITAMIN) tablet Take 1 tablet by mouth daily.    . pantoprazole (PROTONIX) 40 MG tablet Take 1 tablet by mouth once daily 90 tablet 2  . propranolol (INDERAL) 40 MG tablet Take  1 tablet (40 mg total) by mouth 2 (two) times daily. 180 tablet 2  . Propylene Glycol (SYSTANE BALANCE OP) Apply to eye.    . vitamin B-12 (CYANOCOBALAMIN) 1000 MCG tablet Take 1,000 mcg by mouth once a week.     No current facility-administered medications on file prior to visit.    PAST MEDICAL HISTORY: Past Medical History:  Diagnosis Date  . Anxiety   . Arthritis   . Back pain   . Cancer ([place])    basal and squamous cell skin  . Chicken pox   . Chronic back pain   . COPD (chronic obstructive pulmonary disease) ([place])   . Depression   . Gallbladder problem   . GERD (gastroesophageal reflux disease)   . Hypertension   . Hypothyroidism   . Joint pain   . Lactose intolerance   . Migraines   . Osteoarthritis   .  SOB (shortness of breath)   . Thyroid disease   . Tremor   . Vitamin D deficiency     PAST SURGICAL HISTORY: Past Surgical History:  Procedure Laterality Date  . ABDOMINAL HYSTERECTOMY  1978  . CATARACT EXTRACTION Left 12/20/2017   will have the right one completed a month later   . CHOLECYSTECTOMY  2000  . COLONOSCOPY  2007  . JOINT REPLACEMENT Right 08/19/2017   great toe   . UPPER GASTROINTESTINAL ENDOSCOPY  [id number]    SOCIAL HISTORY: Social History   Tobacco Use  . Smoking status: Former Smoker    Packs/day: 1.50    Years: 25.00    Pack years: 6.50    Quit date: 03/03/1990    Years since quitting: 29.4  . Smokeless tobacco: Never Used  Substance Use Topics  . Alcohol use: Yes    Comment: glass of wine once a month  . Drug use: No    FAMILY HISTORY: Family History  Problem Relation Age of Onset  . Arthritis Mother   . Diabetes Mother   . COPD Mother   . Heart failure Mother   . Stroke Father   . Hypertension Father   . Hyperlipidemia Father   . Diabetes Father   . CAD Father   . Heart disease Father   . Heart failure Father   . Breast cancer Maternal Aunt   . Cancer Sister        bladder cancer   . Throat cancer Brother   .  Colon cancer Neg Hx   . Stomach cancer Neg Hx   . Esophageal cancer Neg Hx     ROS: Review of Systems  Constitutional: Positive for weight loss. Negative for malaise/fatigue.  Cardiovascular: Negative for palpitations.  Skin:       Positive for hair loss  Endo/Heme/Allergies:       Negative for heat or cold intolerance Positive for cravings Negative for polyphagia    PHYSICAL EXAM: Blood pressure 107/67, pulse 69, temperature 97.9 F (36.6 C), temperature source Oral, height 5\' 2"  (1.575 m), weight 160 lb (72.6 kg), SpO2 98 %. Body mass index is 29.26 kg/m. Physical Exam Vitals reviewed.  Constitutional:      General: She is not in acute distress.    Appearance: Normal appearance. She is well-developed. She is obese.  Cardiovascular:     Rate and Rhythm: Normal rate.  Pulmonary:     Effort: Pulmonary effort is normal.  Musculoskeletal:        General: Normal range of motion.  Skin:    General: Skin is warm and dry.  Neurological:     Mental Status: She is alert and oriented to person, place, and time.  Psychiatric:        Mood and Affect: Mood normal.        Behavior: Behavior normal.     RECENT LABS AND TESTS: BMET    Component Value Date/Time   NA 136 07/24/2019 1115   K 4.5 07/24/2019 1115   CL 102 07/24/2019 1115   CO2 20 07/24/2019 1115   GLUCOSE 97 07/24/2019 1115   GLUCOSE 101 (H) 05/19/2018 1044   BUN 23 07/24/2019 1115   CREATININE 0.98 07/24/2019 1115   CREATININE 0.91 09/09/2017 0959   CALCIUM 9.0 07/24/2019 1115   GFRNONAA 59 (L) 07/24/2019 1115   GFRNONAA 65 09/09/2017 0959   GFRAA 68 07/24/2019 1115   GFRAA 76 09/09/2017 0959   Lab Results  Component Value Date  HGBA1C 5.3 07/24/2019   HGBA1C 5.6 03/26/2019   HGBA1C 5.7 05/19/2018   Lab Results  Component Value Date   INSULIN 5.2 07/24/2019   INSULIN 11.6 03/26/2019   CBC    Component Value Date/Time   WBC 11.6 (H) 01/18/2018 1400   RBC 4.69 01/18/2018 1400   HGB 14.6  01/18/2018 1400   HCT 42.8 01/18/2018 1400   PLT 308 01/18/2018 1400   MCV 91.3 01/18/2018 1400   MCH 31.1 01/18/2018 1400   MCHC 34.1 01/18/2018 1400   RDW 13.1 01/18/2018 1400   LYMPHSABS 1.0 01/18/2018 1400   MONOABS 0.5 01/18/2018 1400   EOSABS 0.1 01/18/2018 1400   BASOSABS 0.0 01/18/2018 1400   Iron/TIBC/Ferritin/ %Sat No results found for: IRON, TIBC, FERRITIN, IRONPCTSAT Lipid Panel     Component Value Date/Time   CHOL 213 (H) 07/24/2019 1115   TRIG 101 07/24/2019 1115   HDL 43 07/24/2019 1115   CHOLHDL 4.2 08/04/2017 0830   LDLCALC 152 (H) 07/24/2019 1115   Hepatic Function Panel     Component Value Date/Time   PROT 6.4 07/24/2019 1115   ALBUMIN 4.0 07/24/2019 1115   AST 28 07/24/2019 1115   ALT 41 (H) 07/24/2019 1115   ALKPHOS 95 07/24/2019 1115   BILITOT 0.4 07/24/2019 1115      Component Value Date/Time   TSH 1.970 07/24/2019 1115   TSH 2.170 03/26/2019 1058   TSH 1.86 05/19/2018 1044     OBESITY BEHAVIORAL INTERVENTION VISIT DOCUMENTATION FOR INSURANCE (~15 minutes)    ASK: We discussed the diagnosis of obesity with Kristina [name] today and Kristina [name] agreed to give [place] permission to discuss obesity behavioral modification therapy today.  ASSESS: Jamilyn has the diagnosis of obesity and her BMI today is 29.26 Trischa is in the action stage of change   ADVISE: Kristina [name] was educated on the multiple health risks of obesity as well as the benefit of weight loss to improve her health. She was advised of the need for long term treatment and the importance of lifestyle modifications to improve her current health and to decrease her risk of future health problems.  AGREE: Multiple dietary modification options and treatment options were discussed and  Kristina [name] agreed to follow the recommendations documented in the above note.  ARRANGE: Kristina [name] was educated on the importance of frequent visits to treat obesity as outlined per CMS and USPSTF guidelines and agreed to schedule  her next follow up appointment today.   I, Doreene Nest, am acting as transcriptionist for [employer], FNP-C  I have reviewed the above documentation for accuracy and completeness, and I agree with the above.  - Liliah Dorian, FNP-C.

## 2019-08-21 ENCOUNTER — Encounter (INDEPENDENT_AMBULATORY_CARE_PROVIDER_SITE_OTHER): Payer: Self-pay | Admitting: Family Medicine

## 2019-08-21 ENCOUNTER — Other Ambulatory Visit: Payer: Self-pay

## 2019-08-21 ENCOUNTER — Ambulatory Visit (INDEPENDENT_AMBULATORY_CARE_PROVIDER_SITE_OTHER): Payer: PPO | Admitting: Family Medicine

## 2019-08-21 VITALS — BP 106/64 | HR 65 | Temp 98.4°F | Ht 62.0 in | Wt 158.0 lb

## 2019-08-21 DIAGNOSIS — I10 Essential (primary) hypertension: Secondary | ICD-10-CM

## 2019-08-21 DIAGNOSIS — E669 Obesity, unspecified: Secondary | ICD-10-CM | POA: Diagnosis not present

## 2019-08-21 DIAGNOSIS — Z6830 Body mass index (BMI) 30.0-30.9, adult: Secondary | ICD-10-CM | POA: Diagnosis not present

## 2019-08-21 DIAGNOSIS — E7849 Other hyperlipidemia: Secondary | ICD-10-CM

## 2019-08-21 DIAGNOSIS — E559 Vitamin D deficiency, unspecified: Secondary | ICD-10-CM | POA: Diagnosis not present

## 2019-08-21 MED ORDER — ATORVASTATIN CALCIUM 10 MG PO TABS: 10.0000 mg | ORAL_TABLET | Freq: Every day | ORAL | 0 refills | Status: DC

## 2019-08-22 NOTE — Progress Notes (Signed)
Chief Complaint:   OBESITY Kristina [name] is here to discuss her progress with her obesity treatment plan along with follow-up of her obesity related diagnoses. Kristina [name] is on the Category 1 Plan and states she is following her eating plan approximately 35 to 40% of the time. Kristina [name] states she is walking 60 minutes 5 times per week.  Today's visit was #: 9 Starting weight: 179 lbs Starting date: 03/26/2019 Today's weight: 158 lbs Today's date: 08/21/2019 Total lbs lost to date: 21 Total lbs lost since last in-office visit: 2  Interim History: Kristina [name] was off the plan over the holidays and she did indulge in quite a few sweets. She did not exercise, and she is working on increasing exercise. Kristina [name] is now back on the plan.  Subjective:   Vitamin D deficiency Kristina [name]'s vitamin D level is at goal (64.9) on 07/24/19. Labs were discussed with patient today.  Other hyperlipidemia  Kristina [name]'s LDL is still 152 (07/24/19), despite weight loss. Her ASCVD risk is > 8%. Her HDL is < 50. She is not on statin. She is positive for family history of coronary artery disease (father). Labs were discussed with patient today. The 10-year ASCVD risk score Kristina Bussing DC Brooke Bonito., et al., 2013) is: 8.6%   Values used to calculate the score:     Age: 9 years     Sex: Female     Is Non-Hispanic African American: No     Diabetic: No     Tobacco smoker: No     Systolic Blood Pressure: [id number] mmHg     Is BP treated: Yes     HDL Cholesterol: 43 mg/dL     Total Cholesterol: 213 mg/dL   Essential hypertension Kristina [name]'s blood pressure has been quite low the last several visits. She is on Propranolol and Losartan 25 mg. She takes Propranolol  for tremor and hypertension. She reports occasional dizziness. She is unsure if this is due to her blood pressure or inner ear issues.  Assessment/Plan:   Vitamin D deficiency Low Vitamin D level contributes to fatigue and are associated with obesity, breast, and colon cancer. Kristina [name] agrees  to discontinue prescription vitamin D and start OTC vitamin D3 4,000 IU daily. She will follow-up for routine testing of vitamin D, at least 2-3 times per year to avoid over-replacement.  Other hyperlipidemia  Cardiovascular risk and specific lipid/LDL goals reviewed.  We discussed several lifestyle modifications today and Zurielle will continue to work on diet, exercise and weight loss efforts. Kristina [name] agrees to start Atorvastatin, and a new prescription was written today for Atorvastatin 10 mg daily #30 with no refills. Kristina [name] will increase exercise to help improve HDL. Orders and follow up as documented in patient record.   Counseling Intensive lifestyle modifications are the first line treatment for this issue. . Dietary changes: Increase soluble fiber. Decrease simple carbohydrates. . Exercise changes: Moderate to vigorous-intensity aerobic activity 150 minutes per week if tolerated. . Lipid-lowering medications: see documented in medical record.  Essential hypertension Kristina [name] is working on healthy weight loss and exercise to improve blood pressure control. Kristina [name] agrees to discontinue Losartan and continue Propranolol. We will watch for signs of hypotension as she continues her lifestyle modifications.  Obesity Kristina [name] is currently in the action stage of change. As such, her goal is to continue with weight loss efforts. She has agreed to on the Category 1 Plan.   Kristina [name] will continue walking for 60 minutes, 5 times per week.  We discussed the following  behavioral modification strategies today: increasing lean protein intake, decreasing simple carbohydrates and planning for success.  Kristina [name] has agreed to follow-up with our clinic in 2 weeks. She was informed of the importance of frequent follow-up visits to maximize her success with intensive lifestyle modifications for her multiple health conditions.   Objective:   Blood pressure 106/64, pulse 65, temperature 98.4 F (36.9 C), temperature  source Oral, height 5\' 2"  (1.575 m), weight 158 lb (71.7 kg), SpO2 97 %. Body mass index is 28.9 kg/m.  General: Cooperative, alert, well developed, in no acute distress. HEENT: Conjunctivae and lids unremarkable. Neck: No thyromegaly.  Cardiovascular: Regular rhythm.  Lungs: Normal work of breathing. Extremities: No edema.  Neurologic: No focal deficits.   Lab Results  Component Value Date   CREATININE 0.98 07/24/2019   BUN 23 07/24/2019   NA 136 07/24/2019   K 4.5 07/24/2019   CL 102 07/24/2019   CO2 20 07/24/2019   Lab Results  Component Value Date   ALT 41 (H) 07/24/2019   AST 28 07/24/2019   ALKPHOS 95 07/24/2019   BILITOT 0.4 07/24/2019   Lab Results  Component Value Date   HGBA1C 5.3 07/24/2019   HGBA1C 5.6 03/26/2019   HGBA1C 5.7 05/19/2018   Lab Results  Component Value Date   INSULIN 5.2 07/24/2019   INSULIN 11.6 03/26/2019   Lab Results  Component Value Date   TSH 1.970 07/24/2019   Lab Results  Component Value Date   CHOL 213 (H) 07/24/2019   HDL 43 07/24/2019   LDLCALC 152 (H) 07/24/2019   TRIG 101 07/24/2019   CHOLHDL 4.2 08/04/2017   Lab Results  Component Value Date   WBC 11.6 (H) 01/18/2018   HGB 14.6 01/18/2018   HCT 42.8 01/18/2018   MCV 91.3 01/18/2018   PLT 308 01/18/2018   No results found for: IRON, TIBC, FERRITIN   Ref. Range 07/24/2019 11:15  Vitamin D, 25-Hydroxy Latest Ref Range: 30.0 - 100.0 ng/mL 64.9   Obesity Behavioral Intervention Documentation for Insurance:   Approximately 15 minutes were spent on the discussion below.  ASK: We discussed the diagnosis of obesity with Kristina [name] today and Kristina [name] agreed to give [place] permission to discuss obesity behavioral modification therapy today.  ASSESS: Kristina [name] has the diagnosis of obesity and her BMI today is 28.89. Kristina [name] is in the action stage of change.   ADVISE: Kristina [name] was educated on the multiple health risks of obesity as well as the benefit of weight loss to improve  her health. She was advised of the need for long term treatment and the importance of lifestyle modifications to improve her current health and to decrease her risk of future health problems.  AGREE: Multiple dietary modification options and treatment options were discussed and Bird agreed to follow the recommendations documented in the above note.  ARRANGE: Tahli was educated on the importance of frequent visits to treat obesity as outlined per CMS and USPSTF guidelines and agreed to schedule her next follow up appointment today.  Attestation Statements:   Reviewed by clinician on day of visit: allergies, medications, problem list, medical history, surgical history, family history, social history, and previous encounter notes.  Corey Skains, am acting as Location manager for [employer], FNP-C.  I have reviewed the above documentation for accuracy and completeness, and I agree with the above. -  Georgianne Fick, FNP

## 2019-08-23 ENCOUNTER — Encounter (INDEPENDENT_AMBULATORY_CARE_PROVIDER_SITE_OTHER): Payer: Self-pay | Admitting: Family Medicine

## 2019-09-05 ENCOUNTER — Other Ambulatory Visit: Payer: Self-pay

## 2019-09-05 ENCOUNTER — Encounter (INDEPENDENT_AMBULATORY_CARE_PROVIDER_SITE_OTHER): Payer: Self-pay | Admitting: Family Medicine

## 2019-09-05 ENCOUNTER — Ambulatory Visit (INDEPENDENT_AMBULATORY_CARE_PROVIDER_SITE_OTHER): Payer: PPO | Admitting: Family Medicine

## 2019-09-05 VITALS — BP 110/63 | HR 60 | Temp 98.3°F | Ht 62.0 in | Wt 158.0 lb

## 2019-09-05 DIAGNOSIS — I10 Essential (primary) hypertension: Secondary | ICD-10-CM

## 2019-09-05 DIAGNOSIS — Z6830 Body mass index (BMI) 30.0-30.9, adult: Secondary | ICD-10-CM | POA: Diagnosis not present

## 2019-09-05 DIAGNOSIS — E7849 Other hyperlipidemia: Secondary | ICD-10-CM | POA: Diagnosis not present

## 2019-09-05 DIAGNOSIS — E669 Obesity, unspecified: Secondary | ICD-10-CM

## 2019-09-05 MED ORDER — CHOLECALCIFEROL 100 MCG (4000 UT) PO CAPS: 1.0000 | ORAL_CAPSULE | Freq: Every day | ORAL | 0 refills | Status: DC

## 2019-09-05 MED ORDER — ATORVASTATIN CALCIUM 10 MG PO TABS: 10.0000 mg | ORAL_TABLET | Freq: Every day | ORAL | 0 refills | Status: DC

## 2019-09-05 NOTE — Progress Notes (Signed)
Chief Complaint:   OBESITY Kristina [name] is here to discuss her progress with her obesity treatment plan along with follow-up of her obesity related diagnoses. Kristina [name] is on the Category 1 Plan and states she is following her eating plan approximately 90% of the time. Kristina [name] states she is walking 60 minutes 7 times per week.  Today's visit was #: 10 Starting weight: 179 lbs Starting date: 03/26/2019 Today's weight: 158 lbs Today's date: 09/05/2019 Total lbs lost to date: 21 Total lbs lost since last in-office visit: 0  Interim History: Kristina [name] has done well on the plan but she did indulge in a slice of cheesecake recently. She has no polyphagia. Kristina [name] is eating all of the food on the plan. She is using wrist and leg weights at times when walking. He goal weight is 140-150 lbs.  Subjective:   Other hyperlipidemia  Kristina [name] has hyperlipidemia and she only started Atorvastatin two days ago. It was prescribed st the last OV. She is tolerating it well so far. She requests a 90 day prescription. Kristina [name] has been trying to improve her cholesterol levels with intensive lifestyle modification including a low saturated fat diet, exercise and weight loss. She denies any chest pain.  Lab Results  Component Value Date   ALT 41 (H) 07/24/2019   AST 28 07/24/2019   ALKPHOS 95 07/24/2019   BILITOT 0.4 07/24/2019   Lab Results  Component Value Date   CHOL 213 (H) 07/24/2019   HDL 43 07/24/2019   LDLCALC 152 (H) 07/24/2019   TRIG 101 07/24/2019   CHOLHDL 4.2 08/04/2017   Essential hypertension Kristina [name] is on propranolol for tremor and hypertension. Losartan was discontinued at the last visit due to hypotension.Marland Kitchen HTN is well controlled on propranolol only.  BP Readings from Last 3 Encounters:  09/05/19 110/63  08/21/19 106/64  07/24/19 107/67   Lab Results  Component Value Date   CREATININE 0.98 07/24/2019   CREATININE 0.89 03/26/2019   CREATININE 1.02 05/19/2018   Assessment/Plan:   Other  hyperlipidemia  Cardiovascular risk and specific lipid/LDL goals reviewed.  We discussed several lifestyle modifications today and Kristina [name] will continue to work on diet, exercise and weight loss efforts. Kristina [name] agrees to continue Atorvastatin 10 mg daily #90 with no refills. Orders and follow up as documented in patient record.   Essential hypertension Kristina [name] is working on healthy weight loss and exercise to improve blood pressure control. We will watch for signs of hypotension as she continues her lifestyle modifications. Kristina [name] will continue Propranolol.  Class 1 obesity with serious comorbidity and body mass index (BMI) of 30.0 to 30.9 in adult, unspecified obesity type Kristina [name] is currently in the action stage of change. As such, her goal is to continue with weight loss efforts. She has agreed to the Category 1 Plan.   Exercise goals: Kristina [name] will continue walking for 60 minutes, 7 times per week and she will add resistance exercise 2 times per week.  Behavioral modification strategies: decreasing simple carbohydrates and planning for success.  Kristina [name] has agreed to follow-up with our clinic in 2 weeks. She was informed of the importance of frequent follow-up visits to maximize her success with intensive lifestyle modifications for her multiple health conditions.   Objective:   Blood pressure 110/63, pulse 60, temperature 98.3 F (36.8 C), temperature source Oral, height 5\' 2"  (1.575 m), weight 158 lb (71.7 kg), SpO2 97 %. Body mass index is 28.9 kg/m.  General: Cooperative, alert, well developed, in no acute  distress. HEENT: Conjunctivae and lids unremarkable. Cardiovascular: Regular rhythm.  Lungs: Normal work of breathing. Neurologic: No focal deficits.   Lab Results  Component Value Date   CREATININE 0.98 07/24/2019   BUN 23 07/24/2019   NA 136 07/24/2019   K 4.5 07/24/2019   CL 102 07/24/2019   CO2 20 07/24/2019   Lab Results  Component Value Date   ALT 41 (H) 07/24/2019   AST 28  07/24/2019   ALKPHOS 95 07/24/2019   BILITOT 0.4 07/24/2019   Lab Results  Component Value Date   HGBA1C 5.3 07/24/2019   HGBA1C 5.6 03/26/2019   HGBA1C 5.7 05/19/2018   Lab Results  Component Value Date   INSULIN 5.2 07/24/2019   INSULIN 11.6 03/26/2019   Lab Results  Component Value Date   TSH 1.970 07/24/2019   Lab Results  Component Value Date   CHOL 213 (H) 07/24/2019   HDL 43 07/24/2019   LDLCALC 152 (H) 07/24/2019   TRIG 101 07/24/2019   CHOLHDL 4.2 08/04/2017   Lab Results  Component Value Date   WBC 11.6 (H) 01/18/2018   HGB 14.6 01/18/2018   HCT 42.8 01/18/2018   MCV 91.3 01/18/2018   PLT 308 01/18/2018   No results found for: IRON, TIBC, FERRITIN  Obesity Behavioral Intervention Documentation for Insurance:   Approximately 15 minutes were spent on the discussion below.  ASK: We discussed the diagnosis of obesity with Kristina [name] today and Kristina [name] agreed to give [place] permission to discuss obesity behavioral modification therapy today.  ASSESS: Kristina [name] has the diagnosis of obesity and her BMI today is 28.89. Kristina [name] is in the action stage of change.   ADVISE: Kristina [name] was educated on the multiple health risks of obesity as well as the benefit of weight loss to improve her health. She was advised of the need for long term treatment and the importance of lifestyle modifications to improve her current health and to decrease her risk of future health problems.  AGREE: Multiple dietary modification options and treatment options were discussed and Kristina [name] agreed to follow the recommendations documented in the above note.  ARRANGE: Kristina [name] was educated on the importance of frequent visits to treat obesity as outlined per CMS and USPSTF guidelines and agreed to schedule her next follow up appointment today.  Attestation Statements:   Reviewed by clinician on day of visit: allergies, medications, problem list, medical history, surgical history, family history, social history, and  previous encounter notes.  Corey Skains, am acting as Location manager for [employer], FNP-C.  I have reviewed the above documentation for accuracy and completeness, and I agree with the above. -  Deaven Barron [employer], FNP-C

## 2019-09-06 ENCOUNTER — Encounter (INDEPENDENT_AMBULATORY_CARE_PROVIDER_SITE_OTHER): Payer: Self-pay | Admitting: Family Medicine

## 2019-09-07 ENCOUNTER — Other Ambulatory Visit: Payer: Self-pay | Admitting: Family Medicine

## 2019-09-19 ENCOUNTER — Encounter (INDEPENDENT_AMBULATORY_CARE_PROVIDER_SITE_OTHER): Payer: Self-pay | Admitting: Family Medicine

## 2019-09-19 ENCOUNTER — Ambulatory Visit (INDEPENDENT_AMBULATORY_CARE_PROVIDER_SITE_OTHER): Payer: PPO | Admitting: Family Medicine

## 2019-09-19 ENCOUNTER — Other Ambulatory Visit: Payer: Self-pay

## 2019-09-19 VITALS — BP 119/69 | HR 65 | Temp 98.1°F | Ht 62.0 in | Wt 156.0 lb

## 2019-09-19 DIAGNOSIS — E8881 Metabolic syndrome: Secondary | ICD-10-CM | POA: Diagnosis not present

## 2019-09-19 DIAGNOSIS — E669 Obesity, unspecified: Secondary | ICD-10-CM | POA: Diagnosis not present

## 2019-09-19 DIAGNOSIS — Z6830 Body mass index (BMI) 30.0-30.9, adult: Secondary | ICD-10-CM | POA: Diagnosis not present

## 2019-09-19 NOTE — Progress Notes (Signed)
Chief Complaint:   OBESITY Kristina [name] is here to discuss her progress with her obesity treatment plan along with follow-up of her obesity related diagnoses. Kristina [name] is on the Category 1 Plan and states she is following her eating plan approximately 95% of the time. Kristina [name] states she is walking outside for 60 minutes 4 times per week.  Today's visit was #: 11 Starting weight: 179 lbs Starting date: 03/26/2019 Today's weight: 156 lbs Today's date: 09/19/2019 Total lbs lost to date: 23 Total lbs lost since last in-office visit: 2  Interim History: Kristina [name] did indulge at the [employer], but she is back on the plan. She gets frustrated because weight loss is slow. She is getting close to goal of 140-150 lbs.  Subjective:   Insulin resistance Kristina [name] has a diagnosis of insulin resistance based on her elevated fasting insulin level >5. She continues to work on diet and exercise to decrease her risk of diabetes. Kristina [name] has mild polyphagia at night. She also reports cravings at night. We discussed metformin today.  Lab Results  Component Value Date   INSULIN 5.2 07/24/2019   INSULIN 11.6 03/26/2019   Lab Results  Component Value Date   HGBA1C 5.3 07/24/2019   Assessment/Plan:   Insulin resistance Kristina [name] will continue to work on weight loss, exercise, and decreasing simple carbohydrates to help decrease the risk of diabetes. Kristina [name] will consider taking metformin and she agreed to follow-up with [place] as directed to closely monitor her progress.  Class 1 obesity with serious comorbidity and body mass index (BMI) of 30.0 to 30.9 in adult, unspecified obesity type Kristina [name] is currently in the action stage of change. As such, her goal is to continue with weight loss efforts. She has agreed to the Category 1 Plan.   Exercise goals: Kristina [name] was instructed to add resistance a few days per week.  Behavioral modification strategies: planning for success.  We will do indirect calorimetry at the next  visit.  Kristina [name] has agreed to follow-up with our clinic in 3 weeks. She was informed of the importance of frequent follow-up visits to maximize her success with intensive lifestyle modifications for her multiple health conditions.   Objective:   Blood pressure 119/69, pulse 65, temperature 98.1 F (36.7 C), temperature source Oral, height 5\' 2"  (1.575 m), weight 156 lb (70.8 kg), SpO2 99 %. Body mass index is 28.53 kg/m.  General: Cooperative, alert, well developed, in no acute distress. HEENT: Conjunctivae and lids unremarkable. Cardiovascular: Regular rhythm.  Lungs: Normal work of breathing. Neurologic: No focal deficits.   Lab Results  Component Value Date   CREATININE 0.98 07/24/2019   BUN 23 07/24/2019   NA 136 07/24/2019   K 4.5 07/24/2019   CL 102 07/24/2019   CO2 20 07/24/2019   Lab Results  Component Value Date   ALT 41 (H) 07/24/2019   AST 28 07/24/2019   ALKPHOS 95 07/24/2019   BILITOT 0.4 07/24/2019   Lab Results  Component Value Date   HGBA1C 5.3 07/24/2019   HGBA1C 5.6 03/26/2019   HGBA1C 5.7 05/19/2018   Lab Results  Component Value Date   INSULIN 5.2 07/24/2019   INSULIN 11.6 03/26/2019   Lab Results  Component Value Date   TSH 1.970 07/24/2019   Lab Results  Component Value Date   CHOL 213 (H) 07/24/2019   HDL 43 07/24/2019   LDLCALC 152 (H) 07/24/2019   TRIG 101 07/24/2019   CHOLHDL 4.2 08/04/2017   Lab Results  Component Value Date   WBC 11.6 (H) 01/18/2018   HGB 14.6 01/18/2018   HCT 42.8 01/18/2018   MCV 91.3 01/18/2018   PLT 308 01/18/2018   No results found for: IRON, TIBC, FERRITIN   Ref. Range 07/24/2019 11:15  Vitamin D, 25-Hydroxy Latest Ref Range: 30.0 - 100.0 ng/mL 64.9   Obesity Behavioral Intervention Documentation for Insurance:   Approximately 15 minutes were spent on the discussion below.  ASK: We discussed the diagnosis of obesity with Kristina [name] today and Kristina [name] agreed to give [place] permission to discuss obesity  behavioral modification therapy today.  ASSESS: Kristina [name] has the diagnosis of obesity and her BMI today is 28.53. Kristina [name] is in the action stage of change.   ADVISE: Kristina [name] was educated on the multiple health risks of obesity as well as the benefit of weight loss to improve her health. She was advised of the need for long term treatment and the importance of lifestyle modifications to improve her current health and to decrease her risk of future health problems.  AGREE: Multiple dietary modification options and treatment options were discussed and Kristina [name] agreed to follow the recommendations documented in the above note.  ARRANGE: Kristina [name] was educated on the importance of frequent visits to treat obesity as outlined per CMS and USPSTF guidelines and agreed to schedule her next follow up appointment today.  Attestation Statements:   Reviewed by clinician on day of visit: allergies, medications, problem list, medical history, surgical history, family history, social history, and previous encounter notes.  Corey Skains, am acting as Location manager for [employer], FNP-C.  I have reviewed the above documentation for accuracy and completeness, and I agree with the above. -  Cortlin Marano [employer], FNP-C

## 2019-09-20 ENCOUNTER — Encounter (INDEPENDENT_AMBULATORY_CARE_PROVIDER_SITE_OTHER): Payer: Self-pay | Admitting: Family Medicine

## 2019-09-24 DIAGNOSIS — M810 Age-related osteoporosis without current pathological fracture: Secondary | ICD-10-CM | POA: Diagnosis not present

## 2019-09-24 DIAGNOSIS — N951 Menopausal and female climacteric states: Secondary | ICD-10-CM | POA: Diagnosis not present

## 2019-09-25 ENCOUNTER — Other Ambulatory Visit: Payer: Self-pay | Admitting: Obstetrics & Gynecology

## 2019-09-25 DIAGNOSIS — Z1231 Encounter for screening mammogram for malignant neoplasm of breast: Secondary | ICD-10-CM

## 2019-09-26 ENCOUNTER — Other Ambulatory Visit: Payer: Self-pay | Admitting: Obstetrics & Gynecology

## 2019-09-26 DIAGNOSIS — R5381 Other malaise: Secondary | ICD-10-CM

## 2019-09-29 ENCOUNTER — Ambulatory Visit: Payer: PPO | Attending: Internal Medicine

## 2019-09-29 DIAGNOSIS — Z23 Encounter for immunization: Secondary | ICD-10-CM | POA: Insufficient documentation

## 2019-09-29 NOTE — Progress Notes (Signed)
   Covid-19 Vaccination Clinic  Name:  Rosse Knipe    MRN: [id number] DOB: [birth year]  09/29/2019  Ms. Siebels was observed post Covid-19 immunization for 15 minutes without incidence. She was provided with Vaccine Information Sheet and instruction to access the V-Safe system.   Ms. Cassity was instructed to call 911 with any severe reactions post vaccine: Marland Kitchen Difficulty breathing  . Swelling of your face and throat  . A fast heartbeat  . A bad rash all over your body  . Dizziness and weakness    Immunizations Administered    Name Date Dose VIS Date Route   Pfizer COVID-19 Vaccine 09/29/2019 10:44 AM 0.3 mL 07/20/2019 Intramuscular   Manufacturer: [place]   Lot: [id number]   [place]: [id number]

## 2019-10-05 ENCOUNTER — Ambulatory Visit
Admission: EM | Admit: 2019-10-05 | Discharge: 2019-10-05 | Disposition: A | Discharge status: Home / Self Care | Payer: PPO | Attending: Emergency Medicine | Admitting: Emergency Medicine

## 2019-10-05 ENCOUNTER — Other Ambulatory Visit: Payer: Self-pay

## 2019-10-05 DIAGNOSIS — Z23 Encounter for immunization: Secondary | ICD-10-CM | POA: Diagnosis not present

## 2019-10-05 DIAGNOSIS — S61451A Open bite of right hand, initial encounter: Secondary | ICD-10-CM | POA: Diagnosis not present

## 2019-10-05 DIAGNOSIS — W5501XA Bitten by cat, initial encounter: Secondary | ICD-10-CM

## 2019-10-05 MED ORDER — AMOXICILLIN-POT CLAVULANATE 875-125 MG PO TABS: 1.0000 | ORAL_TABLET | Freq: Two times a day (BID) | ORAL | 0 refills | Status: DC

## 2019-10-05 MED ORDER — TETANUS-DIPHTH-ACELL PERTUSSIS 5-2.5-18.5 LF-MCG/0.5 IM SUSP
0.5000 mL | Freq: Once | INTRAMUSCULAR | Status: AC
  Administered 2019-10-05: 0.5 mL via INTRAMUSCULAR

## 2019-10-05 NOTE — Discharge Instructions (Addendum)
Your tetanus was updated today.  Take the antibiotic Augmentin as directed.    Keep your wound clean and dry.  Wash it gently twice a day with soap and water.  Apply an antibiotic cream and bandage twice a day.    Return here if you see signs of infection, such as increased pain, redness, pus-like drainage, warmth, fever, chills, or other concerning symptoms.

## 2019-10-05 NOTE — ED Triage Notes (Signed)
Patient states that around 4am this morning her cat was sick and dying and accidentally bit her on her right hand, approx 6 puncture areas. Patient states that she cleaned the area heavily this morning with antibacterial and applied some ointment. States that Cat was put to sleep by the vet this morning. Patient reports that cat was UTD on vaccines and brought that information with her.

## 2019-10-05 NOTE — ED Provider Notes (Signed)
Kristina [name]    CSN: [id number] Arrival date & time: 10/05/19  1019      History   Chief Complaint Chief Complaint  Patient presents with  . Animal Bite    HPI Kristina [name] is a 69 y.o. female.   Patient presents with cat bite to her right hand which occurred this morning.  Her cat bit her accidentally when receiving a shot; the cat was old and sick and being put to sleep.  She has treated this at home with an antibiotic ointment.  She states her cat was up-to-date on her shots.  Patient states her on last tetanus was >10 years.  She denies pus or drainage from the wounds; she denies fever, chills, weakness, numbness, paresthesias, or other symptoms.  The history is provided by the patient.    Past Medical History:  Diagnosis Date  . Anxiety   . Arthritis   . Back pain   . Cancer ([place])    basal and squamous cell skin  . Chicken pox   . Chronic back pain   . COPD (chronic obstructive pulmonary disease) ([place])   . Depression   . Gallbladder problem   . GERD (gastroesophageal reflux disease)   . Hypertension   . Hypothyroidism   . Joint pain   . Lactose intolerance   . Migraines   . Osteoarthritis   . SOB (shortness of breath)   . Thyroid disease   . Tremor   . Vitamin D deficiency     Patient Active Problem List   Diagnosis Date Noted  . Prediabetes 07/26/2019  . Insulin resistance 06/14/2019  . Hypothyroidism 05/19/2018  . B12 deficiency 05/19/2018  . Vitamin D deficiency 12/16/2017  . Class 1 obesity with serious comorbidity and body mass index (BMI) of 30.0 to 30.9 in adult 12/16/2017  . Benign essential tremor 07/26/2017  . Thyroid nodule 07/04/2017  . Essential hypertension 06/07/2017  . GERD (gastroesophageal reflux disease) 03/30/2016  . 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 Anxiety disorder 03/29/2016  . Insomnia 03/29/2016  . Osteopenia 03/29/2016  . Migraine headache without aura 03/29/2016  . Hyperlipidemia 03/29/2016  . History of basal  cell carcinoma 03/29/2016  . Generalized osteoarthritis of multiple sites 03/29/2016  . Back pain, chronic 03/29/2016    Past Surgical History:  Procedure Laterality Date  . ABDOMINAL HYSTERECTOMY  1978  . CATARACT EXTRACTION Left 12/20/2017   will have the right one completed a month later   . CHOLECYSTECTOMY  2000  . COLONOSCOPY  2007  . JOINT REPLACEMENT Right 08/19/2017   great toe   . UPPER GASTROINTESTINAL ENDOSCOPY  [id number]    OB History    Gravida  0   Para  0   Term  0   Preterm  0   AB  0   Living  0     SAB  0   TAB  0   Ectopic  0   Multiple  0   Live Births  0            Home Medications    Prior to Admission medications   Medication Sig Start Date End Date Taking? Authorizing Provider  Acetaminophen (TYLENOL PO) Take 400 mg by mouth 2 (two) times daily.   Yes [provider]  albuterol (PROVENTIL HFA;VENTOLIN HFA) 108 (90 Base) MCG/ACT inhaler Inhale 2 puffs into the lungs every 6 (six) hours as needed for wheezing or shortness of breath. 08/10/18  Yes [place], Betty G, MD  ALPRAZolam (XANAX) 0.25 MG tablet TAKE 1 TABLET BY MOUTH AT BEDTIME AS NEEDED FOR ANXIETY AND FOR SLEEP 06/27/19  Yes [place], Betty G, MD  atorvastatin (LIPITOR) 10 MG tablet Take 1 tablet (10 mg total) by mouth daily. 09/05/19  Yes Whitmire, Dawn W, FNP  Azelaic Acid (FINACEA) 15 % cream Apply 1 application topically 2 (two) times daily. After skin is thoroughly washed and patted dry, gently but thoroughly massage a thin film of azelaic acid cream into the affected area twice daily, in the morning and evening.   Yes [provider]  Cholecalciferol 100 MCG (4000 UT) CAPS Take 1 capsule (4,000 Units total) by mouth daily. 09/05/19  Yes Whitmire, Joneen Boers, FNP  cyclobenzaprine (FLEXERIL) 10 MG tablet Take 1 tablet by mouth once daily 09/07/19  Yes [place], Betty G, MD  diclofenac sodium (VOLTAREN) 1 % GEL Apply 3 gm to 3 large joints up to 3 times a day.Dispense 3  tubes with 3 refills. 09/09/18  Yes [place], Betty G, MD  DULoxetine (CYMBALTA) 60 MG capsule Take 1 capsule (60 mg total) by mouth daily. 02/23/19  Yes [place], Betty G, MD  estradiol (VIVELLE-DOT) 0.0375 MG/24HR APPLY 1 PATCH TOPICALLY TWICE A WEEK 02/24/18  Yes [provider]  levothyroxine (SYNTHROID) 25 MCG tablet Take 1 tablet (25 mcg total) by mouth daily before breakfast. 02/23/19  Yes [place], Betty G, MD  Magnesium 250 MG TABS Take by mouth daily.   Yes [provider]  meclizine (ANTIVERT) 12.5 MG tablet Take 1 tablet (12.5 mg total) by mouth 3 (three) times daily as needed for dizziness. 03/30/16  Yes [place], Betty G, MD  Multiple Vitamin (MULTIVITAMIN) tablet Take 1 tablet by mouth daily.   Yes [provider]  pantoprazole (PROTONIX) 40 MG tablet Take 1 tablet by mouth once daily 06/27/19  Yes [place], Betty G, MD  propranolol (INDERAL) 40 MG tablet Take 1 tablet (40 mg total) by mouth 2 (two) times daily. 03/27/19  Yes [place], Betty G, MD  vitamin B-12 (CYANOCOBALAMIN) 1000 MCG tablet Take 1,000 mcg by mouth once a week.   Yes [provider]  amoxicillin-clavulanate (AUGMENTIN) 875-125 MG tablet Take 1 tablet by mouth every 12 (twelve) hours. 10/05/19   Sharion Balloon, NP    Family History Family History  Problem Relation Age of Onset  . Arthritis Mother   . Diabetes Mother   . COPD Mother   . Heart failure Mother   . Stroke Father   . Hypertension Father   . Hyperlipidemia Father   . Diabetes Father   . CAD Father   . Heart disease Father   . Heart failure Father   . Breast cancer Maternal Aunt   . Cancer Sister        bladder cancer   . Throat cancer Brother   . Colon cancer Neg Hx   . Stomach cancer Neg Hx   . Esophageal cancer Neg Hx     Social History Social History   Tobacco Use  . Smoking status: Former Smoker    Packs/day: 1.50    Years: 25.00    Pack years: 72.50    Quit date: 03/03/1990    Years since quitting: 29.6  .  Smokeless tobacco: Never Used  Substance Use Topics  . Alcohol use: Yes    Comment: glass of wine once a month  . Drug use: No     Allergies   Meloxicam, Sulfa antibiotics, and Hydrocodone   Review of  Systems Review of Systems  Constitutional: Negative for chills and fever.  HENT: Negative for ear pain and sore throat.   Eyes: Negative for pain and visual disturbance.  Respiratory: Negative for cough and shortness of breath.   Cardiovascular: Negative for chest pain and palpitations.  Gastrointestinal: Negative for abdominal pain and vomiting.  Genitourinary: Negative for dysuria and hematuria.  Musculoskeletal: Negative for arthralgias and back pain.  Skin: Positive for wound. Negative for color change and rash.  Neurological: Negative for seizures and syncope.  All other systems reviewed and are negative.    Physical Exam Triage Vital Signs ED Triage Vitals  Enc Vitals Group     BP 10/05/19 1035 (!) 146/78     Pulse Rate 10/05/19 1035 77     Resp 10/05/19 1035 16     Temp 10/05/19 1035 97.7 F (36.5 C)     Temp Source 10/05/19 1035 Oral     SpO2 10/05/19 1035 97 %     Weight 10/05/19 1033 159 lb (72.1 kg)     Height 10/05/19 1033 5\' 2"  (1.575 m)     Head Circumference --      Peak Flow --      Pain Score 10/05/19 1033 7     Pain Loc --      Pain Edu? --      Excl. in [place]? --    No data found.  Updated Vital Signs BP (!) 146/78 (BP Location: Left Arm)   Pulse 77   Temp 97.7 F (36.5 C) (Oral)   Resp 16   Ht 5\' 2"  (1.575 m)   Wt 159 lb (72.1 kg)   LMP  (LMP Unknown)   SpO2 97%   BMI 29.08 kg/m   Visual Acuity Right Eye Distance:   Left Eye Distance:   Bilateral Distance:    Right Eye Near:   Left Eye Near:    Bilateral Near:     Physical Exam Vitals and nursing note reviewed.  Constitutional:      General: She is not in acute distress.    Appearance: She is well-developed.  HENT:     Head: Normocephalic and atraumatic.     Mouth/Throat:      Mouth: Mucous membranes are moist.  Eyes:     Conjunctiva/sclera: Conjunctivae normal.  Cardiovascular:     Rate and Rhythm: Normal rate and regular rhythm.     Heart sounds: No murmur.  Pulmonary:     Effort: Pulmonary effort is normal. No respiratory distress.     Breath sounds: Normal breath sounds.  Abdominal:     Palpations: Abdomen is soft.     Tenderness: There is no abdominal tenderness. There is no guarding or rebound.  Musculoskeletal:        General: Swelling and tenderness present. No deformity. Normal range of motion.       Hands:     Cervical back: Neck supple.  Skin:    General: Skin is warm and dry.     Capillary Refill: Capillary refill takes less than 2 seconds.     Findings: Lesion present. No bruising.  Neurological:     General: No focal deficit present.     Mental Status: She is alert and oriented to person, place, and time.     Sensory: No sensory deficit.     Motor: No weakness.  Psychiatric:        Mood and Affect: Mood normal.        Behavior:  Behavior normal.      UC Treatments / Results  Labs (all labs ordered are listed, but only abnormal results are displayed) Labs Reviewed - No data to display  EKG   Radiology No results found.  Procedures Procedures (including critical care time)  Medications Ordered in UC Medications  Tdap (BOOSTRIX) injection 0.5 mL (0.5 mLs Intramuscular Given 10/05/19 1057)    Initial Impression / Assessment and Plan / UC Course  I have reviewed the triage vital signs and the nursing notes.  Pertinent labs & imaging results that were available during my care of the patient were reviewed by me and considered in my medical decision making (see chart for details).    Cat bite on right hand.  Tetanus updated.  Treating with Augmentin.  Wound care instructions and signs of infection discussed with patient.  Instructed her to return here if she has signs of infection.  Patient agrees to plan of care.       Final Clinical Impressions(s) / UC Diagnoses   Final diagnoses:  Cat bite of hand, right, initial encounter     Discharge Instructions     Your tetanus was updated today.  Take the antibiotic Augmentin as directed.    Keep your wound clean and dry.  Wash it gently twice a day with soap and water.  Apply an antibiotic cream and bandage twice a day.    Return here if you see signs of infection, such as increased pain, redness, pus-like drainage, warmth, fever, chills, or other concerning symptoms.       ED Prescriptions    Medication Sig Dispense Auth. Provider   amoxicillin-clavulanate (AUGMENTIN) 875-125 MG tablet Take 1 tablet by mouth every 12 (twelve) hours. 14 tablet Sharion Balloon, NP     PDMP not reviewed this encounter.   Sharion Balloon, NP 10/05/19 1100

## 2019-10-10 ENCOUNTER — Other Ambulatory Visit: Payer: Self-pay

## 2019-10-10 ENCOUNTER — Encounter (INDEPENDENT_AMBULATORY_CARE_PROVIDER_SITE_OTHER): Payer: Self-pay | Admitting: Family Medicine

## 2019-10-10 ENCOUNTER — Ambulatory Visit (INDEPENDENT_AMBULATORY_CARE_PROVIDER_SITE_OTHER): Payer: PPO | Admitting: Family Medicine

## 2019-10-10 VITALS — BP 128/74 | HR 70 | Temp 98.0°F | Ht 62.0 in | Wt 155.0 lb

## 2019-10-10 DIAGNOSIS — R0602 Shortness of breath: Secondary | ICD-10-CM | POA: Diagnosis not present

## 2019-10-10 DIAGNOSIS — E8881 Metabolic syndrome: Secondary | ICD-10-CM

## 2019-10-10 DIAGNOSIS — Z6830 Body mass index (BMI) 30.0-30.9, adult: Secondary | ICD-10-CM

## 2019-10-10 DIAGNOSIS — E669 Obesity, unspecified: Secondary | ICD-10-CM | POA: Diagnosis not present

## 2019-10-10 NOTE — Progress Notes (Signed)
Chief Complaint:   OBESITY Kristina [name] is here to discuss her progress with her obesity treatment plan along with follow-up of her obesity related diagnoses. Kristina [name] is on the Category 1 Plan and states she is following her eating plan approximately 90-95% of the time. Kristina [name] states she is doing 0 minutes 0 times per week.  Today's visit was #: 12 Starting weight: 179 lbs Starting date: 03/26/2019 Today's weight: 155 lbs Today's date: 10/10/2019 Total lbs lost to date: 24 Total lbs lost since last in-office visit: 1  Interim History: We repeated IC today. Kristina [name]'s RMR on 03/26/2019 in office visit was 1271, today her RMR is 1741 (increase of 370 calories).   She had to euthanize her cat recently and this has been very hard for her. However,she has still adhered well to her eating plan.  Subjective:   1. Insulin resistance Kristina [name] is not on metformin. She denies polyphagia.  2. Shortness of breath on exertion Kristina [name] reports shortness of breath with exercise.  Assessment/Plan:   1. Insulin resistance Kristina [name] will continue to work on weight loss, exercise, and decreasing simple carbohydrates to help decrease the risk of diabetes. Kristina [name] agreed to follow-up with [place] as directed to closely monitor her progress.  2. Shortness of breath on exertion Kristina [name] does feel that she gets out of breath more easily that she used to when she exercises. Kristina [name]'s shortness of breath appears to be obesity related and exercise induced. She has agreed to work on weight loss and gradually increase exercise to treat her exercise induced shortness of breath. IC was repeated today. Will continue to monitor closely.  3. Class 1 obesity with serious comorbidity and body mass index (BMI) of 30.0 to 30.9 in adult, unspecified obesity type Kristina [name] is currently in the action stage of change. As such, her goal is to continue with weight loss efforts. She has agreed to change to the Category 2 Plan.   Exercise goals: Kristina [name] is to  get back to walking at least 3 times per week.  Behavioral modification strategies: increasing lean protein intake and planning for success.  Kristina [name] has agreed to follow-up with our clinic in 2 weeks. She was informed of the importance of frequent follow-up visits to maximize her success with intensive lifestyle modifications for her multiple health conditions.   Objective:   Blood pressure 128/74, pulse 70, temperature 98 F (36.7 C), temperature source Oral, height 5\' 2"  (1.575 m), weight 155 lb (70.3 kg), SpO2 98 %. Body mass index is 28.35 kg/m.  General: Cooperative, alert, well developed, in no acute distress. HEENT: Conjunctivae and lids unremarkable. Cardiovascular: Regular rhythm.  Lungs: Normal work of breathing. Neurologic: No focal deficits.   Lab Results  Component Value Date   CREATININE 0.98 07/24/2019   BUN 23 07/24/2019   NA 136 07/24/2019   K 4.5 07/24/2019   CL 102 07/24/2019   CO2 20 07/24/2019   Lab Results  Component Value Date   ALT 41 (H) 07/24/2019   AST 28 07/24/2019   ALKPHOS 95 07/24/2019   BILITOT 0.4 07/24/2019   Lab Results  Component Value Date   HGBA1C 5.3 07/24/2019   HGBA1C 5.6 03/26/2019   HGBA1C 5.7 05/19/2018   Lab Results  Component Value Date   INSULIN 5.2 07/24/2019   INSULIN 11.6 03/26/2019   Lab Results  Component Value Date   TSH 1.970 07/24/2019   Lab Results  Component Value Date   CHOL 213 (H) 07/24/2019   HDL 43  07/24/2019   LDLCALC 152 (H) 07/24/2019   TRIG 101 07/24/2019   CHOLHDL 4.2 08/04/2017   Lab Results  Component Value Date   WBC 11.6 (H) 01/18/2018   HGB 14.6 01/18/2018   HCT 42.8 01/18/2018   MCV 91.3 01/18/2018   PLT 308 01/18/2018   No results found for: IRON, TIBC, FERRITIN  Obesity Behavioral Intervention Documentation for Insurance:   Approximately 15 minutes were spent on the discussion below.  ASK: We discussed the diagnosis of obesity with Aaziyah today and Kristina [name] agreed to give [place]  permission to discuss obesity behavioral modification therapy today.  ASSESS: Kristina [name] has the diagnosis of obesity and her BMI today is 28.34. Kristina [name] is in the action stage of change.   ADVISE: Kristina [name] was educated on the multiple health risks of obesity as well as the benefit of weight loss to improve her health. She was advised of the need for long term treatment and the importance of lifestyle modifications to improve her current health and to decrease her risk of future health problems.  AGREE: Multiple dietary modification options and treatment options were discussed and Kristina [name] agreed to follow the recommendations documented in the above note.  ARRANGE: Kristina [name] was educated on the importance of frequent visits to treat obesity as outlined per CMS and USPSTF guidelines and agreed to schedule her next follow up appointment today.  Attestation Statements:   Reviewed by clinician on day of visit: allergies, medications, problem list, medical history, surgical history, family history, social history, and previous encounter notes.   Wilhemena Durie, am acting as Location manager for [employer], FNP-C.  I have reviewed the above documentation for accuracy and completeness, and I agree with the above. -  Georgianne Fick, FNP

## 2019-10-23 ENCOUNTER — Ambulatory Visit: Payer: PPO | Attending: Internal Medicine

## 2019-10-23 DIAGNOSIS — Z23 Encounter for immunization: Secondary | ICD-10-CM

## 2019-10-23 NOTE — Progress Notes (Signed)
   Covid-19 Vaccination Clinic  Name:  [name]    MRN: [id number] DOB: [birth year]  10/23/2019  [name] was observed post Covid-19 immunization for 15 minutes without incident. She was provided with Vaccine Information Sheet and instruction to access the V-Safe system.   [name] was instructed to call 911 with any severe reactions post vaccine: Marland Kitchen Difficulty breathing  . Swelling of face and throat  . A fast heartbeat  . A bad rash all over body  . Dizziness and weakness   Immunizations Administered    Name Date Dose VIS Date Route   Pfizer COVID-19 Vaccine 10/23/2019 10:15 AM 0.3 mL 07/20/2019 Intramuscular   Manufacturer: [place]   Lot: [id number]   [place]: [id number]

## 2019-10-31 ENCOUNTER — Other Ambulatory Visit: Payer: Self-pay

## 2019-10-31 ENCOUNTER — Encounter (INDEPENDENT_AMBULATORY_CARE_PROVIDER_SITE_OTHER): Payer: Self-pay | Admitting: Family Medicine

## 2019-10-31 ENCOUNTER — Ambulatory Visit (INDEPENDENT_AMBULATORY_CARE_PROVIDER_SITE_OTHER): Payer: PPO | Admitting: Family Medicine

## 2019-10-31 VITALS — BP 106/65 | HR 70 | Temp 98.3°F | Ht 62.0 in | Wt 153.0 lb

## 2019-10-31 DIAGNOSIS — E8881 Metabolic syndrome: Secondary | ICD-10-CM

## 2019-10-31 DIAGNOSIS — Z6830 Body mass index (BMI) 30.0-30.9, adult: Secondary | ICD-10-CM | POA: Diagnosis not present

## 2019-10-31 DIAGNOSIS — E669 Obesity, unspecified: Secondary | ICD-10-CM | POA: Diagnosis not present

## 2019-10-31 DIAGNOSIS — E66811 Obesity, class 1: Secondary | ICD-10-CM

## 2019-10-31 DIAGNOSIS — E88819 Insulin resistance, unspecified: Secondary | ICD-10-CM

## 2019-10-31 NOTE — Progress Notes (Signed)
Chief Complaint:   OBESITY Kristina [name] is here to discuss her progress with her obesity treatment plan along with follow-up of her obesity related diagnoses. Kristina [name] is on the Category 2 Plan and states she is following her eating plan approximately 95% of the time. Kristina [name] states she is walking, using hand weights, and on the trampoline for 60 minutes 5 times per week.  Today's visit was #: 13 Starting weight: 179 lbs Starting date: 03/26/2019 Today's weight: 153 lbs Today's date: 10/31/2019 Total lbs lost to date: 26 Total lbs lost since last in-office visit: 2  Interim History: Kristina [name] has started resistance training recently. She was switched to Category 2 due to increased RMR at her last visit. She tends to journal more than sticking to Category 2. She is nearly at her weight goal of 150 lbs.  Subjective:   1. Insulin resistance Kristina [name] is not on metformin and she denies polyphagia. Lab Results  Component Value Date   HGBA1C 5.3 07/24/2019    Assessment/Plan:   1. Insulin resistance Kristina [name] will continue her meal plan, and will continue to work on weight loss, exercise, and decreasing simple carbohydrates to help decrease the risk of diabetes. Kristina [name] agreed to follow-up with [place] as directed to closely monitor her progress.  2. Class 1 obesity with serious comorbidity and body mass index (BMI) of 30.0 to 30.9 in adult, unspecified obesity type Kristina [name] is currently in the action stage of change. As such, her goal is to continue with weight loss efforts. She has agreed to the Category 2 Plan or keeping a food journal and adhering to recommended goals of 1200-1300 calories and 80-85 grams of protein daily.   Exercise goals: As is.  Behavioral modification strategies: increasing lean protein intake and better snacking choices.  Kristina [name] has agreed to follow-up with our clinic in 3 weeks. She was informed of the importance of frequent follow-up visits to maximize her success with intensive  lifestyle modifications for her multiple health conditions.   Objective:   Blood pressure 106/65, pulse 70, temperature 98.3 F (36.8 C), temperature source Oral, height 5\' 2"  (1.575 m), weight 153 lb (69.4 kg), SpO2 99 %. Body mass index is 27.98 kg/m.  General: Cooperative, alert, well developed, in no acute distress. HEENT: Conjunctivae and lids unremarkable. Cardiovascular: Regular rhythm.  Lungs: Normal work of breathing. Neurologic: No focal deficits.   Lab Results  Component Value Date   CREATININE 0.98 07/24/2019   BUN 23 07/24/2019   NA 136 07/24/2019   K 4.5 07/24/2019   CL 102 07/24/2019   CO2 20 07/24/2019   Lab Results  Component Value Date   ALT 41 (H) 07/24/2019   AST 28 07/24/2019   ALKPHOS 95 07/24/2019   BILITOT 0.4 07/24/2019   Lab Results  Component Value Date   HGBA1C 5.3 07/24/2019   HGBA1C 5.6 03/26/2019   HGBA1C 5.7 05/19/2018   Lab Results  Component Value Date   INSULIN 5.2 07/24/2019   INSULIN 11.6 03/26/2019   Lab Results  Component Value Date   TSH 1.970 07/24/2019   Lab Results  Component Value Date   CHOL 213 (H) 07/24/2019   HDL 43 07/24/2019   LDLCALC 152 (H) 07/24/2019   TRIG 101 07/24/2019   CHOLHDL 4.2 08/04/2017   Lab Results  Component Value Date   WBC 11.6 (H) 01/18/2018   HGB 14.6 01/18/2018   HCT 42.8 01/18/2018   MCV 91.3 01/18/2018   PLT 308 01/18/2018   No  results found for: IRON, TIBC, FERRITIN  Obesity Behavioral Intervention Documentation for Insurance:   Approximately 15 minutes were spent on the discussion below.  ASK: We discussed the diagnosis of obesity with Kristina [name] today and Kristina [name] agreed to give [place] permission to discuss obesity behavioral modification therapy today.  ASSESS: Kristina [name] has the diagnosis of obesity and her BMI today is 27.98. Kristina [name] is in the action stage of change.   ADVISE: Kristina [name] was educated on the multiple health risks of obesity as well as the benefit of weight loss to improve  her health. She was advised of the need for long term treatment and the importance of lifestyle modifications to improve her current health and to decrease her risk of future health problems.  AGREE: Multiple dietary modification options and treatment options were discussed and Kristina [name] agreed to follow the recommendations documented in the above note.  ARRANGE: Kristina [name] was educated on the importance of frequent visits to treat obesity as outlined per CMS and USPSTF guidelines and agreed to schedule her next follow up appointment today.  Attestation Statements:   Reviewed by clinician on day of visit: allergies, medications, problem list, medical history, surgical history, family history, social history, and previous encounter notes.   Kristina [name], am acting as Location manager for [employer], FNP-C.  I have reviewed the above documentation for accuracy and completeness, and I agree with the above. -  Georgianne Fick, FNP

## 2019-11-01 ENCOUNTER — Encounter (INDEPENDENT_AMBULATORY_CARE_PROVIDER_SITE_OTHER): Payer: Self-pay | Admitting: Family Medicine

## 2019-11-02 ENCOUNTER — Ambulatory Visit: Payer: Self-pay

## 2019-11-07 ENCOUNTER — Other Ambulatory Visit: Payer: Self-pay | Admitting: Family Medicine

## 2019-11-07 DIAGNOSIS — G4700 Insomnia, unspecified: Secondary | ICD-10-CM

## 2019-11-21 ENCOUNTER — Encounter (INDEPENDENT_AMBULATORY_CARE_PROVIDER_SITE_OTHER): Payer: Self-pay | Admitting: Family Medicine

## 2019-11-21 ENCOUNTER — Other Ambulatory Visit: Payer: Self-pay

## 2019-11-21 ENCOUNTER — Ambulatory Visit (INDEPENDENT_AMBULATORY_CARE_PROVIDER_SITE_OTHER): Payer: PPO | Admitting: Family Medicine

## 2019-11-21 VITALS — BP 110/65 | HR 64 | Temp 98.4°F | Ht 62.0 in | Wt 153.0 lb

## 2019-11-21 DIAGNOSIS — E538 Deficiency of other specified B group vitamins: Secondary | ICD-10-CM | POA: Diagnosis not present

## 2019-11-21 DIAGNOSIS — E038 Other specified hypothyroidism: Secondary | ICD-10-CM | POA: Diagnosis not present

## 2019-11-21 DIAGNOSIS — E8881 Metabolic syndrome: Secondary | ICD-10-CM | POA: Diagnosis not present

## 2019-11-21 DIAGNOSIS — Z6830 Body mass index (BMI) 30.0-30.9, adult: Secondary | ICD-10-CM

## 2019-11-21 DIAGNOSIS — H16223 Keratoconjunctivitis sicca, not specified as Sjogren's, bilateral: Secondary | ICD-10-CM | POA: Diagnosis not present

## 2019-11-21 DIAGNOSIS — E559 Vitamin D deficiency, unspecified: Secondary | ICD-10-CM

## 2019-11-21 DIAGNOSIS — H04123 Dry eye syndrome of bilateral lacrimal glands: Secondary | ICD-10-CM | POA: Diagnosis not present

## 2019-11-21 DIAGNOSIS — E7849 Other hyperlipidemia: Secondary | ICD-10-CM | POA: Diagnosis not present

## 2019-11-21 DIAGNOSIS — E669 Obesity, unspecified: Secondary | ICD-10-CM

## 2019-11-21 DIAGNOSIS — H1045 Other chronic allergic conjunctivitis: Secondary | ICD-10-CM | POA: Diagnosis not present

## 2019-11-21 NOTE — Progress Notes (Signed)
Chief Complaint:   OBESITY Adianez is here to discuss her progress with her obesity treatment plan along with follow-up of her obesity related diagnoses. Taliya is on the Category 2 Plan or keeping a food journal and adhering to recommended goals of 1200-1300 calories and 80-85 grams of protein daily and states she is following her eating plan approximately 95% of the time. Kimberlyn states she is walking for 60 minutes 5 times per week.  Today's visit was #: 14 Starting weight: 179 lbs Starting date: 03/26/2019 Today's weight: 153 lbs Today's date: 11/21/2019 Total lbs lost to date: 26 Total lbs lost since last in-office visit: 0  Interim History: Gazelle has stuck to the plan very well. She is 3 lbs away from her goal weight. She would like to lose down to 150 lbs.  Subjective:   1. Other specified hypothyroidism Jaydeen is stable on levothyroxine 25 mcg.  2. Insulin resistance Ozell is not on metformin and she denies polyphagia.  3. Other hyperlipidemia Armanda's last LDL was high at 152, HDL low at 43, and triglycerides normal. She is on atorvastatin 10 mg daily.  4. B12 deficiency Utha is on B12 daily. She is unsure of the dose.  5. Vitamin D deficiency Chayse is on OTC Vit D 5,000 IU daily. Last Vit D level was at goal.  Assessment/Plan:   1. Other specified hypothyroidism Patient with long-standing hypothyroidism, on levothyroxine therapy. She appears euthyroid. We will check labs today. Orders and follow up as documented in patient record.   - Comprehensive metabolic panel - T3 - T4, free - TSH  2. Insulin resistance Tailor will continue to work on weight loss, exercise, and decreasing simple carbohydrates to help decrease the risk of diabetes. We will check labs today. Lluliana agreed to follow-up with [place] as directed to closely monitor her progress.  - Hemoglobin A1c - Insulin, random  3. Other hyperlipidemia Cardiovascular risk and specific lipid/LDL goals reviewed.  We discussed several lifestyle modifications today. Koralyn will continue atorvastatin, and will continue to work on diet, exercise and weight loss efforts. We will check labs today. Orders and follow up as documented in patient record.   - Lipid Panel With LDL/HDL Ratio  4. B12 deficiency The diagnosis was reviewed with the patient. Counseling provided today, see below. We will continue to monitor. We will check labs today.  - Vitamin B12  5. Vitamin D deficiency Low Vitamin D level contributes to fatigue and are associated with obesity, breast, and colon cancer. Sarena agreed to continue OTC Vit D 5,000 IU daily and will follow-up for routine testing of Vitamin D, at least 2-3 times per year to avoid over-replacement. We will check labs today.  - VITAMIN D 25 Hydroxy (Vit-D Deficiency, Fractures)  6. Class 1 obesity with serious comorbidity and body mass index (BMI) of 30.0 to 30.9 in adult, unspecified obesity type Rhylei is currently in the action stage of change. As such, her goal is to continue with weight loss efforts. She has agreed to the Category 2 Plan.   We discussed decreasing carbohydrates in her diet and possibly starting the Low carbohydrate plan. She opted to work on reducing carbohydrates.  Exercise goals: As is. Harlen is to continue walking and add resistance 2 times per week. (she does not want to do this).  Behavioral modification strategies: decreasing simple carbohydrates.  Liann has agreed to follow-up with our clinic in 4 weeks. She was informed of the importance of frequent follow-up visits  to maximize her success with intensive lifestyle modifications for her multiple health conditions.   Milley was informed we would discuss her lab results at her next visit unless there is a critical issue that needs to be addressed sooner. Yaelin agreed to keep her next visit at the agreed upon time to discuss these results.  Objective:   Blood pressure 110/65, pulse 64, temperature  98.4 F (36.9 C), temperature source Oral, height 5\' 2"  (1.575 m), weight 153 lb (69.4 kg), SpO2 99 %. Body mass index is 27.98 kg/m.  General: Cooperative, alert, well developed, in no acute distress. HEENT: Conjunctivae and lids unremarkable. Cardiovascular: Regular rhythm.  Lungs: Normal work of breathing. Neurologic: No focal deficits.   Lab Results  Component Value Date   CREATININE 0.98 07/24/2019   BUN 23 07/24/2019   NA 136 07/24/2019   K 4.5 07/24/2019   CL 102 07/24/2019   CO2 20 07/24/2019   Lab Results  Component Value Date   ALT 41 (H) 07/24/2019   AST 28 07/24/2019   ALKPHOS 95 07/24/2019   BILITOT 0.4 07/24/2019   Lab Results  Component Value Date   HGBA1C 5.3 07/24/2019   HGBA1C 5.6 03/26/2019   HGBA1C 5.7 05/19/2018   Lab Results  Component Value Date   INSULIN 5.2 07/24/2019   INSULIN 11.6 03/26/2019   Lab Results  Component Value Date   TSH 1.970 07/24/2019   Lab Results  Component Value Date   CHOL 213 (H) 07/24/2019   HDL 43 07/24/2019   LDLCALC 152 (H) 07/24/2019   TRIG 101 07/24/2019   CHOLHDL 4.2 08/04/2017   Lab Results  Component Value Date   WBC 11.6 (H) 01/18/2018   HGB 14.6 01/18/2018   HCT 42.8 01/18/2018   MCV 91.3 01/18/2018   PLT 308 01/18/2018   No results found for: IRON, TIBC, FERRITIN  Obesity Behavioral Intervention Documentation for Insurance:   Approximately 15 minutes were spent on the discussion below.  ASK: We discussed the diagnosis of obesity with Wyona today and Ailsa agreed to give [place] permission to discuss obesity behavioral modification therapy today.  ASSESS: Khadijat has the diagnosis of obesity and her BMI today is 27.98. Adrielle is in the action stage of change.   ADVISE: Simrah was educated on the multiple health risks of obesity as well as the benefit of weight loss to improve her health. She was advised of the need for long term treatment and the importance of lifestyle modifications to improve her  current health and to decrease her risk of future health problems.  AGREE: Multiple dietary modification options and treatment options were discussed and Aleeza agreed to follow the recommendations documented in the above note.  ARRANGE: Lavona was educated on the importance of frequent visits to treat obesity as outlined per CMS and USPSTF guidelines and agreed to schedule her next follow up appointment today.  Attestation Statements:   Reviewed by clinician on day of visit: allergies, medications, problem list, medical history, surgical history, family history, social history, and previous encounter notes.   Wilhemena Durie, am acting as Location manager for [employer], FNP-C.  I have reviewed the above documentation for accuracy and completeness, and I agree with the above. -  Georgianne Fick, FNP

## 2019-11-22 ENCOUNTER — Encounter (INDEPENDENT_AMBULATORY_CARE_PROVIDER_SITE_OTHER): Payer: Self-pay | Admitting: Family Medicine

## 2019-11-22 LAB — TSH: TSH: 2.19 u[IU]/mL (ref 0.450–4.500)

## 2019-11-22 LAB — COMPREHENSIVE METABOLIC PANEL
ALT: 34 IU/L — ABNORMAL HIGH (ref 0–32)
AST: 28 IU/L (ref 0–40)
Albumin/Globulin Ratio: 1.7 (ref 1.2–2.2)
Albumin: 4.2 g/dL (ref 3.8–4.8)
Alkaline Phosphatase: 84 IU/L (ref 39–117)
BUN/Creatinine Ratio: 28 (ref 12–28)
BUN: 26 mg/dL (ref 8–27)
Bilirubin Total: 0.3 mg/dL (ref 0.0–1.2)
CO2: 23 mmol/L (ref 20–29)
Calcium: 9.4 mg/dL (ref 8.7–10.3)
Chloride: 100 mmol/L (ref 96–106)
Creatinine, Ser: 0.94 mg/dL (ref 0.57–1.00)
GFR calc Af Amer: 72 mL/min/{1.73_m2} (ref >59)
GFR calc non Af Amer: 62 mL/min/{1.73_m2} (ref >59)
Globulin, Total: 2.5 g/dL (ref 1.5–4.5)
Glucose: 94 mg/dL (ref 65–99)
Potassium: 4.6 mmol/L (ref 3.5–5.2)
Sodium: 135 mmol/L (ref 134–144)
Total Protein: 6.7 g/dL (ref 6.0–8.5)

## 2019-11-22 LAB — LIPID PANEL WITH LDL/HDL RATIO
Cholesterol, Total: 189 mg/dL (ref 100–199)
HDL: 52 mg/dL (ref >39)
LDL Chol Calc (NIH): 121 mg/dL — ABNORMAL HIGH (ref 0–99)
LDL/HDL Ratio: 2.3 ratio (ref 0.0–3.2)
Triglycerides: 87 mg/dL (ref 0–149)
VLDL Cholesterol Cal: 16 mg/dL (ref 5–40)

## 2019-11-22 LAB — HEMOGLOBIN A1C
Est. average glucose Bld gHb Est-mCnc: 111 mg/dL
Hgb A1c MFr Bld: 5.5 % (ref 4.8–5.6)

## 2019-11-22 LAB — T3: T3, Total: 84 ng/dL (ref 71–180)

## 2019-11-22 LAB — T4, FREE: Free T4: 1.33 ng/dL (ref 0.82–1.77)

## 2019-11-22 LAB — INSULIN, RANDOM: INSULIN: 4 u[IU]/mL (ref 2.6–24.9)

## 2019-11-22 LAB — VITAMIN B12: Vitamin B-12: 1827 pg/mL — ABNORMAL HIGH (ref 232–1245)

## 2019-11-22 LAB — VITAMIN D 25 HYDROXY (VIT D DEFICIENCY, FRACTURES): Vit D, 25-Hydroxy: 71.1 ng/mL (ref 30.0–100.0)

## 2019-12-17 ENCOUNTER — Encounter (INDEPENDENT_AMBULATORY_CARE_PROVIDER_SITE_OTHER): Payer: Self-pay | Admitting: Family Medicine

## 2019-12-17 ENCOUNTER — Ambulatory Visit (INDEPENDENT_AMBULATORY_CARE_PROVIDER_SITE_OTHER): Payer: PPO | Admitting: Family Medicine

## 2019-12-17 ENCOUNTER — Other Ambulatory Visit: Payer: Self-pay

## 2019-12-17 VITALS — BP 98/59 | HR 60 | Temp 98.4°F | Ht 62.0 in | Wt 154.0 lb

## 2019-12-17 DIAGNOSIS — E559 Vitamin D deficiency, unspecified: Secondary | ICD-10-CM | POA: Diagnosis not present

## 2019-12-17 DIAGNOSIS — I9589 Other hypotension: Secondary | ICD-10-CM

## 2019-12-17 DIAGNOSIS — E669 Obesity, unspecified: Secondary | ICD-10-CM

## 2019-12-17 DIAGNOSIS — Z6830 Body mass index (BMI) 30.0-30.9, adult: Secondary | ICD-10-CM | POA: Diagnosis not present

## 2019-12-17 NOTE — Progress Notes (Signed)
Chief Complaint:   OBESITY Jacqualin is here to discuss her progress with her obesity treatment plan along with follow-up of her obesity related diagnoses. Rowe is on the Category 2 Plan and states she is following her eating plan approximately 90% of the time. Payten states she is walking for 60-75 minutes 4-5 times per week.  Today's visit was #: 15 Starting weight: 179 lbs Starting date: 03/26/2019 Today's weight: 154 lbs Today's date: 12/17/2019 Total lbs lost to date: 25 Total lbs lost since last in-office visit: 0  Interim History: Ellayna notes she has been eating out more over the past few weeks due to the death of her brother in-law. She notes eating extra salt over the weekend. She reports some stress eating. She is considering going back to the [facility] to swim for exercise.  Subjective:   1. Hypotension Renne's blood pressure is 98/59 today.  She denies lightheadedness. She takes propranolol for tremors. BP Readings from Last 3 Encounters:  12/17/19 (!) 98/59  11/21/19 110/65  10/31/19 106/65      2. Vitamin D deficiency Christin is on OTC Vit D 5,000 IU daily. Last Vit D level was at goal, and has increased to 71 from 64.9. I discussed labs with the patient today.  Assessment/Plan:   1. Hypotension  Nylani sees her primary care physician tomorrow and will discuss with her. Propranolol dose may need decreased.  2. Vitamin D deficiency Low Vitamin D level contributes to fatigue and are associated with obesity, breast, and colon cancer. Zyan agreed to continue taking OTC Vit D 5,000 IU Monday-Friday and will follow-up for routine testing of Vitamin D, at least 2-3 times per year to avoid over-replacement.  3. Class 1 obesity with serious comorbidity and body mass index (BMI) of 30.0 to 30.9 in adult, unspecified obesity type Finleigh is currently in the action stage of change. As such, her goal is to continue with weight loss efforts. She has agreed to the Category 2 Plan.    Exercise goals: Cloie will add strength training 2 times per week.  Behavioral modification strategies: decreasing simple carbohydrates and decreasing eating out.  Christien's next follow-up appointment is to be determined, as she will be traveling. She will call to schedule. She was informed of the importance of frequent follow-up visits to maximize her success with intensive lifestyle modifications for her multiple health conditions.   Objective:   Blood pressure (!) 98/59, pulse 60, temperature 98.4 F (36.9 C), temperature source Oral, height 5\' 2"  (1.575 m), weight 154 lb (69.9 kg), SpO2 98 %. Body mass index is 28.17 kg/m.  General: Cooperative, alert, well developed, in no acute distress. HEENT: Conjunctivae and lids unremarkable. Cardiovascular: Regular rhythm.  Lungs: Normal work of breathing. Neurologic: No focal deficits.   Lab Results  Component Value Date   CREATININE 0.94 11/21/2019   BUN 26 11/21/2019   NA 135 11/21/2019   K 4.6 11/21/2019   CL 100 11/21/2019   CO2 23 11/21/2019   Lab Results  Component Value Date   ALT 34 (H) 11/21/2019   AST 28 11/21/2019   ALKPHOS 84 11/21/2019   BILITOT 0.3 11/21/2019   Lab Results  Component Value Date   HGBA1C 5.5 11/21/2019   HGBA1C 5.3 07/24/2019   HGBA1C 5.6 03/26/2019   HGBA1C 5.7 05/19/2018   Lab Results  Component Value Date   INSULIN 4.0 11/21/2019   INSULIN 5.2 07/24/2019   INSULIN 11.6 03/26/2019   Lab Results  Component  Value Date   TSH 2.190 11/21/2019   Lab Results  Component Value Date   CHOL 189 11/21/2019   HDL 52 11/21/2019   LDLCALC 121 (H) 11/21/2019   TRIG 87 11/21/2019   CHOLHDL 4.2 08/04/2017   Lab Results  Component Value Date   WBC 11.6 (H) 01/18/2018   HGB 14.6 01/18/2018   HCT 42.8 01/18/2018   MCV 91.3 01/18/2018   PLT 308 01/18/2018   No results found for: IRON, TIBC, FERRITIN  Obesity Behavioral Intervention Documentation for Insurance:   Approximately 15 minutes  were spent on the discussion below.  ASK: We discussed the diagnosis of obesity with Noor today and Harriette agreed to give [place] permission to discuss obesity behavioral modification therapy today.  ASSESS: Addine has the diagnosis of obesity and her BMI today is 28.16. Rasheen is in the action stage of change.   ADVISE: Paz was educated on the multiple health risks of obesity as well as the benefit of weight loss to improve her health. She was advised of the need for long term treatment and the importance of lifestyle modifications to improve her current health and to decrease her risk of future health problems.  AGREE: Multiple dietary modification options and treatment options were discussed and Caitlynne agreed to follow the recommendations documented in the above note.  ARRANGE: Danine was educated on the importance of frequent visits to treat obesity as outlined per CMS and USPSTF guidelines and agreed to schedule her next follow up appointment today.  Attestation Statements:   Reviewed by clinician on day of visit: allergies, medications, problem list, medical history, surgical history, family history, social history, and previous encounter notes.   Wilhemena Durie, am acting as Location manager for [employer], FNP-C.  I have reviewed the above documentation for accuracy and completeness, and I agree with the above. -  Georgianne Fick, FNP

## 2019-12-18 ENCOUNTER — Encounter: Payer: Self-pay | Admitting: Family Medicine

## 2019-12-18 ENCOUNTER — Ambulatory Visit (INDEPENDENT_AMBULATORY_CARE_PROVIDER_SITE_OTHER): Payer: PPO | Admitting: Family Medicine

## 2019-12-18 VITALS — BP 110/68 | HR 67 | Temp 97.5°F | Resp 12 | Ht 62.0 in | Wt 155.0 lb

## 2019-12-18 DIAGNOSIS — F418 Other specified anxiety disorders: Secondary | ICD-10-CM | POA: Diagnosis not present

## 2019-12-18 DIAGNOSIS — Z0000 Encounter for general adult medical examination without abnormal findings: Secondary | ICD-10-CM | POA: Diagnosis not present

## 2019-12-18 DIAGNOSIS — M159 Polyosteoarthritis, unspecified: Secondary | ICD-10-CM

## 2019-12-18 DIAGNOSIS — R7303 Prediabetes: Secondary | ICD-10-CM | POA: Diagnosis not present

## 2019-12-18 DIAGNOSIS — I10 Essential (primary) hypertension: Secondary | ICD-10-CM | POA: Diagnosis not present

## 2019-12-18 DIAGNOSIS — E7849 Other hyperlipidemia: Secondary | ICD-10-CM | POA: Diagnosis not present

## 2019-12-18 DIAGNOSIS — E038 Other specified hypothyroidism: Secondary | ICD-10-CM | POA: Diagnosis not present

## 2019-12-18 MED ORDER — DULOXETINE HCL 30 MG PO CPEP: 30.0000 mg | ORAL_CAPSULE | Freq: Every day | ORAL | 0 refills | Status: DC

## 2019-12-18 MED ORDER — ATORVASTATIN CALCIUM 10 MG PO TABS: 10.0000 mg | ORAL_TABLET | Freq: Every day | ORAL | 2 refills | Status: DC

## 2019-12-18 MED ORDER — LEVOTHYROXINE SODIUM 25 MCG PO TABS: 25.0000 ug | ORAL_TABLET | Freq: Every day | ORAL | 3 refills | Status: DC

## 2019-12-18 MED ORDER — PROPRANOLOL HCL 40 MG PO TABS: 20.0000 mg | ORAL_TABLET | Freq: Two times a day (BID) | ORAL | 2 refills | Status: DC

## 2019-12-18 NOTE — Assessment & Plan Note (Signed)
Problem is well controlled. Continue Atorvastatin 10 mg daily and low fat diet.

## 2019-12-18 NOTE — Assessment & Plan Note (Signed)
Continue following a healthful diet and regular physical activity. She is following with Weight management center.

## 2019-12-18 NOTE — Progress Notes (Signed)
Chief Complaint  Patient presents with  . Annual Exam   HPI: KristinaRonasia [name] is a 69 y.o. female, who is here today for her routine physical.  Last CPE: 09/20/17. She was last seen on 12/20/18.  Regular exercise 3 or more time per week: yes Following a healthy diet: currently seeing [facility], on a low carb diet, eating fruits and vegetables. Has lost 24 lbs She lives alone,  Chronic medical problems: Osteoporosis, hypertension, vitamin D deficiency, and anxiety among some.  Immunization History  Administered Date(s) Administered  . Influenza, High Dose Seasonal PF 05/03/2017, 05/05/2018, 05/10/2019, 06/18/2019  . Influenza-Unspecified 05/03/2017  . PFIZER SARS-COV-2 Vaccination 09/29/2019, 10/23/2019  . Pneumococcal Polysaccharide-23 05/03/2017  . Tdap 08/10/2007, 10/05/2019  . Zoster Recombinat (Shingrix) 05/10/2019, 06/18/2019   Mammogram: 10/03/17. Colonoscopy: 03/09/17. DEXA: 01/2018, osteoporosis. DEXA and mammogram already ordered by her gyn,Dr Nelda Marseille.  Hep C screening : 03/04/2017 NR.  Concerns today:  She had lab work recently. Joint pain: She is on Cymbalta 60 mg daily. She is not sure if medication is helping with arthralgias. She has seen rheumatologist in the past, negative work up for connective tissue disease or other serious rheumatologic disorder.  IFG:  Negative for polydipsia,polyuria, or polyphagia.  Lab Results  Component Value Date   HGBA1C 5.5 11/21/2019   HLD: She is on Atorvastatin 10 mg daily. Following low salt diet.  Lab Results  Component Value Date   CHOL 189 11/21/2019   HDL 52 11/21/2019   LDLCALC 121 (H) 11/21/2019   TRIG 87 11/21/2019   CHOLHDL 4.2 08/04/2017   HTN: BP was low during last visit with wt loss clinic. She is not longer on losartan. Takes Propranolol 40 mg bid for essential tremor.  Home BP readings 110's/70's. Negative for unusual headache,CP,or edema.  Lab Results  Component Value  Date   CREATININE 0.94 11/21/2019   BUN 26 11/21/2019   NA 135 11/21/2019   K 4.6 11/21/2019   CL 100 11/21/2019   CO2 23 11/21/2019   Anxiety and depression: Going through some stress. Her sister's husband died a few months ago, she lives in [place]. She takes Alprazolam 0.25 mg daily as needed, mainly at bedtime to help her sleep. Not sure if Cymbalta is helping. Some times she feels like an extra Alprazolam may help but she doe snot take more than prescribed. Negative for suicidal thoughts.  Depression screen [facility] 2/9 12/18/2019 03/26/2019 12/20/2018 12/16/2017  Decreased Interest 0 1 0 0  Down, Depressed, Hopeless 0 2 0 0  PHQ - 2 Score 0 3 0 0  Altered sleeping 1 0 - -  Tired, decreased energy 1 3 - -  Change in appetite 0 2 - -  Feeling bad or failure about yourself  0 2 - -  Trouble concentrating 0 1 - -  Moving slowly or fidgety/restless 0 1 - -  Suicidal thoughts 0 0 - -  PHQ-9 Score 2 12 - -  Difficult doing work/chores Not difficult at all Somewhat difficult - -   GAD 7 : Generalized Anxiety Score 12/18/2019  Nervous, Anxious, on Edge 1  Control/stop worrying 1  Worry too much - different things 1  Trouble relaxing 1  Restless 0  Easily annoyed or irritable 1  Afraid - awful might happen 0  Total GAD 7 Score 5  Anxiety Difficulty Somewhat difficult   Hypothyroidism:She is on Levothyroxine 25 mcg daily. She has not noted palpitations,heat intolerance,or worsening tremor.  Lab Results  Component Value Date   TSH 2.190 11/21/2019    Review of Systems  Constitutional: Positive for fatigue. Negative for appetite change and fever.  HENT: Negative for hearing loss, mouth sores and sore throat.   Eyes: Negative for redness and visual disturbance.  Respiratory: Negative for cough, shortness of breath and wheezing.   Cardiovascular: Negative for chest pain and leg swelling.  Gastrointestinal: Negative for abdominal pain, nausea and vomiting.       No changes in bowel  habits.  Endocrine: Negative for cold intolerance, polydipsia, polyphagia and polyuria.  Genitourinary: Negative for decreased urine volume, dysuria, hematuria, vaginal bleeding and vaginal discharge.  Musculoskeletal: Positive for arthralgias and back pain. Negative for gait problem.  Skin: Negative for color change and rash.  Allergic/Immunologic: Positive for environmental allergies.  Neurological: Negative for syncope, weakness and headaches.  Psychiatric/Behavioral: Positive for sleep disturbance. Negative for confusion. The patient is nervous/anxious.   All other systems reviewed and are negative.  Current Outpatient Medications on File Prior to Visit  Medication Sig Dispense Refill  . Acetaminophen (TYLENOL PO) Take 400 mg by mouth 2 (two) times daily.    Marland Kitchen albuterol (PROVENTIL HFA;VENTOLIN HFA) 108 (90 Base) MCG/ACT inhaler Inhale 2 puffs into the lungs every 6 (six) hours as needed for wheezing or shortness of breath. 1 Inhaler 2  . ALPRAZolam (XANAX) 0.25 MG tablet TAKE 1 TABLET BY MOUTH AT BEDTIME AS NEEDED FOR ANXIETY AND FOR SLEEP 30 tablet 3  . Azelaic Acid (FINACEA) 15 % cream Apply 1 application topically 2 (two) times daily. After skin is thoroughly washed and patted dry, gently but thoroughly massage a thin film of azelaic acid cream into the affected area twice daily, in the morning and evening.    . Calcium Carbonate-Vit D-Min (CALTRATE PLUS PO) Take by mouth daily.    . Cholecalciferol 100 MCG (4000 UT) CAPS Take 1 capsule (4,000 Units total) by mouth daily. 30 capsule 0  . cyclobenzaprine (FLEXERIL) 5 MG tablet     . estradiol (VIVELLE-DOT) 0.0375 MG/24HR APPLY 1 PATCH TOPICALLY TWICE A WEEK  8  . fluorometholone (FML) 0.1 % ophthalmic suspension     . Magnesium 250 MG TABS Take by mouth daily.    . meclizine (ANTIVERT) 12.5 MG tablet Take 1 tablet (12.5 mg total) by mouth 3 (three) times daily as needed for dizziness. 60 tablet 1  . Multiple Vitamin (MULTIVITAMIN) tablet  Take 1 tablet by mouth daily.    . pantoprazole (PROTONIX) 40 MG tablet Take 1 tablet by mouth once daily 90 tablet 2  . RESTASIS 0.05 % ophthalmic emulsion     . VOLTAREN 1 % GEL SMARTSIG:1 Sparingly Topical PRN    . SHINGRIX injection      No current facility-administered medications on file prior to visit.   Past Medical History:  Diagnosis Date  . Anxiety   . Arthritis   . Back pain   . Cancer ([place])    basal and squamous cell skin  . Chicken pox   . Chronic back pain   . COPD (chronic obstructive pulmonary disease) ([place])   . Depression   . Gallbladder problem   . GERD (gastroesophageal reflux disease)   . Hypertension   . Hypothyroidism   . Joint pain   . Lactose intolerance   . Migraines   . Osteoarthritis   . SOB (shortness of breath)   . Thyroid disease   . Tremor   . Vitamin D deficiency  Past Surgical History:  Procedure Laterality Date  . ABDOMINAL HYSTERECTOMY  1978  . CATARACT EXTRACTION Left 12/20/2017   will have the right one completed a month later   . CHOLECYSTECTOMY  2000  . COLONOSCOPY  2007  . JOINT REPLACEMENT Right 08/19/2017   great toe   . UPPER GASTROINTESTINAL ENDOSCOPY  [id number]    Allergies  Allergen Reactions  . Hydrocodone Nausea Only    Per patient   . Meloxicam Other (See Comments)    "skin infection on leg"  . Sulfa Antibiotics Rash    Rash all over body/fever    Family History  Problem Relation Age of Onset  . Arthritis Mother   . Diabetes Mother   . COPD Mother   . Heart failure Mother   . Stroke Father   . Hypertension Father   . Hyperlipidemia Father   . Diabetes Father   . CAD Father   . Heart disease Father   . Heart failure Father   . Breast cancer Maternal Aunt   . Cancer Sister        bladder cancer   . Throat cancer Brother   . Colon cancer Neg Hx   . Stomach cancer Neg Hx   . Esophageal cancer Neg Hx     Social History   Socioeconomic History  . Marital status: Single    Spouse name: Not on  file  . Number of children: 0  . Years of education: 77  . Highest education level: Not on file  Occupational History  . Occupation: retired    Comment: administration  Tobacco Use  . Smoking status: Former Smoker    Packs/day: 1.50    Years: 25.00    Pack years: 45.50    Quit date: 03/03/1990    Years since quitting: 29.8  . Smokeless tobacco: Never Used  Substance and Sexual Activity  . Alcohol use: Yes    Comment: glass of wine once a month  . Drug use: No  . Sexual activity: Never  Other Topics Concern  . Not on file  Social History Narrative   Lives alone   Caffeine- tea, 1 cup   Social Determinants of Health   Financial Resource Strain:   . Difficulty of Paying Living Expenses:   Food Insecurity:   . Worried About Charity fundraiser in the Last Year:   . Arboriculturist in the Last Year:   Transportation Needs:   . Film/video editor (Medical):   Marland Kitchen Lack of Transportation (Non-Medical):   Physical Activity:   . Days of Exercise per Week:   . Minutes of Exercise per Session:   Stress:   . Feeling of Stress :   Social Connections:   . Frequency of Communication with Friends and Family:   . Frequency of Social Gatherings with Friends and Family:   . Attends Religious Services:   . Active Member of Clubs or Organizations:   . Attends Archivist Meetings:   Marland Kitchen Marital Status:    Vitals:   12/18/19 0906  BP: 110/68  Pulse: 67  Temp: (!) 97.5 F (36.4 C)  SpO2: 99%   Body mass index is 28.35 kg/m.  Wt Readings from Last 3 Encounters:  12/18/19 155 lb (70.3 kg)  12/17/19 154 lb (69.9 kg)  11/21/19 153 lb (69.4 kg)   Physical Exam  Nursing note and vitals reviewed. Constitutional: She is oriented to person, place, and time. She appears well-developed. No distress.  HENT:  Head: Normocephalic and atraumatic.  Right Ear: Hearing, tympanic membrane, external ear and ear canal normal.  Left Ear: Hearing, tympanic membrane, external ear and  ear canal normal.  Mouth/Throat: Uvula is midline, oropharynx is clear and moist and mucous membranes are normal.  Eyes: Pupils are equal, round, and reactive to light. Conjunctivae and EOM are normal.  Neck: No tracheal deviation present. No thyromegaly present.  Cardiovascular: Normal rate and regular rhythm.  No murmur heard. Pulses:      Dorsalis pedis pulses are 2+ on the right side and 2+ on the left side.  Respiratory: Effort normal and breath sounds normal. No respiratory distress.  GI: Soft. She exhibits no mass. There is no hepatomegaly. There is no abdominal tenderness.  Genitourinary:    Genitourinary Comments: Deferred to gyn.   Musculoskeletal:        General: No edema.     Comments: No signs of synovitis appreciated.  Lymphadenopathy:    She has no cervical adenopathy.       Right: No supraclavicular adenopathy present.       Left: No supraclavicular adenopathy present.  Neurological: She is alert and oriented to person, place, and time. She has normal strength. No cranial nerve deficit. Coordination and gait normal.  Reflex Scores:      Bicep reflexes are 2+ on the right side and 2+ on the left side.      Patellar reflexes are 2+ on the right side and 2+ on the left side. Skin: Skin is warm. No rash noted. No erythema.  Psychiatric: Her mood appears anxious. Cognition and memory are normal.  Well groomed, good eye contact.    ASSESSMENT AND PLAN:  Ms. Charity Devivo was here today annual physical examination.  Routine general medical examination at a health care facility We discussed the importance of regular physical activity and healthy diet for prevention of chronic illness and/or complications. Preventive guidelines reviewed. Vaccination up to date.  Ca++ and vit D supplementation to continue. Next CPE in a year.  The 10-year ASCVD risk score Mikey Bussing DC Brooke Bonito., et al., 2013) is: 8.4%   Values used to calculate the score:     Age: 61 years     Sex: Female      Is Non-Hispanic African American: No     Diabetic: No     Tobacco smoker: No     Systolic Blood Pressure: [id number] mmHg     Is BP treated: Yes     HDL Cholesterol: 52 mg/dL     Total Cholesterol: 189 mg/dL  Generalized osteoarthritis of multiple sites She is not sure if Cymbalta is helping, so recommend decreasing dose and continue weaning of medication. If she notices that joint pain gets worse, she can go back to Cymbalta 60 mg daily.  Essential hypertension BP mildly low, so decrease Propranolol from 40 mg bid to 20 mg bid. Continue monitoring BP regularly. F/U in 3 months.   Anxiety disorder Problem is not well controlled. Cymbalta to be weaned off. Continue Alprazolam 0.25 daily as needed.   Prediabetes Continue following a healthful diet and regular physical activity. She is following with Weight management center.  Hyperlipidemia Problem is well controlled. Continue Atorvastatin 10 mg daily and low fat diet.  Hypothyroidism Well controlled. Continue Levothyroxine 25 mcg daily.   Return in 3 months (on 03/19/2020) for anxiety and AWV.  Tatisha Cerino G. [place], MD  [facility]. [place] office.

## 2019-12-18 NOTE — Patient Instructions (Addendum)
Today you have you routine preventive visit. A few things to remember from today's visit:   Routine general medical examination at a health care facility  Essential hypertension  Prediabetes  Other hyperlipidemia - Plan: atorvastatin (LIPITOR) 10 MG tablet  Other specified anxiety disorders  Generalized osteoarthritis of multiple sites  If you need refills please call your pharmacy. Do not use My Chart to request refills or for acute issues that need immediate attention.   Cymbalta decreased from 60 mg to 30 mg daily. Continue 30 mg daily for 2 weeks then every other day for 2 weeks. Let me know when you are taking med every other day, so we can add a new med for anxiety. Decrease Propranolol from 40 mg 2 times daily to 20 mg 2 times daily.  Please be sure medication list is accurate. If a new problem present, please set up appointment sooner than planned today.  At least 150 minutes of moderate exercise per week. Healthy diet low in saturated (animal) fats and sweets and consisting of fresh fruits and vegetables, lean meats such as fish and white chicken and whole grains.  These are some of recommendations for screening depending of age and risk factors:  - Vaccines:  Tdap vaccine every 10 years.  Shingles vaccine recommended at age 58, could be given after 70 years of age but not sure about insurance coverage.   Pneumonia vaccines: Pneumovax at 45. Sometimes Pneumovax is giving earlier if history of smoking, lung disease,diabetes,kidney disease among some.  Screening for diabetes at age 37 and every 3 years.  Cervical cancer prevention:  N/A  -Breast cancer: Mammogram: There is disagreement between experts about when to start screening in low risk asymptomatic female but recent recommendations are to start screening at 78 and not later than 70 years old , every 1-2 years and after 70 yo q 2 years. Screening is recommended until 70 years old but some women can continue  screening depending of healthy issues.  Colon cancer screening: starts at 70 years old until 70 years old.  Cholesterol disorder screening at age 67 and every 3 years.N/A Also recommended:  1. Dental visit- Brush and floss your teeth twice daily; visit your dentist twice a year. 2. Eye doctor- Get an eye exam at least every 2 years. 3. Helmet use- Always wear a helmet when riding a bicycle, motorcycle, rollerblading or skateboarding. 4. Safe sex- If you may be exposed to sexually transmitted infections, use a condom. 5. Seat belts- Seat belts can save your live; always wear one. 6. Smoke/Carbon Monoxide detectors- These detectors need to be installed on the appropriate level of your home. Replace batteries at least once a year. 7. Skin cancer- When out in the sun please cover up and use sunscreen 15 SPF or higher. 8. Violence- If anyone is threatening or hurting you, please tell your healthcare provider.  9. Drink alcohol in moderation- Limit alcohol intake to one drink or less per day. Never drink and drive.  A few tips:  -As we age balance is not as good as it was, so there is a higher risks for falls. Please remove small rugs and furniture that is "in your way" and could increase the risk of falls. Stretching exercises may help with fall prevention: Yoga and Tai Chi are some examples. Low impact exercise is better, so you are not very achy the next day.  -Sun screen and avoidance of direct sun light recommended. Caution with dehydration, if working outdoors be  sure to drink enough fluids.  - Some medications are not safe as we age, increases the risk of side effects and can potentially interact with other medication you are also taken;  including some of over the counter medications. Be sure to let me know when you start a new medication even if it is a dietary/vitamin supplement.   -Healthy diet low in red meet/animal fat and sugar + regular physical activity is recommended.

## 2019-12-18 NOTE — Assessment & Plan Note (Signed)
She is not sure if Cymbalta is helping, so recommend decreasing dose and continue weaning of medication. If she notices that joint pain gets worse, she can go back to Cymbalta 60 mg daily.

## 2019-12-18 NOTE — Assessment & Plan Note (Signed)
BP mildly low, so decrease Propranolol from 40 mg bid to 20 mg bid. Continue monitoring BP regularly. F/U in 3 months.

## 2019-12-18 NOTE — Assessment & Plan Note (Signed)
Well controlled. Continue Levothyroxine 25 mcg daily.

## 2019-12-18 NOTE — Assessment & Plan Note (Addendum)
Problem is not well controlled. Cymbalta to be weaned off. Continue Alprazolam 0.25 daily as needed.

## 2019-12-20 ENCOUNTER — Encounter: Payer: Self-pay | Admitting: Rheumatology

## 2019-12-20 DIAGNOSIS — H35013 Changes in retinal vascular appearance, bilateral: Secondary | ICD-10-CM | POA: Diagnosis not present

## 2019-12-20 DIAGNOSIS — H524 Presbyopia: Secondary | ICD-10-CM | POA: Diagnosis not present

## 2019-12-20 DIAGNOSIS — H26493 Other secondary cataract, bilateral: Secondary | ICD-10-CM | POA: Diagnosis not present

## 2019-12-20 DIAGNOSIS — Z961 Presence of intraocular lens: Secondary | ICD-10-CM | POA: Diagnosis not present

## 2019-12-20 DIAGNOSIS — H43813 Vitreous degeneration, bilateral: Secondary | ICD-10-CM | POA: Diagnosis not present

## 2019-12-22 ENCOUNTER — Encounter: Payer: Self-pay | Admitting: Family Medicine

## 2019-12-25 ENCOUNTER — Other Ambulatory Visit: Payer: Self-pay | Admitting: Obstetrics & Gynecology

## 2019-12-25 DIAGNOSIS — Z780 Asymptomatic menopausal state: Secondary | ICD-10-CM

## 2019-12-27 ENCOUNTER — Encounter: Payer: Self-pay | Admitting: Family Medicine

## 2019-12-27 NOTE — Telephone Encounter (Signed)
Please advise 

## 2020-01-14 ENCOUNTER — Ambulatory Visit: Payer: PPO | Admitting: Podiatry

## 2020-01-14 ENCOUNTER — Ambulatory Visit (INDEPENDENT_AMBULATORY_CARE_PROVIDER_SITE_OTHER): Payer: PPO

## 2020-01-14 ENCOUNTER — Encounter: Payer: Self-pay | Admitting: Podiatry

## 2020-01-14 ENCOUNTER — Other Ambulatory Visit: Payer: Self-pay

## 2020-01-14 DIAGNOSIS — M205X1 Other deformities of toe(s) (acquired), right foot: Secondary | ICD-10-CM | POA: Diagnosis not present

## 2020-01-14 DIAGNOSIS — M722 Plantar fascial fibromatosis: Secondary | ICD-10-CM

## 2020-01-14 DIAGNOSIS — M778 Other enthesopathies, not elsewhere classified: Secondary | ICD-10-CM | POA: Diagnosis not present

## 2020-01-14 DIAGNOSIS — M25512 Pain in left shoulder: Secondary | ICD-10-CM | POA: Diagnosis not present

## 2020-01-14 DIAGNOSIS — M7502 Adhesive capsulitis of left shoulder: Secondary | ICD-10-CM | POA: Diagnosis not present

## 2020-01-14 MED ORDER — METHYLPREDNISOLONE 4 MG PO TBPK
ORAL_TABLET | ORAL | 0 refills | Status: DC
Start: 2020-01-14 — End: 2020-02-19

## 2020-01-14 NOTE — Progress Notes (Signed)
She presents today states that my big toe seems to be shrinking from of the [place] arthroplasty and my middle toes are cramping more now.  She says her right foot is painful when she was walking with sharp pain stabbing pains and has been hurting in the past 6 months states it only been taking Tylenol for the discomfort.  Objective: Vital signs are stable alert oriented x3.  Pulses are palpable.  She has pain on palpation medial calcaneal tubercles.  She has pain on palpation and end range of motion of the second metatarsophalangeal joint right foot.  She also has pain on palpation to the fourth fifth TMT joint right foot.  Great range of motion of the first metatarsophalangeal joint.  Radiographs taken today demonstrate osseously mature individual some mild osteopenia.  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  She also has soft tissue increase in density plantar fascial kidney insertion site of her right.  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  Plan: At this point start her on a methylprednisolone and injected the right heel.  Dispensed a plantar fascial brace and I will follow-up with her in 1 month or so.  Discussed appropriate shoe gear stretching exercise ice therapy sugar modifications.

## 2020-01-17 ENCOUNTER — Encounter: Payer: Self-pay | Admitting: Family Medicine

## 2020-01-17 NOTE — Telephone Encounter (Signed)
FYI

## 2020-01-22 ENCOUNTER — Other Ambulatory Visit: Payer: Self-pay | Admitting: *Deleted

## 2020-01-22 DIAGNOSIS — I10 Essential (primary) hypertension: Secondary | ICD-10-CM

## 2020-01-22 DIAGNOSIS — K219 Gastro-esophageal reflux disease without esophagitis: Secondary | ICD-10-CM

## 2020-01-22 MED ORDER — PANTOPRAZOLE SODIUM 40 MG PO TBEC: 40.0000 mg | DELAYED_RELEASE_TABLET | Freq: Every day | ORAL | 2 refills | Status: DC

## 2020-01-22 MED ORDER — PROPRANOLOL HCL 40 MG PO TABS: 20.0000 mg | ORAL_TABLET | Freq: Two times a day (BID) | ORAL | 2 refills | Status: DC

## 2020-01-24 ENCOUNTER — Telehealth: Payer: Self-pay | Admitting: Family Medicine

## 2020-01-24 DIAGNOSIS — M25512 Pain in left shoulder: Secondary | ICD-10-CM | POA: Diagnosis not present

## 2020-01-24 NOTE — Telephone Encounter (Signed)
Hope from [place] stated that the manufacturer for Levothyroxine has changed to [facility] and they need approval from PCP    [place] [phone] Ref# [id number]

## 2020-01-25 ENCOUNTER — Other Ambulatory Visit: Payer: Self-pay | Admitting: Family Medicine

## 2020-01-25 DIAGNOSIS — E038 Other specified hypothyroidism: Secondary | ICD-10-CM

## 2020-01-25 MED ORDER — SYNTHROID 25 MCG PO TABS: 25.0000 ug | ORAL_TABLET | Freq: Every day | ORAL | 2 refills | Status: DC

## 2020-01-25 NOTE — Telephone Encounter (Signed)
Rx for Synthroid 25 mcg sent, brand name. We can repeat TSH next visit. Thanks, BJ

## 2020-01-25 NOTE — Telephone Encounter (Signed)
Patient notified and verbalized understanding. 

## 2020-01-25 NOTE — Telephone Encounter (Signed)
Ok to change the levothyroxine to lanette? Please advise

## 2020-01-25 NOTE — Telephone Encounter (Signed)
Please advise 

## 2020-01-28 DIAGNOSIS — M25512 Pain in left shoulder: Secondary | ICD-10-CM | POA: Diagnosis not present

## 2020-01-29 ENCOUNTER — Encounter: Payer: Self-pay | Admitting: Family Medicine

## 2020-01-29 ENCOUNTER — Other Ambulatory Visit: Payer: Self-pay | Admitting: *Deleted

## 2020-01-29 DIAGNOSIS — E038 Other specified hypothyroidism: Secondary | ICD-10-CM

## 2020-01-29 MED ORDER — SYNTHROID 25 MCG PO TABS
25.0000 ug | ORAL_TABLET | Freq: Every day | ORAL | 2 refills | Status: DC
Start: 2020-01-29 — End: 2020-11-24

## 2020-01-30 ENCOUNTER — Encounter: Payer: Self-pay | Admitting: Family Medicine

## 2020-01-30 DIAGNOSIS — M25512 Pain in left shoulder: Secondary | ICD-10-CM | POA: Diagnosis not present

## 2020-02-19 ENCOUNTER — Other Ambulatory Visit: Payer: Self-pay

## 2020-02-19 DIAGNOSIS — F418 Other specified anxiety disorders: Secondary | ICD-10-CM

## 2020-02-19 DIAGNOSIS — M159 Polyosteoarthritis, unspecified: Secondary | ICD-10-CM

## 2020-02-19 MED ORDER — DULOXETINE HCL 30 MG PO CPEP: 30.0000 mg | ORAL_CAPSULE | Freq: Every day | ORAL | 1 refills | Status: DC

## 2020-02-27 ENCOUNTER — Ambulatory Visit (INDEPENDENT_AMBULATORY_CARE_PROVIDER_SITE_OTHER): Payer: PPO | Admitting: Podiatry

## 2020-02-27 ENCOUNTER — Other Ambulatory Visit: Payer: Self-pay

## 2020-02-27 ENCOUNTER — Encounter: Payer: Self-pay | Admitting: Podiatry

## 2020-02-27 DIAGNOSIS — M778 Other enthesopathies, not elsewhere classified: Secondary | ICD-10-CM | POA: Diagnosis not present

## 2020-02-27 NOTE — Progress Notes (Signed)
She presents today for follow-up of her right heel.  States that is the same she states that it no longer hurts around the heel or the outside but the forefoot is still painful.  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  She states is been this way for many years and results in cramping.     Assessment: Forefoot capsulitis second and third metatarsals right foot.  Resolution of fasciitis and lateral compensatory syndrome.  Plan: I offered her orthotics today physical therapy and injection she declined all at first and she decided she would rather do an injection to see if it would help.  So at this point we injected her with 10 mg of Kenalog 5 mg Marcaine point maximal tenderness.  And I will follow-up with her in a few weeks.

## 2020-02-28 ENCOUNTER — Telehealth: Payer: Self-pay | Admitting: Family Medicine

## 2020-02-28 NOTE — Telephone Encounter (Signed)
Left message for patient to schedule Annual Wellness Visit.  Please schedule with Nurse Health Advisor Shannon Crews, RN at [facility]  

## 2020-03-04 ENCOUNTER — Other Ambulatory Visit: Payer: Self-pay

## 2020-03-07 ENCOUNTER — Ambulatory Visit
Admission: RE | Admit: 2020-03-07 | Discharge: 2020-03-07 | Disposition: A | Discharge status: Home / Self Care | Payer: PPO | Source: Ambulatory Visit | Attending: Obstetrics & Gynecology | Admitting: Obstetrics & Gynecology

## 2020-03-07 ENCOUNTER — Other Ambulatory Visit: Payer: Self-pay

## 2020-03-07 DIAGNOSIS — Z1231 Encounter for screening mammogram for malignant neoplasm of breast: Secondary | ICD-10-CM | POA: Diagnosis not present

## 2020-03-07 DIAGNOSIS — Z780 Asymptomatic menopausal state: Secondary | ICD-10-CM

## 2020-03-07 DIAGNOSIS — M85851 Other specified disorders of bone density and structure, right thigh: Secondary | ICD-10-CM | POA: Diagnosis not present

## 2020-03-18 NOTE — Patient Instructions (Addendum)
A few things to remember from today's visit:   Generalized osteoarthritis of multiple sites  Essential hypertension  Other specified anxiety disorders - Plan: DULoxetine (CYMBALTA) 20 MG capsule, PARoxetine (PAXIL) 10 MG tablet  Difficulty breathing - Plan: DG Chest 2 View  Hot flash, menopausal - Plan: PARoxetine (PAXIL) 10 MG tablet  Insomnia, unspecified type - Plan: ALPRAZolam (XANAX) 0.25 MG tablet  Cymbalta 20 mg to take daily x 2 weeks then every other day for 2 weeks,then every 3rd day for 2 weeks and stop. Paroxetine started today to help with hot flashes and anxiety. Xanax increased from 30 tab to 45 tab to take 2 times daily as needed,  Chest X ray ordered today  If you need refills please call your pharmacy. Do not use My Chart to request refills or for acute issues that need immediate attention.    Please be sure medication list is accurate. If a new problem present, please set up appointment sooner than planned today.   If we have ordered labs or studies at this visit, it can take up to 1-2 weeks for results and processing. IF results require follow up or explanation, we will call you with instructions. Clinically stable results will be released to your [facility]. If you have not heard from [place] or cannot find your results in [facility] in 2 weeks please contact our office at [phone].  If you are not yet signed up for [facility], please consider signing up.

## 2020-03-19 ENCOUNTER — Ambulatory Visit (INDEPENDENT_AMBULATORY_CARE_PROVIDER_SITE_OTHER): Payer: PPO | Admitting: Family Medicine

## 2020-03-19 ENCOUNTER — Other Ambulatory Visit: Payer: Self-pay

## 2020-03-19 ENCOUNTER — Ambulatory Visit (INDEPENDENT_AMBULATORY_CARE_PROVIDER_SITE_OTHER)
Admission: RE | Admit: 2020-03-19 | Discharge: 2020-03-19 | Disposition: A | Discharge status: Home / Self Care | Payer: PPO | Source: Ambulatory Visit | Attending: Family Medicine | Admitting: Family Medicine

## 2020-03-19 ENCOUNTER — Encounter: Payer: Self-pay | Admitting: Family Medicine

## 2020-03-19 VITALS — BP 122/70 | HR 78 | Temp 98.3°F | Resp 16 | Ht 62.0 in | Wt 166.4 lb

## 2020-03-19 DIAGNOSIS — G4700 Insomnia, unspecified: Secondary | ICD-10-CM | POA: Diagnosis not present

## 2020-03-19 DIAGNOSIS — M545 Low back pain, unspecified: Secondary | ICD-10-CM

## 2020-03-19 DIAGNOSIS — R0689 Other abnormalities of breathing: Secondary | ICD-10-CM

## 2020-03-19 DIAGNOSIS — N951 Menopausal and female climacteric states: Secondary | ICD-10-CM | POA: Diagnosis not present

## 2020-03-19 DIAGNOSIS — J449 Chronic obstructive pulmonary disease, unspecified: Secondary | ICD-10-CM | POA: Diagnosis not present

## 2020-03-19 DIAGNOSIS — M159 Polyosteoarthritis, unspecified: Secondary | ICD-10-CM

## 2020-03-19 DIAGNOSIS — F418 Other specified anxiety disorders: Secondary | ICD-10-CM

## 2020-03-19 DIAGNOSIS — G8929 Other chronic pain: Secondary | ICD-10-CM

## 2020-03-19 DIAGNOSIS — I10 Essential (primary) hypertension: Secondary | ICD-10-CM | POA: Diagnosis not present

## 2020-03-19 DIAGNOSIS — F17200 Nicotine dependence, unspecified, uncomplicated: Secondary | ICD-10-CM | POA: Diagnosis not present

## 2020-03-19 DIAGNOSIS — R21 Rash and other nonspecific skin eruption: Secondary | ICD-10-CM | POA: Diagnosis not present

## 2020-03-19 MED ORDER — CYCLOBENZAPRINE HCL 5 MG PO TABS: 5.0000 mg | ORAL_TABLET | Freq: Every day | ORAL | 1 refills | Status: DC

## 2020-03-19 MED ORDER — DULOXETINE HCL 20 MG PO CPEP: 20.0000 mg | ORAL_CAPSULE | Freq: Every day | ORAL | 0 refills | Status: DC

## 2020-03-19 MED ORDER — ALPRAZOLAM 0.25 MG PO TABS: 0.2500 mg | ORAL_TABLET | Freq: Two times a day (BID) | ORAL | 3 refills | Status: DC | PRN

## 2020-03-19 MED ORDER — PAROXETINE HCL 10 MG PO TABS: 10.0000 mg | ORAL_TABLET | Freq: Every day | ORAL | 2 refills | Status: DC

## 2020-03-19 NOTE — Progress Notes (Signed)
HPI: [name] is a 69 y.o. female, who is here today to follow on some chronic medical problems. She was last seen on 12/18/19 for her CPE.  HTN: She is on Propranolol 20 mg bid (also for tremor) BP readings at home are "good." Negative for frequent headache,chest pain, dyspnea,claudication, focal weakness, or edema.  Lab Results  Component Value Date   CREATININE 0.94 11/21/2019   BUN 26 11/21/2019   NA 135 11/21/2019   K 4.6 11/21/2019   CL 100 11/21/2019   CO2 23 11/21/2019   Feeling sometimes like she cannot get enough air with one breath. She thinks it may be related with anxiety. Negative for cough,wheezing,or diaphoresis.  Anxiety: She is on Alprazolam 0.25 mg daily as needed. She feels like medication just helps for a few hours.  Last visit we planned on starting weaning off Cymbalta because it was not helping with generalized OA nor anxiety. She went back to Cymbalta 30 mg because did not feel good when she change to q 2 days. She is now back to her baseline.  She takes Tylenol for joint pain. No joint erythema or significant limitation of ROM.  Having hot flashes that interferes with sleep. She follows with gyn and on hormonal therapy. Tremor is stable. Negative for abnormal wt loss.  Lower back pain,occasionally radiated to LLE. She takes Flexeril 5 mg daily and it helps. Negative for saddle anesthesia or bladder/bowel dysfunction.  Review of Systems  Constitutional: Positive for fatigue. Negative for activity change, appetite change and fever.  HENT: Negative for mouth sores, nosebleeds and sore throat.   Eyes: Negative for redness and visual disturbance.  Gastrointestinal: Negative for abdominal pain, nausea and vomiting.       Negative for changes in bowel habits.  Genitourinary: Negative for decreased urine volume and hematuria.  Neurological: Negative for syncope, facial asymmetry and weakness.  Psychiatric/Behavioral: Positive for sleep  disturbance. Negative for confusion. The patient is nervous/anxious.   Rest see pertinent positives and negatives per HPI.  Current Outpatient Medications on File Prior to Visit  Medication Sig Dispense Refill  . Acetaminophen (TYLENOL PO) Take 400 mg by mouth 2 (two) times daily.    Marland Kitchen albuterol (PROVENTIL HFA;VENTOLIN HFA) 108 (90 Base) MCG/ACT inhaler Inhale 2 puffs into the lungs every 6 (six) hours as needed for wheezing or shortness of breath. 1 Inhaler 2  . atorvastatin (LIPITOR) 10 MG tablet Take 1 tablet (10 mg total) by mouth daily. 90 tablet 2  . Azelaic Acid (FINACEA) 15 % cream Apply 1 application topically 2 (two) times daily. After skin is thoroughly washed and patted dry, gently but thoroughly massage a thin film of azelaic acid cream into the affected area twice daily, in the morning and evening.    . Calcium Carbonate-Vit D-Min (CALTRATE PLUS PO) Take by mouth daily.    . Cholecalciferol 100 MCG (4000 UT) CAPS Take 1 capsule (4,000 Units total) by mouth daily. 30 capsule 0  . DULoxetine (CYMBALTA) 30 MG capsule Take 1 capsule (30 mg total) by mouth daily. 90 capsule 1  . estradiol (VIVELLE-DOT) 0.0375 MG/24HR APPLY 1 PATCH TOPICALLY TWICE A WEEK  8  . Magnesium 250 MG TABS Take by mouth daily.    . meclizine (ANTIVERT) 12.5 MG tablet Take 1 tablet (12.5 mg total) by mouth 3 (three) times daily as needed for dizziness. 60 tablet 1  . Multiple Vitamin (MULTIVITAMIN) tablet Take 1 tablet by mouth daily.    . pantoprazole (  PROTONIX) 40 MG tablet Take 1 tablet (40 mg total) by mouth daily. 90 tablet 2  . propranolol (INDERAL) 40 MG tablet Take 0.5 tablets (20 mg total) by mouth 2 (two) times daily. 180 tablet 2  . RESTASIS 0.05 % ophthalmic emulsion     . SHINGRIX injection     . SYNTHROID 25 MCG tablet Take 1 tablet (25 mcg total) by mouth daily before breakfast. 90 tablet 2  . VOLTAREN 1 % GEL SMARTSIG:1 Sparingly Topical PRN     No current facility-administered medications on  file prior to visit.   Past Medical History:  Diagnosis Date  . Anxiety   . Arthritis   . Back pain   . Cancer ([place])    basal and squamous cell skin  . Chicken pox   . Chronic back pain   . COPD (chronic obstructive pulmonary disease) ([place])   . Depression   . Gallbladder problem   . GERD (gastroesophageal reflux disease)   . Hypertension   . Hypothyroidism   . Joint pain   . Lactose intolerance   . Migraines   . Osteoarthritis   . SOB (shortness of breath)   . Thyroid disease   . Tremor   . Vitamin D deficiency    Allergies  Allergen Reactions  . Hydrocodone Nausea Only    Per patient   . Meloxicam Other (See Comments)    "skin infection on leg"  . Sulfa Antibiotics Rash    Rash all over body/fever    Social History   Socioeconomic History  . Marital status: Single    Spouse name: Not on file  . Number of children: 0  . Years of education: 22  . Highest education level: Not on file  Occupational History  . Occupation: retired    Comment: administration  Tobacco Use  . Smoking status: Former Smoker    Packs/day: 1.50    Years: 25.00    Pack years: 37.50    Quit date: 03/03/1990    Years since quitting: 30.0  . Smokeless tobacco: Never Used  Vaping Use  . Vaping Use: Never used  Substance and Sexual Activity  . Alcohol use: Yes    Comment: glass of wine once a month  . Drug use: No  . Sexual activity: Never  Other Topics Concern  . Not on file  Social History Narrative   Lives alone   Caffeine- tea, 1 cup   Social Determinants of Health   Financial Resource Strain:   . Difficulty of Paying Living Expenses:   Food Insecurity:   . Worried About Charity fundraiser in the Last Year:   . Arboriculturist in the Last Year:   Transportation Needs:   . Film/video editor (Medical):   Marland Kitchen Lack of Transportation (Non-Medical):   Physical Activity:   . Days of Exercise per Week:   . Minutes of Exercise per Session:   Stress:   . Feeling of Stress  :   Social Connections:   . Frequency of Communication with Friends and Family:   . Frequency of Social Gatherings with Friends and Family:   . Attends Religious Services:   . Active Member of Clubs or Organizations:   . Attends Archivist Meetings:   Marland Kitchen Marital Status:     Vitals:   03/19/20 0947  BP: 122/70  Pulse: 78  Resp: 16  Temp: 98.3 F (36.8 C)  SpO2: 96%   Wt Readings from Last  3 Encounters:  03/19/20 166 lb 6 oz (75.5 kg)  12/18/19 155 lb (70.3 kg)  12/17/19 154 lb (69.9 kg)   Body mass index is 30.43 kg/m.  Physical Exam Vitals and nursing note reviewed.  Constitutional:      General: She is not in acute distress.    Appearance: She is well-developed.  HENT:     Head: Normocephalic and atraumatic.  Eyes:     Conjunctiva/sclera: Conjunctivae normal.     Pupils: Pupils are equal, round, and reactive to light.  Cardiovascular:     Rate and Rhythm: Normal rate and regular rhythm.     Pulses:          Dorsalis pedis pulses are 2+ on the right side and 2+ on the left side.     Heart sounds: No murmur heard.   Pulmonary:     Effort: Pulmonary effort is normal. No respiratory distress.     Breath sounds: Normal breath sounds.  Abdominal:     Palpations: Abdomen is soft. There is no hepatomegaly or mass.     Tenderness: There is no abdominal tenderness.  Lymphadenopathy:     Cervical: No cervical adenopathy.  Skin:    General: Skin is warm.     Findings: No erythema or rash.  Neurological:     Mental Status: She is alert and oriented to person, place, and time.     Cranial Nerves: No cranial nerve deficit.     Gait: Gait normal.  Psychiatric:        Mood and Affect: Affect normal. Mood is anxious.     Comments: Well groomed, good eye contact.    ASSESSMENT AND PLAN:  Ms. Sister was seen today for follow-up.  Diagnoses and all orders for this visit:  Orders Placed This Encounter  Procedures  . DG Chest 2 View    Difficulty  breathing Could be related to anxiety. Examination today and Hx do not suggest a serious process. She had CXR in 10/2016: Mild hyperinflation and interstitial prominence. Further recommendations according to CXR.  Hot flash, menopausal Still symptomatic despite of hormonal therapy. We discussed non hormonal treatment options, she would like to try Paroxetine. Some side effects discussed.  -     PARoxetine (PAXIL) 10 MG tablet; Take 1 tablet (10 mg total) by mouth daily.  Insomnia, unspecified type Good sleep hygiene. Continue Alprazolam 0.25 mg at bedtime as needed.  -     ALPRAZolam (XANAX) 0.25 MG tablet; Take 1 tablet (0.25 mg total) by mouth 2 (two) times daily as needed for anxiety.  Chronic low back pain, unspecified back pain laterality, unspecified whether sciatica present Stable. She has tolerated Flexeril well. No changes in current management.  -     cyclobenzaprine (FLEXERIL) 5 MG tablet; Take 1 tablet (5 mg total) by mouth at bedtime.  Essential hypertension BP is adequately controlled. No changes in current management. Low salt diet to continue.  Anxiety disorder Increased dose of Xanax from 1 tab qd to bid prn. Paroxetine 10 mg added. Instructed to let me know in 6 weeks if changes are helping.  Generalized osteoarthritis of multiple sites Tylenol 500 mg max 3 g  Daily divided 3-4 times daily.   Return in about 5 months (around 08/19/2020) for HTN,anxiety,insomnia.     Clariece Roesler G. Martinique, MD  [facility]. [place] office.  A few things to remember from today's visit:   Generalized osteoarthritis of multiple sites  Essential hypertension  Other specified anxiety  disorders - Plan: DULoxetine (CYMBALTA) 20 MG capsule, PARoxetine (PAXIL) 10 MG tablet  Difficulty breathing - Plan: DG Chest 2 View  Hot flash, menopausal - Plan: PARoxetine (PAXIL) 10 MG tablet  Insomnia, unspecified type - Plan: ALPRAZolam (XANAX) 0.25 MG tablet  Cymbalta  20 mg to take daily x 2 weeks then every other day for 2 weeks,then every 3rd day for 2 weeks and stop. Paroxetine started today to help with hot flashes and anxiety. Xanax increased from 30 tab to 45 tab to take 2 times daily as needed,  Chest X ray ordered today  If you need refills please call your pharmacy. Do not use My Chart to request refills or for acute issues that need immediate attention.    Please be sure medication list is accurate. If a new problem present, please set up appointment sooner than planned today.   If we have ordered labs or studies at this visit, it can take up to 1-2 weeks for results and processing. IF results require follow up or explanation, we will call you with instructions. Clinically stable results will be released to your [facility]. If you have not heard from [place] or cannot find your results in [facility] in 2 weeks please contact our office at [phone].  If you are not yet signed up for [facility], please consider signing up.

## 2020-03-19 NOTE — Assessment & Plan Note (Signed)
Tylenol 500 mg max 3 g  Daily divided 3-4 times daily.

## 2020-03-19 NOTE — Assessment & Plan Note (Addendum)
Increased dose of Xanax from 1 tab qd to bid prn. Paroxetine 10 mg added. Instructed to let me know in 6 weeks if changes are helping.

## 2020-03-19 NOTE — Assessment & Plan Note (Signed)
BP is adequately controlled. No changes in current management. Low salt diet to continue.

## 2020-03-24 ENCOUNTER — Other Ambulatory Visit: Payer: Self-pay | Admitting: Family Medicine

## 2020-03-24 DIAGNOSIS — I10 Essential (primary) hypertension: Secondary | ICD-10-CM

## 2020-04-09 ENCOUNTER — Ambulatory Visit: Payer: PPO | Admitting: Podiatry

## 2020-05-21 ENCOUNTER — Other Ambulatory Visit: Payer: Self-pay

## 2020-05-21 DIAGNOSIS — L719 Rosacea, unspecified: Secondary | ICD-10-CM

## 2020-05-21 MED ORDER — AZELAIC ACID 15 % EX GEL: 1.0000 "application " | Freq: Two times a day (BID) | CUTANEOUS | 1 refills | Status: DC

## 2020-05-21 NOTE — Progress Notes (Signed)
Refill from [facility]

## 2020-05-22 ENCOUNTER — Encounter: Payer: Self-pay | Admitting: Family Medicine

## 2020-05-22 DIAGNOSIS — F418 Other specified anxiety disorders: Secondary | ICD-10-CM

## 2020-05-22 DIAGNOSIS — N951 Menopausal and female climacteric states: Secondary | ICD-10-CM

## 2020-05-23 MED ORDER — PAROXETINE HCL 10 MG PO TABS: 10.0000 mg | ORAL_TABLET | Freq: Every day | ORAL | 1 refills | Status: DC

## 2020-05-23 NOTE — Addendum Note (Signed)
Addended by: Rodrigo Ran on: 05/23/2020 04:16 PM   Modules accepted: Orders

## 2020-06-17 ENCOUNTER — Encounter: Payer: PPO | Admitting: Dermatology

## 2020-07-09 ENCOUNTER — Other Ambulatory Visit: Payer: Self-pay

## 2020-07-09 DIAGNOSIS — E7849 Other hyperlipidemia: Secondary | ICD-10-CM

## 2020-07-09 MED ORDER — ATORVASTATIN CALCIUM 10 MG PO TABS: 10.0000 mg | ORAL_TABLET | Freq: Every day | ORAL | 2 refills | Status: DC

## 2020-07-11 ENCOUNTER — Telehealth: Payer: Self-pay | Admitting: Family Medicine

## 2020-07-11 NOTE — Telephone Encounter (Signed)
Left message for patient to call back and schedule Medicare Annual Wellness Visit (AWV) either virtually or in office.   Last AWV 12/20/18  please schedule at anytime with [facility] Nurse Health Advisor 1 or 2   This should be a 45 minute visit.

## 2020-07-25 ENCOUNTER — Telehealth: Payer: Self-pay | Admitting: Family Medicine

## 2020-07-25 NOTE — Progress Notes (Signed)
  Chronic Care Management   Outreach Note  07/25/2020 Name: Kristina [name] MRN: [id number] DOB: March 29, 1951  Referred by: Martinique, Betty G, MD Reason for referral : No chief complaint on file.   An unsuccessful telephone outreach was attempted today. The patient was referred to the pharmacist for assistance with care management and care coordination.   Follow Up Plan:   Carley Perdue [employer] Scheduler

## 2020-08-04 ENCOUNTER — Telehealth: Payer: Self-pay | Admitting: Family Medicine

## 2020-08-04 NOTE — Progress Notes (Signed)
  Chronic Care Management   Outreach Note  08/04/2020 Name: Kristina [name] MRN: [id number] DOB: 06/05/1951  Referred by: Martinique, Betty G, MD Reason for referral : No chief complaint on file.   A second unsuccessful telephone outreach was attempted today. The patient was referred to pharmacist for assistance with care management and care coordination.  Follow Up Plan:   Kristina [name] [employer] Scheduler

## 2020-08-09 ENCOUNTER — Other Ambulatory Visit: Payer: Self-pay | Admitting: Family Medicine

## 2020-08-09 DIAGNOSIS — G4700 Insomnia, unspecified: Secondary | ICD-10-CM

## 2020-08-14 NOTE — Telephone Encounter (Signed)
Last office visit-03/19/20 Last refill- 03/19/20--45 tabs with 3 refills

## 2020-08-19 ENCOUNTER — Other Ambulatory Visit: Payer: Self-pay

## 2020-08-19 ENCOUNTER — Encounter: Payer: Self-pay | Admitting: Family Medicine

## 2020-08-19 ENCOUNTER — Ambulatory Visit (INDEPENDENT_AMBULATORY_CARE_PROVIDER_SITE_OTHER): Payer: PPO | Admitting: Family Medicine

## 2020-08-19 VITALS — BP 120/74 | HR 84 | Temp 98.2°F | Resp 16 | Ht 62.0 in | Wt 175.0 lb

## 2020-08-19 DIAGNOSIS — M79671 Pain in right foot: Secondary | ICD-10-CM

## 2020-08-19 DIAGNOSIS — I10 Essential (primary) hypertension: Secondary | ICD-10-CM | POA: Diagnosis not present

## 2020-08-19 DIAGNOSIS — M159 Polyosteoarthritis, unspecified: Secondary | ICD-10-CM | POA: Diagnosis not present

## 2020-08-19 DIAGNOSIS — R0989 Other specified symptoms and signs involving the circulatory and respiratory systems: Secondary | ICD-10-CM | POA: Diagnosis not present

## 2020-08-19 DIAGNOSIS — F418 Other specified anxiety disorders: Secondary | ICD-10-CM | POA: Diagnosis not present

## 2020-08-19 DIAGNOSIS — R202 Paresthesia of skin: Secondary | ICD-10-CM | POA: Diagnosis not present

## 2020-08-19 DIAGNOSIS — E538 Deficiency of other specified B group vitamins: Secondary | ICD-10-CM

## 2020-08-19 DIAGNOSIS — R200 Anesthesia of skin: Secondary | ICD-10-CM

## 2020-08-19 LAB — BASIC METABOLIC PANEL
BUN: 25 mg/dL — ABNORMAL HIGH (ref 6–23)
CO2: 22 mEq/L (ref 19–32)
Calcium: 9 mg/dL (ref 8.4–10.5)
Chloride: 105 mEq/L (ref 96–112)
Creatinine, Ser: 0.88 mg/dL (ref 0.40–1.20)
GFR: 66.68 mL/min (ref >60.00)
Glucose, Bld: 108 mg/dL — ABNORMAL HIGH (ref 70–99)
Potassium: 4.6 mEq/L (ref 3.5–5.1)
Sodium: 134 mEq/L — ABNORMAL LOW (ref 135–145)

## 2020-08-19 LAB — VITAMIN B12: Vitamin B-12: 815 pg/mL (ref 211–911)

## 2020-08-19 NOTE — Assessment & Plan Note (Signed)
Worse around this time of the year, most likely due to recent rainy days. We discussed diagnosis and prognosis. Tylenol 500 mg 3-4 times per day as needed is a good option for pain management.

## 2020-08-19 NOTE — Assessment & Plan Note (Signed)
Continue weekly B12 1000 mcg daily. Further recommendation will be given according to B12 result.

## 2020-08-19 NOTE — Assessment & Plan Note (Signed)
BP adequately controlled. Continue propranolol 40 mg twice daily, which also helps with tremor. Low-salt diet. Eye exam is current.

## 2020-08-19 NOTE — Patient Instructions (Addendum)
A few things to remember from today's visit:   Essential hypertension - Plan: Basic metabolic panel  Other specified anxiety disorders  Abnormal peripheral pulse - Plan: VAS [place] ABI WITH/WO TBI  Numbness and tingling of foot - Plan: Vitamin B12, VAS [place] ABI WITH/WO TBI  B12 deficiency - Plan: Vitamin B12  If you need refills please call your pharmacy. Do not use My Chart to request refills or for acute issues that need immediate attention.    Morton Neuralgia  Morton neuralgia is foot pain that affects the ball of the foot and the area near the toes. Morton neuralgia occurs when part of a nerve in the foot (digital nerve) is under too much pressure (compressed). When this happens over a long period of time, the nerve can thicken (neuroma) and cause pain. Pain usually occurs between the third and fourth toes.  Morton neuralgia can come and go but may get worse over time. What are the causes? This condition is caused by doing the same things over and over with your foot, such as:  Activities such as running or jumping.  Wearing shoes that are too tight. What increases the risk? You may be at higher risk for Morton neuralgia if you:  Are female.  Wear high heels.  Wear shoes that are narrow or tight.  Do activities that repeatedly stretch your toes, such as: ? Running. ? [place]. ? Long-distance walking. What are the signs or symptoms? The first symptom of Morton neuralgia is pain that spreads from the ball of the foot to the toes. It may feel like you are walking on a marble. Pain usually gets worse with walking and goes away at night. Other symptoms may include numbness and cramping of your toes. Both feet are equally affected, but rarely at the same time. How is this diagnosed? This condition is diagnosed based on your symptoms, your medical history, and a physical exam. Your health care provider may:  Squeeze your foot just behind your toe.  Ask you to move your toes to  check for pain.  Ask about your physical activity level. You also may have imaging tests, such as an X-ray, ultrasound, or MRI. How is this treated? Treatment depends on how severe your condition is and what causes it. Treatment may involve:  Wearing different shoes that are not too tight, are low-heeled, and provide good support. For some people, this is the only treatment needed.  Wearing an over-the-counter or custom supportive pad (orthotic) under the front of your foot.  Getting injections of numbing medicine and anti-inflammatory medicine (steroid) in the nerve.  Having surgery to remove part of the thickened nerve. Follow these instructions at home: Managing pain, stiffness, and swelling  Massage your foot as needed.  Wear orthotics as told by your health care provider.  If directed, put ice on your foot: ? Put ice in a plastic bag. ? Place a towel between your skin and the bag. ? Leave the ice on for 20 minutes, 2-3 times a day.  Avoid activities that cause pain or make pain worse. If you play sports, ask your health care provider when it is safe for you to return to sports.  Raise (elevate) your foot above the level of your heart while lying down and, when possible, while sitting.   General instructions  Take over-the-counter and prescription medicines only as told by your health care provider.  Do not drive or use heavy machinery while taking prescription pain medicine.  Wear  shoes that: ? Have soft soles. ? Have a wide toe area. ? Provide arch support. ? Do not pinch or squeeze your feet. ? Have room for your orthotics, if applicable.  Keep all follow-up visits as told by your health care provider. This is important. Contact a health care provider if:  Your symptoms get worse or do not get better with treatment and home care. Summary  Morton neuralgia is foot pain that affects the ball of the foot and the area near the toes. Pain usually occurs between the  third and fourth toes, gets worse with walking, and goes away at night.  Morton neuralgia occurs when part of a nerve in the foot (digital nerve) is under too much pressure. When this happens over a long period of time, the nerve can thicken (neuroma) and cause pain.  This condition is caused by doing the same things over and over with your foot, such as running or jumping, wearing shoes that are too tight, or wearing high heels.  Treatment may involve wearing low-heeled shoes that are not too tight, wearing a supportive pad (orthotic) under the front of your foot, getting injections in the nerve, or having surgery to remove part of the thickened nerve. This information is not intended to replace advice given to you by your health care provider. Make sure you discuss any questions you have with your health care provider. Document Revised: 08/09/2017 Document Reviewed: 08/09/2017 Elsevier Patient Education  2021 [place].  Please be sure medication list is accurate. If a new problem present, please set up appointment sooner than planned today.

## 2020-08-19 NOTE — Progress Notes (Signed)
HPI: [name] is a 70 y.o. female, who is here today for 5 months follow up.   She was last seen on 03/19/20.  Hypertension:  Currently on Propranolol 40 mg bid, which has also helped with essential tremor.   She has not noted unusual headache, visual changes, exertional chest pain, dyspnea,  focal weakness, or edema.  Lab Results  Component Value Date   CREATININE 0.94 11/21/2019   BUN 26 11/21/2019   NA 135 11/21/2019   K 4.6 11/21/2019   CL 100 11/21/2019   CO2 23 11/21/2019   Anxiety and depression: She is on Alprazolam 0.25 mg bid prn. She has been on Alprazolam for 20+ years.  Paroxetine 10 mg started for hot flashes in 05/2020, medication has also help with anxiety. She has not had hot flashes.  GAD 7 : Generalized Anxiety Score 08/19/2020 12/22/2019  Nervous, Anxious, on Edge 1 1  Control/stop worrying 1 1  Worry too much - different things 1 1  Trouble relaxing 1 1  Restless 0 0  Easily annoyed or irritable 1 1  Afraid - awful might happen 0 0  Total GAD 7 Score 5 5  Anxiety Difficulty Somewhat difficult Somewhat difficult   Depression screen [facility] 2/9 08/19/2020 12/22/2019 03/26/2019 12/20/2018 12/16/2017  Decreased Interest 0 0 1 0 0  Down, Depressed, Hopeless 0 0 2 0 0  PHQ - 2 Score 0 0 3 0 0  Altered sleeping 0 1 0 - -  Tired, decreased energy 1 1 3  - -  Change in appetite 0 0 2 - -  Feeling bad or failure about yourself  0 0 2 - -  Trouble concentrating 0 0 1 - -  Moving slowly or fidgety/restless 0 0 1 - -  Suicidal thoughts 0 0 0 - -  PHQ-9 Score 1 2 12  - -  Difficult doing work/chores Not difficult at all Not difficult at all Somewhat difficult - -   Right hip and back pain have been worse for the past few days. Right lower back pain occasionally radiated to RLE. Back pain is worse in the morning and alleviated by movement. Flexeril 5 mg daily helps. IP joint pain. She was on Cymbalta before, did not feel like helping with  arthralgias. She has seen rheumatologist.  Tinging sensation around left ankle. This is a new problem. No associated weakness,edema,or erythema.  B12 deficiency: She is on b12 1000 mcg weekly.  Lab Results  Component Value Date   VITAMINB12 1,827 (H) 11/21/2019   Right 2nd toe pain for about 2 years. Walking with no shoe aggravate pain. Shooting like pain.  She has also noted that length of toes has changed after great toe surgery.  Review of Systems  Constitutional: Positive for fatigue. Negative for activity change, appetite change and fever.  HENT: Negative for mouth sores, nosebleeds and sore throat.   Respiratory: Negative for cough and wheezing.   Gastrointestinal: Negative for abdominal pain, nausea and vomiting.       Negative for changes in bowel habits.  Genitourinary: Negative for decreased urine volume and hematuria.  Musculoskeletal: Positive for arthralgias and back pain. Negative for gait problem.  Neurological: Negative for syncope, facial asymmetry and weakness.  Psychiatric/Behavioral: Negative for confusion. The patient is nervous/anxious.   Rest of ROS, see pertinent positives sand negatives in HPI  Current Outpatient Medications on File Prior to Visit  Medication Sig Dispense Refill  . Acetaminophen (TYLENOL PO) Take 400 mg by  mouth 2 (two) times daily.    Marland Kitchen albuterol (PROVENTIL HFA;VENTOLIN HFA) 108 (90 Base) MCG/ACT inhaler Inhale 2 puffs into the lungs every 6 (six) hours as needed for wheezing or shortness of breath. 1 Inhaler 2  . ALPRAZolam (XANAX) 0.25 MG tablet Take 1 tablet by mouth twice daily as needed for anxiety 45 tablet 3  . atorvastatin (LIPITOR) 10 MG tablet Take 1 tablet (10 mg total) by mouth daily. 90 tablet 2  . Azelaic Acid (FINACEA) 15 % cream Apply 1 application topically 2 (two) times daily. After skin is thoroughly washed and patted dry, gently but thoroughly massage a thin film of azelaic acid cream into the affected area twice  daily, in the morning and evening. 150 g 1  . Cholecalciferol 100 MCG (4000 UT) CAPS Take 1 capsule (4,000 Units total) by mouth daily. 30 capsule 0  . cyclobenzaprine (FLEXERIL) 5 MG tablet Take 1 tablet (5 mg total) by mouth at bedtime. 90 tablet 1  . estradiol (VIVELLE-DOT) 0.0375 MG/24HR APPLY 1 PATCH TOPICALLY TWICE A WEEK  8  . Magnesium 250 MG TABS Take by mouth daily.    . meclizine (ANTIVERT) 12.5 MG tablet Take 1 tablet (12.5 mg total) by mouth 3 (three) times daily as needed for dizziness. 60 tablet 1  . Multiple Vitamin (MULTIVITAMIN) tablet Take 1 tablet by mouth daily.    . pantoprazole (PROTONIX) 40 MG tablet Take 1 tablet (40 mg total) by mouth daily. 90 tablet 2  . PARoxetine (PAXIL) 10 MG tablet Take 1 tablet (10 mg total) by mouth daily. 90 tablet 1  . propranolol (INDERAL) 40 MG tablet Take 1 tablet by mouth twice a day 180 tablet 2  . RESTASIS 0.05 % ophthalmic emulsion     . SHINGRIX injection     . SYNTHROID 25 MCG tablet Take 1 tablet (25 mcg total) by mouth daily before breakfast. 90 tablet 2  . VOLTAREN 1 % GEL SMARTSIG:1 Sparingly Topical PRN     No current facility-administered medications on file prior to visit.    Past Medical History:  Diagnosis Date  . Anxiety   . Arthritis   . Back pain   . Cancer (HCC)    basal and squamous cell skin  . Chicken pox   . Chronic back pain   . COPD (chronic obstructive pulmonary disease) (HCC)   . Depression   . Gallbladder problem   . GERD (gastroesophageal reflux disease)   . Hypertension   . Hypothyroidism   . Joint pain   . Lactose intolerance   . Migraines   . Osteoarthritis   . SOB (shortness of breath)   . Thyroid disease   . Tremor   . Vitamin D deficiency    Allergies  Allergen Reactions  . Hydrocodone Nausea Only    Per patient   . Meloxicam Other (See Comments)    "skin infection on leg"  . Sulfa Antibiotics Rash    Rash all over body/fever    Social History   Socioeconomic History  .  Marital status: Single    Spouse name: Not on file  . Number of children: 0  . Years of education: 68  . Highest education level: Not on file  Occupational History  . Occupation: retired    Comment: administration  Tobacco Use  . Smoking status: Former Smoker    Packs/day: 1.50    Years: 25.00    Pack years: 37.50    Quit date: 03/03/1990  Years since quitting: 30.4  . Smokeless tobacco: Never Used  Vaping Use  . Vaping Use: Never used  Substance and Sexual Activity  . Alcohol use: Yes    Comment: glass of wine once a month  . Drug use: No  . Sexual activity: Never  Other Topics Concern  . Not on file  Social History Narrative   Lives alone   Caffeine- tea, 1 cup   Social Determinants of Health   Financial Resource Strain: Not on file  Food Insecurity: Not on file  Transportation Needs: Not on file  Physical Activity: Not on file  Stress: Not on file  Social Connections: Not on file    Vitals:   08/19/20 0949  BP: 120/74  Pulse: 84  Resp: 16  Temp: 98.2 F (36.8 C)  SpO2: 96%   Body mass index is 32.01 kg/m.  Physical Exam Vitals and nursing note reviewed.  Constitutional:      General: She is not in acute distress.    Appearance: She is well-developed.  HENT:     Head: Normocephalic and atraumatic.     Mouth/Throat:     Mouth: Oropharynx is clear and moist and mucous membranes are normal. Mucous membranes are moist.     Pharynx: Oropharynx is clear.  Eyes:     Conjunctiva/sclera: Conjunctivae normal.     Pupils: Pupils are equal, round, and reactive to light.  Cardiovascular:     Rate and Rhythm: Normal rate and regular rhythm.     Heart sounds: No murmur heard.     Comments: Difficult to find DP pulses. Pulmonary:     Effort: Pulmonary effort is normal. No respiratory distress.     Breath sounds: Normal breath sounds.  Abdominal:     Palpations: Abdomen is soft. There is no hepatomegaly or mass.     Tenderness: There is no abdominal  tenderness.  Musculoskeletal:        General: No edema.       Feet:  Lymphadenopathy:     Cervical: No cervical adenopathy.  Skin:    General: Skin is warm.     Findings: No erythema or rash.  Neurological:     Mental Status: She is alert and oriented to person, place, and time.     Cranial Nerves: No cranial nerve deficit.     Gait: Gait normal.     Deep Tendon Reflexes: Strength normal.  Psychiatric:        Mood and Affect: Mood and affect normal.     Comments: Well groomed, good eye contact.   ASSESSMENT AND PLAN:  Ms. Kristina [name] was seen today for 5 months follow-up.  Orders Placed This Encounter  Procedures  . Basic metabolic panel  . Vitamin B12  . VAS [place] ABI WITH/WO TBI   Lab Results  Component Value Date   CREATININE 0.88 08/19/2020   BUN 25 (H) 08/19/2020   NA 134 (L) 08/19/2020   K 4.6 08/19/2020   CL 105 08/19/2020   CO2 22 08/19/2020   Lab Results  Component Value Date   VITAMINB12 815 08/19/2020   Abnormal peripheral pulse Appropriate foot care. ABI will be arranged.  Numbness and tingling of foot Possible etiologies discussed. For now recommend continuing monitoring for changes. If persistent we may need to consider lumbar imaging and/or EMG.  Essential hypertension BP adequately controlled. Continue propranolol 40 mg twice daily, which also helps with tremor. Low-salt diet. Eye exam is current.  Anxiety disorder Problem has  improved. Continue paroxetine 10 mg daily and alprazolam 0.25 mg twice daily as needed.  B12 deficiency Continue weekly B12 1000 mcg daily. Further recommendation will be given according to B12 result.  Generalized osteoarthritis of multiple sites Worse around this time of the year, most likely due to recent rainy days. We discussed diagnosis and prognosis. Tylenol 500 mg 3-4 times per day as needed is a good option for pain management.  Right foot pain In between 2nd-3rd MTP joint, ? Morton neuroma among some  to consider.  Recommend avoiding identified trigger factors. Inserts. Follow with podiatrist if needed.  Spent 40 minutes.  During this time history was obtained and documented, examination was performed, prior labs  reviewed, and assessment/plan discussed.  Return in about 6 months (around 02/16/2021) for ANXIETY,HLD,THYROID.   Nickalas Mccarrick G. [place], MD  [facility]. [place] office.    A few things to remember from today's visit:   Essential hypertension - Plan: Basic metabolic panel  Other specified anxiety disorders  Abnormal peripheral pulse - Plan: VAS [place] ABI WITH/WO TBI  Numbness and tingling of foot - Plan: Vitamin B12, VAS [place] ABI WITH/WO TBI  B12 deficiency - Plan: Vitamin B12  If you need refills please call your pharmacy. Do not use My Chart to request refills or for acute issues that need immediate attention.    Morton Neuralgia  Morton neuralgia is foot pain that affects the ball of the foot and the area near the toes. Morton neuralgia occurs when part of a nerve in the foot (digital nerve) is under too much pressure (compressed). When this happens over a long period of time, the nerve can thicken (neuroma) and cause pain. Pain usually occurs between the third and fourth toes.  Morton neuralgia can come and go but may get worse over time. What are the causes? This condition is caused by doing the same things over and over with your foot, such as:  Activities such as running or jumping.  Wearing shoes that are too tight. What increases the risk? You may be at higher risk for Morton neuralgia if you:  Are female.  Wear high heels.  Wear shoes that are narrow or tight.  Do activities that repeatedly stretch your toes, such as: ? Running. ? [place]. ? Long-distance walking. What are the signs or symptoms? The first symptom of Morton neuralgia is pain that spreads from the ball of the foot to the toes. It may feel like you are walking on a marble.  Pain usually gets worse with walking and goes away at night. Other symptoms may include numbness and cramping of your toes. Both feet are equally affected, but rarely at the same time. How is this diagnosed? This condition is diagnosed based on your symptoms, your medical history, and a physical exam. Your health care provider may:  Squeeze your foot just behind your toe.  Ask you to move your toes to check for pain.  Ask about your physical activity level. You also may have imaging tests, such as an X-ray, ultrasound, or MRI. How is this treated? Treatment depends on how severe your condition is and what causes it. Treatment may involve:  Wearing different shoes that are not too tight, are low-heeled, and provide good support. For some people, this is the only treatment needed.  Wearing an over-the-counter or custom supportive pad (orthotic) under the front of your foot.  Getting injections of numbing medicine and anti-inflammatory medicine (steroid) in the nerve.  Having surgery to  remove part of the thickened nerve. Follow these instructions at home: Managing pain, stiffness, and swelling  Massage your foot as needed.  Wear orthotics as told by your health care provider.  If directed, put ice on your foot: ? Put ice in a plastic bag. ? Place a towel between your skin and the bag. ? Leave the ice on for 20 minutes, 2-3 times a day.  Avoid activities that cause pain or make pain worse. If you play sports, ask your health care provider when it is safe for you to return to sports.  Raise (elevate) your foot above the level of your heart while lying down and, when possible, while sitting.   General instructions  Take over-the-counter and prescription medicines only as told by your health care provider.  Do not drive or use heavy machinery while taking prescription pain medicine.  Wear shoes that: ? Have soft soles. ? Have a wide toe area. ? Provide arch support. ? Do not  pinch or squeeze your feet. ? Have room for your orthotics, if applicable.  Keep all follow-up visits as told by your health care provider. This is important. Contact a health care provider if:  Your symptoms get worse or do not get better with treatment and home care. Summary  Morton neuralgia is foot pain that affects the ball of the foot and the area near the toes. Pain usually occurs between the third and fourth toes, gets worse with walking, and goes away at night.  Morton neuralgia occurs when part of a nerve in the foot (digital nerve) is under too much pressure. When this happens over a long period of time, the nerve can thicken (neuroma) and cause pain.  This condition is caused by doing the same things over and over with your foot, such as running or jumping, wearing shoes that are too tight, or wearing high heels.  Treatment may involve wearing low-heeled shoes that are not too tight, wearing a supportive pad (orthotic) under the front of your foot, getting injections in the nerve, or having surgery to remove part of the thickened nerve. This information is not intended to replace advice given to you by your health care provider. Make sure you discuss any questions you have with your health care provider. Document Revised: 08/09/2017 Document Reviewed: 08/09/2017 Elsevier Patient Education  2021 [place].  Please be sure medication list is accurate. If a new problem present, please set up appointment sooner than planned today.

## 2020-08-19 NOTE — Assessment & Plan Note (Signed)
Problem has improved. Continue paroxetine 10 mg daily and alprazolam 0.25 mg twice daily as needed.

## 2020-09-01 ENCOUNTER — Other Ambulatory Visit: Payer: Self-pay

## 2020-09-01 ENCOUNTER — Ambulatory Visit (INDEPENDENT_AMBULATORY_CARE_PROVIDER_SITE_OTHER): Payer: PPO

## 2020-09-01 DIAGNOSIS — R0989 Other specified symptoms and signs involving the circulatory and respiratory systems: Secondary | ICD-10-CM | POA: Diagnosis not present

## 2020-09-01 DIAGNOSIS — R202 Paresthesia of skin: Secondary | ICD-10-CM | POA: Diagnosis not present

## 2020-09-01 DIAGNOSIS — R200 Anesthesia of skin: Secondary | ICD-10-CM | POA: Diagnosis not present

## 2020-09-09 ENCOUNTER — Encounter: Payer: Self-pay | Admitting: Dermatology

## 2020-09-09 ENCOUNTER — Other Ambulatory Visit: Payer: Self-pay

## 2020-09-09 ENCOUNTER — Ambulatory Visit: Payer: PPO | Admitting: Dermatology

## 2020-09-09 DIAGNOSIS — D692 Other nonthrombocytopenic purpura: Secondary | ICD-10-CM | POA: Diagnosis not present

## 2020-09-09 DIAGNOSIS — L821 Other seborrheic keratosis: Secondary | ICD-10-CM | POA: Diagnosis not present

## 2020-09-09 DIAGNOSIS — Z1283 Encounter for screening for malignant neoplasm of skin: Secondary | ICD-10-CM

## 2020-09-09 DIAGNOSIS — L719 Rosacea, unspecified: Secondary | ICD-10-CM

## 2020-09-09 DIAGNOSIS — D229 Melanocytic nevi, unspecified: Secondary | ICD-10-CM

## 2020-09-09 DIAGNOSIS — Z85828 Personal history of other malignant neoplasm of skin: Secondary | ICD-10-CM | POA: Diagnosis not present

## 2020-09-09 DIAGNOSIS — D1800 Hemangioma unspecified site: Secondary | ICD-10-CM

## 2020-09-09 DIAGNOSIS — L570 Actinic keratosis: Secondary | ICD-10-CM | POA: Diagnosis not present

## 2020-09-09 DIAGNOSIS — L578 Other skin changes due to chronic exposure to nonionizing radiation: Secondary | ICD-10-CM

## 2020-09-09 DIAGNOSIS — L853 Xerosis cutis: Secondary | ICD-10-CM | POA: Diagnosis not present

## 2020-09-09 DIAGNOSIS — L820 Inflamed seborrheic keratosis: Secondary | ICD-10-CM | POA: Diagnosis not present

## 2020-09-09 DIAGNOSIS — L814 Other melanin hyperpigmentation: Secondary | ICD-10-CM | POA: Diagnosis not present

## 2020-09-09 MED ORDER — AZELAIC ACID 15 % EX GEL: 1.0000 "application " | Freq: Two times a day (BID) | CUTANEOUS | 3 refills | Status: DC

## 2020-09-09 NOTE — Progress Notes (Signed)
Follow-Up Visit   Subjective  Kristina [name] is a 70 y.o. female who presents for the following: Total body skine exam (1 yr f/u, hx of SCC, Aks) and check spots (Face, ~47m).  Spot on jaw and abdomen get irritated.  She has rosacea and needs rf for Azelaic acid gel   The following portions of the chart were reviewed this encounter and updated as appropriate:       Review of Systems:  No other skin or systemic complaints except as noted in HPI or Assessment and Plan.  Objective  Well appearing patient in no apparent distress; mood and affect are within normal limits.  A full examination was performed including scalp, head, eyes, ears, nose, lips, neck, chest, axillae, abdomen, back, buttocks, bilateral upper extremities, bilateral lower extremities, hands, feet, fingers, toes, fingernails, and toenails. All findings within normal limits unless otherwise noted below.  Objective  Nose: Well healed scar with no evidence of recurrence   Objective  face: Erythema with telangiectasias cheeks, nose  Objective  L jaw x 1, L upper abdomen x 1 (2): Erythematous keratotic or waxy stuck-on papule   Objective  R paranasal x 1 L nasal dorsum x 1, upper lip x 3, R cheek x 1, R medial clavicle x 1 (8): Pink scaly macules    Assessment & Plan    Actinic Damage - Severe, chronic, secondary to cumulative UV radiation exposure over time - diffuse scaly erythematous macules and papules with underlying dyspigmentation - Discussed Prescription "Field Treatment" for Severe, Chronic Confluent Actinic Changes with Pre-Cancerous Actinic Keratoses Field treatment involves treatment of an entire area of skin that has confluent Actinic Changes (Sun/ Ultraviolet light damage) and PreCancerous Actinic Keratoses by method of PhotoDynamic Therapy (PDT) and/or prescription Topical Chemotherapy agents such as 5-fluorouracil, 5-fluorouracil/calcipotriene, and/or imiquimod.  The purpose is to decrease the number  of clinically evident and subclinical PreCancerous lesions to prevent progression to development of skin cancer by chemically destroying early precancer changes that may or may not be visible.  It has been shown to reduce the risk of developing skin cancer in the treated area. As a result of treatment, redness, scaling, crusting, and open sores may occur during treatment course. One or more than one of these methods may be used and may have to be used several times to control, suppress and eliminate the PreCancerous changes. Discussed treatment course, expected reaction, and possible side effects. - Recommend daily broad spectrum sunscreen SPF 30+ to sun-exposed areas, reapply every 2 hours as needed.  - Call for new or changing lesions. - pt will start Carac cream qhs to nose, malar cheek, upper lip x 4 weeks  Skin cancer screening performed today.  Seborrheic Keratoses - Stuck-on, waxy, tan-brown papules and plaques  - Discussed benign etiology and prognosis. - Observe - Call for any changes  Lentigines - Scattered tan macules - Discussed due to sun exposure - Benign, observe - Recommend daily broad spectrum sunscreen SPF 30+ to sun-exposed areas, reapply every 2 hours as needed. - Call for any changes  Hemangiomas - Red papules - Discussed benign nature - Observe - Call for any changes  Melanocytic Nevi - Tan-brown and/or pink-flesh-colored symmetric macules and papules - Benign appearing on exam today - Observation - Call clinic for new or changing moles - Recommend daily use of broad spectrum spf 30+ sunscreen to sun-exposed areas.   Xerosis - diffuse xerotic patches - recommend gentle, hydrating skin care - gentle skin care handout given -  Cont Dove soap, recommend Cerave SA cream, or Amlactin Rapid Relief  Purpura - Chronic; persistent and recurrent.  Treatable, but not curable. 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 Observe for recurrence. Call clinic for new or changing lesions.  Recommend regular skin exams, daily broad-spectrum spf 30+ sunscreen use, and photoprotection.     Rosacea face  Chronic, controlled  Cont Azelaic Acid gel qd/bid 50g x 3, 3rf  Rosacea is a chronic progressive skin condition usually affecting the face of adults, causing redness and/or acne bumps. It is treatable but not curable. It sometimes affects the eyes (ocular rosacea) as well. It may respond to topical and/or systemic medication and can flare with stress, sun exposure, alcohol, exercise and some foods.  Daily application of broad spectrum spf 30+ sunscreen to face is recommended to reduce flares.   Reordered Medications Azelaic Acid (FINACEA) 15 % cream  Inflamed seborrheic keratosis (2) L jaw x 1, L upper abdomen x 1  Destruction of lesion - L jaw x 1, L upper abdomen x 1  Destruction method: cryotherapy   Informed consent: discussed and consent obtained   Lesion destroyed using liquid nitrogen: Yes   Region frozen until ice ball extended beyond lesion: Yes   Outcome: patient tolerated procedure well with no complications   Post-procedure details: wound care instructions given    AK (actinic keratosis) (8) R paranasal x 1 L nasal dorsum x 1, upper lip x 3, R cheek x 1, R medial clavicle x 1   Start Carac cream qhs once healed from cryotherapy  x ~4 weeks/or when areas scab/crust to nose and upper lip, pt has prescription at home  Destruction of lesion - R paranasal x 1 L nasal dorsum x 1, upper lip x 3, R cheek x 1, R medial clavicle x 1  Destruction method: cryotherapy   Informed consent: discussed and consent obtained   Lesion destroyed  using liquid nitrogen: Yes   Region frozen until ice ball extended beyond lesion: Yes   Outcome: patient tolerated procedure well with no complications   Post-procedure details: wound care instructions given   Additional details:  Prior to procedure, discussed risks of blister formation, small wound, skin dyspigmentation, or rare scar following cryotherapy.     Return in about 6 months (around 03/09/2021) for AK f/u face, chest.   I, Sonya Hupman, RMA, am acting as scribe for [name], MD . Documentation: I have reviewed the above documentation for accuracy and completeness, and I agree with the above.  [name] MD

## 2020-09-09 NOTE — Patient Instructions (Addendum)
Dry Skin Care  What causes dry skin?  Dry skin is common and results from inadequate moisture in the outer skin layers. Dry skin usually results from the excessive loss of moisture from the skin surface. This occurs due to two major factors: 1. Normally the skin's oil glands deposit a layer of oil on the skin's surface. This layer of oil prevents the loss of moisture from the skin. Exposure to soaps, cleaners, solvents, and disinfectants removes this oily film, allowing water to escape. 2. Water loss from the skin increases when the humidity is low. During winter months we spend a lot of time indoors where the air is heated. Heated air has very low humidity. This also contributes to dry skin.  A tendency for dry skin may accompany such disorders as eczema. Also, as people age, the number of functioning oil glands decreases, and the tendency toward dry skin can be a sensation of skin tightness when emerging from the shower.  How do I manage dry skin?  1. Humidify your environment. This can be accomplished by using a humidifier in your bedroom at night during winter months. 2. Bathing can actually put moisture back into your skin if done right. Take the following steps while bathing to sooth dry skin:  Avoid hot water, which only dries the skin and makes itching worse. Use warm water.  Avoid washcloths or extensive rubbing or scrubbing.  Use mild soaps like unscented Dove, Oil of Olay, Cetaphil, Basis, or CeraVe.  If you take baths rather than showers, rinse off soap residue with clean water before getting out of tub.  Once out of the shower/tub, pat dry gently with a soft towel. Leave your skin damp.  While still damp, apply any medicated ointment/cream you were prescribed to the affected areas. After you apply your medicated ointment/cream, then apply your moisturizer to your whole body.This is the most important step in dry skin care. If this is omitted, your skin will continue to be  dry.  The choice of moisturizer is also very important. In general, lotion will not provider enough moisture to severely dry skin because it is water based. You should use an ointment or cream. Moisturizers should also be unscented. Good choices include Vaseline (plain petrolatum), Aquaphor, Cetaphil, CeraVe, Vanicream, DML Forte, Aveeno moisture, or Eucerin Cream.  Bath oils can be helpful, but do not replace the application of moisturizer after the bath. In addition, they make the tub slippery causing an increased risk for falls. Therefore, we do not recommend their use.    Carac cream to face  Start Carac cream nightly for ~4 weeks/or when areas scab/crust to nose and upper lip,

## 2020-09-19 ENCOUNTER — Other Ambulatory Visit: Payer: Self-pay

## 2020-09-19 DIAGNOSIS — K219 Gastro-esophageal reflux disease without esophagitis: Secondary | ICD-10-CM

## 2020-09-19 MED ORDER — PANTOPRAZOLE SODIUM 40 MG PO TBEC: 40.0000 mg | DELAYED_RELEASE_TABLET | Freq: Every day | ORAL | 2 refills | Status: DC

## 2020-09-22 DIAGNOSIS — M25551 Pain in right hip: Secondary | ICD-10-CM | POA: Diagnosis not present

## 2020-09-22 DIAGNOSIS — M5416 Radiculopathy, lumbar region: Secondary | ICD-10-CM | POA: Diagnosis not present

## 2020-09-25 ENCOUNTER — Telehealth: Payer: Self-pay | Admitting: Family Medicine

## 2020-09-25 NOTE — Progress Notes (Signed)
°  Chronic Care Management   Note  09/25/2020 Name: Kristina [name] MRN: [id number] DOB: August 04, 1951  Kristina [name] is a 70 y.o. year old female who is a primary care patient of Martinique, Malka So, MD. I reached out to Bailey Mech by phone today in response to a referral sent by Kristina [name] PCP, Martinique, Betty G, MD.   Kristina [name] was given information about Chronic Care Management services today including:  1. CCM service includes personalized support from designated clinical staff supervised by her physician, including individualized plan of care and coordination with other care providers 2. 24/7 contact phone numbers for assistance for urgent and routine care needs. 3. Service will only be billed when office clinical staff spend 20 minutes or more in a month to coordinate care. 4. Only one practitioner may furnish and bill the service in a calendar month. 5. The patient may stop CCM services at any time (effective at the end of the month) by phone call to the office staff.   Patient agreed to services and verbal consent obtained.   Follow up plan:   Carley Perdue [employer] Scheduler

## 2020-10-01 ENCOUNTER — Other Ambulatory Visit: Payer: Self-pay

## 2020-10-01 ENCOUNTER — Ambulatory Visit: Payer: PPO

## 2020-10-01 ENCOUNTER — Encounter: Payer: Self-pay | Admitting: Podiatry

## 2020-10-01 ENCOUNTER — Ambulatory Visit: Payer: PPO | Admitting: Podiatry

## 2020-10-01 DIAGNOSIS — D3613 Benign neoplasm of peripheral nerves and autonomic nervous system of lower limb, including hip: Secondary | ICD-10-CM

## 2020-10-01 DIAGNOSIS — M778 Other enthesopathies, not elsewhere classified: Secondary | ICD-10-CM | POA: Diagnosis not present

## 2020-10-01 DIAGNOSIS — N952 Postmenopausal atrophic vaginitis: Secondary | ICD-10-CM | POA: Insufficient documentation

## 2020-10-01 DIAGNOSIS — N393 Stress incontinence (female) (male): Secondary | ICD-10-CM | POA: Insufficient documentation

## 2020-10-01 DIAGNOSIS — M130 Polyarthritis, unspecified: Secondary | ICD-10-CM | POA: Diagnosis not present

## 2020-10-01 DIAGNOSIS — S99921A Unspecified injury of right foot, initial encounter: Secondary | ICD-10-CM

## 2020-10-01 DIAGNOSIS — Z780 Asymptomatic menopausal state: Secondary | ICD-10-CM | POA: Insufficient documentation

## 2020-10-01 DIAGNOSIS — M5416 Radiculopathy, lumbar region: Secondary | ICD-10-CM | POA: Diagnosis not present

## 2020-10-01 DIAGNOSIS — R87810 Cervical high risk human papillomavirus (HPV) DNA test positive: Secondary | ICD-10-CM | POA: Insufficient documentation

## 2020-10-01 DIAGNOSIS — M810 Age-related osteoporosis without current pathological fracture: Secondary | ICD-10-CM | POA: Insufficient documentation

## 2020-10-01 DIAGNOSIS — Z90710 Acquired absence of both cervix and uterus: Secondary | ICD-10-CM | POA: Insufficient documentation

## 2020-10-01 NOTE — Progress Notes (Signed)
She presents today stating still have the pain beneath the second toe as she refers to the second metatarsophalangeal joint of the right foot.  Objective: Vital signs are stable alert oriented x3.  Pulses are palpable.  There is no erythema edema cellulitis drainage or odor.  She still has pain on palpation and range of motion of the second metatarsophalangeal joint of the right foot.  Assessment: Capsulitis neuritis second metatarsophalangeal joint right foot.  Plan: At this point failure of conservative therapies to alleviate symptoms of the second metatarsophalangeal joint several requesting an MRI for evaluation.  Differential diagnosis and surgical evaluation.

## 2020-10-03 ENCOUNTER — Other Ambulatory Visit: Payer: Self-pay

## 2020-10-03 DIAGNOSIS — N951 Menopausal and female climacteric states: Secondary | ICD-10-CM

## 2020-10-03 DIAGNOSIS — F418 Other specified anxiety disorders: Secondary | ICD-10-CM

## 2020-10-03 MED ORDER — PAROXETINE HCL 10 MG PO TABS: 10.0000 mg | ORAL_TABLET | Freq: Every day | ORAL | 1 refills | Status: DC

## 2020-10-09 ENCOUNTER — Telehealth: Payer: Self-pay | Admitting: Family Medicine

## 2020-10-09 NOTE — Telephone Encounter (Signed)
Patient will call back to schedule ?

## 2020-10-09 NOTE — Telephone Encounter (Signed)
Left message for patient to call back and schedule Medicare Annual Wellness Visit (AWV) either virtually or in office. No detailed message   Last AWV 12/20/2018 please schedule at anytime with [facility] Nurse Health Advisor 1 or 2   This should be a 45 minute visit.

## 2020-10-10 ENCOUNTER — Other Ambulatory Visit: Payer: Self-pay

## 2020-10-10 DIAGNOSIS — I10 Essential (primary) hypertension: Secondary | ICD-10-CM

## 2020-10-10 MED ORDER — PROPRANOLOL HCL 40 MG PO TABS: 40.0000 mg | ORAL_TABLET | Freq: Two times a day (BID) | ORAL | 2 refills | Status: DC

## 2020-10-14 ENCOUNTER — Ambulatory Visit
Admission: RE | Admit: 2020-10-14 | Discharge: 2020-10-14 | Disposition: A | Discharge status: Home / Self Care | Payer: PPO | Source: Ambulatory Visit | Attending: Podiatry | Admitting: Podiatry

## 2020-10-14 DIAGNOSIS — M79671 Pain in right foot: Secondary | ICD-10-CM | POA: Diagnosis not present

## 2020-10-14 DIAGNOSIS — M778 Other enthesopathies, not elsewhere classified: Secondary | ICD-10-CM

## 2020-10-14 DIAGNOSIS — R600 Localized edema: Secondary | ICD-10-CM | POA: Diagnosis not present

## 2020-10-14 DIAGNOSIS — M25474 Effusion, right foot: Secondary | ICD-10-CM | POA: Diagnosis not present

## 2020-10-23 DIAGNOSIS — M5416 Radiculopathy, lumbar region: Secondary | ICD-10-CM | POA: Diagnosis not present

## 2020-10-31 ENCOUNTER — Other Ambulatory Visit: Payer: Self-pay | Admitting: Family Medicine

## 2020-10-31 DIAGNOSIS — M545 Low back pain, unspecified: Secondary | ICD-10-CM

## 2020-11-10 ENCOUNTER — Telehealth: Payer: Self-pay

## 2020-11-10 DIAGNOSIS — I10 Essential (primary) hypertension: Secondary | ICD-10-CM

## 2020-11-10 DIAGNOSIS — E038 Other specified hypothyroidism: Secondary | ICD-10-CM

## 2020-11-10 NOTE — Telephone Encounter (Signed)
-----   Message from Viona Gilmore, [facility] sent at 11/10/2020  5:09 PM EDT ----- Regarding: CCM referral Hi,  Can you please place a CCM referral for Ms. Elsayed?  Thank you, Maddie

## 2020-11-10 NOTE — Telephone Encounter (Signed)
-----   Message from Garrel Ridgel, [place] sent at 10/15/2020  7:11 AM EST ----- MRI suggesting a bursitis or ganglion or possibly a seroma.  I would like to get an over read on this so please send for the disc and sent to [facility] radiology.  Please notify the patient of the delay

## 2020-11-10 NOTE — Telephone Encounter (Signed)
Left message for patient to call back to discuss MRI results      Faxed request for MRI disc and report to [place]

## 2020-11-17 ENCOUNTER — Telehealth: Payer: Self-pay | Admitting: Pharmacist

## 2020-11-17 NOTE — Chronic Care Management (AMB) (Signed)
Chronic Care Management Pharmacy Assistant   Name: Kristina [name]  MRN: [id number] DOB: 10-Jun-1951  Reason for Encounter: Chart Review for CPP visit on 11/21/2020.   Conditions to be addressed/monitored: HTN, HLD, Anxiety and GERD, Hypothyroidism, Osteoarthritis, Osteopenia, Insomnia, Menopause  Primary concerns for visit include: Medication Review, Weight Loss, Allergies, Medication Cost  Recent office visits:  08/19/2020- Dr. Martinique (PCP)- Calcium Carbonate-Vit D, Duloxetine 30 mg and 20 mg discontinued.  Recent consult visits:  09/01/2020- Dr Johnsie Cancel (Cardiology)- Lower Extremity Doppler Study 09/09/2020- Dr. Nicole Kindred (Dermatology)- History of SCC (squamous cell carcinoma) of skin/ Rosacea-  Start Carac cream qhs to nose, malar cheek, upper lip x 4 weeks, Recommend daily broad spectrum sunscreen SPF 30+ to sun-exposed areas, reapply every 2 hours as needed. Cont Dove soap, recommend Cerave SA cream, or Amlactin Rapid Relief, Can use OTC arnica containing moisturizer such as Dermend Bruise Formula, Cont Azelaic Acid gel qd/bid 50g x 3, 3rf. 10/01/2020- Dr Milinda Pointer (Podiatry)- Neuroma of foot- MRI right foot/ Lumbar Radiculopathy- Epidural Steroid Injection scheduled for 10/23/2020. 10/23/2020- Dr. Nelva Bush (Ortho)- Back [place] visits:  None in previous 6 months   Have you seen any other providers since your last visit? Yes- Patient has seen her PCP, Cardiologist, Dermatologist, Podiatrist and Orthopedist in the last 6 months, see visits above.  Any changes in your medications or health? Yes, patient has had a flare up of her Rosacea, Duloxetine discontinued, patient is dealing with Allergy symptoms- sneezing, itchy eyes and ears, trying Allegra but not helping much, still sneezing. Right pain- Patient using Tylenol as needed along with Voltaren gel as needed for pain. Recent Hip xray normal.  Any side effects from any medication? None per patient Do you have an symptoms or problems  not managed by your medications?  None per patient.  Any concerns about your health right now? Yes, allergy symptoms, right hip pain, Dizziness.  Has your provider asked that you check blood pressure, blood sugar, or follow special diet at home? Yes, patient checks blood pressures daily, she is also doing a low calorie diet. Recent blood pressure readings 125/77 and 131/75.  Do you get any type of exercise on a regular basis? Yes, patient usually walks, had not done much in a few weeks due to hip pain and allergies.  Can you think of a goal you would like to reach for your health? Weight loss- patient mentioned back in 2019 she joined the [place] [facility] program and lost 27 lbs which she has gained back. She is trying to use the same diet options again 1000 calorie diet but has not lost any weight.  Do you have any problems getting your medications? Patient does not have any problems getting medications she does have an issue with the cost of Finacea 15% cream, she uses Goodrx which gives her 3 tubes for $121 which can last a little over 9 months from [facility] but still costly for her. Her insurance will not cover and [facility] is the only location with a lower cost for the generic product.   Is there anything that you would like to discuss during the appointment? Suggestions on allergy medications, weight gain and diet options, Estradiol patch- patient is trying to wean off, uses for hormonal night sweats.  Patient aware to have all medications and supplements along with blood pressure logs near during telephone visit with Jeni Salles, CPP on 11/21/2020 at 9 am. Appointment confirmed.  Current medications per patient: Synthroid  25 mg- 1 tablet daily before breakfast- Last filled 09/21/2020 for 90 day supply. Propanolol 40 mg- 1 tablet twice daily- Last filled 10/13/2020 for 90 day supply. Paroxetine 10 mg- 1 tablet daily- Last filled 10/06/2020 for 90 day supply.   **Meclizine 12.5 mg- 1 tablet three times a day as needed for dizziness- No fill history (Patient states she is using more lately due to allergy symptoms, ears itching, she will have some dizziness turning over in the bed at  Night and sometimes when she get up in the morning) Losartan Potassium 50 mg- 1 tablet daily - Last filled 11/21/2019 **Estradiol Patch 0.375 mg- Apply 1 patch topically twice a week- Last filled 09/23/2020 for 90 day supply (Patient states she is using once patch a week, trying to wean off, if night sweats start then she will use two patches a week.) **Duloxetine 60 mg- 1 tablet daily- Last filled 03/19/2020 for 30 day supply (Patient no longer taking medication discontinued 08/19/2020). Atorvastatin 10 mg- 1 tablet daily- Last filled 09/18/2020 for 90 day supply  **Alprazolam 0.25 mg- 1 tablet twice a day as needed for anxiety- last filled 10/31/2020 for 23 days supply. (Patient states she is taking medication every night, helps her anxiety and sleep) **Albuterol inhaler- Inhale 2 puffs into the lungs every 6 hours as needed for wheezing or shortness of breath- Last filled 11/01/2018 for 90 day supply. (Patient uses when she goes outside to walk, she notices she will get out of breath and if she know in advance she will be going outside she will use 2 puffs prior.) Voltaren 1% gel- Use sparing as needed- Last filled 11/21/2019  Cyclobenzaprine 5 mg- 1 tablet at bedtime-  Last filled 08/09/2020 for 90 day supply **Acetaminophen- Take 400 mg twice daily ( Patient states she is not using as much only prn for hip pain) Vitamin D- OTC- 1 tablet daily Multivitamin- OTC- 1 tablet daily Finacea 15% cream- Apply 1 application two times daily- Last filled 01/11/2019- Patient uses [facility] at [facility] to get 3 tubes for $121.( Patient aware we will look into lower cost on medication. )  Medications: Outpatient Encounter Medications as of 11/17/2020  Medication Sig  . Acetaminophen (TYLENOL PO)  Take 400 mg by mouth 2 (two) times daily.  Marland Kitchen albuterol (PROVENTIL HFA;VENTOLIN HFA) 108 (90 Base) MCG/ACT inhaler Inhale 2 puffs into the lungs every 6 (six) hours as needed for wheezing or shortness of breath.  . ALPRAZolam (XANAX) 0.25 MG tablet Take 1 tablet by mouth twice daily as needed for anxiety  . atorvastatin (LIPITOR) 10 MG tablet Take 1 tablet (10 mg total) by mouth daily.  . Azelaic Acid (FINACEA) 15 % cream Apply 1 application topically 2 (two) times daily. After skin is thoroughly washed and patted dry, gently but thoroughly massage a thin film of azelaic acid cream into the affected area twice daily, in the morning and evening.  . Cholecalciferol 100 MCG (4000 UT) CAPS Take 1 capsule (4,000 Units total) by mouth daily.  . cyclobenzaprine (FLEXERIL) 5 MG tablet TAKE 1 TABLET BY MOUTH AT BEDTIME  . DULoxetine (CYMBALTA) 60 MG capsule duloxetine 60 mg capsule,delayed release   1 capsule every day by oral route.  Marland Kitchen estradiol (VIVELLE-DOT) 0.0375 MG/24HR APPLY 1 PATCH TOPICALLY TWICE A WEEK  . losartan (COZAAR) 50 MG tablet losartan 50 mg tablet   1 tablet every day by oral route.  . Magnesium 250 MG TABS Take by mouth daily.  . meclizine (ANTIVERT) 12.5  MG tablet Take 1 tablet (12.5 mg total) by mouth 3 (three) times daily as needed for dizziness.  . Multiple Vitamin (MULTIVITAMIN) tablet Take 1 tablet by mouth daily.  . pantoprazole (PROTONIX) 40 MG tablet Take 1 tablet (40 mg total) by mouth daily.  Marland Kitchen PARoxetine (PAXIL) 10 MG tablet Take 1 tablet (10 mg total) by mouth daily.  . propranolol (INDERAL) 40 MG tablet Take 1 tablet (40 mg total) by mouth 2 (two) times daily.  . RESTASIS 0.05 % ophthalmic emulsion   . SHINGRIX injection   . SYNTHROID 25 MCG tablet Take 1 tablet (25 mcg total) by mouth daily before breakfast.  . VOLTAREN 1 % GEL SMARTSIG:1 Sparingly Topical PRN   No facility-administered encounter medications on file as of 11/17/2020.    Star Rating  Drugs: Atorvastatin 10 mg- Last filled 09/18/2020 for 90 day supply at [facility]. Losartan Potassium 50 mg- Last filled 11/21/2019 for 1 day supply.  Patient assistance Coordination- Patient aware I am looking further into getting her assistance with Finacea cream for Rosacea.  Jeni Salles, CPP notified.   SIG: Pattricia Boss, [place] Pharmacist Assistant [phone]

## 2020-11-20 ENCOUNTER — Telehealth: Payer: Self-pay | Admitting: Pharmacist

## 2020-11-20 NOTE — Progress Notes (Signed)
Chronic Care Management Pharmacy Note  12/04/2020 Name:  Kristina [name] MRN:  [id number] DOB:  Jun 02, 1951  Subjective: Kristina [name] is an 70 y.o. year old female who is a primary patient of Martinique, Malka So, MD.  The CCM team was consulted for assistance with disease management and care coordination needs.    Engaged with patient by telephone for initial visit in response to provider referral for pharmacy case management and/or care coordination services.   Consent to Services:  The patient was given the following information about Chronic Care Management services today, agreed to services, and gave verbal consent: 1. CCM service includes personalized support from designated clinical staff supervised by the primary care provider, including individualized plan of care and coordination with other care providers 2. 24/7 contact phone numbers for assistance for urgent and routine care needs. 3. Service will only be billed when office clinical staff spend 20 minutes or more in a month to coordinate care. 4. Only one practitioner may furnish and bill the service in a calendar month. 5.The patient may stop CCM services at any time (effective at the end of the month) by phone call to the office staff. 6. The patient will be responsible for cost sharing (co-pay) of up to 20% of the service fee (after annual deductible is met). Patient agreed to services and consent obtained.  Patient Care Team: Martinique, Betty G, MD as PCP - General (Family Medicine) Viona Gilmore, [facility] as Pharmacist (Pharmacist)  Recent office visits:  08/19/2020- Dr. Martinique (PCP)- Calcium Carbonate-Vit D, Duloxetine 30 mg and 20 mg discontinued.  Recent consult visits:  09/01/2020- Dr Johnsie Cancel (Cardiology)- Lower Extremity Doppler Study 09/09/2020- Dr. Nicole Kindred (Dermatology)- History of SCC (squamous cell carcinoma) of skin/ Rosacea-  Start Carac cream qhs to nose, malar cheek, upper lip x 4 weeks, Recommend daily broad spectrum sunscreen  SPF 30+ to sun-exposed areas, reapply every 2 hours as needed. Cont Dove soap, recommend Cerave SA cream, or Amlactin Rapid Relief, Can use OTC arnica containing moisturizer such as Dermend Bruise Formula, Cont Azelaic Acid gel qd/bid 50g x 3, 3rf. 10/01/2020- Dr Milinda Pointer (Podiatry)- Neuroma of foot- MRI right foot/ Lumbar Radiculopathy- Epidural Steroid Injection scheduled for 10/23/2020. 10/23/2020- Dr. Nelva Bush (Ortho)- Back [facility] visits:  None in previous 6 months   Objective:  Lab Results  Component Value Date   CREATININE 0.88 08/19/2020   BUN 25 (H) 08/19/2020   GFR 66.68 08/19/2020   GFRNONAA 62 11/21/2019   GFRAA 72 11/21/2019   NA 134 (L) 08/19/2020   K 4.6 08/19/2020   CALCIUM 9.0 08/19/2020   CO2 22 08/19/2020   GLUCOSE 108 (H) 08/19/2020    Lab Results  Component Value Date/Time   HGBA1C 5.5 11/21/2019 11:00 AM   HGBA1C 5.3 07/24/2019 11:15 AM   GFR 66.68 08/19/2020 10:34 AM   GFR 57.28 (L) 05/19/2018 10:44 AM   MICROALBUR <0.7 05/19/2018 10:44 AM    Last diabetic Eye exam: No results found for: HMDIABEYEEXA  Last diabetic Foot exam: No results found for: HMDIABFOOTEX   Lab Results  Component Value Date   CHOL 189 11/21/2019   HDL 52 11/21/2019   LDLCALC 121 (H) 11/21/2019   TRIG 87 11/21/2019   CHOLHDL 4.2 08/04/2017    Hepatic Function Latest Ref Rng & Units 11/21/2019 07/24/2019 03/26/2019  Total Protein 6.0 - 8.5 g/dL 6.7 6.4 6.5  Albumin 3.8 - 4.8 g/dL 4.2 4.0 3.9  AST 0 - 40 IU/L 28 28 23   ALT  0 - 32 IU/L 34(H) 41(H) 22  Alk Phosphatase 39 - 117 IU/L 84 95 81  Total Bilirubin 0.0 - 1.2 mg/dL 0.3 0.4 0.3    Lab Results  Component Value Date/Time   TSH 2.190 11/21/2019 11:00 AM   TSH 1.970 07/24/2019 11:15 AM   FREET4 1.33 11/21/2019 11:00 AM   FREET4 1.39 07/24/2019 11:15 AM    CBC Latest Ref Rng & Units 01/18/2018 09/09/2017 11/25/2015  WBC 3.6 - 11.0 K/uL 11.6(H) 7.5 7.6  Hemoglobin 12.0 - 16.0 g/dL 14.6 13.5 13.7  Hematocrit 35.0 -  47.0 % 42.8 40.5 41  Platelets 150 - 440 K/uL 308 278 264    Lab Results  Component Value Date/Time   VD25OH 71.1 11/21/2019 11:00 AM   VD25OH 64.9 07/24/2019 11:15 AM   VD25OH 44.91 12/16/2017 11:16 AM    Clinical ASCVD: No  The 10-year ASCVD risk score Mikey Bussing DC Jr., et al., 2013) is: 14.6%   Values used to calculate the score:     Age: 16 years     Sex: Female     Is Non-Hispanic African American: No     Diabetic: No     Tobacco smoker: No     Systolic Blood Pressure: 591 mmHg     Is BP treated: Yes     HDL Cholesterol: 52 mg/dL     Total Cholesterol: 189 mg/dL    Depression screen [facility] 2/9 08/19/2020 12/22/2019 03/26/2019  Decreased Interest 0 0 1  Down, Depressed, Hopeless 0 0 2  PHQ - 2 Score 0 0 3  Altered sleeping 0 1 0  Tired, decreased energy 1 1 3   Change in appetite 0 0 2  Feeling bad or failure about yourself  0 0 2  Trouble concentrating 0 0 1  Moving slowly or fidgety/restless 0 0 1  Suicidal thoughts 0 0 0  PHQ-9 Score 1 2 12   Difficult doing work/chores Not difficult at all Not difficult at all Somewhat difficult      Social History   Tobacco Use  Smoking Status Former Smoker  . Packs/day: 1.50  . Years: 25.00  . Pack years: 37.50  . Quit date: 03/03/1990  . Years since quitting: 30.7  Smokeless Tobacco Never Used   BP Readings from Last 3 Encounters:  08/19/20 120/74  03/19/20 122/70  12/18/19 110/68   Pulse Readings from Last 3 Encounters:  08/19/20 84  03/19/20 78  12/18/19 67   Wt Readings from Last 3 Encounters:  08/19/20 175 lb (79.4 kg)  03/19/20 166 lb 6 oz (75.5 kg)  12/18/19 155 lb (70.3 kg)   BMI Readings from Last 3 Encounters:  08/19/20 32.01 kg/m  03/19/20 30.43 kg/m  12/18/19 28.35 kg/m    Assessment/Interventions: Review of patient past medical history, allergies, medications, health status, including review of consultants reports, laboratory and other test data, was performed as part of comprehensive evaluation and  provision of chronic care management services.   SDOH:  (Social Determinants of Health) assessments and interventions performed: Yes SDOH Interventions   Flowsheet Row Most Recent Value  SDOH Interventions   Financial Strain Interventions Intervention Not Indicated     SDOH Screenings   Alcohol Screen: Not on file  Depression (PHQ2-9): Low Risk   . PHQ-2 Score: 1  Financial Resource Strain: Low Risk   . Difficulty of Paying Living Expenses: Not hard at all  Food Insecurity: Not on file  Housing: Not on file  Physical Activity: Not on file  Social Connections: Not on file  Stress: Not on file  Tobacco Use: Medium Risk  . Smoking Tobacco Use: Former Smoker  . Smokeless Tobacco Use: Never Used  Transportation Needs: Not on file    Patient lives alone and has been having issues lately with her back and her right foot. She usually goes out during the day and used to walk 5 miles a day but hasn't been able to because of her foot. She is waiting on results form an x-ray for her foot and also reports she has concerns about gaining weight.  Patient lost almost 30 lbs with [place] weight management. She stopped her diet when her sisters husband passed away last year and she went up to [place] a couple times.  Patient reported she just re-started the diet which is a 1000 calorie diet. The meal plan includes 2 eggs, sara lee bread, 50 calorie slice of cheese for breakfast. She didn't feel super hungry and reports doing ok with the 1000 calories a day. She hasn't lost anything so far since starting up again and hasn't been able to exercise lately either. Discussed how restrictive diets are not usually sustainable and she may not be losing weight because her metabolism is slowing down.  She also started at the [facility] in March but stopped with her food. She was swimming before and needs to call to see if she can use the stairs/elevator at the facility.  She has not been sleeping well lately  and lives in an apartment that is unlivable. She lives on the first floor and children are often running up and down the steps. She is trying to move. Offered Education officer, museum help and she declined.  Patient denies any problems with her medications.    CCM Care Plan  Allergies  Allergen Reactions  . Hydrocodone Nausea Only    Per patient   . Meloxicam Other (See Comments)    "skin infection on leg"  . Sulfa Antibiotics Rash    Rash all over body/fever    Medications Reviewed Today    Reviewed by Rip Harbour, [facility] (Certified Podiatric Assistant) on 12/03/20 at 70  Med List Status: <None>  Medication Order Taking? Sig Documenting Provider Last Dose Status Informant  Acetaminophen (TYLENOL PO) [id number] No Take 500 mg by mouth 2 (two) times daily. [provider] Taking Active   albuterol (PROVENTIL HFA;VENTOLIN HFA) 108 (90 Base) MCG/ACT inhaler [id number] No Inhale 2 puffs into the lungs every 6 (six) hours as needed for wheezing or shortness of breath. Martinique, Betty G, MD Taking Active   ALPRAZolam Duanne Moron) 0.25 MG tablet [id number] No Take 1 tablet by mouth twice daily as needed for anxiety Martinique, Betty G, MD Taking Active   atorvastatin (LIPITOR) 10 MG tablet [id number] No Take 1 tablet (10 mg total) by mouth daily. Martinique, Betty G, MD Taking Active   Azelaic Acid (FINACEA) 15 % cream [id number] No Apply 1 application topically 2 (two) times daily. After skin is thoroughly washed and patted dry, gently but thoroughly massage a thin film of azelaic acid cream into the affected area twice daily, in the morning and evening. [name], MD Taking Active   cyclobenzaprine (FLEXERIL) 5 MG tablet [id number] No TAKE 1 TABLET BY MOUTH AT BEDTIME Martinique, Betty G, MD Taking Active   estradiol (VIVELLE-DOT) 0.0375 Rubbie Battiest [id number] No APPLY 1 PATCH TOPICALLY TWICE A WEEK [provider] Taking Active   Magnesium 250 MG TABS [id number] No Take by mouth daily. [provider] Taking Active   meclizine (ANTIVERT) 12.5 MG tablet [id number] No Take 1 tablet (12.5 mg total) by mouth 3 (three) times daily as needed for dizziness. Martinique, Betty G, MD Taking Active   Multiple Vitamin (MULTIVITAMIN) tablet [id number] No Take 1 tablet by mouth daily. [provider] Taking Active   pantoprazole (PROTONIX) 40 MG tablet [id number] No Take 1 tablet (40 mg total) by mouth daily. Martinique, Betty G, MD Taking Active   PARoxetine (PAXIL) 10 MG tablet [id number] No Take 1 tablet (10 mg total) by mouth daily. Martinique, Betty G, MD Taking Active   propranolol (INDERAL) 40 MG tablet [id number] No Take 1 tablet (40 mg total) by mouth 2 (two) times daily. Martinique, Betty G, MD Taking Active   RESTASIS 0.05 % ophthalmic emulsion [id number] No Place into both eyes 2 (two) times daily. [provider] Taking Active   SYNTHROID 25 MCG tablet [id number]  Take 1 tablet (25 mcg total) by mouth daily before breakfast. Martinique, Betty G, MD  Active   VOLTAREN 1 % GEL [id number] No SMARTSIG:1 Sparingly Topical PRN [provider] Taking Active           Patient Active Problem List   Diagnosis Date Noted  . Acquired absence of both cervix and uterus 10/01/2020  . Age-related osteoporosis without current pathological fracture 10/01/2020  . Atrophy of vagina 10/01/2020  . Cervical high risk human papillomavirus (HPV) DNA test positive 10/01/2020  . Menopause 10/01/2020  . Stress incontinence (female) (female) 10/01/2020  . Prediabetes 07/26/2019  . Insulin resistance 06/14/2019  . Dupuytren's disease of palm 02/24/2019  . Trigger finger 02/24/2019  . Hypothyroidism 05/19/2018  . B12 deficiency 05/19/2018  . Vitamin D deficiency 12/16/2017  . Class 1 obesity with serious comorbidity and body mass index (BMI) of 30.0 to 30.9 in adult 12/16/2017  . Benign essential tremor 07/26/2017  . Thyroid nodule 07/04/2017  . Essential hypertension 06/07/2017  . GERD  (gastroesophageal reflux disease) 03/30/2016  . 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 Anxiety disorder 03/29/2016  . Insomnia 03/29/2016  . Osteopenia 03/29/2016  . Migraine headache without aura 03/29/2016  . Hyperlipidemia 03/29/2016  . History of basal cell carcinoma 03/29/2016  . Generalized osteoarthritis of multiple sites 03/29/2016  . Back pain, chronic 03/29/2016    Immunization History  Administered Date(s) Administered  . Influenza, High Dose Seasonal PF 05/03/2017, 05/05/2018, 05/10/2019, 06/18/2019, 05/19/2020  . Influenza-Unspecified 05/03/2017  . PFIZER(Purple Top)SARS-COV-2 Vaccination 09/29/2019, 10/23/2019, 05/19/2020  . Pneumococcal Polysaccharide-23 05/03/2017  . Tdap 08/10/2007, 10/05/2019  . Zoster Recombinat (Shingrix) 05/10/2019, 06/18/2019    Conditions to be addressed/monitored:  Hypertension, Hyperlipidemia, GERD, Hypothyroidism, Anxiety, Osteopenia, Osteoarthritis and prediabetes, post menopausal    Care Plan : [place]  Updates made by Viona Gilmore, [place] since 12/04/2020 12:00 AM    Problem: Problem: Hypertension, Hyperlipidemia, GERD, Hypothyroidism, Anxiety, Osteopenia, Osteoarthritis and prediabetes, post menopausal     Long-Range Goal: Patient-Specific Goal   Start Date: 11/21/2020  Expected End Date: 11/21/2021  This Visit's Progress: On track  Priority: High  Note:   Current Barriers:  . Unable to independently afford treatment regimen . Unable to independently monitor therapeutic efficacy . Unable to achieve control of cholesterol   Pharmacist Clinical Goal(s):  Marland Kitchen Patient will achieve adherence to monitoring guidelines and medication adherence to achieve therapeutic efficacy . achieve control of cholesterol as evidenced by next lipid panel . maintain control of blood pressure as evidenced by home blood pressure readings  through  collaboration with PharmD and provider.   Interventions: . 1:1 collaboration with Martinique, Betty G,  MD regarding development and update of comprehensive plan of care as evidenced by provider attestation and co-signature . Inter-disciplinary care team collaboration (see longitudinal plan of care) . Comprehensive medication review performed; medication list updated in electronic medical record  Hypertension (BP goal <140/90) -Controlled -Current treatment: . Propranolol 40 mg 1 tablet twice daily -Medications previously tried: n/a  -Current home readings: 121/71, HR 72, highest 137/79 pulse was 71, 132/82 (a couple times a week) -Current dietary habits: doesn't cook with it at all; uses with eggs - looks at sodium on package labels -Current exercise habits: limited right now with ankle -Denies hypotensive/hypertensive symptoms -Educated on Exercise goal of 150 minutes per week; Importance of home blood pressure monitoring; Proper BP monitoring technique; -Counseled to monitor BP at home at least weekly, document, and provide log at future appointments -Counseled on diet and exercise extensively Recommended to continue current medication  Hyperlipidemia: (LDL goal < 100) -Uncontrolled -Current treatment: . Atorvastatin 10 mg 1 tablet daily -Medications previously tried: none  -Current dietary patterns: no fried foods with diet -Current exercise habits: limited with ankle -Educated on Cholesterol goals;  Importance of limiting foods high in cholesterol; Exercise goal of 150 minutes per week; -Counseled on diet and exercise extensively Recommended to continue current medication Recommended repeat lipid panel and possible dose increase  Pre-diabetes (A1c goal <6.5%) -Controlled -Current medications: . No medications -Medications previously tried: none  -Current home glucose readings . fasting glucose: does not need to check . post prandial glucose: does not need to check -Denies hypoglycemic/hyperglycemic symptoms -Current meal patterns:  . breakfast: 2 eggs, sara lee bread,  50 calorie slice of cheese . Lunch/dinner: [place] and roast beef, mustard, no mayo, fruit (peach, berries, apple), cup of vegetables  . snacks: 100 calories or less . drinks: did not discuss -Current exercise: limited with ankle -Educated on A1c and blood sugar goals; Exercise goal of 150 minutes per week; Carbohydrate counting and/or plate method -Counseled to check feet daily and get yearly eye exams -Counseled on diet and exercise extensively  Depression/Anxiety/Insomnia (Goal: minimize symptoms and improve quality and quantity of sleep) -Controlled -Current treatment: . Alprazolam 0.25 mg 1 tablet twice daily as needed . Paroxetine 10 mg 1 tablet daily -Medications previously tried/failed: duloxetine -PHQ9: 1 -GAD7: 5 -Educated on Benefits of medication for symptom control Benefits of cognitive-behavioral therapy with or without medication -Counseled on using alprazolam sparingly due to the long term risks of falls, fractures and memory loss  Osteopenia (Goal prevent fractures) -Controlled -Last DEXA Scan: 2021   T-Score femoral neck: -1.6  T-Score total hip: n/a  T-Score lumbar spine: -0.3  T-Score forearm radius: n/a  10-year probability of major osteoporotic fracture: 9.8%  10-year probability of hip fracture: 1.5% -Patient is not a candidate for pharmacologic treatment -Current treatment  . Vitamin D 5000 units daily . Calcium 650 mg - 500 units of vitamin daily . 300 mg calcium in multivitamin daily -Medications previously tried: none  -Recommend [phone] units of vitamin D daily. Recommend 1200 mg of calcium daily from dietary and supplemental sources. Recommend weight-bearing and muscle strengthening exercises for building and maintaining bone density. -Counseled on diet and exercise extensively  Post menopausal (Goal: minimize symptoms of menopause) -Controlled -Current treatment  . Estradiol 0.0375 mg/24 hr 1 patch twice weekly -Medications previously tried:  none  -Counseled on long term risks of estrogen therapy and patient is trying  to use weekly and wean herself off  GERD (Goal: minimize symptoms) -Not ideally controlled -Current treatment  . Pantoprazole 40 mg 1 tablet daily -Medications previously tried: none  -Counseled on non-pharmacologic management of symptoms such as elevating the head of your bed, avoiding eating 2-3 hours before bed, avoiding triggering foods such as acidic, spicy, or fatty foods, eating smaller meals, and wearing clothes that are loose around the waist Recommended moving to prior to lunch as she wasn't taking before a meal  Hypothyroidism (Goal: TSH 0.35-4.5) -Controlled -Current treatment  . Synthroid 25 mcg 1 tablet daily before breakfast -Medications previously tried: none  -Recommended to continue current medication Counseled on taking on an empty stomach with water  Dry eye (Goal: minimize symptoms) -Controlled -Current treatment  . Restasis 0.05% one drop in both eyes twice daily -Medications previously tried: none  -Recommended to continue current medication  Pain (Goal: minimize pain) -Controlled -Current treatment  . Acetaminophen 400 mg 1 tablet as needed . Cyclobenzaprine 5 mg 1 tablet at bedtime as needed . Voltaren gel 1% as needed -Medications previously tried: n/a  -Counseled on maximum recommended daily dose of 3000 mg of Tylenol  Allergic rhinitis (Goal: minimize symptoms) -Uncontrolled -Current treatment  . Allegra 180 mg 1 tablet daily -Medications previously tried: none  -Recommended trial of Flonase daily or Zyrtec if Allegra is not helping   Health Maintenance -Vaccine gaps: Prevnar -Current therapy:  . Magnesium 250 mg 1 tablet daily . Multivitamin 1 tablet daily . Azelaic acid 15% cream . Meclizine 12.5 mg 1 tablet as needed . Centrum silver 50+ 1 tablet daily . Albuterol before walking -Educated on Cost vs benefit of each product must be carefully weighed by  individual consumer -Patient is satisfied with current therapy and denies issues -Recommended to continue current medication  Patient Goals/Self-Care Activities . Patient will:  - take medications as prescribed check blood pressure at least weekly, document, and provide at future appointments target a minimum of 150 minutes of moderate intensity exercise weekly  Follow Up Plan: Telephone follow up appointment with care management team member scheduled for: 4 months      Medication Assistance: Application for Restasis  medication assistance program. in process.  Anticipated assistance start date 01/03/21.  See plan of care for additional detail.  Patient's preferred pharmacy is:  [facility] [address], [place] - [address] [place] [address] [place] [address] Phone: [phone] Fax: [phone]  [place] ([place], [place] [address] Phone: [phone] Fax: [place], [place] [address] Phone: [phone] Fax: [phone]  Uses pill box? Yes - three times a day Pt endorses 100% compliance  We discussed: Benefits of medication synchronization, packaging and delivery as well as enhanced pharmacist oversight with [employer]. Patient decided to: Continue current medication management strategy  Care Plan and Follow Up Patient Decision:  Patient agrees to Care Plan and Follow-up.  Plan: Telephone follow up appointment with care management team member scheduled for:  4 months  Jeni Salles, PharmD BCACP Clinical Pharmacist [facility] at [place] calls when it due  Will think about it   4 months - 9am is good time

## 2020-11-20 NOTE — Chronic Care Management (AMB) (Signed)
I spoke with the patient about her upcoming appointment on 11/21/2020 @ 9:00 am with the clinical pharmacist. She was asked to please have all medication on hand to review with the pharmacist. She confirmed the appointment.   Maia Breslow, [place] [phone]

## 2020-11-21 ENCOUNTER — Ambulatory Visit (INDEPENDENT_AMBULATORY_CARE_PROVIDER_SITE_OTHER): Payer: PPO | Admitting: Pharmacist

## 2020-11-21 DIAGNOSIS — I10 Essential (primary) hypertension: Secondary | ICD-10-CM

## 2020-11-21 DIAGNOSIS — E785 Hyperlipidemia, unspecified: Secondary | ICD-10-CM | POA: Diagnosis not present

## 2020-11-24 ENCOUNTER — Other Ambulatory Visit: Payer: Self-pay

## 2020-11-24 DIAGNOSIS — M5459 Other low back pain: Secondary | ICD-10-CM | POA: Diagnosis not present

## 2020-11-24 DIAGNOSIS — E038 Other specified hypothyroidism: Secondary | ICD-10-CM

## 2020-11-24 MED ORDER — SYNTHROID 25 MCG PO TABS: 25.0000 ug | ORAL_TABLET | Freq: Every day | ORAL | 2 refills | Status: DC

## 2020-11-25 ENCOUNTER — Telehealth: Payer: Self-pay

## 2020-11-25 NOTE — Telephone Encounter (Signed)
Mailed disc and report to [facility]

## 2020-11-25 NOTE — Telephone Encounter (Signed)
-----   Message from Garrel Ridgel, [place] sent at 10/15/2020  7:11 AM EST ----- MRI suggesting a bursitis or ganglion or possibly a seroma.  I would like to get an over read on this so please send for the disc and sent to [facility] radiology.  Please notify the patient of the delay

## 2020-12-03 ENCOUNTER — Ambulatory Visit: Payer: PPO | Admitting: Podiatry

## 2020-12-03 ENCOUNTER — Encounter: Payer: Self-pay | Admitting: Podiatry

## 2020-12-03 ENCOUNTER — Other Ambulatory Visit: Payer: Self-pay

## 2020-12-03 ENCOUNTER — Encounter: Payer: Self-pay | Admitting: *Deleted

## 2020-12-03 DIAGNOSIS — M778 Other enthesopathies, not elsewhere classified: Secondary | ICD-10-CM | POA: Diagnosis not present

## 2020-12-03 DIAGNOSIS — S99921D Unspecified injury of right foot, subsequent encounter: Secondary | ICD-10-CM

## 2020-12-03 NOTE — Progress Notes (Signed)
She presents today for follow-up of her MRI the MRI was sent for an over read and we just received it back this morning.  She states that nothing is changed.  She goes on to say that her hip and her back and her knee are also hurting.  Relates back injury at one time currently seeing Dr. Herma Mering.  The MRI states that there appears to be a slight insertional tear at the second metatarsophalangeal joint consistent with our clinical findings.  Capsulitis with a plantar plate tear second metatarsophalangeal joint.  Osteoarthritis of the tibial sesamoid.  Assessment capsulitis plantar plate tear second digit right foot  Plan: Discussed etiology pathology conservative surgical therapies provide her with 3 options: Surgical correction via second metatarsal osteotomy screw fixation.  Next option would be physical therapy.  Next option would be wearing stiff soled shoes.  We also discussed using her flat surgical shoe.  She states that she will most likely try that for a while.  If she needs a referral to physical therapy we will write her 1 if she would decide she wants a surgical intervention we will have her in for consult.

## 2020-12-04 NOTE — Patient Instructions (Addendum)
Hi Kristina [name],  It was great to get to meet you over the telephone! Below is a summary of some of the topics we discussed.   I have also attached the Restasis application that we discussed for you to return to your eye doctor's office.  Please reach out to me if you have any questions or need anything before our follow up!  Best, Maddie  Jeni Salles, PharmD, [facility] [phone]  Visit Information  Goals Addressed            This Visit's Progress   . Manage My Medicine       Timeframe:  Long-Range Goal Priority:  Medium Start Date:                             Expected End Date:                       Follow Up Date 05/22/2022    - call for medicine refill 2 or 3 days before it runs out - keep a list of all the medicines I take; vitamins and herbals too - use a pillbox to sort medicine    Why is this important?   . These steps will help you keep on track with your medicines.   Notes:       Patient Care Plan: CCM Pharmacy Care Plan    Problem Identified: Problem: Hypertension, Hyperlipidemia, GERD, Hypothyroidism, Anxiety, Osteopenia, Osteoarthritis and prediabetes, post menopausal     Long-Range Goal: Patient-Specific Goal   Start Date: 11/21/2020  Expected End Date: 11/21/2021  This Visit's Progress: On track  Priority: High  Note:   Current Barriers:  . Unable to independently afford treatment regimen . Unable to independently monitor therapeutic efficacy . Unable to achieve control of cholesterol   Pharmacist Clinical Goal(s):  Marland Kitchen Patient will achieve adherence to monitoring guidelines and medication adherence to achieve therapeutic efficacy . achieve control of cholesterol as evidenced by next lipid panel . maintain control of blood pressure as evidenced by home blood pressure readings  through collaboration with PharmD and provider.   Interventions: . 1:1 collaboration with [place], Betty G, MD regarding  development and update of comprehensive plan of care as evidenced by provider attestation and co-signature . Inter-disciplinary care team collaboration (see longitudinal plan of care) . Comprehensive medication review performed; medication list updated in electronic medical record  Hypertension (BP goal <140/90) -Controlled -Current treatment: . Propranolol 40 mg 1 tablet twice daily -Medications previously tried: n/a  -Current home readings: 121/71, HR 72, highest 137/79 pulse was 71, 132/82 (a couple times a week) -Current dietary habits: doesn't cook with it at all; uses with eggs - looks at sodium on package labels -Current exercise habits: limited right now with ankle -Denies hypotensive/hypertensive symptoms -Educated on Exercise goal of 150 minutes per week; Importance of home blood pressure monitoring; Proper BP monitoring technique; -Counseled to monitor BP at home at least weekly, document, and provide log at future appointments -Counseled on diet and exercise extensively Recommended to continue current medication  Hyperlipidemia: (LDL goal < 100) -Uncontrolled -Current treatment: . Atorvastatin 10 mg 1 tablet daily -Medications previously tried: none  -Current dietary patterns: no fried foods with diet -Current exercise habits: limited with ankle -Educated on Cholesterol goals;  Importance of limiting foods high in cholesterol; Exercise goal of 150 minutes per week; -Counseled on diet and exercise extensively Recommended  to continue current medication Recommended repeat lipid panel and possible dose increase  Pre-diabetes (A1c goal <6.5%) -Controlled -Current medications: . No medications -Medications previously tried: none  -Current home glucose readings . fasting glucose: does not need to check . post prandial glucose: does not need to check -Denies hypoglycemic/hyperglycemic symptoms -Current meal patterns:  . breakfast: 2 eggs, sara lee bread, 50 calorie  slice of cheese . Lunch/dinner: [place] and roast beef, mustard, no mayo, fruit (peach, berries, apple), cup of vegetables  . snacks: 100 calories or less . drinks: did not discuss -Current exercise: limited with ankle -Educated on A1c and blood sugar goals; Exercise goal of 150 minutes per week; Carbohydrate counting and/or plate method -Counseled to check feet daily and get yearly eye exams -Counseled on diet and exercise extensively  Depression/Anxiety/Insomnia (Goal: minimize symptoms and improve quality and quantity of sleep) -Controlled -Current treatment: . Alprazolam 0.25 mg 1 tablet twice daily as needed . Paroxetine 10 mg 1 tablet daily -Medications previously tried/failed: duloxetine -PHQ9: 1 -GAD7: 5 -Educated on Benefits of medication for symptom control Benefits of cognitive-behavioral therapy with or without medication -Counseled on using alprazolam sparingly due to the long term risks of falls, fractures and memory loss  Osteopenia (Goal prevent fractures) -Controlled -Last DEXA Scan: 2021   T-Score femoral neck: -1.6  T-Score total hip: n/a  T-Score lumbar spine: -0.3  T-Score forearm radius: n/a  10-year probability of major osteoporotic fracture: 9.8%  10-year probability of hip fracture: 1.5% -Patient is not a candidate for pharmacologic treatment -Current treatment  . Vitamin D 5000 units daily . Calcium 650 mg - 500 units of vitamin daily . 300 mg calcium in multivitamin daily -Medications previously tried: none  -Recommend [phone] units of vitamin D daily. Recommend 1200 mg of calcium daily from dietary and supplemental sources. Recommend weight-bearing and muscle strengthening exercises for building and maintaining bone density. -Counseled on diet and exercise extensively  Post menopausal (Goal: minimize symptoms of menopause) -Controlled -Current treatment  . Estradiol 0.0375 mg/24 hr 1 patch twice weekly -Medications previously tried: none   -Counseled on long term risks of estrogen therapy and patient is trying to use weekly and wean herself off  GERD (Goal: minimize symptoms) -Not ideally controlled -Current treatment  . Pantoprazole 40 mg 1 tablet daily -Medications previously tried: none  -Counseled on non-pharmacologic management of symptoms such as elevating the head of your bed, avoiding eating 2-3 hours before bed, avoiding triggering foods such as acidic, spicy, or fatty foods, eating smaller meals, and wearing clothes that are loose around the waist Recommended moving to prior to lunch as she wasn't taking before a meal  Hypothyroidism (Goal: TSH 0.35-4.5) -Controlled -Current treatment  . Synthroid 25 mcg 1 tablet daily before breakfast -Medications previously tried: none  -Recommended to continue current medication Counseled on taking on an empty stomach with water  Dry eye (Goal: minimize symptoms) -Controlled -Current treatment  . Restasis 0.05% one drop in both eyes twice daily -Medications previously tried: none  -Recommended to continue current medication  Pain (Goal: minimize pain) -Controlled -Current treatment  . Acetaminophen 400 mg 1 tablet as needed . Cyclobenzaprine 5 mg 1 tablet at bedtime as needed . Voltaren gel 1% as needed -Medications previously tried: n/a  -Counseled on maximum recommended daily dose of 3000 mg of Tylenol  Allergic rhinitis (Goal: minimize symptoms) -Uncontrolled -Current treatment  . Allegra 180 mg 1 tablet daily -Medications previously tried: none  -Recommended trial of Flonase daily or Zyrtec  if Allegra is not helping   Health Maintenance -Vaccine gaps: Prevnar -Current therapy:  . Magnesium 250 mg 1 tablet daily . Multivitamin 1 tablet daily . Azelaic acid 15% cream . Meclizine 12.5 mg 1 tablet as needed . Centrum silver 50+ 1 tablet daily . Albuterol before walking -Educated on Cost vs benefit of each product must be carefully weighed by individual  consumer -Patient is satisfied with current therapy and denies issues -Recommended to continue current medication  Patient Goals/Self-Care Activities . Patient will:  - take medications as prescribed check blood pressure at least weekly, document, and provide at future appointments target a minimum of 150 minutes of moderate intensity exercise weekly  Follow Up Plan: Telephone follow up appointment with care management team member scheduled for: 4 months      Kristina [name] was given information about Chronic Care Management services today including:  1. CCM service includes personalized support from designated clinical staff supervised by her physician, including individualized plan of care and coordination with other care providers 2. 24/7 contact phone numbers for assistance for urgent and routine care needs. 3. Standard insurance, coinsurance, copays and deductibles apply for chronic care management only during months in which we provide at least 20 minutes of these services. Most insurances cover these services at 100%, however patients may be responsible for any copay, coinsurance and/or deductible if applicable. This service may help you avoid the need for more expensive face-to-face services. 4. Only one practitioner may furnish and bill the service in a calendar month. 5. The patient may stop CCM services at any time (effective at the end of the month) by phone call to the office staff.  Patient agreed to services and verbal consent obtained.   The patient verbalized understanding of instructions, educational materials, and care plan provided today and agreed to receive a mailed copy of patient instructions, educational materials, and care plan.  Telephone follow up appointment with pharmacy team member scheduled for: 4 months  Viona Gilmore, [facility]  High Cholesterol  High cholesterol is a condition in which the blood has high levels of a white, waxy substance similar to fat  (cholesterol). The liver makes all the cholesterol that the body needs. The human body needs small amounts of cholesterol to help build cells. A person gets extra or excess cholesterol from the food that he or she eats. The blood carries cholesterol from the liver to the rest of the body. If you have high cholesterol, deposits (plaques) may build up on the walls of your arteries. Arteries are the blood vessels that carry blood away from your heart. These plaques make the arteries narrow and stiff. Cholesterol plaques increase your risk for heart attack and stroke. Work with your health care provider to keep your cholesterol levels in a healthy range. What increases the risk? The following factors may make you more likely to develop this condition:  Eating foods that are high in animal fat (saturated fat) or cholesterol.  Being overweight.  Not getting enough exercise.  A family history of high cholesterol (familial hypercholesterolemia).  Use of tobacco products.  Having diabetes. What are the signs or symptoms? There are no symptoms of this condition. How is this diagnosed? This condition may be diagnosed based on the results of a blood test.  If you are older than 71 years of age, your health care provider may check your cholesterol levels every 4-6 years.  You may be checked more often if you have high cholesterol or  other risk factors for heart disease. The blood test for cholesterol measures:  "Bad" cholesterol, or LDL cholesterol. This is the main type of cholesterol that causes heart disease. The desired level is less than 100 mg/dL.  "Good" cholesterol, or HDL cholesterol. HDL helps protect against heart disease by cleaning the arteries and carrying the LDL to the liver for processing. The desired level for HDL is 60 mg/dL or higher.  Triglycerides. These are fats that your body can store or burn for energy. The desired level is less than 150 mg/dL.  Total cholesterol. This  measures the total amount of cholesterol in your blood and includes LDL, HDL, and triglycerides. The desired level is less than 200 mg/dL. How is this treated? This condition may be treated with:  Diet changes. You may be asked to eat foods that have more fiber and less saturated fats or added sugar.  Lifestyle changes. These may include regular exercise, maintaining a healthy weight, and quitting use of tobacco products.  Medicines. These are given when diet and lifestyle changes have not worked. You may be prescribed a statin medicine to help lower your cholesterol levels. Follow these instructions at home: Eating and drinking  Eat a healthy, balanced diet. This diet includes: ? Daily servings of a variety of fresh, frozen, or canned fruits and vegetables. ? Daily servings of whole grain foods that are rich in fiber. ? Foods that are low in saturated fats and trans fats. These include poultry and fish without skin, lean cuts of meat, and low-fat dairy products. ? A variety of fish, especially oily fish that contain omega-3 fatty acids. Aim to eat fish at least 2 times a week.  Avoid foods and drinks that have added sugar.  Use healthy cooking methods, such as roasting, grilling, broiling, baking, poaching, steaming, and stir-frying. Do not fry your food except for stir-frying.   Lifestyle  Get regular exercise. Aim to exercise for a total of 150 minutes a week. Increase your activity level by doing activities such as gardening, walking, and taking the stairs.  Do not use any products that contain nicotine or tobacco, such as cigarettes, e-cigarettes, and chewing tobacco. If you need help quitting, ask your health care provider.   General instructions  Take over-the-counter and prescription medicines only as told by your health care provider.  Keep all follow-up visits as told by your health care provider. This is important. Where to find more information  American Heart  Association: www.heart.org  National Heart, Lung, and Blood Institute: https://wilson-eaton.com/ Contact a health care provider if:  You have trouble achieving or maintaining a healthy diet or weight.  You are starting an exercise program.  You are unable to stop smoking. Get help right away if:  You have chest pain.  You have trouble breathing.  You have any symptoms of a stroke. "BE FAST" is an easy way to remember the main warning signs of a stroke: ? B - Balance. Signs are dizziness, sudden trouble walking, or loss of balance. ? E - Eyes. Signs are trouble seeing or a sudden change in vision. ? F - Face. Signs are sudden weakness or numbness of the face, or the face or eyelid drooping on one side. ? A - Arms. Signs are weakness or numbness in an arm. This happens suddenly and usually on one side of the body. ? S - Speech. Signs are sudden trouble speaking, slurred speech, or trouble understanding what people say. ? T - Time. Time to  call emergency services. Write down what time symptoms started.  You have other signs of a stroke, such as: ? A sudden, severe headache with no known cause. ? Nausea or vomiting. ? Seizure. These symptoms may represent a serious problem that is an emergency. Do not wait to see if the symptoms will go away. Get medical help right away. Call your local emergency services (911 in the [place] Do not drive yourself to the hospital. Summary  Cholesterol plaques increase your risk for heart attack and stroke. Work with your health care provider to keep your cholesterol levels in a healthy range.  Eat a healthy, balanced diet, get regular exercise, and maintain a healthy weight.  Do not use any products that contain nicotine or tobacco, such as cigarettes, e-cigarettes, and chewing tobacco.  Get help right away if you have any symptoms of a stroke. This information is not intended to replace advice given to you by your health care provider. Make sure you discuss  any questions you have with your health care provider. Document Revised: 06/25/2019 Document Reviewed: 06/25/2019 Elsevier Patient Education  2021 [employer].

## 2020-12-08 ENCOUNTER — Telehealth: Payer: Self-pay | Admitting: Pharmacist

## 2020-12-08 NOTE — Chronic Care Management (AMB) (Signed)
    Chronic Care Management Pharmacy Assistant   Name: Cortnie Ringel  MRN: [id number] DOB: Feb 19, 1951   Reason for Encounter: Patient assistance Coordination   12/08/2020- Patient assistance application filled out for Restasis 0.05% with [facility] Assist patient assistance program. I was also able to find an application to possible help patient with cost on Financea cream with Simple Fill. Filled in application and submitted online. Called patient to inform, no answer, left message to return call.  12/09/2020- Patient returned call, informed about Simple Fill application and application placed in the mail. Patient aware and thankful for all our help.   Medications: Outpatient Encounter Medications as of 12/08/2020  Medication Sig  . Acetaminophen (TYLENOL PO) Take 500 mg by mouth 2 (two) times daily.  Marland Kitchen albuterol (PROVENTIL HFA;VENTOLIN HFA) 108 (90 Base) MCG/ACT inhaler Inhale 2 puffs into the lungs every 6 (six) hours as needed for wheezing or shortness of breath.  . ALPRAZolam (XANAX) 0.25 MG tablet Take 1 tablet by mouth twice daily as needed for anxiety  . atorvastatin (LIPITOR) 10 MG tablet Take 1 tablet (10 mg total) by mouth daily.  . Azelaic Acid (FINACEA) 15 % cream Apply 1 application topically 2 (two) times daily. After skin is thoroughly washed and patted dry, gently but thoroughly massage a thin film of azelaic acid cream into the affected area twice daily, in the morning and evening.  . cyclobenzaprine (FLEXERIL) 5 MG tablet TAKE 1 TABLET BY MOUTH AT BEDTIME  . estradiol (VIVELLE-DOT) 0.0375 MG/24HR APPLY 1 PATCH TOPICALLY TWICE A WEEK  . Magnesium 250 MG TABS Take by mouth daily.  . meclizine (ANTIVERT) 12.5 MG tablet Take 1 tablet (12.5 mg total) by mouth 3 (three) times daily as needed for dizziness.  . Multiple Vitamin (MULTIVITAMIN) tablet Take 1 tablet by mouth daily.  . pantoprazole (PROTONIX) 40 MG tablet Take 1 tablet (40 mg total) by mouth daily.  Marland Kitchen PARoxetine (PAXIL) 10  MG tablet Take 1 tablet (10 mg total) by mouth daily.  . propranolol (INDERAL) 40 MG tablet Take 1 tablet (40 mg total) by mouth 2 (two) times daily.  . RESTASIS 0.05 % ophthalmic emulsion Place into both eyes 2 (two) times daily.  Marland Kitchen SYNTHROID 25 MCG tablet Take 1 tablet (25 mcg total) by mouth daily before breakfast.  . VOLTAREN 1 % GEL SMARTSIG:1 Sparingly Topical PRN   No facility-administered encounter medications on file as of 12/08/2020.    Star Rating Drugs: Atorvastatin 10 mg- Last filled 09/18/2020 for 90 day supply at [facility]. Losartan 50 mg- Last filled 11/21/2019.  SIG: Pattricia Boss, [place] [phone]

## 2020-12-18 DIAGNOSIS — M545 Low back pain, unspecified: Secondary | ICD-10-CM | POA: Diagnosis not present

## 2020-12-24 ENCOUNTER — Telehealth: Payer: Self-pay | Admitting: Family Medicine

## 2020-12-24 NOTE — Telephone Encounter (Signed)
Kristina [name] is calling and is requesting a call back regarding getting clarification for synthroid. CB is [phone] reference number [id number]

## 2020-12-24 NOTE — Telephone Encounter (Signed)
I called and spoke with pharmacy. Pt wants the generic, not the brand name. Rx updated and they will fill.

## 2020-12-31 DIAGNOSIS — M48062 Spinal stenosis, lumbar region with neurogenic claudication: Secondary | ICD-10-CM | POA: Diagnosis not present

## 2020-12-31 DIAGNOSIS — M48061 Spinal stenosis, lumbar region without neurogenic claudication: Secondary | ICD-10-CM | POA: Insufficient documentation

## 2021-01-01 DIAGNOSIS — H35013 Changes in retinal vascular appearance, bilateral: Secondary | ICD-10-CM | POA: Diagnosis not present

## 2021-01-01 DIAGNOSIS — H43813 Vitreous degeneration, bilateral: Secondary | ICD-10-CM | POA: Diagnosis not present

## 2021-01-01 DIAGNOSIS — H26493 Other secondary cataract, bilateral: Secondary | ICD-10-CM | POA: Diagnosis not present

## 2021-01-01 DIAGNOSIS — H524 Presbyopia: Secondary | ICD-10-CM | POA: Diagnosis not present

## 2021-01-01 DIAGNOSIS — H539 Unspecified visual disturbance: Secondary | ICD-10-CM | POA: Diagnosis not present

## 2021-01-07 ENCOUNTER — Telehealth: Payer: Self-pay | Admitting: Family Medicine

## 2021-01-07 NOTE — Telephone Encounter (Signed)
Left message for patient to call back and schedule Medicare Annual Wellness Visit (AWV) either virtually or in office.   Last AWV 12/20/2018 please schedule at anytime with [facility] Nurse Health Advisor 1 or 2   This should be a 45 minute visit.

## 2021-01-09 ENCOUNTER — Telehealth: Payer: Self-pay | Admitting: Pharmacist

## 2021-01-09 NOTE — Chronic Care Management (AMB) (Signed)
Chronic Care Management Pharmacy Assistant   Name: Kristina [name]  MRN: [id number] DOB: 06-Dec-1950  Reason for Encounter: Disease State/ Hypertension Assessment Call.   Conditions to be addressed/monitored: HTN  Recent office visits:  None.   Recent consult visits:  12/03/20 [name] DPM (Podiatry) - seen for injury of plantar plate of right foot. No medication changes. Follow up as needed.   Hospital visits:  None in previous 6 months  Medications: Outpatient Encounter Medications as of 01/09/2021  Medication Sig   Acetaminophen (TYLENOL PO) Take 500 mg by mouth 2 (two) times daily.   albuterol (PROVENTIL HFA;VENTOLIN HFA) 108 (90 Base) MCG/ACT inhaler Inhale 2 puffs into the lungs every 6 (six) hours as needed for wheezing or shortness of breath.   ALPRAZolam (XANAX) 0.25 MG tablet Take 1 tablet by mouth twice daily as needed for anxiety   atorvastatin (LIPITOR) 10 MG tablet Take 1 tablet (10 mg total) by mouth daily.   cyclobenzaprine (FLEXERIL) 5 MG tablet TAKE 1 TABLET BY MOUTH AT BEDTIME   estradiol (VIVELLE-DOT) 0.0375 MG/24HR APPLY 1 PATCH TOPICALLY TWICE A WEEK   Magnesium 250 MG TABS Take by mouth daily.   meclizine (ANTIVERT) 12.5 MG tablet Take 1 tablet (12.5 mg total) by mouth 3 (three) times daily as needed for dizziness.   Multiple Vitamin (MULTIVITAMIN) tablet Take 1 tablet by mouth daily.   pantoprazole (PROTONIX) 40 MG tablet Take 1 tablet (40 mg total) by mouth daily.   PARoxetine (PAXIL) 10 MG tablet Take 1 tablet (10 mg total) by mouth daily.   propranolol (INDERAL) 40 MG tablet Take 1 tablet (40 mg total) by mouth 2 (two) times daily.   RESTASIS 0.05 % ophthalmic emulsion Place into both eyes 2 (two) times daily.   SYNTHROID 25 MCG tablet Take 1 tablet (25 mcg total) by mouth daily before breakfast.   VOLTAREN 1 % GEL SMARTSIG:1 Sparingly Topical PRN   No facility-administered encounter medications on file as of 01/09/2021.    Reviewed chart prior to  disease state call. Spoke with patient regarding BP  Recent Office Vitals: BP Readings from Last 3 Encounters:  08/19/20 120/74  03/19/20 122/70  12/18/19 110/68   Pulse Readings from Last 3 Encounters:  08/19/20 84  03/19/20 78  12/18/19 67    Wt Readings from Last 3 Encounters:  08/19/20 175 lb (79.4 kg)  03/19/20 166 lb 6 oz (75.5 kg)  12/18/19 155 lb (70.3 kg)     Kidney Function Lab Results  Component Value Date/Time   CREATININE 0.88 08/19/2020 10:34 AM   CREATININE 0.94 11/21/2019 11:00 AM   CREATININE 0.91 09/09/2017 09:59 AM   GFR 66.68 08/19/2020 10:34 AM   GFRNONAA 62 11/21/2019 11:00 AM   GFRNONAA 65 09/09/2017 09:59 AM   GFRAA 72 11/21/2019 11:00 AM   GFRAA 76 09/09/2017 09:59 AM    BMP Latest Ref Rng & Units 08/19/2020 11/21/2019 07/24/2019  Glucose 70 - 99 mg/dL 108(H) 94 97  BUN 6 - 23 mg/dL 25(H) 26 23  Creatinine 0.40 - 1.20 mg/dL 0.88 0.94 0.98  BUN/Creat Ratio 12 - 28 - 28 23  Sodium 135 - 145 mEq/L 134(L) 135 136  Potassium 3.5 - 5.1 mEq/L 4.6 4.6 4.5  Chloride 96 - 112 mEq/L 105 100 102  CO2 19 - 32 mEq/L 22 23 20   Calcium 8.4 - 10.5 mg/dL 9.0 9.4 9.0    Current antihypertensive regimen:  Propranolol 40 mg 1 tablet twice daily How often are  you checking your Blood Pressure?  Current home BP readings: What recent interventions/DTPs have been made by any provider to improve Blood Pressure control since last CPP Visit: None.  Any recent hospitalizations or ED visits since last visit with CPP? No What diet changes have been made to improve Blood Pressure Control?   What exercise is being done to improve your Blood Pressure Control?    Adherence Review: Is the patient currently on ACE/ARB medication? No Does the patient have >5 day gap between last estimated fill dates? No  Multiple unsuccessful attempts to reach patient by phone.   Star Rating Drugs:  Atorvastatin 10mg  - last filled on 12/22/20 90DS at [facility] Pharmacist Assistant [phone]

## 2021-01-13 NOTE — Telephone Encounter (Cosign Needed)
2nd attempt

## 2021-01-14 NOTE — Telephone Encounter (Cosign Needed)
3rd attempt

## 2021-01-19 ENCOUNTER — Telehealth: Payer: Self-pay | Admitting: Family Medicine

## 2021-01-19 NOTE — Telephone Encounter (Signed)
Pt call and stated she don't want a appt. 

## 2021-01-19 NOTE — Telephone Encounter (Signed)
Documented on spreadsheet 

## 2021-01-22 ENCOUNTER — Encounter: Payer: Self-pay | Admitting: Podiatry

## 2021-01-22 ENCOUNTER — Other Ambulatory Visit: Payer: Self-pay

## 2021-01-22 DIAGNOSIS — M778 Other enthesopathies, not elsewhere classified: Secondary | ICD-10-CM

## 2021-01-28 ENCOUNTER — Encounter: Payer: Self-pay | Admitting: Podiatry

## 2021-01-28 ENCOUNTER — Ambulatory Visit (INDEPENDENT_AMBULATORY_CARE_PROVIDER_SITE_OTHER): Payer: PPO

## 2021-01-28 ENCOUNTER — Ambulatory Visit: Payer: PPO | Admitting: Podiatry

## 2021-01-28 ENCOUNTER — Other Ambulatory Visit: Payer: Self-pay

## 2021-01-28 DIAGNOSIS — M778 Other enthesopathies, not elsewhere classified: Secondary | ICD-10-CM

## 2021-01-28 NOTE — Progress Notes (Signed)
She presents today for her first surgical consult regarding follow-up of a tear of the capsule right second toe and metatarsal phalangeal joint.  States that she is also having pain beneath the third metatarsal.  She states that and is ready to have surgery get this thing up with it hurts that bad.  She denies any changes in her past medical history medications allergies surgeries and social history.  We reviewed her allergies today.  Objective: Vital signs are stable she is alert oriented x3 pulses are palpable.  There is no erythema to some mild edema no cellulitis drainage or odor though she does have pain on palpation and range of motion of the second and third toes of the right foot.  Radiographs taken today demonstrate elongated second and third metatarsals.  Assessment MRI indicates a tear of the lateral capsule of the right second metatarsophalangeal joint.  Capsulitis of the second and third metatarsophalangeal joints.  Plan: At this point requesting surgical consult for a second and third met osteotomy with screw fixation.  Answer all the questions regarding these procedure to best my ability layman's terms.  She understood and was amenable to it.  Signed all 3 pages consent form.  We did discuss possible postop complications which may include but not limited to postop pain bleeding swelling infection recurrence need for further surgery overcorrection under correction loss of digit loss of limb loss of life.  Provided her with information regarding the surgery center anesthesia group.  She already has a cam walker at home which she will utilize that 1.

## 2021-02-06 ENCOUNTER — Other Ambulatory Visit: Payer: Self-pay | Admitting: Family Medicine

## 2021-02-06 DIAGNOSIS — G4700 Insomnia, unspecified: Secondary | ICD-10-CM

## 2021-02-06 NOTE — Telephone Encounter (Signed)
Last office visit- 08/14/20 Last refill- 08/14/20-45 tabs, 3 refills  Next office visit- 02/16/21 Can this patient receive a refill?

## 2021-02-11 ENCOUNTER — Ambulatory Visit: Payer: PPO | Admitting: Physical Therapy

## 2021-02-12 ENCOUNTER — Telehealth: Payer: Self-pay | Admitting: Urology

## 2021-02-12 NOTE — Telephone Encounter (Signed)
DOS - 03/06/21  METATARSAL OSTEOTOMY 2,3 RIGHT --- 38184   [employer] EFFECTIVE DATE - 08/09/2018   RECEIVED FAX FROM [employer] STATING CPT CODE 03754 X'S 2 HAS BEEN APPROVED, AUTH # [id number], GOOD FROM 03/06/21 - 06/04/21.

## 2021-02-16 ENCOUNTER — Ambulatory Visit (INDEPENDENT_AMBULATORY_CARE_PROVIDER_SITE_OTHER): Payer: PPO | Admitting: Family Medicine

## 2021-02-16 ENCOUNTER — Encounter: Payer: Self-pay | Admitting: Family Medicine

## 2021-02-16 ENCOUNTER — Other Ambulatory Visit: Payer: Self-pay

## 2021-02-16 VITALS — BP 128/70 | HR 82 | Resp 16 | Ht 62.0 in | Wt 178.5 lb

## 2021-02-16 DIAGNOSIS — E038 Other specified hypothyroidism: Secondary | ICD-10-CM

## 2021-02-16 DIAGNOSIS — R42 Dizziness and giddiness: Secondary | ICD-10-CM

## 2021-02-16 DIAGNOSIS — I10 Essential (primary) hypertension: Secondary | ICD-10-CM

## 2021-02-16 DIAGNOSIS — E7849 Other hyperlipidemia: Secondary | ICD-10-CM | POA: Diagnosis not present

## 2021-02-16 DIAGNOSIS — F418 Other specified anxiety disorders: Secondary | ICD-10-CM | POA: Diagnosis not present

## 2021-02-16 DIAGNOSIS — G4709 Other insomnia: Secondary | ICD-10-CM

## 2021-02-16 DIAGNOSIS — R7303 Prediabetes: Secondary | ICD-10-CM

## 2021-02-16 DIAGNOSIS — E041 Nontoxic single thyroid nodule: Secondary | ICD-10-CM

## 2021-02-16 DIAGNOSIS — E785 Hyperlipidemia, unspecified: Secondary | ICD-10-CM | POA: Diagnosis not present

## 2021-02-16 LAB — COMPREHENSIVE METABOLIC PANEL
ALT: 27 U/L (ref 0–35)
AST: 22 U/L (ref 0–37)
Albumin: 4 g/dL (ref 3.5–5.2)
Alkaline Phosphatase: 67 U/L (ref 39–117)
BUN: 17 mg/dL (ref 6–23)
CO2: 26 mEq/L (ref 19–32)
Calcium: 9.1 mg/dL (ref 8.4–10.5)
Chloride: 102 mEq/L (ref 96–112)
Creatinine, Ser: 0.96 mg/dL (ref 0.40–1.20)
GFR: 59.86 mL/min — ABNORMAL LOW (ref >60.00)
Glucose, Bld: 96 mg/dL (ref 70–99)
Potassium: 4.4 mEq/L (ref 3.5–5.1)
Sodium: 136 mEq/L (ref 135–145)
Total Bilirubin: 0.6 mg/dL (ref 0.2–1.2)
Total Protein: 6.4 g/dL (ref 6.0–8.3)

## 2021-02-16 LAB — HEMOGLOBIN A1C: Hgb A1c MFr Bld: 5.8 % (ref 4.6–6.5)

## 2021-02-16 LAB — LIPID PANEL
Cholesterol: 180 mg/dL (ref 0–200)
HDL: 47.2 mg/dL (ref >39.00)
LDL Cholesterol: 110 mg/dL — ABNORMAL HIGH (ref 0–99)
NonHDL: 132.9
Total CHOL/HDL Ratio: 4
Triglycerides: 113 mg/dL (ref 0.0–149.0)
VLDL: 22.6 mg/dL (ref 0.0–40.0)

## 2021-02-16 LAB — TSH: TSH: 1.88 u[IU]/mL (ref 0.35–5.50)

## 2021-02-16 NOTE — Progress Notes (Signed)
HPI: [name] is a 70 y.o. female, who is here today for 6 months follow up.   She was last seen on 08/19/20. Since her last visit she has followed with ortho for lower back pain radiated to right hip and LE. Tingling and numbness. Sometimes pain interferes with sleep.  Anxiety: Currently she is on paroxetine 10 mg daily and alprazolam 0.25 mg daily as needed. Alprazolam last filled on 12/20/2020. She takes Alprazolam every night and sometimes during the day if anxious and she is going to be home.  Depression screen [facility] 2/9 02/16/2021 08/19/2020 12/22/2019 03/26/2019 12/20/2018  Decreased Interest 3 0 0 1 0  Down, Depressed, Hopeless 3 0 0 2 0  PHQ - 2 Score 6 0 0 3 0  Altered sleeping 1 0 1 0 -  Tired, decreased energy 3 1 1 3  -  Change in appetite 1 0 0 2 -  Feeling bad or failure about yourself  0 0 0 2 -  Trouble concentrating 0 0 0 1 -  Moving slowly or fidgety/restless 0 0 0 1 -  Suicidal thoughts 0 0 0 0 -  PHQ-9 Score 11 1 2 12  -  Difficult doing work/chores Somewhat difficult Not difficult at all Not difficult at all Somewhat difficult -   GAD 7 : Generalized Anxiety Score 02/16/2021 08/19/2020 12/22/2019  Nervous, Anxious, on Edge 2 1 1   Control/stop worrying 1 1 1   Worry too much - different things 2 1 1   Trouble relaxing 0 1 1  Restless 0 0 0  Easily annoyed or irritable 1 1 1   Afraid - awful might happen 0 0 0  Total GAD 7 Score 6 5 5   Anxiety Difficulty Somewhat difficult Somewhat difficult Somewhat difficult   Hypothyroidism: Currently she is on Levothyroxine 25 mcg daily. Thyroid nodules, thyroid US in 07/2018: 1. Normal-sized thyroid with stable bilateral nodules. None meets criteria for biopsy. 2. Recommend annual/biennial ultrasound follow-up as above, until stability x5 years confirmed. She has not noted growth or dysphagia.  Essential tremor: Stable, Propranolol has helped. She has been evaluated by neurologist.  Lab Results  Component Value Date    TSH 2.190 11/21/2019   She is on Flexeril 5 mg at bedtime but trying not to take it daily. It helps with back cramps. Follows with Dr Nelva Bush for lower back pain with radiculopathy.  She has not been exercising because right foot pain, planning on having right foot surgery.  Prediabetes, HgA1C in 11/2019 was 5.5. Negative for polydipsia,polyuria, or polyphagia.  HTN: She is on Propranolol 40 mg bid. Negative for severe/frequent headache, visual changes, chest pain, dyspnea, palpitation, focal weakness, or edema.  Having intermittent episodes of dizziness, movement sensation,sometimes in bed and usually in the morning, from 10 am to 11 am. Hx of vertigo. Dizzy episodes have not been as bad as her vertigo used to be. She has taken Meclizine before for vertigo.  Brain MRI in 06/2018:  1. No acute or reversible finding. 2. Mild chronic small vessel ischemia.  HLD: She is on Atorvastatin 10 mg daily.  Component     Latest Ref Rng & Units 11/21/2019  Cholesterol, Total     100 - 199 mg/dL 189  Triglycerides     0.0 - 149.0 mg/dL 87  HDL Cholesterol     >39.00 mg/dL 52  VLDL Cholesterol Cal     5 - 40 mg/dL 16  LDL Chol Calc (NIH)     0 - 99  mg/dL 121 (H)  LDL/HDL Ratio     0.0 - 3.2 ratio 2.3    Review of Systems  Constitutional:  Positive for fatigue. Negative for activity change, appetite change and fever.  HENT:  Negative for mouth sores, nosebleeds and sore throat.   Respiratory:  Negative for cough and wheezing.   Gastrointestinal:  Negative for abdominal pain, nausea and vomiting.       Negative for changes in bowel habits.  Endocrine: Negative for cold intolerance and heat intolerance.  Genitourinary:  Negative for decreased urine volume and hematuria.  Musculoskeletal:  Positive for back pain.  Skin:  Negative for pallor and rash.  Neurological:  Negative for syncope, facial asymmetry and weakness.  Psychiatric/Behavioral:  Negative for confusion and  hallucinations.   Rest of ROS, see pertinent positives sand negatives in HPI  Current Outpatient Medications on File Prior to Visit  Medication Sig Dispense Refill   Acetaminophen (TYLENOL PO) Take 500 mg by mouth 2 (two) times daily.     albuterol (PROVENTIL HFA;VENTOLIN HFA) 108 (90 Base) MCG/ACT inhaler Inhale 2 puffs into the lungs every 6 (six) hours as needed for wheezing or shortness of breath. 1 Inhaler 2   atorvastatin (LIPITOR) 10 MG tablet Take 1 tablet (10 mg total) by mouth daily. 90 tablet 2   cyclobenzaprine (FLEXERIL) 5 MG tablet TAKE 1 TABLET BY MOUTH AT BEDTIME 90 tablet 0   estradiol (VIVELLE-DOT) 0.0375 MG/24HR APPLY 1 PATCH TOPICALLY TWICE A WEEK  8   Magnesium 250 MG TABS Take by mouth daily.     meclizine (ANTIVERT) 12.5 MG tablet Take 1 tablet (12.5 mg total) by mouth 3 (three) times daily as needed for dizziness. 60 tablet 1   Multiple Vitamin (MULTIVITAMIN) tablet Take 1 tablet by mouth daily.     pantoprazole (PROTONIX) 40 MG tablet Take 1 tablet (40 mg total) by mouth daily. 90 tablet 2   PARoxetine (PAXIL) 10 MG tablet Take 1 tablet (10 mg total) by mouth daily. 90 tablet 1   propranolol (INDERAL) 40 MG tablet Take 1 tablet (40 mg total) by mouth 2 (two) times daily. 180 tablet 2   RESTASIS 0.05 % ophthalmic emulsion Place into both eyes 2 (two) times daily.     SYNTHROID 25 MCG tablet Take 1 tablet (25 mcg total) by mouth daily before breakfast. 90 tablet 2   VOLTAREN 1 % GEL SMARTSIG:1 Sparingly Topical PRN     ALPRAZolam (XANAX) 0.25 MG tablet Take 1 tablet by mouth twice daily as needed for anxiety 45 tablet 2   No current facility-administered medications on file prior to visit.   Past Medical History:  Diagnosis Date   Actinic keratosis    Anxiety    Arthritis    Back pain    Cancer (HCC)    basal and squamous cell skin   Chicken pox    Chronic back pain    COPD (chronic obstructive pulmonary disease) (HCC)    Depression    Gallbladder problem     GERD (gastroesophageal reflux disease)    Hypertension    Hypothyroidism    Joint pain    Lactose intolerance    Migraines    Osteoarthritis    SOB (shortness of breath)    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, Mohs   Thyroid disease    Tremor    Vitamin D deficiency    Allergies  Allergen Reactions   Hydrocodone Nausea Only    Per patient  Meloxicam Other (See Comments)    "skin infection on leg"   Sulfa Antibiotics Rash    Rash all over body/fever    Social History   Socioeconomic History   Marital status: Single    Spouse name: Not on file   Number of children: 0   Years of education: 12   Highest education level: Not on file  Occupational History   Occupation: retired    Comment: administration  Tobacco Use   Smoking status: Former    Packs/day: 1.50    Years: 25.00    Pack years: 37.50    Types: Cigarettes    Quit date: 03/03/1990    Years since quitting: 30.9   Smokeless tobacco: Never  Vaping Use   Vaping Use: Never used  Substance and Sexual Activity   Alcohol use: Yes    Comment: glass of wine once a month   Drug use: No   Sexual activity: Never  Other Topics Concern   Not on file  Social History Narrative   Lives alone   Caffeine- tea, 1 cup   Social Determinants of Health   Financial Resource Strain: Low Risk    Difficulty of Paying Living Expenses: Not hard at all  Food Insecurity: Not on file  Transportation Needs: Not on file  Physical Activity: Not on file  Stress: Not on file  Social Connections: Not on file   Vitals:   02/16/21 0952  BP: 128/70  Pulse: 82  Resp: 16  SpO2: 98%   Body mass index is 32.65 kg/m.  Physical Exam Vitals and nursing note reviewed.  Constitutional:      General: She is not in acute distress.    Appearance: She is well-developed.  HENT:     Head: Normocephalic and atraumatic.     Mouth/Throat:     Mouth: Mucous membranes are moist.     Pharynx: Oropharynx is clear.  Eyes:      Conjunctiva/sclera: Conjunctivae normal.  Cardiovascular:     Rate and Rhythm: Normal rate and regular rhythm.     Pulses:          Dorsalis pedis pulses are 2+ on the left side.     Heart sounds: No murmur heard. Pulmonary:     Effort: Pulmonary effort is normal. No respiratory distress.     Breath sounds: Normal breath sounds.  Abdominal:     Palpations: Abdomen is soft. There is no hepatomegaly or mass.     Tenderness: There is no abdominal tenderness.  Musculoskeletal:     Comments: Right foot with surgical shoe.  Lymphadenopathy:     Cervical: No cervical adenopathy.  Skin:    General: Skin is warm.     Findings: No erythema or rash.  Neurological:     General: No focal deficit present.     Mental Status: She is alert and oriented to person, place, and time.     Cranial Nerves: No cranial nerve deficit.     Gait: Gait normal.  Psychiatric:        Mood and Affect: Mood is anxious.     Comments: Well groomed, good eye contact.    ASSESSMENT AND PLAN:  Ms. Brian Kocourek was seen today for 6 months follow-up.  Orders Placed This Encounter  Procedures   US THYROID   TSH   Hemoglobin A1c   Lipid panel   Comprehensive metabolic panel   Lab Results  Component Value Date   CHOL 180 02/16/2021  HDL 47.20 02/16/2021   LDLCALC 110 (H) 02/16/2021   TRIG 113.0 02/16/2021   CHOLHDL 4 02/16/2021   Lab Results  Component Value Date   HGBA1C 5.8 02/16/2021   Lab Results  Component Value Date   TSH 1.88 02/16/2021   Lab Results  Component Value Date   CREATININE 0.96 02/16/2021   BUN 17 02/16/2021   NA 136 02/16/2021   K 4.4 02/16/2021   CL 102 02/16/2021   CO2 26 02/16/2021   Lab Results  Component Value Date   ALT 27 02/16/2021   AST 22 02/16/2021   ALKPHOS 67 02/16/2021   BILITOT 0.6 02/16/2021   Other insomnia Stable. Continue Alprazolam 0.25 mg at bedtime. Good sleep hygiene.  Essential hypertension BP adequately controlled. Continue Propranolol  40 mg bid. Low salt diet. Monitor BP at home.  Other specified anxiety disorders Stable. Continue Paroxetine 10 mg daily and Alprazolam, 0.25 mg bid prn.  Other specified hypothyroidism Stable. Continue Levothyroxine 25 mg daily. F/U in 12 months.  Prediabetes Consistently with a healthy life style recommended for diabetes prevention.  Thyroid nodule Will schedule thyroid US. Further recommendations according to TSH and imaging result.  Hyperlipidemia, unspecified hyperlipidemia type Continue Atorvastatin 10 mg daily and low fat diet.  Dizziness We discussed possible causes. It could be vertigo. Hx and examination do not suggest a serious process. Monitor for new symptoms. Fall precautions discussed.   Return in about 6 months (around 08/19/2021).   Arlen Legendre G. Martinique, MD  [facility]. [place] office.

## 2021-02-16 NOTE — Patient Instructions (Addendum)
A few things to remember from today's visit:   Essential hypertension - Plan: Comprehensive metabolic panel  Other insomnia  Other specified anxiety disorders  Other specified hypothyroidism - Plan: TSH  Prediabetes - Plan: Hemoglobin A1c  Thyroid nodule - Plan: US THYROID  Hyperlipidemia, unspecified hyperlipidemia type - Plan: Lipid panel, Comprehensive metabolic panel  Dizziness  No changes today. Dizziness reported today sounds like vertigo. Continue monitoring for changes, Meclizine as needed and fall precautions.  If you need refills please call your pharmacy. Do not use My Chart to request refills or for acute issues that need immediate attention.    Please be sure medication list is accurate. If a new problem present, please set up appointment sooner than planned today.

## 2021-02-17 ENCOUNTER — Other Ambulatory Visit: Payer: Self-pay | Admitting: Family Medicine

## 2021-02-17 ENCOUNTER — Encounter: Payer: PPO | Admitting: Physical Therapy

## 2021-02-17 DIAGNOSIS — Z1231 Encounter for screening mammogram for malignant neoplasm of breast: Secondary | ICD-10-CM

## 2021-02-19 ENCOUNTER — Encounter: Payer: PPO | Admitting: Physical Therapy

## 2021-02-19 MED ORDER — ATORVASTATIN CALCIUM 10 MG PO TABS: 10.0000 mg | ORAL_TABLET | Freq: Every day | ORAL | 3 refills | Status: DC

## 2021-02-24 ENCOUNTER — Other Ambulatory Visit: Payer: Self-pay

## 2021-02-24 ENCOUNTER — Encounter: Payer: PPO | Admitting: Physical Therapy

## 2021-02-24 DIAGNOSIS — N951 Menopausal and female climacteric states: Secondary | ICD-10-CM

## 2021-02-24 DIAGNOSIS — F418 Other specified anxiety disorders: Secondary | ICD-10-CM

## 2021-02-24 MED ORDER — PAROXETINE HCL 10 MG PO TABS: 10.0000 mg | ORAL_TABLET | Freq: Every day | ORAL | 1 refills | Status: DC

## 2021-02-25 ENCOUNTER — Ambulatory Visit
Admission: RE | Admit: 2021-02-25 | Discharge: 2021-02-25 | Disposition: A | Discharge status: Home / Self Care | Payer: PPO | Source: Ambulatory Visit | Attending: Family Medicine | Admitting: Family Medicine

## 2021-02-25 DIAGNOSIS — E041 Nontoxic single thyroid nodule: Secondary | ICD-10-CM

## 2021-02-26 ENCOUNTER — Encounter: Payer: PPO | Admitting: Physical Therapy

## 2021-02-26 ENCOUNTER — Encounter: Payer: Self-pay | Admitting: Podiatry

## 2021-03-03 ENCOUNTER — Encounter: Payer: PPO | Admitting: Physical Therapy

## 2021-03-04 ENCOUNTER — Other Ambulatory Visit: Payer: Self-pay | Admitting: Podiatry

## 2021-03-04 ENCOUNTER — Ambulatory Visit: Payer: PPO | Admitting: Podiatry

## 2021-03-04 MED ORDER — CEPHALEXIN 500 MG PO CAPS: 500.0000 mg | ORAL_CAPSULE | Freq: Three times a day (TID) | ORAL | 0 refills | Status: DC

## 2021-03-04 MED ORDER — OXYCODONE-ACETAMINOPHEN 10-325 MG PO TABS
1.0000 | ORAL_TABLET | Freq: Three times a day (TID) | ORAL | 0 refills | Status: AC | PRN
Start: 2021-03-04 — End: 2021-03-11

## 2021-03-04 MED ORDER — ONDANSETRON HCL 4 MG PO TABS: 4.0000 mg | ORAL_TABLET | Freq: Three times a day (TID) | ORAL | 0 refills | Status: DC | PRN

## 2021-03-05 ENCOUNTER — Encounter: Payer: PPO | Admitting: Physical Therapy

## 2021-03-06 DIAGNOSIS — M778 Other enthesopathies, not elsewhere classified: Secondary | ICD-10-CM | POA: Diagnosis not present

## 2021-03-06 DIAGNOSIS — M216X1 Other acquired deformities of right foot: Secondary | ICD-10-CM | POA: Diagnosis not present

## 2021-03-06 DIAGNOSIS — M25571 Pain in right ankle and joints of right foot: Secondary | ICD-10-CM | POA: Diagnosis not present

## 2021-03-06 DIAGNOSIS — M21541 Acquired clubfoot, right foot: Secondary | ICD-10-CM | POA: Diagnosis not present

## 2021-03-09 ENCOUNTER — Ambulatory Visit: Payer: PPO | Admitting: Dermatology

## 2021-03-11 ENCOUNTER — Ambulatory Visit (INDEPENDENT_AMBULATORY_CARE_PROVIDER_SITE_OTHER): Payer: PPO | Admitting: Podiatry

## 2021-03-11 ENCOUNTER — Encounter: Payer: Self-pay | Admitting: Podiatry

## 2021-03-11 ENCOUNTER — Other Ambulatory Visit: Payer: Self-pay

## 2021-03-11 ENCOUNTER — Ambulatory Visit (INDEPENDENT_AMBULATORY_CARE_PROVIDER_SITE_OTHER): Payer: PPO

## 2021-03-11 DIAGNOSIS — Z9889 Other specified postprocedural states: Secondary | ICD-10-CM

## 2021-03-11 DIAGNOSIS — M778 Other enthesopathies, not elsewhere classified: Secondary | ICD-10-CM | POA: Diagnosis not present

## 2021-03-11 NOTE — Progress Notes (Signed)
She presents today for postop visit date of surgery 03/06/2021 right foot metatarsal osteotomy #2 #3 she states that it felt really good I have not had any pain meds at all.  She was nauseous after surgery with emesis.  She denies fever chills nausea vomiting muscle aches pains calf pain back pain chest pain shortness of breath.  She has been taking her antibiotics.  Objective: Vital signs are stable she is alert and oriented x3.  Pulses are palpable.  Dry sterile dressing intact once removed demonstrates no erythema to some mild edema no cellulitis drainage or odor sutures are intact margins are well coapted radiographs taken today demonstrate capital osteotomy is in good position and internal fixation is intact.  Assessment: Well-healing and well positioned capital osteotomies #2 #3 of the right foot.  Plan: Redressed today with a dry sterile compressive dressing follow-up with her in 1 week for possible suture removal.

## 2021-03-18 ENCOUNTER — Ambulatory Visit (INDEPENDENT_AMBULATORY_CARE_PROVIDER_SITE_OTHER): Payer: PPO | Admitting: Podiatry

## 2021-03-18 ENCOUNTER — Other Ambulatory Visit: Payer: Self-pay

## 2021-03-18 ENCOUNTER — Encounter: Payer: Self-pay | Admitting: Podiatry

## 2021-03-18 DIAGNOSIS — M778 Other enthesopathies, not elsewhere classified: Secondary | ICD-10-CM

## 2021-03-18 DIAGNOSIS — Z9889 Other specified postprocedural states: Secondary | ICD-10-CM

## 2021-03-18 NOTE — Progress Notes (Signed)
She presents today for follow-up of her second and third metatarsal osteotomies of her right foot.  She states has been feeling pretty good not a lot of significant pain.  Denies fever chills nausea vomit muscle aches pains denies shortness of breath or chest pain.  Objective: Vital signs are stable oriented x3 there is no erythema to some mild edema no cellulitis drainage or odor sutures are intact I removed a few of the sutures today I saw some gapping of the incision site so I think we will leave the rest of the sutures in.  Assessment: Well-healing surgical foot.  Plan: Follow-up with her in 1 week for suture removal then follow-up with her 2 weeks after that

## 2021-03-20 ENCOUNTER — Telehealth: Payer: PPO

## 2021-03-25 ENCOUNTER — Ambulatory Visit (INDEPENDENT_AMBULATORY_CARE_PROVIDER_SITE_OTHER): Payer: PPO | Admitting: Podiatry

## 2021-03-25 ENCOUNTER — Other Ambulatory Visit: Payer: Self-pay

## 2021-03-25 ENCOUNTER — Encounter: Payer: Self-pay | Admitting: Podiatry

## 2021-03-25 DIAGNOSIS — S99921D Unspecified injury of right foot, subsequent encounter: Secondary | ICD-10-CM | POA: Diagnosis not present

## 2021-03-25 DIAGNOSIS — Z9889 Other specified postprocedural states: Secondary | ICD-10-CM

## 2021-03-25 NOTE — Progress Notes (Signed)
She presents today for a postop visit date of surgery 729 2022-second and third metatarsal osteotomies states that his not feeling too much different.  Objective: Sutures are intact margins well coapted there is considerable edema which has been up on her foot.  Mild tenderness on palpation.  Assessment well-healing surgical foot moderate edema most likely secondary to noncompliance.  Plan: Removal of sutures today placed her compression sock and back in her Darco shoe I would like to follow-up with her in 2 weeks for reevaluation x-rays will be taken at that time.

## 2021-03-30 ENCOUNTER — Ambulatory Visit: Payer: PPO | Admitting: Dermatology

## 2021-03-30 ENCOUNTER — Ambulatory Visit
Admission: RE | Admit: 2021-03-30 | Discharge: 2021-03-30 | Disposition: A | Discharge status: Home / Self Care | Payer: PPO | Source: Ambulatory Visit | Attending: Family Medicine | Admitting: Family Medicine

## 2021-03-30 ENCOUNTER — Other Ambulatory Visit: Payer: Self-pay

## 2021-03-30 DIAGNOSIS — I781 Nevus, non-neoplastic: Secondary | ICD-10-CM

## 2021-03-30 DIAGNOSIS — L578 Other skin changes due to chronic exposure to nonionizing radiation: Secondary | ICD-10-CM | POA: Diagnosis not present

## 2021-03-30 DIAGNOSIS — L820 Inflamed seborrheic keratosis: Secondary | ICD-10-CM | POA: Diagnosis not present

## 2021-03-30 DIAGNOSIS — Z1231 Encounter for screening mammogram for malignant neoplasm of breast: Secondary | ICD-10-CM | POA: Diagnosis not present

## 2021-03-30 DIAGNOSIS — L570 Actinic keratosis: Secondary | ICD-10-CM | POA: Diagnosis not present

## 2021-03-30 NOTE — Progress Notes (Signed)
Follow-Up Visit   Subjective  Kristina [name] is a 70 y.o. female who presents for the following: Follow-up (Patient presents for 6 month follow-up Aks. She used Carac cream to her face after last visit, but didn't get much reaction. Rx was several years old. She has a few scaly spots to check today on the face and chest. ). She tends to pick at them. She also has a history of spot that comes and goes on the right neck. Rash gets red, itchy, not scaly but is always in the same spot.   The following portions of the chart were reviewed this encounter and updated as appropriate:       Review of Systems:  No other skin or systemic complaints except as noted in HPI or Assessment and Plan.  Objective  Well appearing patient in no apparent distress; mood and affect are within normal limits.  A focused examination was performed including face, chest, arms. Relevant physical exam findings are noted in the Assessment and Plan.  Left mid jaw (residual) x 1, left mid forearm x 1, right upper chest x 3, right lower mandible x 1, R post auricular hairline x 1 (7) Erythematous keratotic or waxy stuck-on papule  Right Upper Temple x 1 Pink scaly macule.   Assessment & Plan  Actinic Damage - chronic, secondary to cumulative UV radiation exposure/sun exposure over time - diffuse scaly erythematous macules with underlying dyspigmentation - Recommend daily broad spectrum sunscreen SPF 30+ to sun-exposed areas, reapply every 2 hours as needed.  - Recommend staying in the shade or wearing long sleeves, sun glasses (UVA+UVB protection) and wide brim hats (4-inch brim around the entire circumference of the hat). - Call for new or changing lesions.  Telangiectasia - Dilated blood vessel of the right malar cheek - Benign appearing on exam - Call for changes  Inflamed seborrheic keratosis Left mid jaw (residual) x 1, left mid forearm x 1, right upper chest x 3, right lower mandible x 1, R post auricular  hairline x 1  Recheck right lower mandible and left mid jaw on follow-up.  Destruction of lesion - Left mid jaw (residual) x 1, left mid forearm x 1, right upper chest x 3, right lower mandible x 1, R post auricular hairline x 1  Destruction method: cryotherapy   Informed consent: discussed and consent obtained   Lesion destroyed using liquid nitrogen: Yes   Region frozen until ice ball extended beyond lesion: Yes   Outcome: patient tolerated procedure well with no complications   Post-procedure details: wound care instructions given   Additional details:  Prior to procedure, discussed risks of blister formation, small wound, skin dyspigmentation, or rare scar following cryotherapy. Recommend Vaseline ointment to treated areas while healing.   AK (actinic keratosis) Right Upper Temple x 1  Actinic keratoses are precancerous spots that appear secondary to cumulative UV radiation exposure/sun exposure over time. They are chronic with expected duration over 1 year. A portion of actinic keratoses will progress to squamous cell carcinoma of the skin. It is not possible to reliably predict which spots will progress to skin cancer and so treatment is recommended to prevent development of skin cancer.  Recommend daily broad spectrum sunscreen SPF 30+ to sun-exposed areas, reapply every 2 hours as needed.  Recommend staying in the shade or wearing long sleeves, sun glasses (UVA+UVB protection) and wide brim hats (4-inch brim around the entire circumference of the hat). Call for new or changing lesions.  Destruction of  lesion - Right Upper Temple x 1  Destruction method: cryotherapy   Informed consent: discussed and consent obtained   Lesion destroyed using liquid nitrogen: Yes   Region frozen until ice ball extended beyond lesion: Yes   Outcome: patient tolerated procedure well with no complications   Post-procedure details: wound care instructions given   Additional details:  Prior to  procedure, discussed risks of blister formation, small wound, skin dyspigmentation, or rare scar following cryotherapy. Recommend Vaseline ointment to treated areas while healing.   Return in about 6 months (around 09/30/2021) for TBSE. Recheck right lower mandible and left mid jaw.Lindi Adie, CMA, am acting as scribe for [name], MD . Documentation: I have reviewed the above documentation for accuracy and completeness, and I agree with the above.  [name] MD

## 2021-03-30 NOTE — Patient Instructions (Signed)

## 2021-04-01 ENCOUNTER — Encounter: Payer: PPO | Admitting: Podiatry

## 2021-04-08 ENCOUNTER — Other Ambulatory Visit: Payer: Self-pay

## 2021-04-08 ENCOUNTER — Ambulatory Visit (INDEPENDENT_AMBULATORY_CARE_PROVIDER_SITE_OTHER): Payer: PPO

## 2021-04-08 ENCOUNTER — Encounter: Payer: Self-pay | Admitting: Podiatry

## 2021-04-08 ENCOUNTER — Ambulatory Visit (INDEPENDENT_AMBULATORY_CARE_PROVIDER_SITE_OTHER): Payer: PPO | Admitting: Podiatry

## 2021-04-08 DIAGNOSIS — Z9889 Other specified postprocedural states: Secondary | ICD-10-CM

## 2021-04-08 DIAGNOSIS — S99921D Unspecified injury of right foot, subsequent encounter: Secondary | ICD-10-CM

## 2021-04-08 DIAGNOSIS — M778 Other enthesopathies, not elsewhere classified: Secondary | ICD-10-CM | POA: Diagnosis not present

## 2021-04-08 DIAGNOSIS — M7751 Other enthesopathy of right foot: Secondary | ICD-10-CM | POA: Diagnosis not present

## 2021-04-08 MED ORDER — TRIAMCINOLONE ACETONIDE 40 MG/ML IJ SUSP
20.0000 mg | Freq: Once | INTRAMUSCULAR | Status: AC
  Administered 2021-04-08: 20 mg

## 2021-04-08 NOTE — Progress Notes (Unsigned)
Rt

## 2021-04-08 NOTE — Progress Notes (Signed)
She presents today for follow-up of her second and third metatarsal osteotomy states that that is doing just fine at the outside of my ankle is starting to hurt if she refers to a sinus tarsi area.  Date of surgery March 06, 2021.  Objective: Vital signs are stable alert oriented x3 surgical site appears to be healing very nicely mild edema no erythema cellulitis drainage or odor and the toes are sitting rectus.  She does have some tenderness with edema on palpation of the peroneal retinaculum as well as pain on palpation of the sinus tarsi.  Radiographs taken today demonstrate osseously mature individual with internal fixation second and third metatarsals intact and healing well as well as a Keller arthroplasty with a single silicone implant.  No other other abnormalities of the midfoot or ankle are noted.  Assessment: Peroneal tendinitis or subtalar joint capsulitis.  Plan: At this point I injected the subtalar joint today with 10 mg Kenalog 5 mg Marcaine.  I like to follow-up with her in 3 to 4 weeks.  Another set of x-rays will be necessary

## 2021-04-15 ENCOUNTER — Encounter: Payer: PPO | Admitting: Podiatry

## 2021-04-21 ENCOUNTER — Telehealth: Payer: Self-pay | Admitting: Family Medicine

## 2021-04-21 NOTE — Telephone Encounter (Signed)
Patient returned my call.  She declined stated she just wants to see dr Martinique

## 2021-04-21 NOTE — Telephone Encounter (Signed)
Left message for patient to call back and schedule Medicare Annual Wellness Visit (AWV) either virtually or in office. Left  my Herbie Drape number [phone]   Last AWV ;12/20/2018  please schedule at anytime with [facility] Nurse Health Advisor 1 or 2   This should be a 45 minute visit.

## 2021-05-04 ENCOUNTER — Other Ambulatory Visit: Payer: Self-pay

## 2021-05-04 DIAGNOSIS — I10 Essential (primary) hypertension: Secondary | ICD-10-CM

## 2021-05-04 DIAGNOSIS — K219 Gastro-esophageal reflux disease without esophagitis: Secondary | ICD-10-CM

## 2021-05-04 MED ORDER — PROPRANOLOL HCL 40 MG PO TABS: 40.0000 mg | ORAL_TABLET | Freq: Two times a day (BID) | ORAL | 2 refills | Status: DC

## 2021-05-04 MED ORDER — PANTOPRAZOLE SODIUM 40 MG PO TBEC: 40.0000 mg | DELAYED_RELEASE_TABLET | Freq: Every day | ORAL | 2 refills | Status: DC

## 2021-05-13 ENCOUNTER — Other Ambulatory Visit: Payer: Self-pay

## 2021-05-13 ENCOUNTER — Ambulatory Visit (INDEPENDENT_AMBULATORY_CARE_PROVIDER_SITE_OTHER): Payer: PPO | Admitting: Podiatry

## 2021-05-13 ENCOUNTER — Ambulatory Visit (INDEPENDENT_AMBULATORY_CARE_PROVIDER_SITE_OTHER): Payer: PPO

## 2021-05-13 DIAGNOSIS — S99921D Unspecified injury of right foot, subsequent encounter: Secondary | ICD-10-CM | POA: Diagnosis not present

## 2021-05-13 DIAGNOSIS — Z9889 Other specified postprocedural states: Secondary | ICD-10-CM

## 2021-05-13 DIAGNOSIS — M7751 Other enthesopathy of right foot: Secondary | ICD-10-CM

## 2021-05-13 NOTE — Progress Notes (Signed)
She presents today for postop visit she denies fever chills nausea vomiting muscle aches and pains date of surgery 03/06/2021 to the second and third metatarsal osteotomies.  Objective: Vital signs are stable she is alert and oriented x3 there is no erythema edema cellulitis drainage or odor.  Pulses are strong and palpable.  Surgical sites going on to heal uneventfully she has great range of motion.  Radiographs taken today demonstrate Keller arthroplasty is intact as well as second metatarsal osteotomies and third metatarsal osteotomy which is gone on to heal uneventfully.  Assessment: Well-healing surgical foot right.  Plan: Follow-up with me as needed.

## 2021-05-15 ENCOUNTER — Other Ambulatory Visit: Payer: Self-pay

## 2021-05-15 ENCOUNTER — Encounter: Payer: Self-pay | Admitting: Advanced Practice Midwife

## 2021-05-15 ENCOUNTER — Ambulatory Visit: Payer: PPO | Admitting: Advanced Practice Midwife

## 2021-05-15 VITALS — BP 120/80 | Ht 62.0 in | Wt 178.0 lb

## 2021-05-15 DIAGNOSIS — N951 Menopausal and female climacteric states: Secondary | ICD-10-CM

## 2021-05-15 DIAGNOSIS — Z0000 Encounter for general adult medical examination without abnormal findings: Secondary | ICD-10-CM | POA: Diagnosis not present

## 2021-05-15 DIAGNOSIS — Z1239 Encounter for other screening for malignant neoplasm of breast: Secondary | ICD-10-CM | POA: Diagnosis not present

## 2021-05-15 MED ORDER — ESTRADIOL 0.0375 MG/24HR TD PTTW: MEDICATED_PATCH | TRANSDERMAL | 11 refills | Status: DC

## 2021-05-15 NOTE — Progress Notes (Addendum)
Gynecology Annual Exam  PCP: Martinique, Betty G, MD  Chief Complaint:  Chief Complaint  Patient presents with   Annual Exam    History of Present Illness:Patient is a 70 y.o. G0P0000 presents for annual exam. The patient has no gyn complaints today. She does request a refill of her estradiol patch as she still has symptoms when she extends the use of a patch. Last mammogram was in August of this year. Colonoscopy done in 2018. Last DEXA scan done in 2021- diagnosis Osteopenia. Last PAP smear done in 2018 and was normal. She does have a history of HPV/colposcopy and hysterectomy done at age 50 for precancerous cervix.  LMP: No LMP recorded (lmp unknown). Patient has had a hysterectomy.   The patient is not currently sexually active. She denies dyspareunia.  The patient does perform self breast exams.  There is no notable family history of breast or ovarian cancer in her family. Her maternal aunt had breast cancer at unknown age.  The patient wears seatbelts: yes.   The patient has regular exercise:  she had recent foot surgery and is now working back up to walking 5 miles per day, she tries to eat a healthy diet and stay hydrated, her sleep is somewhat interrupted .    The patient denies current symptoms of depression.  Anxiety is controlled with medication.  Review of Systems: Review of Systems  Constitutional:  Negative for chills and fever.  HENT:  Negative for congestion, ear discharge, ear pain, hearing loss, sinus pain and sore throat.   Eyes:  Negative for blurred vision and double vision.  Respiratory:  Negative for cough, shortness of breath and wheezing.   Cardiovascular:  Negative for chest pain, palpitations and leg swelling.  Gastrointestinal:  Negative for abdominal pain, blood in stool, constipation, diarrhea, heartburn, melena, nausea and vomiting.  Genitourinary:  Negative for dysuria, flank pain, frequency, hematuria and urgency.  Musculoskeletal:  Positive for back  pain and joint pain. Negative for myalgias.  Skin:  Negative for itching and rash.  Neurological:  Negative for dizziness, tingling, tremors, sensory change, speech change, focal weakness, seizures, loss of consciousness, weakness and headaches.  Endo/Heme/Allergies:  Negative for environmental allergies. Bruises/bleeds easily.       Positive for hot flashes  Psychiatric/Behavioral:  Negative for depression, hallucinations, memory loss, substance abuse and suicidal ideas. The patient is not nervous/anxious and does not have insomnia.        Positive for anxiety   Past Medical History:  Patient Active Problem List   Diagnosis Date Noted   Spinal stenosis of lumbar region 12/31/2020   Acquired absence of both cervix and uterus 10/01/2020   Age-related osteoporosis without current pathological fracture 10/01/2020   Cervical high risk human papillomavirus (HPV) DNA test positive 10/01/2020   Menopause 10/01/2020   Stress incontinence (female) (female) 10/01/2020   Prediabetes 07/26/2019   Insulin resistance 06/14/2019   Dupuytren's disease of palm 02/24/2019   Trigger finger 02/24/2019   Hypothyroidism 05/19/2018   Vitamin D deficiency 12/16/2017   Class 1 obesity with serious comorbidity and body mass index (BMI) of 30.0 to 30.9 in adult 12/16/2017   Benign essential tremor 07/26/2017   Thyroid nodule 07/04/2017   Essential hypertension 06/07/2017   GERD (gastroesophageal reflux disease) 03/30/2016   Goiter, 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 Thyroid u/s 08/01/15 Mild multinodular gland, no dominant nodules.    Anxiety disorder 03/29/2016   Insomnia 03/29/2016   Osteopenia 03/29/2016  Migraine headache without aura 03/29/2016   Hyperlipidemia 03/29/2016   History of basal cell carcinoma 03/29/2016    Bx 03/2015.    Generalized osteoarthritis of multiple sites 03/29/2016    Rheuma 09/2015    Back pain, chronic 03/29/2016    Past Surgical History:  Past Surgical History:   Procedure Laterality Date   ABDOMINAL HYSTERECTOMY  1978   CATARACT EXTRACTION Left 12/20/2017   will have the right one completed a month later    CHOLECYSTECTOMY  2000   COLONOSCOPY  2007   JOINT REPLACEMENT Right 08/19/2017   great toe    UPPER GASTROINTESTINAL ENDOSCOPY  [id number]    Gynecologic History:  No LMP recorded (lmp unknown). Patient has had a hysterectomy. Last Pap: 2018 Results were:  no abnormalities  Last mammogram: August 2022 Results were: Gillian Shields I  Obstetric History: G0P0000  Family History:  Family History  Problem Relation Age of Onset   Arthritis Mother    Diabetes Mother    COPD Mother    Heart failure Mother    Stroke Father    Hypertension Father    Hyperlipidemia Father    Diabetes Father    CAD Father    Heart disease Father    Heart failure Father    Breast cancer Maternal Aunt    Cancer Sister        bladder cancer    Throat cancer Brother    Colon cancer Neg Hx    Stomach cancer Neg Hx    Esophageal cancer Neg Hx     Social History:  Social History   Socioeconomic History   Marital status: Single    Spouse name: Not on file   Number of children: 0   Years of education: 12   Highest education level: Not on file  Occupational History   Occupation: retired    Comment: administration  Tobacco Use   Smoking status: Former    Packs/day: 1.50    Years: 25.00    Pack years: 37.50    Types: Cigarettes    Quit date: 03/03/1990    Years since quitting: 31.2   Smokeless tobacco: Never  Vaping Use   Vaping Use: Never used  Substance and Sexual Activity   Alcohol use: Yes    Comment: glass of wine once a month   Drug use: No   Sexual activity: Never  Other Topics Concern   Not on file  Social History Narrative   Lives alone   Caffeine- tea, 1 cup   Social Determinants of Health   Financial Resource Strain: Low Risk    Difficulty of Paying Living Expenses: Not hard at all  Food Insecurity: Not on file  Transportation  Needs: Not on file  Physical Activity: Not on file  Stress: Not on file  Social Connections: Not on file  Intimate Partner Violence: Not on file    Allergies:  Allergies  Allergen Reactions   Hydrocodone Nausea Only    Per patient    Meloxicam Other (See Comments)    "skin infection on leg"   Sulfa Antibiotics Rash    Rash all over body/fever    Medications: Prior to Admission medications   Medication Sig Start Date End Date Taking? Authorizing Provider  Acetaminophen (TYLENOL PO) Take 500 mg by mouth 2 (two) times daily.   Yes [provider]  albuterol (PROVENTIL HFA;VENTOLIN HFA) 108 (90 Base) MCG/ACT inhaler Inhale 2 puffs into the lungs every 6 (six) hours as needed for wheezing  or shortness of breath. 08/10/18  Yes Martinique, Betty G, MD  ALPRAZolam Duanne Moron) 0.25 MG tablet Take 1 tablet by mouth twice daily as needed for anxiety 02/16/21  Yes Martinique, Betty G, MD  atorvastatin (LIPITOR) 10 MG tablet Take 1 tablet (10 mg total) by mouth daily. 02/19/21  Yes Martinique, Betty G, MD  cyclobenzaprine (FLEXERIL) 5 MG tablet TAKE 1 TABLET BY MOUTH AT BEDTIME 10/31/20  Yes Martinique, Betty G, MD  Magnesium 250 MG TABS Take by mouth daily.   Yes [provider]  meclizine (ANTIVERT) 12.5 MG tablet Take 1 tablet (12.5 mg total) by mouth 3 (three) times daily as needed for dizziness. 03/30/16  Yes Martinique, Betty G, MD  Multiple Vitamin (MULTIVITAMIN) tablet Take 1 tablet by mouth daily.   Yes [provider]  pantoprazole (PROTONIX) 40 MG tablet Take 1 tablet (40 mg total) by mouth daily. 05/04/21  Yes Martinique, Betty G, MD  PARoxetine (PAXIL) 10 MG tablet Take 1 tablet (10 mg total) by mouth daily. 02/24/21  Yes Martinique, Betty G, MD  propranolol (INDERAL) 40 MG tablet Take 1 tablet (40 mg total) by mouth 2 (two) times daily. 05/04/21  Yes Martinique, Betty G, MD  RESTASIS 0.05 % ophthalmic emulsion Place into both eyes 2 (two) times daily. 08/29/19  Yes [provider]  SYNTHROID  25 MCG tablet Take 1 tablet (25 mcg total) by mouth daily before breakfast. 11/24/20  Yes Martinique, Betty G, MD  VOLTAREN 1 % GEL SMARTSIG:1 Sparingly Topical PRN 11/21/19  Yes [provider]  estradiol (VIVELLE-DOT) 0.0375 MG/24HR APPLY 1 PATCH TOPICALLY TWICE A WEEK 05/15/21   Rod Can, CNM    Physical Exam Vitals: Blood pressure 120/80, height 5\' 2"  (1.575 m), weight 178 lb (80.7 kg).  General: NAD HEENT: normocephalic, anicteric Thyroid: no enlargement, no palpable nodules Pulmonary: No increased work of breathing, CTAB Cardiovascular: RRR, distal pulses 2+ Breast: Breast symmetrical, no tenderness, no palpable nodules or masses, no skin or nipple retraction present, no nipple discharge.  No axillary or supraclavicular lymphadenopathy. Abdomen: NABS, soft, non-tender, non-distended.  Umbilicus without lesions.  No hepatomegaly, splenomegaly or masses palpable. No evidence of hernia  Genitourinary: deferred for no concerns/PAP interval Extremities: no edema, erythema, or tenderness Neurologic: Grossly intact Psychiatric: mood appropriate, affect full    Assessment: 70 y.o. G0P0000 routine annual exam  Plan: Problem List Items Addressed This Visit   None Visit Diagnoses     Preventative health care    -  Primary   Relevant Medications   estradiol (VIVELLE-DOT) 0.0375 MG/24HR   Other Relevant Orders   MM 3D SCREEN BREAST BILATERAL   Breast screening       Relevant Orders   MM 3D SCREEN BREAST BILATERAL   Vasomotor symptoms due to menopause       Relevant Medications   estradiol (VIVELLE-DOT) 0.0375 MG/24HR       1) Mammogram - recommend yearly screening mammogram.  Mammogram Was ordered today  2) STI screening  was not offered and therefore not obtained  3) ASCCP guidelines and rationale discussed.  Patient opts for  5 year  screening interval, with possible discontinuation after next screening. Due next year  4) Osteoporosis  - per USPTF routine  screening DEXA at age 51 - FRAX 59 year major fracture risk 9.8%,  10 year hip fracture risk 1.5%  Consider FDA-approved medical therapies in postmenopausal women and men aged 78 years and older, based on the following: a) A hip or vertebral (  clinical or morphometric) fracture b) T-score ? -2.5 at the femoral neck or spine after appropriate evaluation to exclude secondary causes C) Low bone mass (T-score between -1.0 and -2.5 at the femoral neck or spine) and a 10-year probability of a hip fracture ? 3% or a 10-year probability of a major osteoporosis-related fracture ? 20% based on the [place]-adapted WHO algorithm   5) Routine healthcare maintenance including cholesterol, diabetes screening discussed managed by PCP  6) Colonoscopy due in 2028.  Screening recommended starting at age 61 for average risk individuals, age 18 for individuals deemed at increased risk (including African Americans) and recommended to continue until age 61.  For patient age 93-85 individualized approach is recommended.  Gold standard screening is via colonoscopy, Cologuard screening is an acceptable alternative for patient unwilling or unable to undergo colonoscopy.  "Colorectal cancer screening for average?risk adults: 2018 guideline update from the [place]: A Cancer Journal for Clinicians: Jan 05, 2017   7) Return in about 2 years (around 05/16/2023) for gyn exam.    Christean Leaf, [facility] 05/15/21, 11:43 AM

## 2021-05-19 ENCOUNTER — Telehealth: Payer: Self-pay

## 2021-05-19 NOTE — Telephone Encounter (Signed)
Pharmacy called to change estradiol to a 90 day prescription

## 2021-06-10 ENCOUNTER — Other Ambulatory Visit: Payer: Self-pay | Admitting: Family Medicine

## 2021-06-10 DIAGNOSIS — M545 Low back pain, unspecified: Secondary | ICD-10-CM

## 2021-06-10 DIAGNOSIS — G8929 Other chronic pain: Secondary | ICD-10-CM

## 2021-06-23 ENCOUNTER — Telehealth: Payer: Self-pay | Admitting: Pharmacist

## 2021-06-23 NOTE — Chronic Care Management (AMB) (Signed)
Chronic Care Management Pharmacy Assistant   Name: Kristina [name]  MRN: [id number] DOB: [birth year]  Reason for Encounter: Disease State / Hypertension Assessment Call   Conditions to be addressed/monitored: HTN  Recent office visits:  02/16/2021 Kristina Martinique MD (PCP) - Patient was seen for insomnia and additional issues. No medication changes. Follow up in 6 months.  Recent consult visits:  05/15/2021 Kristina [name] CNM (obstetrics) - Patient was seen for preventative health are and additional issues. No medication changes. Follow up in 2 years.  05/13/2021 Kristina [name] DPM (podiatry) - Patient was seen for injury of plantar plate of right foot and an additional issue. No medication changes.  Follow up if symptoms worsen or fail to improve.  04/08/2021 Kristina [name] DPM (podiatry) - Patient was seen for capsulitis of right foot and additional issues. No medication changes. Follow up in 3-4 weeks.  03/30/2021 Kristina Patty MD (dermatology) - Patient was seen for inflamed seborrheic keratosis and an additional issues. No medication changes. Follow up in 6 months.  03/25/2021 Kristina [name] DPM (podiatry) - Patient was seen for injury of plantar plate of right foot and an additional issue. Discontinued Cephalexin and Ondansetron. Follow up in 2 weeks.   03/18/2021  Kristina [name] DPM (podiatry) - Patient was seen for capsulitis of right foot and additional issues. No medication changes. Follow up in 1 week.  03/11/2021 Kristina [name] DPM (podiatry) - Patient was seen for capsulitis of right foot and additional issues. No medication changes. Follow up in 1 week.  01/28/2021 Kristina [name] DPM (podiatry) - Patient was seen for capsulitis of right foot and additional issues. No medication changes. Follow up after surgery.  Hospital visits:  None  Medications: Outpatient Encounter Medications as of 06/23/2021  Medication Sig   Acetaminophen (TYLENOL PO) Take 500 mg by mouth 2 (two) times daily.   albuterol  (PROVENTIL HFA;VENTOLIN HFA) 108 (90 Base) MCG/ACT inhaler Inhale 2 puffs into the lungs every 6 (six) hours as needed for wheezing or shortness of breath.   ALPRAZolam (XANAX) 0.25 MG tablet Take 1 tablet by mouth twice daily as needed for anxiety   atorvastatin (LIPITOR) 10 MG tablet Take 1 tablet (10 mg total) by mouth daily.   cyclobenzaprine (FLEXERIL) 5 MG tablet TAKE 1 TABLET BY MOUTH AT BEDTIME   estradiol (VIVELLE-DOT) 0.0375 MG/24HR APPLY 1 PATCH TOPICALLY TWICE A WEEK   Magnesium 250 MG TABS Take by mouth daily.   meclizine (ANTIVERT) 12.5 MG tablet Take 1 tablet (12.5 mg total) by mouth 3 (three) times daily as needed for dizziness.   Multiple Vitamin (MULTIVITAMIN) tablet Take 1 tablet by mouth daily.   pantoprazole (PROTONIX) 40 MG tablet Take 1 tablet (40 mg total) by mouth daily.   PARoxetine (PAXIL) 10 MG tablet Take 1 tablet (10 mg total) by mouth daily.   propranolol (INDERAL) 40 MG tablet Take 1 tablet (40 mg total) by mouth 2 (two) times daily.   RESTASIS 0.05 % ophthalmic emulsion Place into both eyes 2 (two) times daily.   SYNTHROID 25 MCG tablet Take 1 tablet (25 mcg total) by mouth daily before breakfast.   VOLTAREN 1 % GEL SMARTSIG:1 Sparingly Topical PRN   No facility-administered encounter medications on file as of 06/23/2021.   Fill History: ALPRAZolam 0.25MG    TAB 06/10/2021 23   ATORVASTATIN CALCIUM 10MG  TABLET 02/23/2021 90   CYCLOBENZAPR 5MG     TAB 06/10/2021 21   ESTRADIOL 0.0375MG /24HR PATCH BIWEEKLY 05/19/2021 84   LEVOTHYROXINE SODIUM 25MCG TABLET  03/03/2021 90   PAROXETINE HYDROCHLORIDE 10MG  TABLET 02/25/2021 90   PANTOPRAZOLE SODIUM 40MG  TABLET DELAYED RELEASE 05/06/2021 90   Reviewed chart prior to disease state call. Spoke with patient regarding BP  Recent Office Vitals: BP Readings from Last 3 Encounters:  05/15/21 120/80  02/16/21 128/70  08/19/20 120/74   Pulse Readings from Last 3 Encounters:  02/16/21 82  08/19/20 84  03/19/20  78    Wt Readings from Last 3 Encounters:  05/15/21 178 lb (80.7 kg)  02/16/21 178 lb 8 oz (81 kg)  08/19/20 175 lb (79.4 kg)     Kidney Function Lab Results  Component Value Date/Time   CREATININE 0.96 02/16/2021 10:33 AM   CREATININE 0.88 08/19/2020 10:34 AM   CREATININE 0.91 09/09/2017 09:59 AM   GFR 59.86 (L) 02/16/2021 10:33 AM   GFRNONAA 62 11/21/2019 11:00 AM   GFRNONAA 65 09/09/2017 09:59 AM   GFRAA 72 11/21/2019 11:00 AM   GFRAA 76 09/09/2017 09:59 AM    BMP Latest Ref Rng & Units 02/16/2021 08/19/2020 11/21/2019  Glucose 70 - 99 mg/dL 96 108(H) 94  BUN 6 - 23 mg/dL 17 25(H) 26  Creatinine 0.40 - 1.20 mg/dL 0.96 0.88 0.94  BUN/Creat Ratio 12 - 28 - - 28  Sodium 135 - 145 mEq/L 136 134(L) 135  Potassium 3.5 - 5.1 mEq/L 4.4 4.6 4.6  Chloride 96 - 112 mEq/L 102 105 100  CO2 19 - 32 mEq/L 26 22 23   Calcium 8.4 - 10.5 mg/dL 9.1 9.0 9.4   Current antihypertensive regimen:  Propranolol 40 mg daily  How often are you checking your Blood Pressure?   Current home BP readings:   What recent interventions/DTPs have been made by any provider to improve Blood Pressure control since last CPP Visit:   Any recent hospitalizations or ED visits since last visit with CPP? No  What diet changes have been made to improve Blood Pressure Control?    What exercise is being done to improve your Blood Pressure Control?    Adherence Review: Is the patient currently on ACE/ARB medication? No Does the patient have >5 day gap between last estimated fill dates?   Spoke with patient, she has requested to be unenrolled, she does not feel the need for our services, she would prefer to follow up with Dr. Martinique if she has any medication problems.  Care Gaps: AWV - message sent to Ramond Craver Last BP - 120/80 on 05/15/2021 Pneumovax - overdue Covid vaccine - overdue  Star Rating Drugs: Atorvastatin 10 mg - last filled 02/23/2021 90 DS at [facility] verified with [place] Pharmacist Assistant [phone]

## 2021-07-31 ENCOUNTER — Other Ambulatory Visit: Payer: Self-pay | Admitting: Family Medicine

## 2021-07-31 DIAGNOSIS — G4700 Insomnia, unspecified: Secondary | ICD-10-CM

## 2021-08-04 NOTE — Telephone Encounter (Signed)
-   last filled 06/10/21 - last office visit 02/16/21 - next office visit is not scheduled. - no medication contract on file.

## 2021-09-10 ENCOUNTER — Other Ambulatory Visit: Payer: Self-pay | Admitting: Family Medicine

## 2021-09-10 DIAGNOSIS — G4700 Insomnia, unspecified: Secondary | ICD-10-CM

## 2021-09-11 NOTE — Telephone Encounter (Signed)
-   last filled 08/04/21 - last office visit in 02/2021 - next office visit is not scheduled.

## 2021-10-09 ENCOUNTER — Other Ambulatory Visit: Payer: Self-pay | Admitting: Family Medicine

## 2021-10-09 DIAGNOSIS — E038 Other specified hypothyroidism: Secondary | ICD-10-CM

## 2021-10-09 DIAGNOSIS — F418 Other specified anxiety disorders: Secondary | ICD-10-CM

## 2021-10-09 DIAGNOSIS — N951 Menopausal and female climacteric states: Secondary | ICD-10-CM

## 2021-10-13 ENCOUNTER — Other Ambulatory Visit: Payer: Self-pay

## 2021-10-13 ENCOUNTER — Ambulatory Visit: Payer: PPO | Admitting: Dermatology

## 2021-10-13 DIAGNOSIS — L719 Rosacea, unspecified: Secondary | ICD-10-CM

## 2021-10-13 DIAGNOSIS — I781 Nevus, non-neoplastic: Secondary | ICD-10-CM

## 2021-10-13 DIAGNOSIS — Z1283 Encounter for screening for malignant neoplasm of skin: Secondary | ICD-10-CM | POA: Diagnosis not present

## 2021-10-13 DIAGNOSIS — D692 Other nonthrombocytopenic purpura: Secondary | ICD-10-CM | POA: Diagnosis not present

## 2021-10-13 DIAGNOSIS — L814 Other melanin hyperpigmentation: Secondary | ICD-10-CM

## 2021-10-13 DIAGNOSIS — Z85828 Personal history of other malignant neoplasm of skin: Secondary | ICD-10-CM

## 2021-10-13 DIAGNOSIS — D1800 Hemangioma unspecified site: Secondary | ICD-10-CM

## 2021-10-13 DIAGNOSIS — L821 Other seborrheic keratosis: Secondary | ICD-10-CM

## 2021-10-13 DIAGNOSIS — Z872 Personal history of diseases of the skin and subcutaneous tissue: Secondary | ICD-10-CM | POA: Diagnosis not present

## 2021-10-13 DIAGNOSIS — L578 Other skin changes due to chronic exposure to nonionizing radiation: Secondary | ICD-10-CM | POA: Diagnosis not present

## 2021-10-13 DIAGNOSIS — L820 Inflamed seborrheic keratosis: Secondary | ICD-10-CM | POA: Diagnosis not present

## 2021-10-13 DIAGNOSIS — D229 Melanocytic nevi, unspecified: Secondary | ICD-10-CM

## 2021-10-13 MED ORDER — AZELAIC ACID 15 % EX GEL: 1.0000 "application " | Freq: Two times a day (BID) | CUTANEOUS | 3 refills | Status: AC

## 2021-10-13 NOTE — Progress Notes (Signed)
? ?Follow-Up Visit ?  ?Subjective  ?Kristina [name] is a 71 y.o. female who presents for the following: Follow-up (Patient here today for 6 month tbse. Patient reports a spot at left calf and right clavicle area. ).  They get itchy and irritated. ? ?The patient presents for Total-Body Skin Exam (TBSE) for skin cancer screening and mole check.  The patient has spots, moles and lesions to be evaluated, some may be new or changing and the patient has concerns that these could be cancer. ? ? ?The following portions of the chart were reviewed this encounter and updated as appropriate:   ?  ? ?Review of Systems: No other skin or systemic complaints except as noted in HPI or Assessment and Plan. ? ? ?Objective  ?Well appearing patient in no apparent distress; mood and affect are within normal limits. ? ?A full examination was performed including scalp, head, eyes, ears, nose, lips, neck, chest, axillae, abdomen, back, buttocks, bilateral upper extremities, bilateral lower extremities, hands, feet, fingers, toes, fingernails, and toenails. All findings within normal limits unless otherwise noted below. ? ?Head - Anterior (Face) ?Erythema on the nasal tip and malar cheeks ? ?right upper clavicle x1, right medial calf x 1, left mid lower back x 1, left upper arm x 1 (4) ?Erythematous stuck-on, waxy papule or plaque ? ? ?Assessment & Plan  ?Rosacea ?Head - Anterior (Face) ? ?Chronic condition with duration or expected duration over one year. Currently well-controlled on Finacea gel. ? ?Rosacea is a chronic progressive skin condition usually affecting the face of adults, causing redness and/or acne bumps. It is treatable but not curable. It sometimes affects the eyes (ocular rosacea) as well. It may respond to topical and/or systemic medication and can flare with stress, sun exposure, alcohol, exercise and some foods.  Daily application of broad spectrum spf 30+ sunscreen to face is recommended to reduce flares. ? ?Continue  Azelaic Acid 15 % gel apply twice daily ? ?Patient prefers to get azelaic acid 15%  gel sent to [facility] for 3 month supply  ? ? ? ? ? ?Azelaic Acid (FINACEA) 15 % gel - Head - Anterior (Face) ?Apply 1 application. topically 2 (two) times daily. After skin is thoroughly washed and patted dry, gently but thoroughly massage a thin film of azelaic acid cream into the affected area twice daily, in the morning and evening. ? ?Inflamed seborrheic keratosis (4) ?right upper clavicle x1, right medial calf x 1, left mid lower back x 1, left upper arm x 1 ? ? ? ?Destruction of lesion - right upper clavicle x1, right medial calf x 1, left mid lower back x 1, left upper arm x 1 ? ?Destruction method: cryotherapy   ?Informed consent: discussed and consent obtained   ?Lesion destroyed using liquid nitrogen: Yes   ?Region frozen until ice ball extended beyond lesion: Yes   ?Outcome: patient tolerated procedure well with no complications   ?Post-procedure details: wound care instructions given   ?Additional details:  Prior to procedure, discussed risks of blister formation, small wound, skin dyspigmentation, or rare scar following cryotherapy. Recommend Vaseline ointment to treated areas while healing. ? ? ?Lentigines ?- Scattered tan macules ?- Due to sun exposure ?- Benign-appearing, observe ?- Recommend daily broad spectrum sunscreen SPF 30+ to sun-exposed areas, reapply every 2 hours as needed. ?- Call for any changes ? ?Seborrheic Keratoses ?- Stuck-on, waxy, tan-brown papules and/or plaques  ?- Benign-appearing ?- Discussed benign etiology and prognosis. ?- Observe ?- Call for any  changes ? ?Telangiectasia ?- 5 mm blanching pink macule with rounding telangiectasia at right malar cheek  ?- Benign appearing on exam ?- Call for changes ? ?Melanocytic Nevi ?- Tan-brown and/or pink-flesh-colored symmetric macules and papules ?- Benign appearing on exam today ?- Observation ?- Call clinic for new or changing moles ?- Recommend daily  use of broad spectrum spf 30+ sunscreen to sun-exposed areas.  ? ?Purpura - Chronic; persistent and recurrent.  Treatable, but not curable. ?- Violaceous macules and patches at arms  ?- Benign ?- Related to trauma, age, sun damage and/or use of blood thinners, chronic use of topical and/or oral steroids ?- Observe ?- Can use OTC arnica containing moisturizer such as Dermend Bruise Formula if desired ?- Call for worsening or other concerns ? ?Hemangiomas ?- Red papules ?- Discussed benign nature ?- Observe ?- Call for any changes ? ?Actinic Damage ?- Chronic condition, secondary to cumulative UV/sun exposure ?- diffuse scaly erythematous macules with underlying dyspigmentation ?- Recommend daily broad spectrum sunscreen SPF 30+ to sun-exposed areas, reapply every 2 hours as needed.  ?- Staying in the shade or wearing long sleeves, sun glasses (UVA+UVB protection) and wide brim hats (4-inch brim around the entire circumference of the hat) are also recommended for sun protection.  ?- Call for new or changing lesions. ? ?History of PreCancerous Actinic Keratosis  ?- site(s) of PreCancerous Actinic Keratosis clear today at right upper temple  ?- these may recur and new lesions may form requiring treatment to prevent transformation into skin cancer ?- observe for new or changing spots and contact [place] for appointment if occur ?- photoprotection with sun protective clothing; sunglasses and broad spectrum sunscreen with SPF of at least 30 + and frequent self skin exams recommended ?- yearly exams by a dermatologist recommended for persons with history of PreCancerous Actinic Keratoses ? ?History of Squamous Cell Carcinoma of the Skin ?- No evidence of recurrence today at nose (2009 Mohs) ?- No lymphadenopathy ?- Recommend regular full body skin exams ?- Recommend daily broad spectrum sunscreen SPF 30+ to sun-exposed areas, reapply every 2 hours as needed.  ?- Call if any new or changing lesions are noted  between office visits ? ?Skin cancer screening performed today. ?Return for 1 year tbse h/o rosacea . ? ?I, Ruthell Rummage, CMA, am acting as scribe for [name], MD.. ? ?Documentation: I have reviewed the above documentation for accuracy and completeness, and I agree with the above. ? ?[name] MD  ? ?

## 2021-10-13 NOTE — Patient Instructions (Addendum)
For Rosacea can try The ordinary azelaic acid 10% brightening cream found at [place]     Seborrheic Keratosis  What causes seborrheic keratoses? Seborrheic keratoses are harmless, common skin growths that first appear during adult life.  As time goes by, more growths appear.  Some people may develop a large number of them.  Seborrheic keratoses appear on both covered and uncovered body parts.  They are not caused by sunlight.  The tendency to develop seborrheic keratoses can be inherited.  They vary in color from skin-colored to gray, brown, or even black.  They can be either smooth or have a rough, warty surface.   Seborrheic keratoses are superficial and look as if they were stuck on the skin.  Under the microscope this type of keratosis looks like layers upon layers of skin.  That is why at times the top layer may seem to fall off, but the rest of the growth remains and re-grows.    Treatment Seborrheic keratoses do not need to be treated, but can easily be removed in the office.  Seborrheic keratoses often cause symptoms when they rub on clothing or jewelry.  Lesions can be in the way of shaving.  If they become inflamed, they can cause itching, soreness, or burning.  Removal of a seborrheic keratosis can be accomplished by freezing, burning, or surgery. If any spot bleeds, scabs, or grows rapidly, please return to have it checked, as these can be an indication of a skin cancer.  Cryotherapy Aftercare  Wash gently with soap and water everyday.   Apply Vaseline and Band-Aid daily until healed.         Melanoma ABCDEs  Melanoma is the most dangerous type of skin cancer, and is the leading cause of death from skin disease.  You are more likely to develop melanoma if you: Have light-colored skin, light-colored eyes, or red or blond hair Spend a lot of time in the sun Tan regularly, either outdoors or in a tanning bed Have had blistering sunburns, especially during childhood Have a close  family member who has had a melanoma Have atypical moles or large birthmarks  Early detection of melanoma is key since treatment is typically straightforward and cure rates are extremely high if we catch it early.   The first sign of melanoma is often a change in a mole or a new dark spot.  The ABCDE system is a way of remembering the signs of melanoma.  A for asymmetry:  The two halves do not match. B for border:  The edges of the growth are irregular. C for color:  A mixture of colors are present instead of an even brown color. D for diameter:  Melanomas are usually (but not always) greater than 43m - the size of a pencil eraser. E for evolution:  The spot keeps changing in size, shape, and color.  Please check your skin once per month between visits. You can use a small mirror in front and a large mirror behind you to keep an eye on the back side or your body.   If you see any new or changing lesions before your next follow-up, please call to schedule a visit.  Please continue daily skin protection including broad spectrum sunscreen SPF 30+ to sun-exposed areas, reapplying every 2 hours as needed when you're outdoors.   Staying in the shade or wearing long sleeves, sun glasses (UVA+UVB protection) and wide brim hats (4-inch brim around the entire circumference of the hat) are also  recommended for sun protection.    If You Need Anything After Your Visit  If you have any questions or concerns for your doctor, please call our main line at [phone] and press option 4 to reach your doctor's medical assistant. If no one answers, please leave a voicemail as directed and we will return your call as soon as possible. Messages left after 4 pm will be answered the following business day.   You may also send [place] a message via [place]. We typically respond to MyChart messages within 1-2 business days.  For prescription refills, please ask your pharmacy to contact our office. Our fax number is  [phone].  If you have an urgent issue when the clinic is closed that cannot wait until the next business day, you can page your doctor at the number below.    Please note that while we do our best to be available for urgent issues outside of office hours, we are not available 24/7.   If you have an urgent issue and are unable to reach [place], you may choose to seek medical care at your doctor's office, retail clinic, urgent care center, or emergency room.  If you have a medical emergency, please immediately call 911 or go to the emergency department.  Pager Numbers  - Dr. Nehemiah Massed: [phone]  - Dr. Laurence Ferrari: [phone]  - Dr. Nicole Kindred: [phone]  In the event of inclement weather, please call our main line at [phone] for an update on the status of any delays or closures.  Dermatology Medication Tips: Please keep the boxes that topical medications come in in order to help keep track of the instructions about where and how to use these. Pharmacies typically print the medication instructions only on the boxes and not directly on the medication tubes.   If your medication is too expensive, please contact our office at [phone] option 4 or send [place] a message through [place].   We are unable to tell what your co-pay for medications will be in advance as this is different depending on your insurance coverage. However, we may be able to find a substitute medication at lower cost or fill out paperwork to get insurance to cover a needed medication.   If a prior authorization is required to get your medication covered by your insurance company, please allow [place] 1-2 business days to complete this process.  Drug prices often vary depending on where the prescription is filled and some pharmacies may offer cheaper prices.  The website www.goodrx.com contains coupons for medications through different pharmacies. The prices here do not account for what the cost may be with help from  insurance (it may be cheaper with your insurance), but the website can give you the price if you did not use any insurance.  - You can print the associated coupon and take it with your prescription to the pharmacy.  - You may also stop by our office during regular business hours and pick up a GoodRx coupon card.  - If you need your prescription sent electronically to a different pharmacy, notify our office through [facility] or by phone at [phone] option 4.     Si Usted Necesita Algo Despus de Su Visita  Tambin puede enviarnos un mensaje a travs de Pharmacist, community. Por lo general respondemos a los mensajes de MyChart en el transcurso de 1 a 2 das hbiles.  Para renovar recetas, por favor pida a su farmacia que se ponga en contacto con nuestra oficina. Oswald Hillock nmero  de fax es el [phone].  Si tiene un asunto urgente cuando la clnica est cerrada y que no puede esperar hasta el siguiente da hbil, puede llamar/localizar a su doctor(a) al nmero que aparece a continuacin.   Por favor, tenga en cuenta que aunque hacemos todo lo posible para estar disponibles para asuntos urgentes fuera del horario de [place], no estamos disponibles las 24 horas del da, los 7 das de la [place].   Si tiene un problema urgente y no puede comunicarse con nosotros, puede optar por buscar atencin mdica  en el consultorio de su doctor(a), en una clnica privada, en un centro de atencin urgente o en una sala de emergencias.  Si tiene Engineering geologist, por favor llame inmediatamente al 911 o vaya a la sala de emergencias.  Nmeros de bper  - Dr. Nehemiah Massed: [phone]  - Dra. Moye: [phone]  - Dra. Nicole Kindred: [phone]  En caso de inclemencias del [place], por favor llame a Johnsie Kindred principal al [phone] para una actualizacin sobre el [place] de cualquier retraso o cierre.  Consejos para la medicacin en dermatologa: Por favor, guarde las cajas en las que vienen los  medicamentos de uso tpico para ayudarle a seguir las instrucciones sobre dnde y cmo usarlos. Las farmacias generalmente imprimen las instrucciones del medicamento slo en las cajas y no directamente en los tubos del [place].   Si su medicamento es muy caro, por favor, pngase en contacto con Zigmund Daniel llamando al [phone] y presione la opcin 4 o envenos un mensaje a travs de Pharmacist, community.   No podemos decirle cul ser su copago por los medicamentos por adelantado ya que esto es diferente dependiendo de la cobertura de su seguro. Sin embargo, es posible que podamos encontrar un medicamento sustituto a Electrical engineer un formulario para que el seguro cubra el medicamento que se considera necesario.   Si se requiere una autorizacin previa para que su compaa de seguros [place] su medicamento, por favor permtanos de 1 a 2 das hbiles para completar este proceso.  Los precios de los medicamentos varan con frecuencia dependiendo del Environmental consultant de dnde se surte la receta y alguna farmacias pueden ofrecer precios ms baratos.  El sitio web www.goodrx.com tiene cupones para medicamentos de Airline pilot. Los precios aqu no tienen en cuenta lo que podra costar con la ayuda del seguro (puede ser ms barato con su seguro), pero el sitio web puede darle el precio si no utiliz Research scientist (physical sciences).  - Puede imprimir el cupn correspondiente y llevarlo con su receta a la farmacia.  - Tambin puede pasar por nuestra oficina durante el horario de atencin regular y Charity fundraiser una tarjeta de cupones de GoodRx.  - Si necesita que su receta se enve electrnicamente a una farmacia diferente, informe a nuestra oficina a travs de MyChart de [place] o por telfono llamando al [phone] y presione la opcin 4.

## 2021-10-20 ENCOUNTER — Other Ambulatory Visit: Payer: Self-pay | Admitting: Family Medicine

## 2021-10-20 DIAGNOSIS — G4700 Insomnia, unspecified: Secondary | ICD-10-CM

## 2021-10-23 ENCOUNTER — Other Ambulatory Visit: Payer: Self-pay | Admitting: Family Medicine

## 2021-10-23 DIAGNOSIS — G4700 Insomnia, unspecified: Secondary | ICD-10-CM

## 2021-10-23 NOTE — Progress Notes (Signed)
? ?HPI: ?Kristina [name] is a 71 y.o. female, who is here today for her routine physical. ? ?Last CPE: 12/18/2019 ? ?Regular exercise : Walking 7000 steps daily. ?Following a healthful diet: Mostly cooking at home, vegetables and fruits but acknowledges she has "a sweet tooth." ? ?Chronic medical problems: Hypertension, anxiety, benign essential tremor, GERD, nontoxic goiter, rosacea (follows with dermatologist), vitamin D deficiency, hypothyroidism, and chronic back pain among some. ?Follows with Dr Nelva Bush for lower back pain. ?Right foot pain, follows with podiatrist. ? ?Immunization History  ?Administered Date(s) Administered  ? Fluad Quad(high Dose 65+) 05/04/2021  ? Influenza, High Dose Seasonal PF 05/03/2017, 05/05/2018, 05/10/2019, 06/18/2019, 05/19/2020  ? Influenza-Unspecified 05/03/2017  ? PFIZER(Purple Top)SARS-COV-2 Vaccination 09/29/2019, 10/23/2019, 05/19/2020  ? Pneumococcal Conjugate (Pcv15) 04/23/2016  ? Pneumococcal Polysaccharide-23 05/03/2017  ? Tdap 08/10/2007, 10/05/2019  ? Unspecified SARS-COV-2 Vaccination 04/22/2021  ? Zoster Recombinat (Shingrix) 05/10/2019, 06/18/2019  ? ? ?Health Maintenance  ?Topic Date Due  ? COVID-19 Vaccine (5 - Booster for [place] series) 11/11/2021 (Originally 06/17/2021)  ? MAMMOGRAM  03/31/2023  ? COLONOSCOPY (Pts 45-67yr Insurance coverage will need to be confirmed)  03/10/2027  ? Pneumonia Vaccine 72 Years old  Completed  ? INFLUENZA VACCINE  Completed  ? DEXA SCAN  Completed  ? Hepatitis C Screening  Completed  ? Zoster Vaccines- Shingrix  Completed  ? HPV VACCINES  Aged Out  ? TETANUS/TDAP  Discontinued  ? ?Last appt with gyn on 05/15/2021, JRod Can CNM ?Osteopenia: Last DEXA on 03/07/20 osteopenia with FRAX score for major osteoporotic fracture 9.8% and 4 hip fracture 1.5%. ?She is on Ca++ and Vit D supplementation. ? ?Hypertension: Currently she is on propranolol 40 mg twice daily, which also helps with tremor. ?Lab Results  ?Component Value Date  ?  CREATININE 0.96 02/16/2021  ? BUN 17 02/16/2021  ? NA 136 02/16/2021  ? K 4.4 02/16/2021  ? CL 102 02/16/2021  ? CO2 26 02/16/2021  ? ?Hyperlipidemia: Currently she is on atorvastatin 10 mg daily. ?Lab Results  ?Component Value Date  ? CHOL 180 02/16/2021  ? HDL 47.20 02/16/2021  ? LDLCALC 110 (H) 02/16/2021  ? TRIG 113.0 02/16/2021  ? CHOLHDL 4 02/16/2021  ? ?Anxiety: She is on paroxetine 10 mg daily and alprazolam 0.25 mg twice daily as needed. ?She takes alprazolam 0.25 mg every night. ?She has tolerated medication well. ? ?Review of Systems  ?Constitutional:  Positive for fatigue. Negative for appetite change and fever.  ?HENT:  Negative for hearing loss, mouth sores and sore throat.   ?Eyes:  Negative for redness and visual disturbance.  ?Respiratory:  Negative for cough, shortness of breath and wheezing.   ?Cardiovascular:  Negative for chest pain and leg swelling.  ?Gastrointestinal:  Negative for abdominal pain, nausea and vomiting.  ?     No changes in bowel habits.  ?Endocrine: Negative for cold intolerance, heat intolerance, polydipsia, polyphagia and polyuria.  ?Genitourinary:  Negative for decreased urine volume, dysuria and hematuria.  ?Musculoskeletal:  Positive for arthralgias and back pain. Negative for gait problem.  ?Skin:  Negative for color change and rash.  ?Allergic/Immunologic: Positive for environmental allergies.  ?Neurological:  Negative for syncope, weakness and headaches.  ?Hematological:  Negative for adenopathy. Does not bruise/bleed easily.  ?Psychiatric/Behavioral:  Negative for confusion. The patient is nervous/anxious.   ?All other systems reviewed and are negative. ? ?Current Outpatient Medications on File Prior to Visit  ?Medication Sig Dispense Refill  ? Acetaminophen (TYLENOL PO) Take 500 mg by mouth  2 (two) times daily.    ? albuterol (PROVENTIL HFA;VENTOLIN HFA) 108 (90 Base) MCG/ACT inhaler Inhale 2 puffs into the lungs every 6 (six) hours as needed for wheezing or shortness  of breath. 1 Inhaler 2  ? atorvastatin (LIPITOR) 10 MG tablet Take 1 tablet (10 mg total) by mouth daily. 90 tablet 3  ? Azelaic Acid (FINACEA) 15 % gel Apply 1 application. topically 2 (two) times daily. After skin is thoroughly washed and patted dry, gently but thoroughly massage a thin film of azelaic acid cream into the affected area twice daily, in the morning and evening. 150 g 3  ? cyclobenzaprine (FLEXERIL) 5 MG tablet TAKE 1 TABLET BY MOUTH AT BEDTIME 90 tablet 0  ? estradiol (VIVELLE-DOT) 0.0375 MG/24HR APPLY 1 PATCH TOPICALLY TWICE A WEEK 8 patch 11  ? levothyroxine (SYNTHROID) 25 MCG tablet Take 1 tablet by mouth daily before breakfast 90 tablet 1  ? Magnesium 250 MG TABS Take by mouth daily.    ? meclizine (ANTIVERT) 12.5 MG tablet Take 1 tablet (12.5 mg total) by mouth 3 (three) times daily as needed for dizziness. 60 tablet 1  ? Multiple Vitamin (MULTIVITAMIN) tablet Take 1 tablet by mouth daily.    ? pantoprazole (PROTONIX) 40 MG tablet Take 1 tablet (40 mg total) by mouth daily. 90 tablet 2  ? PARoxetine (PAXIL) 10 MG tablet Take 1 tablet by mouth daily 90 tablet 1  ? RESTASIS 0.05 % ophthalmic emulsion Place into both eyes 2 (two) times daily.    ? VOLTAREN 1 % GEL SMARTSIG:1 Sparingly Topical PRN    ? ALPRAZolam (XANAX) 0.25 MG tablet Take 1 tablet (0.25 mg total) by mouth 2 (two) times daily as needed. for anxiety 45 tablet 3  ? ?No current facility-administered medications on file prior to visit.  ? ?Past Medical History:  ?Diagnosis Date  ? Actinic keratosis   ? Anxiety   ? Arthritis   ? Back pain   ? Cancer [facility])   ? basal and squamous cell skin  ? Chicken pox   ? Chronic back pain   ? COPD (chronic obstructive pulmonary disease) ([place])   ? Depression   ? Gallbladder problem   ? GERD (gastroesophageal reflux disease)   ? Hypertension   ? Hypothyroidism   ? Joint pain   ? Lactose intolerance   ? Migraines   ? Osteoarthritis   ? SOB (shortness of breath)   ? 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 Thyroid disease   ? Tremor   ? Vitamin D deficiency   ? ?Past Surgical History:  ?Procedure Laterality Date  ? ABDOMINAL HYSTERECTOMY  1978  ? CATARACT EXTRACTION Left 12/20/2017  ? will have the right one completed a month later   ? CHOLECYSTECTOMY  2000  ? COLONOSCOPY  2007  ? JOINT REPLACEMENT Right 08/19/2017  ? great toe   ? UPPER GASTROINTESTINAL ENDOSCOPY  [id number]  ? ? ?Allergies  ?Allergen Reactions  ? Hydrocodone Nausea Only  ?  Per patient   ? Meloxicam Other (See Comments)  ?  "skin infection on leg"  ? Sulfa Antibiotics Rash  ?  Rash all over body/fever  ? ? ?Family History  ?Problem Relation Age of Onset  ? Arthritis Mother   ? Diabetes Mother   ? COPD Mother   ? Heart failure Mother   ? Stroke Father   ? Hypertension Father   ? Hyperlipidemia Father   ? Diabetes  Father   ? CAD Father   ? Heart disease Father   ? Heart failure Father   ? Breast cancer Maternal Aunt   ? Cancer Sister   ?     bladder cancer   ? Throat cancer Brother   ? Colon cancer Neg Hx   ? Stomach cancer Neg Hx   ? Esophageal cancer Neg Hx   ? ? ?Social History  ? ?Socioeconomic History  ? Marital status: Single  ?  Spouse name: Not on file  ? Number of children: 0  ? Years of education: 66  ? Highest education level: Not on file  ?Occupational History  ? 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 Smoking status: Former  ?  Packs/day: 1.50  ?  Years: 25.00  ?  Pack years: 37.50  ?  Types: Cigarettes  ?  Quit date: 03/03/1990  ?  Years since quitting: 31.6  ? Smokeless tobacco: Never  ?Vaping Use  ? Vaping Use: Never used  ?Substance and Sexual Activity  ? Alcohol use: Yes  ?  Comment: glass of wine once a month  ? Drug use: No  ? Sexual activity: Never  ?Other Topics Concern  ? Not on file  ?Social History Narrative  ? Lives alone  ? Caffeine- tea, 1 cup  ? ?Social Determinants of Health  ? ?Financial Resource Strain: Low Risk   ? Difficulty of Paying Living Expenses: Not hard at all  ?Food  Insecurity: Not on file  ?Transportation Needs: Not on file  ?Physical Activity: Not on file  ?Stress: Not on file  ?Social Connections: Not on file  ? ?Vitals:  ? 10/26/21 1115  ?BP: 132/80  ?Pulse: 84  ?Resp:

## 2021-10-23 NOTE — Telephone Encounter (Signed)
Patient called in requesting a refill for ALPRAZolam (XANAX) 0.25 MG tablet [[id number]]  to be sent to her pharmacy. ? ?Please advise. ?

## 2021-10-26 ENCOUNTER — Ambulatory Visit (INDEPENDENT_AMBULATORY_CARE_PROVIDER_SITE_OTHER): Payer: PPO | Admitting: Family Medicine

## 2021-10-26 ENCOUNTER — Encounter: Payer: Self-pay | Admitting: Family Medicine

## 2021-10-26 VITALS — BP 132/80 | HR 84 | Resp 16 | Ht 62.0 in | Wt 184.2 lb

## 2021-10-26 DIAGNOSIS — F418 Other specified anxiety disorders: Secondary | ICD-10-CM

## 2021-10-26 DIAGNOSIS — E785 Hyperlipidemia, unspecified: Secondary | ICD-10-CM

## 2021-10-26 DIAGNOSIS — I10 Essential (primary) hypertension: Secondary | ICD-10-CM

## 2021-10-26 DIAGNOSIS — Z0000 Encounter for general adult medical examination without abnormal findings: Secondary | ICD-10-CM

## 2021-10-26 DIAGNOSIS — Z780 Asymptomatic menopausal state: Secondary | ICD-10-CM | POA: Diagnosis not present

## 2021-10-26 MED ORDER — ALPRAZOLAM 0.25 MG PO TABS: 0.2500 mg | ORAL_TABLET | Freq: Two times a day (BID) | ORAL | 3 refills | Status: DC | PRN

## 2021-10-26 MED ORDER — PROPRANOLOL HCL 40 MG PO TABS: 40.0000 mg | ORAL_TABLET | Freq: Two times a day (BID) | ORAL | 2 refills | Status: DC

## 2021-10-26 NOTE — Assessment & Plan Note (Addendum)
BP adequately controlled. ?Continue propranolol 40 mg twice daily. ?Continue low-salt diet. ?

## 2021-10-26 NOTE — Patient Instructions (Addendum)
A few things to remember from today's visit: ? ?Routine general medical examination at a health care facility ? ?Essential hypertension ? ?Asymptomatic postmenopausal estrogen deficiency - Plan: DG Bone Density ? ?If you need refills please call your pharmacy. ?Do not use My Chart to request refills or for acute issues that need immediate attention. ?  ?Please be sure medication list is accurate. ?If a new problem present, please set up appointment sooner than planned today. ? ?

## 2021-10-26 NOTE — Assessment & Plan Note (Signed)
Problem is stable. ?Continue paroxetine 10 mg daily and alprazolam 0.25 mg twice daily as needed. ?Prescription for alprazolam sent. ?Continue following every 6 months, before if needed. ?

## 2022-01-06 ENCOUNTER — Ambulatory Visit: Payer: PPO | Admitting: Dermatology

## 2022-01-06 DIAGNOSIS — L570 Actinic keratosis: Secondary | ICD-10-CM

## 2022-01-06 DIAGNOSIS — L578 Other skin changes due to chronic exposure to nonionizing radiation: Secondary | ICD-10-CM | POA: Diagnosis not present

## 2022-01-06 DIAGNOSIS — L820 Inflamed seborrheic keratosis: Secondary | ICD-10-CM | POA: Diagnosis not present

## 2022-01-06 DIAGNOSIS — L739 Follicular disorder, unspecified: Secondary | ICD-10-CM | POA: Diagnosis not present

## 2022-01-06 DIAGNOSIS — D485 Neoplasm of uncertain behavior of skin: Secondary | ICD-10-CM

## 2022-01-06 NOTE — Progress Notes (Signed)
Follow-Up Visit   Subjective  Kristina [name] is a 71 y.o. female who presents for the following: Spots (Right upper eyelid, present x 6 weeks, scaly. Similar spot on the left nose. Also a skin tag under the right eye she would like removed.).   The following portions of the chart were reviewed this encounter and updated as appropriate:       Review of Systems:  No other skin or systemic complaints except as noted in HPI or Assessment and Plan.  Objective  Well appearing patient in no apparent distress; mood and affect are within normal limits.  A focused examination was performed including face. Relevant physical exam findings are noted in the Assessment and Plan.  R lat inf eyebrow, R nasal root (2) Pink scaly macules.  Right Lower Eyelid 4.0 mm waxy pedunculated flesh papule   Right Malar Cheek 4.0 mm pink papule with telangiectasia        Assessment & Plan  Actinic Damage - chronic, secondary to cumulative UV radiation exposure/sun exposure over time - diffuse scaly erythematous macules with underlying dyspigmentation - Recommend daily broad spectrum sunscreen SPF 30+ to sun-exposed areas, reapply every 2 hours as needed.  - Recommend staying in the shade or wearing long sleeves, sun glasses (UVA+UVB protection) and wide brim hats (4-inch brim around the entire circumference of the hat). - Call for new or changing lesions.  AK (actinic keratosis) (2) R lat inf eyebrow, R nasal root  vs ISKs  Actinic keratoses are precancerous spots that appear secondary to cumulative UV radiation exposure/sun exposure over time. They are chronic with expected duration over 1 year. A portion of actinic keratoses will progress to squamous cell carcinoma of the skin. It is not possible to reliably predict which spots will progress to skin cancer and so treatment is recommended to prevent development of skin cancer.  Recommend daily broad spectrum sunscreen SPF 30+ to sun-exposed  areas, reapply every 2 hours as needed.  Recommend staying in the shade or wearing long sleeves, sun glasses (UVA+UVB protection) and wide brim hats (4-inch brim around the entire circumference of the hat). Call for new or changing lesions.  Destruction of lesion - R lat inf eyebrow, R nasal root  Destruction method: cryotherapy   Informed consent: discussed and consent obtained   Lesion destroyed using liquid nitrogen: Yes   Region frozen until ice ball extended beyond lesion: Yes   Outcome: patient tolerated procedure well with no complications   Post-procedure details: wound care instructions given   Additional details:  Prior to procedure, discussed risks of blister formation, small wound, skin dyspigmentation, or rare scar following cryotherapy. Recommend Vaseline ointment to treated areas while healing.   Neoplasm of uncertain behavior of skin (2) Right Lower Eyelid  Epidermal / dermal shaving  Lesion diameter (cm):  0.4 Informed consent: discussed and consent obtained   Anesthesia: the lesion was anesthetized in a standard fashion   Anesthetic:  1% lidocaine w/ epinephrine 1-100,000 buffered w/ 8.4% NaHCO3 Instrument used: scissors   Hemostasis achieved with: pressure, aluminum chloride and electrodesiccation   Outcome: patient tolerated procedure well   Post-procedure details: wound care instructions given   Post-procedure details comment:  Ointment and bandage applied  Specimen 1 - Surgical pathology Differential Diagnosis: Inflamed SK vs Irritated Skin Tag Check Margins: No 4.0 mm waxy pedunculated flesh papule   Right Malar Cheek  Skin / nail biopsy Type of biopsy: tangential   Informed consent: discussed and consent obtained  Patient was prepped and draped in usual sterile fashion: Area prepped with alcohol. Anesthesia: the lesion was anesthetized in a standard fashion   Anesthetic:  1% lidocaine w/ epinephrine 1-100,000 buffered w/ 8.4% NaHCO3 Instrument  used: flexible razor blade   Hemostasis achieved with: pressure, aluminum chloride and electrodesiccation   Outcome: patient tolerated procedure well   Post-procedure details: wound care instructions given   Post-procedure details comment:  Ointment and small bandage applied  Specimen 2 - Surgical pathology Differential Diagnosis: Telangiectasia r/o BCC Check Margins: No 4.0 mm pink papule with telangiectasia    Return if symptoms worsen or fail to improve, for pending biopsy.  IJamesetta Orleans, CMA, am acting as scribe for [name], MD . Documentation: I have reviewed the above documentation for accuracy and completeness, and I agree with the above.  [name] MD

## 2022-01-06 NOTE — Patient Instructions (Addendum)
Cryotherapy Aftercare  Wash gently with soap and water everyday.   Apply Vaseline and Band-Aid daily until healed.    Wound Care Instructions  Cleanse wound gently with soap and water once a day then pat dry with clean gauze. Apply a thing coat of Petrolatum (petroleum jelly, "Vaseline") over the wound (unless you have an allergy to this). We recommend that you use a new, sterile tube of Vaseline. Do not pick or remove scabs. Do not remove the yellow or white "healing tissue" from the base of the wound.  Cover the wound with fresh, clean, nonstick gauze and secure with paper tape. You may use Band-Aids in place of gauze and tape if the would is small enough, but would recommend trimming much of the tape off as there is often too much. Sometimes Band-Aids can irritate the skin.  You should call the office for your biopsy report after 1 week if you have not already been contacted.  If you experience any problems, such as abnormal amounts of bleeding, swelling, significant bruising, significant pain, or evidence of infection, please call the office immediately.  FOR ADULT SURGERY PATIENTS: If you need something for pain relief you may take 1 extra strength Tylenol (acetaminophen) AND 2 Ibuprofen (200mg each) together every 4 hours as needed for pain. (do not take these if you are allergic to them or if you have a reason you should not take them.) Typically, you may only need pain medication for 1 to 3 days.        If You Need Anything After Your Visit  If you have any questions or concerns for your doctor, please call our main line at [phone] and press option 4 to reach your doctor's medical assistant. If no one answers, please leave a voicemail as directed and we will return your call as soon as possible. Messages left after 4 pm will be answered the following business day.   You may also send us a message via MyChart. We typically respond to MyChart messages within 1-2 business  days.  For prescription refills, please ask your pharmacy to contact our office. Our fax number is [phone].  If you have an urgent issue when the clinic is closed that cannot wait until the next business day, you can page your doctor at the number below.    Please note that while we do our best to be available for urgent issues outside of office hours, we are not available 24/7.   If you have an urgent issue and are unable to reach us, you may choose to seek medical care at your doctor's office, retail clinic, urgent care center, or emergency room.  If you have a medical emergency, please immediately call 911 or go to the emergency department.  Pager Numbers  - Dr. Kowalski: [phone]  - Dr. Moye: [phone]  - Dr. Stewart: [phone]  In the event of inclement weather, please call our main line at [phone] for an update on the status of any delays or closures.  Dermatology Medication Tips: Please keep the boxes that topical medications come in in order to help keep track of the instructions about where and how to use these. Pharmacies typically print the medication instructions only on the boxes and not directly on the medication tubes.   If your medication is too expensive, please contact our office at [phone] option 4 or send us a message through MyChart.   We are unable to tell what your co-pay for medications will be   in advance as this is different depending on your insurance coverage. However, we may be able to find a substitute medication at lower cost or fill out paperwork to get insurance to cover a needed medication.   If a prior authorization is required to get your medication covered by your insurance company, please allow us 1-2 business days to complete this process.  Drug prices often vary depending on where the prescription is filled and some pharmacies may offer cheaper prices.  The website www.goodrx.com contains coupons for medications through  different pharmacies. The prices here do not account for what the cost may be with help from insurance (it may be cheaper with your insurance), but the website can give you the price if you did not use any insurance.  - You can print the associated coupon and take it with your prescription to the pharmacy.  - You may also stop by our office during regular business hours and pick up a GoodRx coupon card.  - If you need your prescription sent electronically to a different pharmacy, notify our office through [facility] MyChart or by phone at [phone] option 4.     Si Usted Necesita Algo Despus de Su Visita  Tambin puede enviarnos un mensaje a travs de MyChart. Por lo general respondemos a los mensajes de MyChart en el transcurso de 1 a 2 das hbiles.  Para renovar recetas, por favor pida a su farmacia que se ponga en contacto con nuestra oficina. Nuestro nmero de fax es el [phone].  Si tiene un asunto urgente cuando la clnica est cerrada y que no puede esperar hasta el siguiente da hbil, puede llamar/localizar a su doctor(a) al nmero que aparece a continuacin.   Por favor, tenga en cuenta que aunque hacemos todo lo posible para estar disponibles para asuntos urgentes fuera del horario de oficina, no estamos disponibles las 24 horas del da, los 7 das de la semana.   Si tiene un problema urgente y no puede comunicarse con nosotros, puede optar por buscar atencin mdica  en el consultorio de su doctor(a), en una clnica privada, en un centro de atencin urgente o en una sala de emergencias.  Si tiene una emergencia mdica, por favor llame inmediatamente al 911 o vaya a la sala de emergencias.  Nmeros de bper  - Dr. Kowalski: [phone]  - Dra. Moye: [phone]  - Dra. Stewart: [phone]  En caso de inclemencias del tiempo, por favor llame a nuestra lnea principal al [phone] para una actualizacin sobre el estado de cualquier retraso o cierre.  Consejos  para la medicacin en dermatologa: Por favor, guarde las cajas en las que vienen los medicamentos de uso tpico para ayudarle a seguir las instrucciones sobre dnde y cmo usarlos. Las farmacias generalmente imprimen las instrucciones del medicamento slo en las cajas y no directamente en los tubos del medicamento.   Si su medicamento es muy caro, por favor, pngase en contacto con nuestra oficina llamando al [phone] y presione la opcin 4 o envenos un mensaje a travs de MyChart.   No podemos decirle cul ser su copago por los medicamentos por adelantado ya que esto es diferente dependiendo de la cobertura de su seguro. Sin embargo, es posible que podamos encontrar un medicamento sustituto a menor costo o llenar un formulario para que el seguro cubra el medicamento que se considera necesario.   Si se requiere una autorizacin previa para que su compaa de seguros cubra su medicamento, por favor permtanos de 1 a   2 das hbiles para completar este proceso.  Los precios de los medicamentos varan con frecuencia dependiendo del lugar de dnde se surte la receta y alguna farmacias pueden ofrecer precios ms baratos.  El sitio web www.goodrx.com tiene cupones para medicamentos de diferentes farmacias. Los precios aqu no tienen en cuenta lo que podra costar con la ayuda del seguro (puede ser ms barato con su seguro), pero el sitio web puede darle el precio si no utiliz ningn seguro.  - Puede imprimir el cupn correspondiente y llevarlo con su receta a la farmacia.  - Tambin puede pasar por nuestra oficina durante el horario de atencin regular y recoger una tarjeta de cupones de GoodRx.  - Si necesita que su receta se enve electrnicamente a una farmacia diferente, informe a nuestra oficina a travs de MyChart de  o por telfono llamando al [phone] y presione la opcin 4.  

## 2022-01-11 ENCOUNTER — Telehealth: Payer: Self-pay

## 2022-01-11 NOTE — Telephone Encounter (Signed)
-----   Message from [name], MD sent at 01/11/2022 11:21 AM EDT ----- 1. Skin , right lower eyelid SEBORRHEIC KERATOSIS, IRRITATED 2. Skin , right malar cheek CHRONIC FOLLICULITIS  1. Benign irritated SK 2. Benign inflammation of skin follicle   - please call patient

## 2022-01-11 NOTE — Telephone Encounter (Signed)
Advised pt of bx results/sh ?

## 2022-02-09 IMAGING — MG DIGITAL SCREENING BILAT W/ CAD
5 series · 5 of 5 positions shown · non-contrast
Comparison: Previous exam(s).

CLINICAL DATA: Screening.

EXAM:
DIGITAL SCREENING BILATERAL MAMMOGRAM WITH CAD

[R MLO]
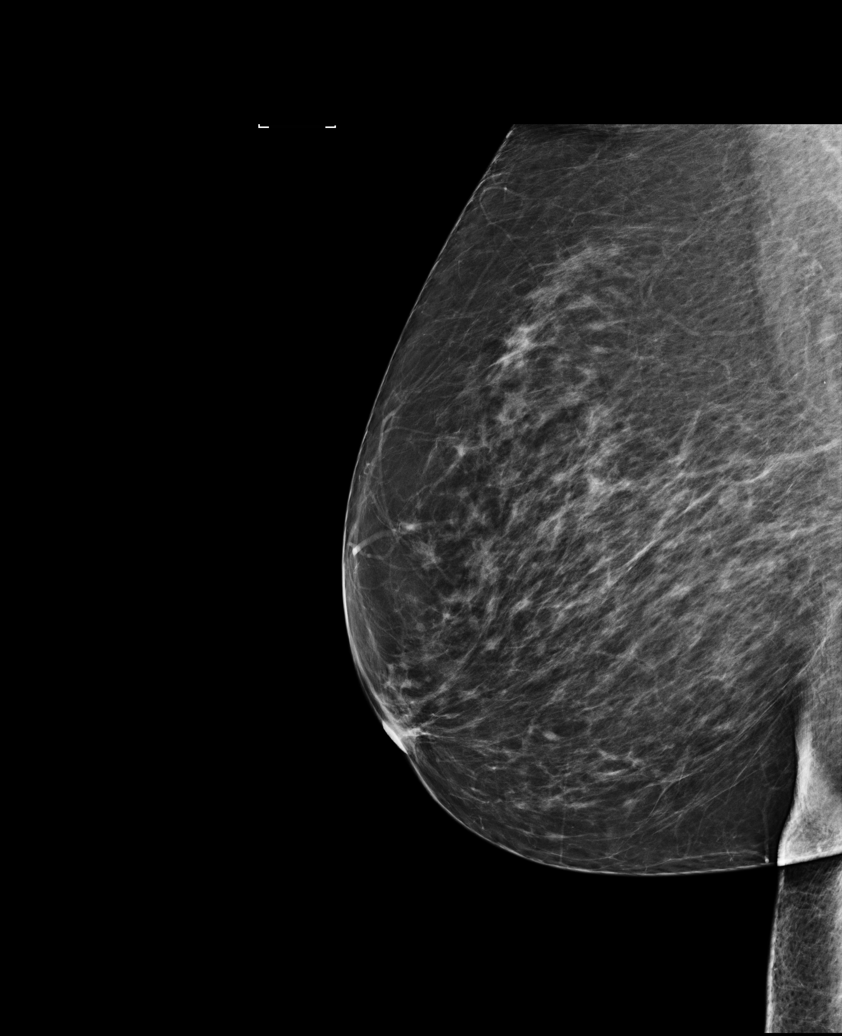

[L MLO (1 of 2)]
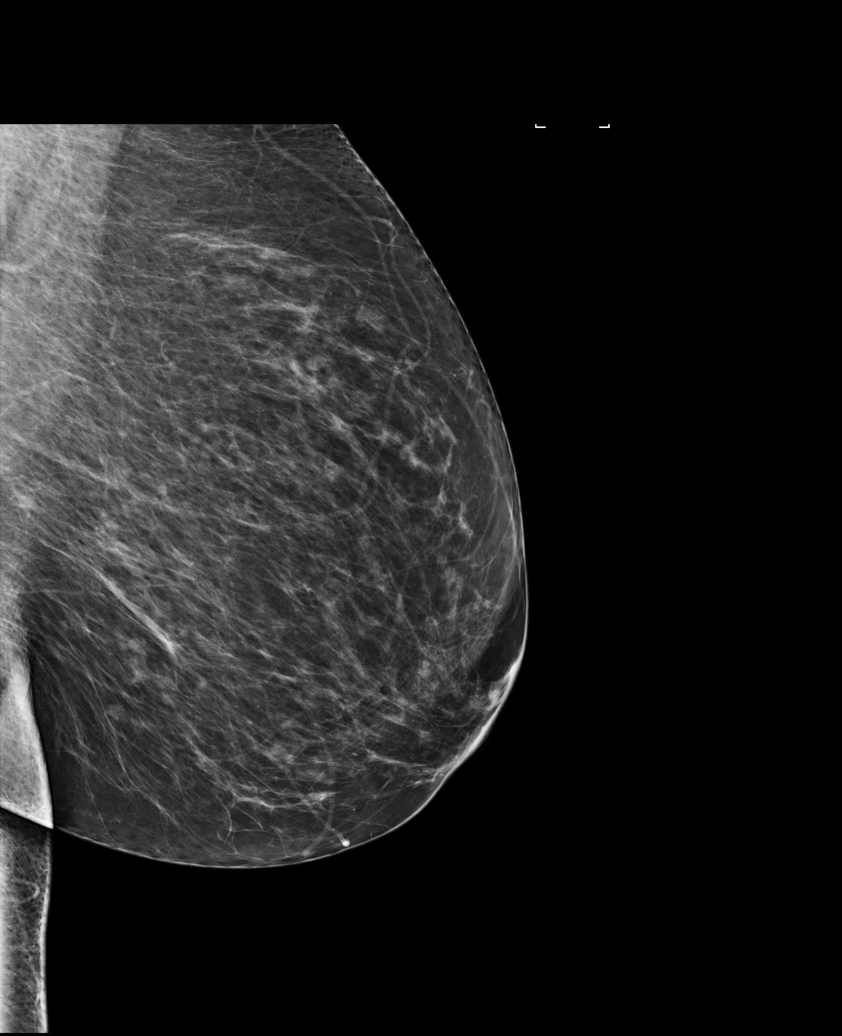

[L CC]
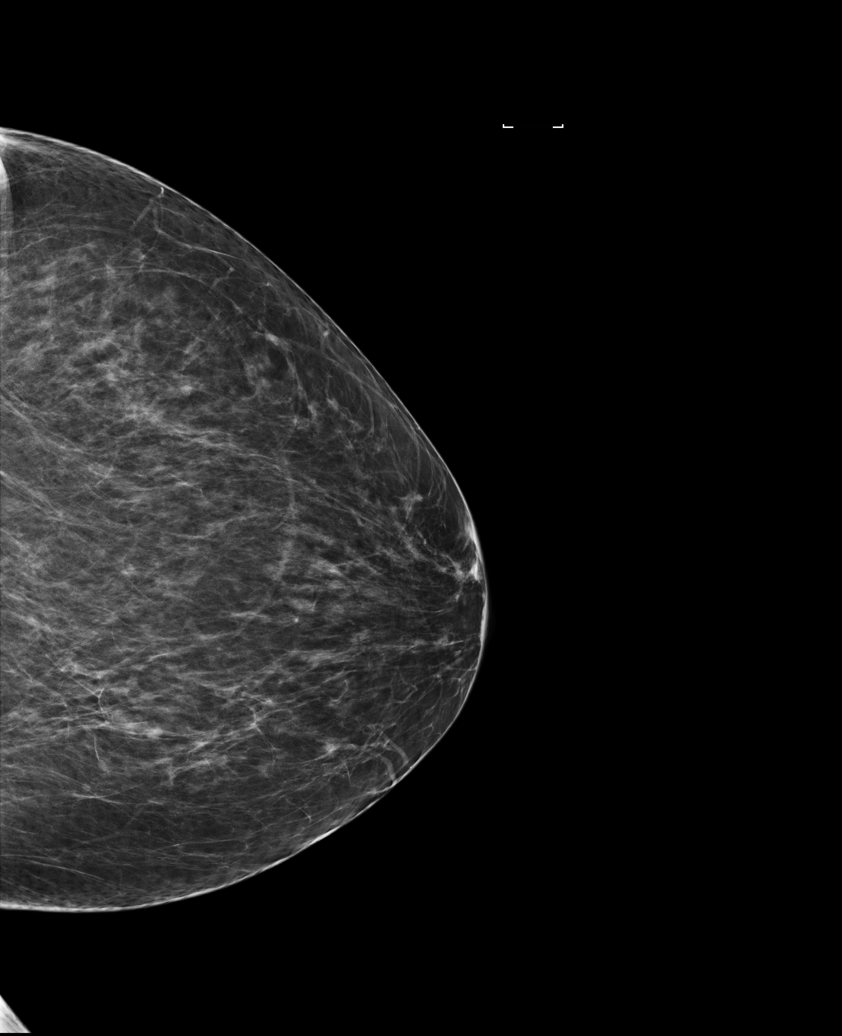

[L MLO (2 of 2)]
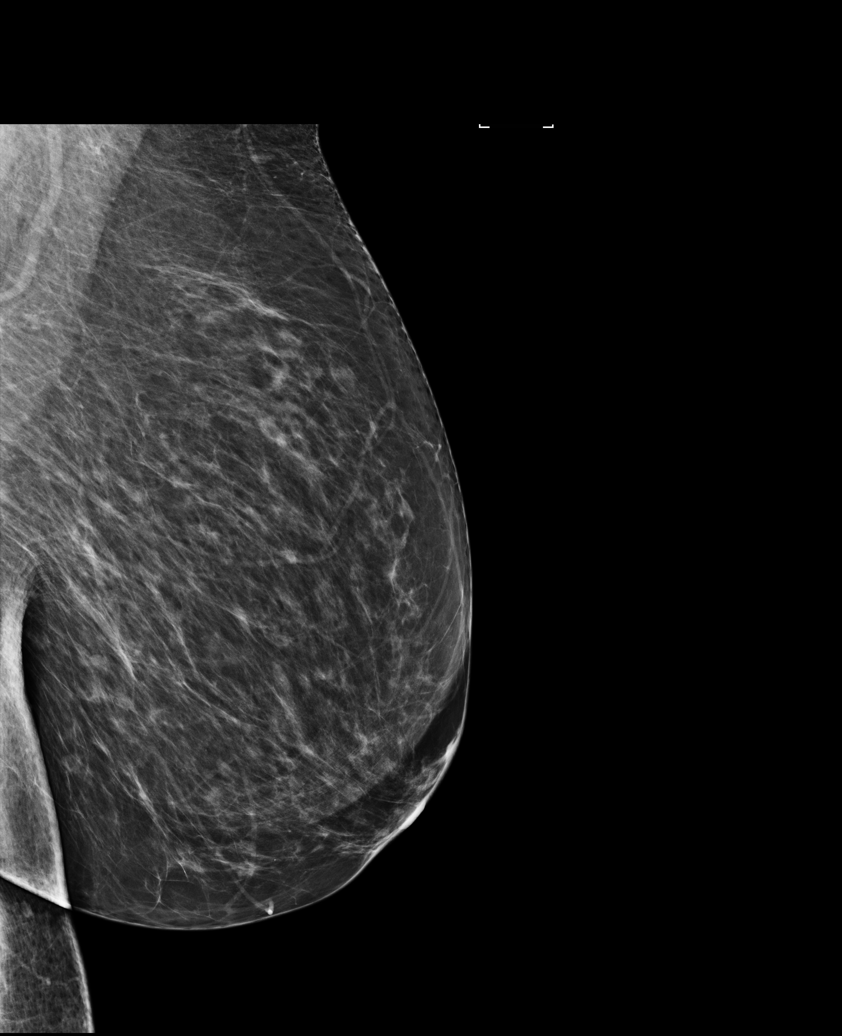

[R CC]
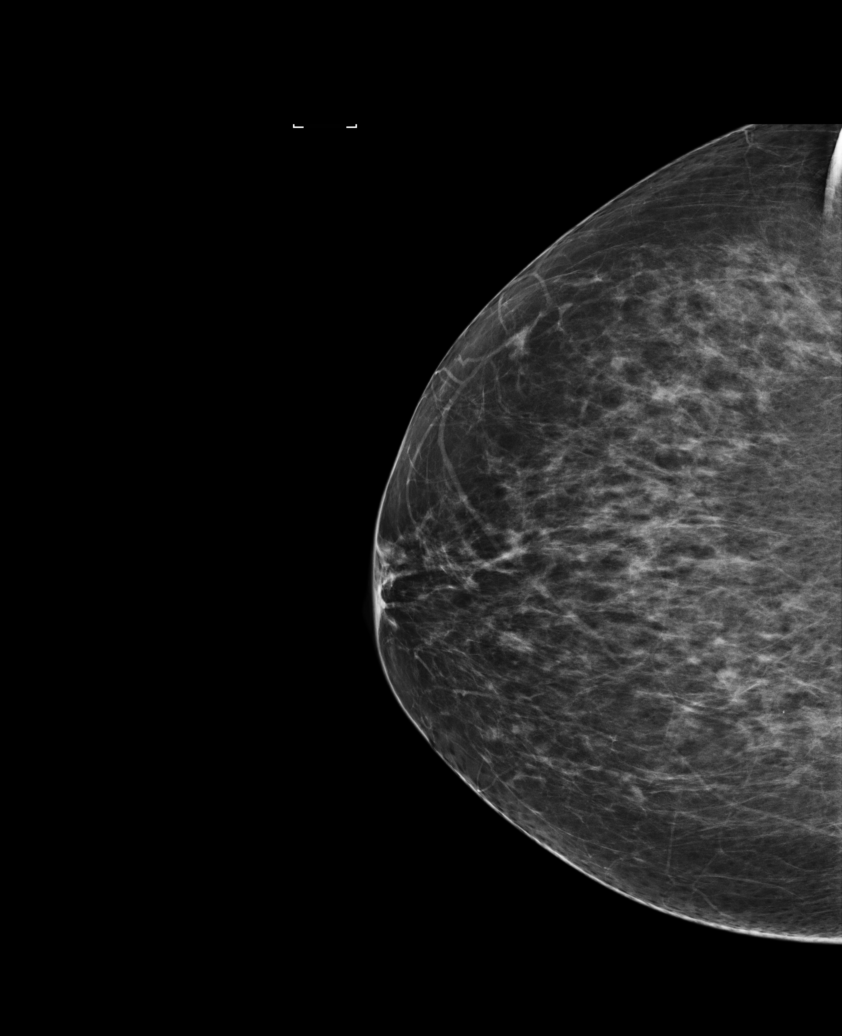

[5 of 5 positions shown; findings below may reference images not displayed]

ACR Breast Density Category b: There are scattered areas of
fibroglandular density.
FINDINGS: There are no findings suspicious for malignancy. Images were
processed with CAD.
IMPRESSION: No mammographic evidence of malignancy. A result letter of this
screening mammogram will be mailed directly to the patient.

RECOMMENDATION:
Screening mammogram in one year. (Code:AS-G-LCT)

BI-RADS CATEGORY  1: Negative.

## 2022-02-15 DIAGNOSIS — D101 Benign neoplasm of tongue: Secondary | ICD-10-CM | POA: Diagnosis not present

## 2022-03-11 DIAGNOSIS — H43813 Vitreous degeneration, bilateral: Secondary | ICD-10-CM | POA: Diagnosis not present

## 2022-03-11 DIAGNOSIS — H26493 Other secondary cataract, bilateral: Secondary | ICD-10-CM | POA: Diagnosis not present

## 2022-03-11 DIAGNOSIS — H524 Presbyopia: Secondary | ICD-10-CM | POA: Diagnosis not present

## 2022-03-11 DIAGNOSIS — H04123 Dry eye syndrome of bilateral lacrimal glands: Secondary | ICD-10-CM | POA: Diagnosis not present

## 2022-03-11 DIAGNOSIS — H35013 Changes in retinal vascular appearance, bilateral: Secondary | ICD-10-CM | POA: Diagnosis not present

## 2022-03-17 ENCOUNTER — Encounter (INDEPENDENT_AMBULATORY_CARE_PROVIDER_SITE_OTHER): Payer: Self-pay

## 2022-03-24 ENCOUNTER — Other Ambulatory Visit: Payer: Self-pay | Admitting: Family Medicine

## 2022-03-24 DIAGNOSIS — G4700 Insomnia, unspecified: Secondary | ICD-10-CM

## 2022-03-24 NOTE — Telephone Encounter (Signed)
Last filled 02/16/22, Next office visit 04/28/22 Can you advise in pcp's absence?

## 2022-03-26 ENCOUNTER — Other Ambulatory Visit: Payer: Self-pay | Admitting: Family Medicine

## 2022-03-26 DIAGNOSIS — E038 Other specified hypothyroidism: Secondary | ICD-10-CM

## 2022-03-27 ENCOUNTER — Other Ambulatory Visit: Payer: Self-pay | Admitting: Family Medicine

## 2022-03-27 DIAGNOSIS — F418 Other specified anxiety disorders: Secondary | ICD-10-CM

## 2022-03-27 DIAGNOSIS — N951 Menopausal and female climacteric states: Secondary | ICD-10-CM

## 2022-03-29 ENCOUNTER — Other Ambulatory Visit: Payer: Self-pay | Admitting: Family Medicine

## 2022-03-29 DIAGNOSIS — F418 Other specified anxiety disorders: Secondary | ICD-10-CM

## 2022-03-29 DIAGNOSIS — N951 Menopausal and female climacteric states: Secondary | ICD-10-CM

## 2022-03-30 NOTE — Progress Notes (Unsigned)
ACUTE VISIT No chief complaint on file.  HPI: Kristina [name] is a 71 y.o. female, who is here today complaining of *** HPI  Review of Systems Rest see pertinent positives and negatives per HPI.  Current Outpatient Medications on File Prior to Visit  Medication Sig Dispense Refill   Acetaminophen (TYLENOL PO) Take 500 mg by mouth 2 (two) times daily.     albuterol (PROVENTIL HFA;VENTOLIN HFA) 108 (90 Base) MCG/ACT inhaler Inhale 2 puffs into the lungs every 6 (six) hours as needed for wheezing or shortness of breath. 1 Inhaler 2   ALPRAZolam (XANAX) 0.25 MG tablet Take 1 tablet by mouth twice daily as needed for anxiety 45 tablet 0   atorvastatin (LIPITOR) 10 MG tablet Take 1 tablet (10 mg total) by mouth daily. 90 tablet 3   cyclobenzaprine (FLEXERIL) 5 MG tablet TAKE 1 TABLET BY MOUTH AT BEDTIME 90 tablet 0   estradiol (VIVELLE-DOT) 0.0375 MG/24HR APPLY 1 PATCH TOPICALLY TWICE A WEEK 8 patch 11   levothyroxine (SYNTHROID) 25 MCG tablet TAKE 1 TABLET BY MOUTH ONCE DAILY BEFORE BREAKFAST 90 tablet 2   Magnesium 250 MG TABS Take by mouth daily.     meclizine (ANTIVERT) 12.5 MG tablet Take 1 tablet (12.5 mg total) by mouth 3 (three) times daily as needed for dizziness. 60 tablet 1   Multiple Vitamin (MULTIVITAMIN) tablet Take 1 tablet by mouth daily.     pantoprazole (PROTONIX) 40 MG tablet Take 1 tablet (40 mg total) by mouth daily. 90 tablet 2   PARoxetine (PAXIL) 10 MG tablet TAKE ONE TABLET BY MOUTH ONE TIME DAILY 90 tablet 1   propranolol (INDERAL) 40 MG tablet Take 1 tablet (40 mg total) by mouth 2 (two) times daily. 180 tablet 2   RESTASIS 0.05 % ophthalmic emulsion Place into both eyes 2 (two) times daily.     VOLTAREN 1 % GEL SMARTSIG:1 Sparingly Topical PRN     No current facility-administered medications on file prior to visit.     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, Mohs   Thyroid disease    Tremor    Vitamin D deficiency    Allergies  Allergen Reactions   Hydrocodone Nausea Only    Per patient    Meloxicam Other (See Comments)    "skin infection on leg"   Sulfa Antibiotics Rash    Rash all over body/fever    Social History   Socioeconomic History   Marital status: Single    Spouse name: Not on file   Number of children: 0   Years of education: 12   Highest education level: Some college, no degree  Occupational History   Occupation: retired    Comment: administration  Tobacco Use   Smoking status: Former    Packs/day: 1.50    Years: 25.00    Total pack  years: 37.50    Types: Cigarettes    Quit date: 03/03/1990    Years since quitting: 32.0   Smokeless tobacco: Never  Vaping Use   Vaping Use: Never used  Substance and Sexual Activity   Alcohol use: Yes    Comment: glass of wine once a month   Drug use: No   Sexual activity: Never  Other Topics Concern   Not on file  Social History Narrative   Lives alone   Caffeine- tea, 1 cup   Social Determinants of Health   Financial Resource Strain: Low Risk  (03/29/2022)   Overall Financial Resource Strain (CARDIA)    Difficulty of Paying Living Expenses: Not very hard  Food Insecurity: No Food Insecurity (03/29/2022)   Hunger Vital Sign    Worried About Running Out of Food in the Last Year: Never true    Ran Out of Food in the Last Year: Never true  Transportation Needs: No Transportation Needs (03/29/2022)   PRAPARE - Hydrologist (Medical): No    Lack of Transportation (Non-Medical): No   Physical Activity: Sufficiently Active (03/29/2022)   Exercise Vital Sign    Days of Exercise per Week: 3 days    Minutes of Exercise per Session: 60 min  Stress: Stress Concern Present (03/29/2022)   [place]    Feeling of Stress : Very much  Social Connections: Socially Isolated (03/29/2022)   Social Connection and Isolation Panel [NHANES]    Frequency of Communication with Friends and Family: More than three times a week    Frequency of Social Gatherings with Friends and Family: Once a week    Attends Religious Services: Never    Marine scientist or Organizations: No    Attends Music therapist: Not on file    Marital Status: Divorced    There were no vitals filed for this visit. There is no height or weight on file to calculate BMI.  Physical Exam  ASSESSMENT AND PLAN:  There are no diagnoses linked to this encounter.   No follow-ups on file.   Yoshie Kosel G. [place], MD  [facility]. [place] office.  Discharge Instructions   None

## 2022-03-31 ENCOUNTER — Ambulatory Visit (INDEPENDENT_AMBULATORY_CARE_PROVIDER_SITE_OTHER): Payer: PPO | Admitting: Family Medicine

## 2022-03-31 ENCOUNTER — Encounter: Payer: Self-pay | Admitting: Family Medicine

## 2022-03-31 ENCOUNTER — Other Ambulatory Visit (INDEPENDENT_AMBULATORY_CARE_PROVIDER_SITE_OTHER): Payer: PPO

## 2022-03-31 VITALS — BP 120/80 | HR 72 | Temp 98.1°F | Resp 16 | Ht 62.0 in | Wt 184.4 lb

## 2022-03-31 DIAGNOSIS — M545 Low back pain, unspecified: Secondary | ICD-10-CM

## 2022-03-31 DIAGNOSIS — I10 Essential (primary) hypertension: Secondary | ICD-10-CM

## 2022-03-31 DIAGNOSIS — R110 Nausea: Secondary | ICD-10-CM

## 2022-03-31 DIAGNOSIS — G8929 Other chronic pain: Secondary | ICD-10-CM

## 2022-03-31 DIAGNOSIS — H8110 Benign paroxysmal vertigo, unspecified ear: Secondary | ICD-10-CM

## 2022-03-31 DIAGNOSIS — N951 Menopausal and female climacteric states: Secondary | ICD-10-CM

## 2022-03-31 DIAGNOSIS — E538 Deficiency of other specified B group vitamins: Secondary | ICD-10-CM

## 2022-03-31 DIAGNOSIS — F418 Other specified anxiety disorders: Secondary | ICD-10-CM

## 2022-03-31 DIAGNOSIS — E041 Nontoxic single thyroid nodule: Secondary | ICD-10-CM

## 2022-03-31 DIAGNOSIS — R7303 Prediabetes: Secondary | ICD-10-CM | POA: Diagnosis not present

## 2022-03-31 DIAGNOSIS — R0989 Other specified symptoms and signs involving the circulatory and respiratory systems: Secondary | ICD-10-CM | POA: Diagnosis not present

## 2022-03-31 DIAGNOSIS — H539 Unspecified visual disturbance: Secondary | ICD-10-CM

## 2022-03-31 LAB — COMPREHENSIVE METABOLIC PANEL
ALT: 23 U/L (ref 0–35)
AST: 21 U/L (ref 0–37)
Albumin: 3.9 g/dL (ref 3.5–5.2)
Alkaline Phosphatase: 67 U/L (ref 39–117)
BUN: 16 mg/dL (ref 6–23)
CO2: 26 mEq/L (ref 19–32)
Calcium: 9 mg/dL (ref 8.4–10.5)
Chloride: 100 mEq/L (ref 96–112)
Creatinine, Ser: 0.91 mg/dL (ref 0.40–1.20)
GFR: 63.33 mL/min (ref >60.00)
Glucose, Bld: 100 mg/dL — ABNORMAL HIGH (ref 70–99)
Potassium: 3.9 mEq/L (ref 3.5–5.1)
Sodium: 136 mEq/L (ref 135–145)
Total Bilirubin: 0.5 mg/dL (ref 0.2–1.2)
Total Protein: 6.6 g/dL (ref 6.0–8.3)

## 2022-03-31 LAB — CBC
HCT: 38.9 % (ref 36.0–46.0)
Hemoglobin: 13.1 g/dL (ref 12.0–15.0)
MCHC: 33.6 g/dL (ref 30.0–36.0)
MCV: 92.1 fl (ref 78.0–100.0)
Platelets: 238 10*3/uL (ref 150.0–400.0)
RBC: 4.22 Mil/uL (ref 3.87–5.11)
RDW: 12.8 % (ref 11.5–15.5)
WBC: 8.2 10*3/uL (ref 4.0–10.5)

## 2022-03-31 LAB — VITAMIN B12: Vitamin B-12: 785 pg/mL (ref 211–911)

## 2022-03-31 LAB — TSH: TSH: 1.36 u[IU]/mL (ref 0.35–5.50)

## 2022-03-31 LAB — HEMOGLOBIN A1C: Hgb A1c MFr Bld: 6 % (ref 4.6–6.5)

## 2022-03-31 MED ORDER — ONDANSETRON HCL 4 MG PO TABS: 4.0000 mg | ORAL_TABLET | Freq: Every day | ORAL | 0 refills | Status: AC | PRN

## 2022-03-31 MED ORDER — CYCLOBENZAPRINE HCL 5 MG PO TABS: 5.0000 mg | ORAL_TABLET | Freq: Every day | ORAL | 1 refills | Status: DC | PRN

## 2022-03-31 MED ORDER — MECLIZINE HCL 12.5 MG PO TABS: 12.5000 mg | ORAL_TABLET | Freq: Three times a day (TID) | ORAL | 1 refills | Status: AC | PRN

## 2022-03-31 MED ORDER — PAROXETINE HCL 10 MG PO TABS: 15.0000 mg | ORAL_TABLET | Freq: Every day | ORAL | 1 refills | Status: DC

## 2022-03-31 NOTE — Assessment & Plan Note (Signed)
Problem is stable. Continue Flexeril 5 mg daily as needed, we discussed some side effects.

## 2022-03-31 NOTE — Assessment & Plan Note (Signed)
BP adequately controlled. Continue propranolol 40 mg twice daily. Low-salt/DASH diet to continue. Monitor BP regularly.

## 2022-03-31 NOTE — Assessment & Plan Note (Signed)
Continue a healthy lifestyle for diabetes prevention. Further recommendation will be given according to hemoglobin A1c result. 

## 2022-03-31 NOTE — Patient Instructions (Addendum)
A few things to remember from today's visit:   Essential hypertension - Plan: Comprehensive metabolic panel  Thyroid nodule - Plan: TSH  Prediabetes - Plan: Comprehensive metabolic panel, Hemoglobin A1c  Hyperlipidemia, unspecified hyperlipidemia type  B12 deficiency  Nausea without vomiting - Plan: CBC  Chronic low back pain, unspecified back pain laterality, unspecified whether sciatica present - Plan: cyclobenzaprine (FLEXERIL) 5 MG tablet  Visual disturbance  If you need refills please call your pharmacy. Do not use My Chart to request refills or for acute issues that need immediate attention.  ? Ophthalmic migraine. Please arrange appt with ophthalmologist. Monitor for new symptoms. Try meclizine nightly for 5 days.  Increase paroxetine from 10 mg to 15 mg (1.5 tab). No changes in rest.   Please be sure medication list is accurate. If a new problem present, please set up appointment sooner than planned today.

## 2022-03-31 NOTE — Assessment & Plan Note (Signed)
Problem has been stable. Continue levothyroxine 25 mcg daily. Further recommendation will be given according to TSH result. Thyroid US ordered to complete 5 years of f/u.

## 2022-03-31 NOTE — Assessment & Plan Note (Signed)
This is a chronic problem, she is not having more symptoms than usual. Continue meclizine 12.5 mg daily as needed, son side effect discussed.

## 2022-03-31 NOTE — Assessment & Plan Note (Signed)
This problem could be aggravating some of her symptoms. Recommend increasing dose of paroxetine from 10 mg to 15 mg. Continue alprazolam 0.25 mg twice daily as needed. Follow-up in 2 months, before if needed.

## 2022-04-01 ENCOUNTER — Telehealth: Payer: Self-pay | Admitting: Family Medicine

## 2022-04-01 NOTE — Telephone Encounter (Signed)
Error. Please disregard

## 2022-04-02 ENCOUNTER — Ambulatory Visit
Admission: RE | Admit: 2022-04-02 | Discharge: 2022-04-02 | Disposition: A | Discharge status: Home / Self Care | Payer: PPO | Source: Ambulatory Visit | Attending: Family Medicine | Admitting: Family Medicine

## 2022-04-02 DIAGNOSIS — E041 Nontoxic single thyroid nodule: Secondary | ICD-10-CM

## 2022-04-02 DIAGNOSIS — E042 Nontoxic multinodular goiter: Secondary | ICD-10-CM | POA: Diagnosis not present

## 2022-04-05 ENCOUNTER — Ambulatory Visit (HOSPITAL_COMMUNITY)
Admission: RE | Admit: 2022-04-05 | Discharge: 2022-04-05 | Disposition: A | Discharge status: Home / Self Care | Payer: PPO | Source: Ambulatory Visit | Attending: Cardiology | Admitting: Cardiology

## 2022-04-05 DIAGNOSIS — R0989 Other specified symptoms and signs involving the circulatory and respiratory systems: Secondary | ICD-10-CM | POA: Diagnosis not present

## 2022-04-07 ENCOUNTER — Other Ambulatory Visit
Admission: RE | Admit: 2022-04-07 | Discharge: 2022-04-07 | Disposition: A | Discharge status: Home / Self Care | Payer: PPO | Source: Ambulatory Visit | Attending: Ophthalmology | Admitting: Ophthalmology

## 2022-04-07 ENCOUNTER — Other Ambulatory Visit: Payer: Self-pay | Admitting: Ophthalmology

## 2022-04-07 DIAGNOSIS — R110 Nausea: Secondary | ICD-10-CM | POA: Diagnosis not present

## 2022-04-07 DIAGNOSIS — H547 Unspecified visual loss: Secondary | ICD-10-CM

## 2022-04-07 DIAGNOSIS — E041 Nontoxic single thyroid nodule: Secondary | ICD-10-CM | POA: Insufficient documentation

## 2022-04-07 DIAGNOSIS — G43109 Migraine with aura, not intractable, without status migrainosus: Secondary | ICD-10-CM | POA: Diagnosis not present

## 2022-04-07 LAB — CBC
HCT: 40.2 % (ref 36.0–46.0)
Hemoglobin: 13.2 g/dL (ref 12.0–15.0)
MCH: 30.2 pg (ref 26.0–34.0)
MCHC: 32.8 g/dL (ref 30.0–36.0)
MCV: 92 fL (ref 80.0–100.0)
Platelets: 259 10*3/uL (ref 150–400)
RBC: 4.37 MIL/uL (ref 3.87–5.11)
RDW: 12.8 % (ref 11.5–15.5)
WBC: 8.6 10*3/uL (ref 4.0–10.5)
nRBC: 0 % (ref 0.0–0.2)

## 2022-04-07 LAB — SEDIMENTATION RATE: Sed Rate: 28 mm/hr (ref 0–30)

## 2022-04-07 LAB — C-REACTIVE PROTEIN: CRP: 1.1 mg/dL — ABNORMAL HIGH (ref <1.0)

## 2022-04-15 ENCOUNTER — Ambulatory Visit
Admission: RE | Admit: 2022-04-15 | Discharge: 2022-04-15 | Disposition: A | Discharge status: Home / Self Care | Payer: PPO | Source: Ambulatory Visit | Attending: Family Medicine | Admitting: Family Medicine

## 2022-04-15 ENCOUNTER — Ambulatory Visit
Admission: RE | Admit: 2022-04-15 | Discharge: 2022-04-15 | Disposition: A | Discharge status: Home / Self Care | Payer: PPO | Source: Ambulatory Visit | Attending: Advanced Practice Midwife | Admitting: Advanced Practice Midwife

## 2022-04-15 DIAGNOSIS — M85851 Other specified disorders of bone density and structure, right thigh: Secondary | ICD-10-CM | POA: Diagnosis not present

## 2022-04-15 DIAGNOSIS — Z0000 Encounter for general adult medical examination without abnormal findings: Secondary | ICD-10-CM

## 2022-04-15 DIAGNOSIS — Z780 Asymptomatic menopausal state: Secondary | ICD-10-CM | POA: Diagnosis not present

## 2022-04-15 DIAGNOSIS — Z1239 Encounter for other screening for malignant neoplasm of breast: Secondary | ICD-10-CM

## 2022-04-15 DIAGNOSIS — Z1231 Encounter for screening mammogram for malignant neoplasm of breast: Secondary | ICD-10-CM | POA: Diagnosis not present

## 2022-04-16 ENCOUNTER — Ambulatory Visit
Admission: RE | Admit: 2022-04-16 | Discharge: 2022-04-16 | Disposition: A | Discharge status: Home / Self Care | Payer: PPO | Source: Ambulatory Visit | Attending: Ophthalmology | Admitting: Ophthalmology

## 2022-04-16 DIAGNOSIS — R519 Headache, unspecified: Secondary | ICD-10-CM | POA: Diagnosis not present

## 2022-04-16 DIAGNOSIS — H547 Unspecified visual loss: Secondary | ICD-10-CM | POA: Insufficient documentation

## 2022-04-16 DIAGNOSIS — G43109 Migraine with aura, not intractable, without status migrainosus: Secondary | ICD-10-CM | POA: Diagnosis not present

## 2022-04-16 DIAGNOSIS — H538 Other visual disturbances: Secondary | ICD-10-CM | POA: Diagnosis not present

## 2022-04-16 DIAGNOSIS — R42 Dizziness and giddiness: Secondary | ICD-10-CM | POA: Diagnosis not present

## 2022-04-16 MED ORDER — GADOBUTROL 1 MMOL/ML IV SOLN
7.5000 mL | Freq: Once | INTRAVENOUS | Status: AC | PRN
  Administered 2022-04-16: 7.5 mL via INTRAVENOUS

## 2022-04-19 ENCOUNTER — Other Ambulatory Visit: Payer: Self-pay | Admitting: Advanced Practice Midwife

## 2022-04-19 DIAGNOSIS — R928 Other abnormal and inconclusive findings on diagnostic imaging of breast: Secondary | ICD-10-CM

## 2022-04-28 ENCOUNTER — Ambulatory Visit
Admission: RE | Admit: 2022-04-28 | Discharge: 2022-04-28 | Disposition: A | Discharge status: Home / Self Care | Payer: PPO | Source: Ambulatory Visit | Attending: Advanced Practice Midwife | Admitting: Advanced Practice Midwife

## 2022-04-28 ENCOUNTER — Ambulatory Visit: Payer: PPO | Admitting: Family Medicine

## 2022-04-28 ENCOUNTER — Other Ambulatory Visit: Payer: Self-pay | Admitting: Advanced Practice Midwife

## 2022-04-28 DIAGNOSIS — R928 Other abnormal and inconclusive findings on diagnostic imaging of breast: Secondary | ICD-10-CM

## 2022-04-28 DIAGNOSIS — N6310 Unspecified lump in the right breast, unspecified quadrant: Secondary | ICD-10-CM

## 2022-04-28 DIAGNOSIS — N6489 Other specified disorders of breast: Secondary | ICD-10-CM | POA: Diagnosis not present

## 2022-04-30 ENCOUNTER — Ambulatory Visit
Admission: RE | Admit: 2022-04-30 | Discharge: 2022-04-30 | Disposition: A | Discharge status: Home / Self Care | Payer: PPO | Source: Ambulatory Visit | Attending: Advanced Practice Midwife | Admitting: Advanced Practice Midwife

## 2022-04-30 ENCOUNTER — Other Ambulatory Visit: Payer: Self-pay | Admitting: Radiology

## 2022-04-30 DIAGNOSIS — C50811 Malignant neoplasm of overlapping sites of right female breast: Secondary | ICD-10-CM | POA: Diagnosis not present

## 2022-04-30 DIAGNOSIS — N6315 Unspecified lump in the right breast, overlapping quadrants: Secondary | ICD-10-CM | POA: Diagnosis not present

## 2022-04-30 DIAGNOSIS — N6310 Unspecified lump in the right breast, unspecified quadrant: Secondary | ICD-10-CM

## 2022-05-03 ENCOUNTER — Encounter: Payer: Self-pay | Admitting: *Deleted

## 2022-05-03 DIAGNOSIS — C50919 Malignant neoplasm of unspecified site of unspecified female breast: Secondary | ICD-10-CM

## 2022-05-03 DIAGNOSIS — G43109 Migraine with aura, not intractable, without status migrainosus: Secondary | ICD-10-CM | POA: Diagnosis not present

## 2022-05-03 NOTE — Progress Notes (Signed)
Kristina [name] would like to see a surgeon at [facility] surgical, referral has been sent.   She will see Dr. Tasia Catchings on 9/29 at 12:30.   Appt. Details have been given.

## 2022-05-03 NOTE — Progress Notes (Signed)
Received referral for newly diagnosed breast cancer from [facility].  Navigation initiated.  I gave her the names of surgeons and medical oncologists in [place], she will let me know who she would like to see.

## 2022-05-05 ENCOUNTER — Other Ambulatory Visit: Payer: Self-pay

## 2022-05-05 DIAGNOSIS — E7849 Other hyperlipidemia: Secondary | ICD-10-CM

## 2022-05-05 MED ORDER — ATORVASTATIN CALCIUM 10 MG PO TABS: 10.0000 mg | ORAL_TABLET | Freq: Every day | ORAL | 3 refills | Status: DC

## 2022-05-06 ENCOUNTER — Ambulatory Visit: Payer: PPO | Admitting: Surgery

## 2022-05-06 ENCOUNTER — Encounter: Payer: Self-pay | Admitting: Surgery

## 2022-05-06 VITALS — BP 136/83 | HR 73 | Temp 98.2°F | Ht 61.75 in | Wt 185.2 lb

## 2022-05-06 DIAGNOSIS — C50211 Malignant neoplasm of upper-inner quadrant of right female breast: Secondary | ICD-10-CM

## 2022-05-06 DIAGNOSIS — Z170 Estrogen receptor positive status [ER+]: Secondary | ICD-10-CM

## 2022-05-06 NOTE — H&P (View-Only) (Signed)
Patient ID: Kristina [name], female   DOB: 1951-06-28, 71 y.o.   MRN: [id number]  Chief Complaint: Right breast cancer  History of Present Illness Kristina [name] is a 71 y.o. 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  She reports she is also HER2 positive, having initially been equivocal.  She sees her oncologist tomorrow.  She reports she has kept up with her screening imaging.  She has never been pregnant.  Her family breast cancer history consist of a maternal aunt who is probably late in life when she had her disease.  She began menstruating at the age of 50, she is utilized both birth control and currently an estrogen patch for hormone replacement therapy. She denies any known mass, tenderness, pain, skin changes or nipple discharge.  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, Mohs   Thyroid disease    Tremor    Vitamin D deficiency       Past Surgical History:  Procedure Laterality Date   ABDOMINAL HYSTERECTOMY  1978   CATARACT EXTRACTION Left 12/20/2017   will have the right one completed a month later    CHOLECYSTECTOMY  2000   COLONOSCOPY  2007   JOINT REPLACEMENT Right 08/19/2017   great toe    UPPER GASTROINTESTINAL ENDOSCOPY  4097,3532    Allergies  Allergen Reactions   Hydrocodone Nausea Only    Per patient    Meloxicam Other (See Comments)    "skin infection on leg"   Sulfa  Antibiotics Rash    Rash all over body/fever    Current Outpatient Medications  Medication Sig Dispense Refill   Acetaminophen (TYLENOL PO) Take 500 mg by mouth 2 (two) times daily.     albuterol (PROVENTIL HFA;VENTOLIN HFA) 108 (90 Base) MCG/ACT inhaler Inhale 2 puffs into the lungs every 6 (six) hours as needed for wheezing or shortness of breath. 1 Inhaler 2   ALPRAZolam (XANAX) 0.25 MG tablet Take 1 tablet by mouth twice daily as needed for anxiety 45 tablet 0   atorvastatin (LIPITOR) 10 MG tablet Take 1 tablet (10 mg total) by mouth daily. 90 tablet 3   cyclobenzaprine (FLEXERIL) 5 MG tablet Take 1 tablet (5 mg total) by mouth daily as needed for muscle spasms. 90 tablet 1   estradiol (VIVELLE-DOT) 0.0375 MG/24HR APPLY 1 PATCH TOPICALLY TWICE A WEEK 8 patch 11   levothyroxine (SYNTHROID) 25 MCG tablet TAKE 1 TABLET BY MOUTH ONCE DAILY BEFORE BREAKFAST 90 tablet 2   Magnesium 250 MG TABS Take by mouth daily.     meclizine (ANTIVERT) 12.5 MG tablet Take 1 tablet (12.5 mg total) by mouth 3 (three) times daily as needed for dizziness. 60 tablet 1   Multiple Vitamin (MULTIVITAMIN) tablet Take 1 tablet by mouth daily.  pantoprazole (PROTONIX) 40 MG tablet Take 1 tablet (40 mg total) by mouth daily. 90 tablet 2   PARoxetine (PAXIL) 10 MG tablet Take 1.5 tablets (15 mg total) by mouth daily. 90 tablet 1   propranolol (INDERAL) 40 MG tablet Take 1 tablet (40 mg total) by mouth 2 (two) times daily. 180 tablet 2   RESTASIS 0.05 % ophthalmic emulsion Place into both eyes 2 (two) times daily.     VOLTAREN 1 % GEL SMARTSIG:1 Sparingly Topical PRN     No current facility-administered medications for this visit.    Family History Family History  Problem Relation Age of Onset   Arthritis Mother    Diabetes Mother    COPD Mother    Heart failure Mother    Stroke Father    Hypertension Father    Hyperlipidemia Father    Diabetes Father    CAD Father    Heart disease Father    Heart failure  Father    Breast cancer Maternal Aunt    Cancer Sister        bladder cancer    Throat cancer Brother    Colon cancer Neg Hx    Stomach cancer Neg Hx    Esophageal cancer Neg Hx       Social History Social History   Tobacco Use   Smoking status: Former    Packs/day: 1.50    Years: 25.00    Total pack years: 37.50    Types: Cigarettes    Quit date: 03/03/1990    Years since quitting: 32.1   Smokeless tobacco: Never  Vaping Use   Vaping Use: Never used  Substance Use Topics   Alcohol use: Yes    Comment: glass of wine once a month   Drug use: No        Review of Systems  Constitutional: Negative.   Eyes: Negative.   Respiratory:  Positive for cough, wheezing (COPD) and stridor.   Cardiovascular: Negative.   Gastrointestinal:  Positive for diarrhea and heartburn.  Genitourinary: Negative.   Skin: Negative.   Neurological:  Positive for dizziness (Vertigo).  Psychiatric/Behavioral:  The patient is nervous/anxious.       Physical Exam Blood pressure 136/83, pulse 73, temperature 98.2 F (36.8 C), temperature source Oral, height 5' 1.75" (1.568 m), weight 185 lb 3.2 oz (84 kg), SpO2 97 %. Last Weight  Most recent update: 05/06/2022 10:39 AM    Weight  84 kg (185 lb 3.2 oz)             CONSTITUTIONAL: Well developed, and nourished, appropriately responsive and aware without distress.   EYES: Sclera non-icteric.   EARS, NOSE, MOUTH AND THROAT:  The oropharynx is clear. Oral mucosa is pink and moist.    Hearing is intact to voice.  NECK: Trachea is midline, and there is no jugular venous distension.  LYMPH NODES:  Lymph nodes in the neck are not enlarged. RESPIRATORY:  Lungs are clear, and breath sounds are equal bilaterally. Normal respiratory effort without pathologic use of accessory muscles. CARDIOVASCULAR: Heart is regular in rate and rhythm. GI: The abdomen is soft, nontender, and nondistended.  MUSCULOSKELETAL:  Symmetrical muscle tone appreciated in all  four extremities.    SKIN: Skin turgor is normal. No pathologic skin lesions appreciated.  NEUROLOGIC:  Motor and sensation appear grossly normal.  Cranial nerves are grossly without defect. PSYCH:  Alert and oriented to person, place and time. Affect is appropriate for situation.  Data Reviewed I  have personally reviewed what is currently available of the patient's imaging, recent labs and medical records.   Labs:     Latest Ref Rng & Units 04/07/2022    3:10 PM 03/31/2022    2:01 PM 01/18/2018    2:00 PM  CBC  WBC 4.0 - 10.5 K/uL 8.6  8.2  11.6   Hemoglobin 12.0 - 15.0 g/dL 13.2  13.1  14.6   Hematocrit 36.0 - 46.0 % 40.2  38.9  42.8   Platelets 150 - 400 K/uL 259  238.0  308       Latest Ref Rng & Units 03/31/2022    2:01 PM 02/16/2021   10:33 AM 08/19/2020   10:34 AM  CMP  Glucose 70 - 99 mg/dL 100  96  108   BUN 6 - 23 mg/dL _0 Creatinine 0.40 - 1.20 mg/dL 0.91  0.96  0.88   Sodium 135 - 145 mEq/L 136  136  134   Potassium 3.5 - 5.1 mEq/L 3.9  4.4  4.6   Chloride 96 - 112 mEq/L 100  102  105   CO2 19 - 32 mEq/L _1 Calcium 8.4 - 10.5 mg/dL 9.0  9.1  9.0   Total Protein 6.0 - 8.3 g/dL 6.6  6.4    Total Bilirubin 0.2 - 1.2 mg/dL 0.5  0.6    Alkaline Phos 39 - 117 U/L 67  67    AST 0 - 37 U/L 21  22    ALT 0 - 35 U/L 23  27     Breast, right, needle core biopsy, 3:00 INVASIVE MODERATELY DIFFERENTIATED DUCTAL ADENOCARCINOMA, GRADE 2 (3+2+1) NEGATIVE FOR MICROCALCIFICATIONS NEGATIVE FOR ANGIOLYMPHATIC INVASION. TUMOR MEASURES 6 MM IN GREATEST LINEAR EXTENT  ADDITIONAL INFORMATION: FLUORESCENCE IN-SITU HYBRIDIZATION Results: GROUP 4: HER2 **NEGATIVE** Equivocal form of amplification of the HER2 gene was detected in the IHC 2+ tissue sample received from this individual. HER2 FISH was performed by a technologist and cell imaging and analysis on the [employer]. First Analysis: RATIO OF HER2/CEN 17 SIGNALS 1.26 AVERAGE HER2 COPY NUMBER PER CELL 4.60 An  additional 20 cells was analyzed by FISH in a separate region with invasive cancer. Additional Analysis: RATIO OF HER2/CEN 17 SIGNALS 1.23 AVERAGE HER2 COPY NUMBER PER CELL 4.22 The ratio of HER2/CEN 17 is within the normal range < 2.0 of HER2/CEN 17 and a copy number of HER2 signals per cell is >=4.0 and < 6.0. Arch Pathol Lab Med 1:1,2018   Imaging: Radiology review:  CLINICAL DATA:  Patient was recalled from screening mammogram for a possible mass in the right breast.   EXAM: DIGITAL DIAGNOSTIC UNILATERAL RIGHT MAMMOGRAM WITH TOMOSYNTHESIS; ULTRASOUND RIGHT BREAST LIMITED   TECHNIQUE: Right digital diagnostic mammography and breast tomosynthesis was performed.; Targeted ultrasound examination of the right breast was performed   COMPARISON:  Previous exam(s).   ACR Breast Density Category b: There are scattered areas of fibroglandular density.   FINDINGS: Additional imaging of the right breast was performed. There is persistence of a 1.7 cm spiculated mass in the medial aspect of the breast. There are no malignant type microcalcifications.   On physical exam, I do not palpate a mass in the right breast at 3 o'clock 3 cm from the nipple.   Targeted ultrasound is performed, showing an irregular hypoechoic mass in the right breast at 3 o'clock 3 cm from the nipple measuring 1.7 x 0.9 x 0.8 cm. Sonographic  evaluation of the right axilla does not show any enlarged adenopathy.   IMPRESSION: Suspicious 1.7 cm mass in the 3 o'clock region of the right breast 3 cm from the nipple.   RECOMMENDATION: Ultrasound-guided core biopsy of the mass in 3 o'clock region of the right breast is recommended. The biopsy will be scheduled the patient's convenience.   I have discussed the findings and recommendations with the patient. If applicable, a reminder letter will be sent to the patient regarding the next appointment.   BI-RADS CATEGORY  5: Highly suggestive of malignancy.      Electronically Signed   By: Lillia Mountain M.D.   On: 04/28/2022 11:07 Within last 24 hrs: No results found.  Assessment    Right breast invasive ductal carcinoma.  ER/PR positive, HER2 negative.  Patient Active Problem List   Diagnosis Date Noted   Spinal stenosis of lumbar region 12/31/2020   Acquired absence of both cervix and uterus 10/01/2020   Age-related osteoporosis without current pathological fracture 10/01/2020   Cervical high risk human papillomavirus (HPV) DNA test positive 10/01/2020   Menopause 10/01/2020   Stress incontinence (female) (female) 10/01/2020   Prediabetes 07/26/2019   Insulin resistance 06/14/2019   Dupuytren's disease of palm 02/24/2019   Trigger finger 02/24/2019   Hypothyroidism 05/19/2018   Vitamin D deficiency 12/16/2017   Class 1 obesity with serious comorbidity and body mass index (BMI) of 30.0 to 30.9 in adult 12/16/2017   Benign essential tremor 07/26/2017   Thyroid nodule 07/04/2017   Essential hypertension 06/07/2017   GERD (gastroesophageal reflux disease) 03/30/2016   Vertigo, benign positional, unspecified laterality 03/30/2016   Goiter, non-toxic 03/29/2016   Anxiety disorder 03/29/2016   Insomnia 03/29/2016   Osteopenia 03/29/2016   Migraine headache without aura 03/29/2016   Hyperlipidemia 03/29/2016   History of basal cell carcinoma 03/29/2016   Generalized osteoarthritis of multiple sites 03/29/2016   Back pain, chronic 03/29/2016    Plan    I discussed the available options with the patient. The risk of recurrence is similar between mastectomy and lumpectomy with radiation.  I also discussed that given the size of the cancer would recommend localization lumpectomy with radiation to follow.  I also discussed that we would need to do a sentinel lymph node biopsy to check the nodes.   Explained to the patient that after her surgical treatment additional treatment will depend on her prognostic indicators and stage.    I discussed  risks of bleeding, infection, damage to surrounding tissues, having positive margins, needing further resection, damage to nerves causing arm numbness or difficulty raising arm, causing lymphedema in the arm; as well as anesthesia risks of MI, stroke, prolonged ventilation, pulmonary embolism, thrombosis and even death.   Patient was given the opportunity to ask questions and have them answered.  They would like to proceed with right breast RFID localized lumpectomy with sentinel lymph node biopsy.    Face-to-face time spent with the patient and accompanying care providers(if present) was 45 minutes, with more than 50% of the time spent counseling, educating, and coordinating care of the patient.    These notes generated with voice recognition software. I apologize for typographical errors.  Ronny Bacon M.D., FACS 05/06/2022, 2:04 PM

## 2022-05-06 NOTE — Patient Instructions (Addendum)
Our surgery scheduler Barbara will call you within 24-48 hours to get you scheduled. If you have not heard from her after 48 hours, please call our office. Have the blue sheet available when she calls to write down important information.   If you have any concerns or questions, please feel free to call our office.  Lumpectomy  A lumpectomy, sometimes called a partial mastectomy, is surgery to remove a cancerous tumor or mass (the lump) from a breast. It is a form of breast-conserving or breast-preservation surgery. This means that the cancerous tissue is removed but the breast remains intact. During a lumpectomy, the portion of the breast that contains the tumor is removed. Lymph nodes under your arm may also be removed. Lymph nodes are part of the body's disease-fighting system (immune system) and are usually the first place where breast cancer spreads. Tell a health care provider about: Any allergies you have. All medicines you are taking, including vitamins, herbs, eye drops, creams, and over-the-counter medicines. Any problems you or family members have had with anesthetic medicines. Any bleeding problems you have. Any surgeries you have had. Any medical conditions you have. Whether you are pregnant or may be pregnant. What are the risks? Generally, this is a safe procedure. However, problems may occur, including: Bleeding. Infection. Allergic reaction to medicines. Pain, swelling, weakness, or numbness in the arm on the side of your surgery. Temporary swelling. Change in the shape of the breast, especially if a large portion is removed. Scar tissue that forms at the surgical site and feels hard to the touch. Blood clots. What happens before the procedure? When to stop eating and drinking Follow instructions from your health care provider about what you may eat and drink. These may include: 8 hours before your procedure Stop eating most foods. Do not eat meat, fried foods, or fatty  foods. Eat only light foods, such as toast or crackers. All liquids are okay except energy drinks and alcohol. 6 hours before your procedure Stop eating. Drink only clear liquids, such as water, clear fruit juice, black coffee, plain tea, and sports drinks. Do not drink energy drinks or alcohol. 2 hours before your procedure Stop drinking all liquids. You may be allowed to take medicines with small sips of water. If you do not follow your health care provider's instructions, your procedure may be delayed or canceled. Medicines Ask your health care provider about: Changing or stopping your regular medicines. These include any diabetes medicines or blood thinners you take. Taking medicines such as aspirin and ibuprofen. These medicines can thin your blood. Do not take them unless your health care provider tells you to. Taking over-the-counter medicines, vitamins, herbs, and supplements. Surgery safety Ask your health care provider how your surgery site will be marked. A procedure may be done to locate and mark the tumor area in the breast (localization). This will guide the surgeon to where the incision will be made. This may be done with: Imaging, such as a mammogram, ultrasound, or MRI. Insertion of a small wire, clip, or seed, or an implant that will reflect a radar signal. Also, ask what steps will be taken to help prevent infection. These may include: Washing skin with a germ-killing soap. Taking antibiotic medicine. General instructions You may have screening tests or exams to get normal measurements of your arm, also called baseline measurements. These can be compared to measurements done after surgery to monitor for swelling (lymphedema) that can develop after having lymph nodes removed. If you   will be going home right after the procedure, plan to have a responsible adult: Take you home from the hospital or clinic. You will not be allowed to drive. Care for you for the time you are  told. What happens during the procedure?  An IV will be inserted into one of your veins. You may be given: A sedative. This helps you relax. Anesthesia. This will: Numb certain areas of your body. Make you fall asleep for surgery. An electric scalpel will be used to reduce bleeding (electrocautery knife). A curved incision that follows the natural curve of your breast will be made. The tumor will be removed along with some of the tissue around it. This will be sent to the lab for testing. Your health care provider may also remove lymph nodes at this time if needed. If the tumor is close to the muscles over your chest, some muscle tissue may also be removed. A small drain tube may be inserted into your breast area or armpit to collect fluid that may build up after surgery. This tube will be connected to a suction bulb on the outside of your body to remove the fluid. The incision will be closed with stitches (sutures). A bandage (dressing) may be placed over the incision. The procedure may vary among health care providers and hospitals. What happens after the procedure? Your blood pressure, heart rate, breathing rate, and blood oxygen level will be monitored until you leave the hospital or clinic. You will be given medicine for pain as needed. You will be encouraged to get up and walk as soon as you can. This will improve blood flow and breathing. Ask for help if you feel weak or unsteady. You may have a drain tube in place for 2-3 days to prevent a collection of blood (hematoma) from developing in the breast. You may have a pressure bandage applied for 1-2 days to prevent bleeding or swelling. Ask your health care provider how to care for your bandage at home. You may be given a tight sleeve to wear over your arm on the side of your surgery. Wear the sleeve as told by your health care provider. Do not drive or operate machinery until your health care provider says that it is safe. Where to  find more information American Cancer Society: cancer.org National Cancer Institute: cancer.gov Summary A lumpectomy, sometimes called a partial mastectomy, is surgery to remove a cancerous tumor or mass (the lump) from a breast. During a lumpectomy, the portion of the breast that contains the tumor is removed. Plan to have someone take you home from the hospital or clinic. You may have a drain tube in place for 2-3 days to prevent a collection of blood (hematoma) from developing in the breast. This information is not intended to replace advice given to you by your health care provider. Make sure you discuss any questions you have with your health care provider. Document Revised: 10/19/2021 Document Reviewed: 10/04/2021 Elsevier Patient Education  2023 [employer]  

## 2022-05-06 NOTE — Progress Notes (Signed)
Patient ID: ADISON REIFSTECK, female   DOB: 1951-06-28, 71 y.o.   MRN: [id number]  Chief Complaint: Right breast cancer  History of Present Illness CHASITIE PASSEY is a 71 y.o. 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  She reports she is also HER2 positive, having initially been equivocal.  She sees her oncologist tomorrow.  She reports she has kept up with her screening imaging.  She has never been pregnant.  Her family breast cancer history consist of a maternal aunt who is probably late in life when she had her disease.  She began menstruating at the age of 50, she is utilized both birth control and currently an estrogen patch for hormone replacement therapy. She denies any known mass, tenderness, pain, skin changes or nipple discharge.  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, Mohs   Thyroid disease    Tremor    Vitamin D deficiency       Past Surgical History:  Procedure Laterality Date   ABDOMINAL HYSTERECTOMY  1978   CATARACT EXTRACTION Left 12/20/2017   will have the right one completed a month later    CHOLECYSTECTOMY  2000   COLONOSCOPY  2007   JOINT REPLACEMENT Right 08/19/2017   great toe    UPPER GASTROINTESTINAL ENDOSCOPY  4097,3532    Allergies  Allergen Reactions   Hydrocodone Nausea Only    Per patient    Meloxicam Other (See Comments)    "skin infection on leg"   Sulfa  Antibiotics Rash    Rash all over body/fever    Current Outpatient Medications  Medication Sig Dispense Refill   Acetaminophen (TYLENOL PO) Take 500 mg by mouth 2 (two) times daily.     albuterol (PROVENTIL HFA;VENTOLIN HFA) 108 (90 Base) MCG/ACT inhaler Inhale 2 puffs into the lungs every 6 (six) hours as needed for wheezing or shortness of breath. 1 Inhaler 2   ALPRAZolam (XANAX) 0.25 MG tablet Take 1 tablet by mouth twice daily as needed for anxiety 45 tablet 0   atorvastatin (LIPITOR) 10 MG tablet Take 1 tablet (10 mg total) by mouth daily. 90 tablet 3   cyclobenzaprine (FLEXERIL) 5 MG tablet Take 1 tablet (5 mg total) by mouth daily as needed for muscle spasms. 90 tablet 1   estradiol (VIVELLE-DOT) 0.0375 MG/24HR APPLY 1 PATCH TOPICALLY TWICE A WEEK 8 patch 11   levothyroxine (SYNTHROID) 25 MCG tablet TAKE 1 TABLET BY MOUTH ONCE DAILY BEFORE BREAKFAST 90 tablet 2   Magnesium 250 MG TABS Take by mouth daily.     meclizine (ANTIVERT) 12.5 MG tablet Take 1 tablet (12.5 mg total) by mouth 3 (three) times daily as needed for dizziness. 60 tablet 1   Multiple Vitamin (MULTIVITAMIN) tablet Take 1 tablet by mouth daily.  pantoprazole (PROTONIX) 40 MG tablet Take 1 tablet (40 mg total) by mouth daily. 90 tablet 2   PARoxetine (PAXIL) 10 MG tablet Take 1.5 tablets (15 mg total) by mouth daily. 90 tablet 1   propranolol (INDERAL) 40 MG tablet Take 1 tablet (40 mg total) by mouth 2 (two) times daily. 180 tablet 2   RESTASIS 0.05 % ophthalmic emulsion Place into both eyes 2 (two) times daily.     VOLTAREN 1 % GEL SMARTSIG:1 Sparingly Topical PRN     No current facility-administered medications for this visit.    Family History Family History  Problem Relation Age of Onset   Arthritis Mother    Diabetes Mother    COPD Mother    Heart failure Mother    Stroke Father    Hypertension Father    Hyperlipidemia Father    Diabetes Father    CAD Father    Heart disease Father    Heart failure  Father    Breast cancer Maternal Aunt    Cancer Sister        bladder cancer    Throat cancer Brother    Colon cancer Neg Hx    Stomach cancer Neg Hx    Esophageal cancer Neg Hx       Social History Social History   Tobacco Use   Smoking status: Former    Packs/day: 1.50    Years: 25.00    Total pack years: 37.50    Types: Cigarettes    Quit date: 03/03/1990    Years since quitting: 32.1   Smokeless tobacco: Never  Vaping Use   Vaping Use: Never used  Substance Use Topics   Alcohol use: Yes    Comment: glass of wine once a month   Drug use: No        Review of Systems  Constitutional: Negative.   Eyes: Negative.   Respiratory:  Positive for cough, wheezing (COPD) and stridor.   Cardiovascular: Negative.   Gastrointestinal:  Positive for diarrhea and heartburn.  Genitourinary: Negative.   Skin: Negative.   Neurological:  Positive for dizziness (Vertigo).  Psychiatric/Behavioral:  The patient is nervous/anxious.       Physical Exam Blood pressure 136/83, pulse 73, temperature 98.2 F (36.8 C), temperature source Oral, height 5' 1.75" (1.568 m), weight 185 lb 3.2 oz (84 kg), SpO2 97 %. Last Weight  Most recent update: 05/06/2022 10:39 AM    Weight  84 kg (185 lb 3.2 oz)             CONSTITUTIONAL: Well developed, and nourished, appropriately responsive and aware without distress.   EYES: Sclera non-icteric.   EARS, NOSE, MOUTH AND THROAT:  The oropharynx is clear. Oral mucosa is pink and moist.    Hearing is intact to voice.  NECK: Trachea is midline, and there is no jugular venous distension.  LYMPH NODES:  Lymph nodes in the neck are not enlarged. RESPIRATORY:  Lungs are clear, and breath sounds are equal bilaterally. Normal respiratory effort without pathologic use of accessory muscles. CARDIOVASCULAR: Heart is regular in rate and rhythm. GI: The abdomen is soft, nontender, and nondistended.  MUSCULOSKELETAL:  Symmetrical muscle tone appreciated in all  four extremities.    SKIN: Skin turgor is normal. No pathologic skin lesions appreciated.  NEUROLOGIC:  Motor and sensation appear grossly normal.  Cranial nerves are grossly without defect. PSYCH:  Alert and oriented to person, place and time. Affect is appropriate for situation.  Data Reviewed I   have personally reviewed what is currently available of the patient's imaging, recent labs and medical records.   Labs:     Latest Ref Rng & Units 04/07/2022    3:10 PM 03/31/2022    2:01 PM 01/18/2018    2:00 PM  CBC  WBC 4.0 - 10.5 K/uL 8.6  8.2  11.6   Hemoglobin 12.0 - 15.0 g/dL 13.2  13.1  14.6   Hematocrit 36.0 - 46.0 % 40.2  38.9  42.8   Platelets 150 - 400 K/uL 259  238.0  308       Latest Ref Rng & Units 03/31/2022    2:01 PM 02/16/2021   10:33 AM 08/19/2020   10:34 AM  CMP  Glucose 70 - 99 mg/dL 100  96  108   BUN 6 - 23 mg/dL _0 Creatinine 0.40 - 1.20 mg/dL 0.91  0.96  0.88   Sodium 135 - 145 mEq/L 136  136  134   Potassium 3.5 - 5.1 mEq/L 3.9  4.4  4.6   Chloride 96 - 112 mEq/L 100  102  105   CO2 19 - 32 mEq/L _1 Calcium 8.4 - 10.5 mg/dL 9.0  9.1  9.0   Total Protein 6.0 - 8.3 g/dL 6.6  6.4    Total Bilirubin 0.2 - 1.2 mg/dL 0.5  0.6    Alkaline Phos 39 - 117 U/L 67  67    AST 0 - 37 U/L 21  22    ALT 0 - 35 U/L 23  27     Breast, right, needle core biopsy, 3:00 INVASIVE MODERATELY DIFFERENTIATED DUCTAL ADENOCARCINOMA, GRADE 2 (3+2+1) NEGATIVE FOR MICROCALCIFICATIONS NEGATIVE FOR ANGIOLYMPHATIC INVASION. TUMOR MEASURES 6 MM IN GREATEST LINEAR EXTENT  ADDITIONAL INFORMATION: FLUORESCENCE IN-SITU HYBRIDIZATION Results: GROUP 4: HER2 **NEGATIVE** Equivocal form of amplification of the HER2 gene was detected in the IHC 2+ tissue sample received from this individual. HER2 FISH was performed by a technologist and cell imaging and analysis on the [employer]. First Analysis: RATIO OF HER2/CEN 17 SIGNALS 1.26 AVERAGE HER2 COPY NUMBER PER CELL 4.60 An  additional 20 cells was analyzed by FISH in a separate region with invasive cancer. Additional Analysis: RATIO OF HER2/CEN 17 SIGNALS 1.23 AVERAGE HER2 COPY NUMBER PER CELL 4.22 The ratio of HER2/CEN 17 is within the normal range < 2.0 of HER2/CEN 17 and a copy number of HER2 signals per cell is >=4.0 and < 6.0. Arch Pathol Lab Med 1:1,2018   Imaging: Radiology review:  CLINICAL DATA:  Patient was recalled from screening mammogram for a possible mass in the right breast.   EXAM: DIGITAL DIAGNOSTIC UNILATERAL RIGHT MAMMOGRAM WITH TOMOSYNTHESIS; ULTRASOUND RIGHT BREAST LIMITED   TECHNIQUE: Right digital diagnostic mammography and breast tomosynthesis was performed.; Targeted ultrasound examination of the right breast was performed   COMPARISON:  Previous exam(s).   ACR Breast Density Category b: There are scattered areas of fibroglandular density.   FINDINGS: Additional imaging of the right breast was performed. There is persistence of a 1.7 cm spiculated mass in the medial aspect of the breast. There are no malignant type microcalcifications.   On physical exam, I do not palpate a mass in the right breast at 3 o'clock 3 cm from the nipple.   Targeted ultrasound is performed, showing an irregular hypoechoic mass in the right breast at 3 o'clock 3 cm from the nipple measuring 1.7 x 0.9 x 0.8 cm. Sonographic  evaluation of the right axilla does not show any enlarged adenopathy.   IMPRESSION: Suspicious 1.7 cm mass in the 3 o'clock region of the right breast 3 cm from the nipple.   RECOMMENDATION: Ultrasound-guided core biopsy of the mass in 3 o'clock region of the right breast is recommended. The biopsy will be scheduled the patient's convenience.   I have discussed the findings and recommendations with the patient. If applicable, a reminder letter will be sent to the patient regarding the next appointment.   BI-RADS CATEGORY  5: Highly suggestive of malignancy.      Electronically Signed   By: Lillia Mountain M.D.   On: 04/28/2022 11:07 Within last 24 hrs: No results found.  Assessment    Right breast invasive ductal carcinoma.  ER/PR positive, HER2 negative.  Patient Active Problem List   Diagnosis Date Noted   Spinal stenosis of lumbar region 12/31/2020   Acquired absence of both cervix and uterus 10/01/2020   Age-related osteoporosis without current pathological fracture 10/01/2020   Cervical high risk human papillomavirus (HPV) DNA test positive 10/01/2020   Menopause 10/01/2020   Stress incontinence (female) (female) 10/01/2020   Prediabetes 07/26/2019   Insulin resistance 06/14/2019   Dupuytren's disease of palm 02/24/2019   Trigger finger 02/24/2019   Hypothyroidism 05/19/2018   Vitamin D deficiency 12/16/2017   Class 1 obesity with serious comorbidity and body mass index (BMI) of 30.0 to 30.9 in adult 12/16/2017   Benign essential tremor 07/26/2017   Thyroid nodule 07/04/2017   Essential hypertension 06/07/2017   GERD (gastroesophageal reflux disease) 03/30/2016   Vertigo, benign positional, unspecified laterality 03/30/2016   Goiter, non-toxic 03/29/2016   Anxiety disorder 03/29/2016   Insomnia 03/29/2016   Osteopenia 03/29/2016   Migraine headache without aura 03/29/2016   Hyperlipidemia 03/29/2016   History of basal cell carcinoma 03/29/2016   Generalized osteoarthritis of multiple sites 03/29/2016   Back pain, chronic 03/29/2016    Plan    I discussed the available options with the patient. The risk of recurrence is similar between mastectomy and lumpectomy with radiation.  I also discussed that given the size of the cancer would recommend localization lumpectomy with radiation to follow.  I also discussed that we would need to do a sentinel lymph node biopsy to check the nodes.   Explained to the patient that after her surgical treatment additional treatment will depend on her prognostic indicators and stage.    I discussed  risks of bleeding, infection, damage to surrounding tissues, having positive margins, needing further resection, damage to nerves causing arm numbness or difficulty raising arm, causing lymphedema in the arm; as well as anesthesia risks of MI, stroke, prolonged ventilation, pulmonary embolism, thrombosis and even death.   Patient was given the opportunity to ask questions and have them answered.  They would like to proceed with right breast RFID localized lumpectomy with sentinel lymph node biopsy.    Face-to-face time spent with the patient and accompanying care providers(if present) was 45 minutes, with more than 50% of the time spent counseling, educating, and coordinating care of the patient.    These notes generated with voice recognition software. I apologize for typographical errors.  Ronny Bacon M.D., FACS 05/06/2022, 2:04 PM

## 2022-05-07 ENCOUNTER — Encounter: Payer: Self-pay | Admitting: Oncology

## 2022-05-07 ENCOUNTER — Inpatient Hospital Stay: Payer: PPO | Attending: Oncology | Admitting: Oncology

## 2022-05-07 ENCOUNTER — Inpatient Hospital Stay: Payer: PPO

## 2022-05-07 ENCOUNTER — Encounter: Payer: Self-pay | Admitting: *Deleted

## 2022-05-07 ENCOUNTER — Ambulatory Visit: Payer: Self-pay | Admitting: Surgery

## 2022-05-07 DIAGNOSIS — Z90710 Acquired absence of both cervix and uterus: Secondary | ICD-10-CM | POA: Diagnosis not present

## 2022-05-07 DIAGNOSIS — C50919 Malignant neoplasm of unspecified site of unspecified female breast: Secondary | ICD-10-CM

## 2022-05-07 DIAGNOSIS — I10 Essential (primary) hypertension: Secondary | ICD-10-CM

## 2022-05-07 DIAGNOSIS — Z87891 Personal history of nicotine dependence: Secondary | ICD-10-CM

## 2022-05-07 DIAGNOSIS — Z8052 Family history of malignant neoplasm of bladder: Secondary | ICD-10-CM | POA: Diagnosis not present

## 2022-05-07 DIAGNOSIS — Z809 Family history of malignant neoplasm, unspecified: Secondary | ICD-10-CM | POA: Insufficient documentation

## 2022-05-07 DIAGNOSIS — Z170 Estrogen receptor positive status [ER+]: Secondary | ICD-10-CM

## 2022-05-07 DIAGNOSIS — C50911 Malignant neoplasm of unspecified site of right female breast: Secondary | ICD-10-CM | POA: Diagnosis not present

## 2022-05-07 DIAGNOSIS — Z803 Family history of malignant neoplasm of breast: Secondary | ICD-10-CM | POA: Diagnosis not present

## 2022-05-07 DIAGNOSIS — C50211 Malignant neoplasm of upper-inner quadrant of right female breast: Secondary | ICD-10-CM

## 2022-05-07 NOTE — Assessment & Plan Note (Signed)
Recommend genetic counseling for genetic testing.  Patient would like to defer until later.   She knows to notify me if she changes her mind.. 

## 2022-05-07 NOTE — Progress Notes (Unsigned)
Hematology/Oncology Consult Note [phone] [phone]     REFERRING PROVIDER: Rod Can, CNM   Patient Care Team: Martinique, Betty G, MD as PCP - General (Family Medicine) Viona Gilmore, [facility] as Pharmacist (Pharmacist) Daiva Huge, RN as Oncology Nurse Navigator  CHIEF COMPLAINTS/PURPOSE OF CONSULTATION:  ***  HISTORY OF PRESENTING ILLNESS:  Kristina [name] 71 y.o. female presents to establish care for  I have reviewed her chart and materials related to her cancer extensively and collaborated history with the patient. Summary of oncologic history is as follows: Oncology History  Breast cancer in female [facility])  05/07/2022 Initial Diagnosis   Breast cancer in female [facility])   05/07/2022 Cancer Staging   Staging form: Breast, AJCC 8th Edition - Clinical stage from 05/07/2022: Stage IA (cT1c, cN0, cM0, G2, ER+, PR+, HER2-) - Signed by Earlie Server, MD on 05/07/2022 Stage prefix: Initial diagnosis Histologic grading system: 3 grade system     MEDICAL HISTORY:  Past Medical History:  Diagnosis Date   Actinic keratosis    Anxiety    Arthritis    Back pain    Cancer (HCC)    basal and squamous cell skin   Chicken pox    Chronic back pain    COPD (chronic obstructive pulmonary disease) (HCC)    Depression    Gallbladder problem    GERD (gastroesophageal reflux disease)    Hypertension    Hypothyroidism    Joint pain    Lactose intolerance    Migraines    Osteoarthritis    SOB (shortness of breath)    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, Mohs   Thyroid disease    Tremor    Vitamin D deficiency     SURGICAL HISTORY: Past Surgical History:  Procedure Laterality Date   ABDOMINAL HYSTERECTOMY  1978   CATARACT EXTRACTION Left 12/20/2017   will have the right one completed a month later    CHOLECYSTECTOMY  2000   COLONOSCOPY  2007   JOINT REPLACEMENT Right 08/19/2017   great toe    UPPER GASTROINTESTINAL ENDOSCOPY  2007,2014     SOCIAL HISTORY: Social History   Socioeconomic History   Marital status: Single    Spouse name: Not on file   Number of children: 0   Years of education: 12   Highest education level: Some college, no degree  Occupational History   Occupation: retired    Comment: administration  Tobacco Use   Smoking status: Former    Packs/day: 1.50    Years: 25.00    Total pack years: 37.50    Types: Cigarettes    Quit date: 03/03/1990    Years since quitting: 32.2   Smokeless tobacco: Never  Vaping Use   Vaping Use: Never used  Substance and Sexual Activity   Alcohol use: Yes    Comment: glass of wine once a month   Drug use: No   Sexual activity: Never  Other Topics Concern   Not on file  Social History Narrative   Lives alone   Caffeine- tea, 1 cup   Social Determinants of Health   Financial Resource Strain: Low Risk  (03/29/2022)   Overall Financial Resource Strain (CARDIA)    Difficulty of Paying Living Expenses: Not very hard  Food Insecurity: No Food Insecurity (03/29/2022)   Hunger Vital Sign    Worried About Running Out of Food in the Last Year: Never true    Ran Out of Food in the  Last Year: Never true  Transportation Needs: No Transportation Needs (03/29/2022)   PRAPARE - Hydrologist (Medical): No    Lack of Transportation (Non-Medical): No  Physical Activity: Sufficiently Active (03/29/2022)   Exercise Vital Sign    Days of Exercise per Week: 3 days    Minutes of Exercise per Session: 60 min  Stress: Stress Concern Present (03/29/2022)   [place]    Feeling of Stress : Very much  Social Connections: Socially Isolated (03/29/2022)   Social Connection and Isolation Panel [NHANES]    Frequency of Communication with Friends and Family: More than three times a week    Frequency of Social Gatherings with Friends and Family: Once a week    Attends Religious Services: Never     Marine scientist or Organizations: No    Attends Music therapist: Not on file    Marital Status: Divorced  Human resources officer Violence: Not on file    FAMILY HISTORY: Family History  Problem Relation Age of Onset   Arthritis Mother    Diabetes Mother    COPD Mother    Heart failure Mother    Stroke Father    Hypertension Father    Hyperlipidemia Father    Diabetes Father    CAD Father    Heart disease Father    Heart failure Father    Breast cancer Maternal Aunt    Cancer Sister        bladder cancer    Throat cancer Brother    Colon cancer Neg Hx    Stomach cancer Neg Hx    Esophageal cancer Neg Hx     ALLERGIES:  is allergic to hydrocodone, meloxicam, and sulfa antibiotics.  MEDICATIONS:  Current Outpatient Medications  Medication Sig Dispense Refill   Acetaminophen (TYLENOL PO) Take 500 mg by mouth 2 (two) times daily.     albuterol (PROVENTIL HFA;VENTOLIN HFA) 108 (90 Base) MCG/ACT inhaler Inhale 2 puffs into the lungs every 6 (six) hours as needed for wheezing or shortness of breath. 1 Inhaler 2   ALPRAZolam (XANAX) 0.25 MG tablet Take 1 tablet by mouth twice daily as needed for anxiety 45 tablet 0   atorvastatin (LIPITOR) 10 MG tablet Take 1 tablet (10 mg total) by mouth daily. 90 tablet 3   azelaic acid (AZELEX) 20 % cream Apply topically 2 (two) times daily. After skin is thoroughly washed and patted dry, gently but thoroughly massage a thin film of azelaic acid cream into the affected area twice daily, in the morning and evening.     calcium-vitamin D (OSCAL WITH D) 500-5 MG-MCG tablet Take 1 tablet by mouth once.     cyanocobalamin (VITAMIN B12) 500 MCG tablet Take 1,000 mcg by mouth daily.     cyclobenzaprine (FLEXERIL) 5 MG tablet Take 1 tablet (5 mg total) by mouth daily as needed for muscle spasms. 90 tablet 1   levothyroxine (SYNTHROID) 25 MCG tablet TAKE 1 TABLET BY MOUTH ONCE DAILY BEFORE BREAKFAST 90 tablet 2   Magnesium 250 MG TABS  Take by mouth daily.     meclizine (ANTIVERT) 12.5 MG tablet Take 1 tablet (12.5 mg total) by mouth 3 (three) times daily as needed for dizziness. 60 tablet 1   Multiple Vitamin (MULTIVITAMIN) tablet Take 1 tablet by mouth daily.     pantoprazole (PROTONIX) 40 MG tablet Take 1 tablet (40 mg total) by mouth daily. 90 tablet  2   PARoxetine (PAXIL) 10 MG tablet Take 1.5 tablets (15 mg total) by mouth daily. 90 tablet 1   propranolol (INDERAL) 40 MG tablet Take 1 tablet (40 mg total) by mouth 2 (two) times daily. 180 tablet 2   RESTASIS 0.05 % ophthalmic emulsion Place into both eyes 2 (two) times daily.     VOLTAREN 1 % GEL SMARTSIG:1 Sparingly Topical PRN     estradiol (VIVELLE-DOT) 0.0375 MG/24HR APPLY 1 PATCH TOPICALLY TWICE A WEEK (Patient not taking: Reported on 05/07/2022) 8 patch 11   No current facility-administered medications for this visit.    Review of Systems - Oncology   PHYSICAL EXAMINATION: ECOG PERFORMANCE STATUS: {CHL ONC ECOG ZD:[id number]}  Vitals:   05/07/22 1227  BP: 131/74  Pulse: 75  Resp: 20  Temp: 97.8 F (36.6 C)  SpO2: 100%   Filed Weights   05/07/22 1227  Weight: 187 lb 4.8 oz (85 kg)    Physical Exam   LABORATORY DATA:  I have reviewed the data as listed    Latest Ref Rng & Units 04/07/2022    3:10 PM 03/31/2022    2:01 PM 01/18/2018    2:00 PM  CBC  WBC 4.0 - 10.5 K/uL 8.6  8.2  11.6   Hemoglobin 12.0 - 15.0 g/dL 13.2  13.1  14.6   Hematocrit 36.0 - 46.0 % 40.2  38.9  42.8   Platelets 150 - 400 K/uL 259  238.0  308       Latest Ref Rng & Units 03/31/2022    2:01 PM 02/16/2021   10:33 AM 08/19/2020   10:34 AM  CMP  Glucose 70 - 99 mg/dL 100  96  108   BUN 6 - 23 mg/dL 16  17  25    Creatinine 0.40 - 1.20 mg/dL 0.91  0.96  0.88   Sodium 135 - 145 mEq/L 136  136  134   Potassium 3.5 - 5.1 mEq/L 3.9  4.4  4.6   Chloride 96 - 112 mEq/L 100  102  105   CO2 19 - 32 mEq/L 26  26  22    Calcium 8.4 - 10.5 mg/dL 9.0  9.1  9.0   Total Protein 6.0  - 8.3 g/dL 6.6  6.4    Total Bilirubin 0.2 - 1.2 mg/dL 0.5  0.6    Alkaline Phos 39 - 117 U/L 67  67    AST 0 - 37 U/L 21  22    ALT 0 - 35 U/L 23  27       RADIOGRAPHIC STUDIES: I have personally reviewed the radiological images as listed and agreed with the findings in the report. 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 This was found to be concordant by Dr. Dorise Bullion. Pathology results were discussed with the patient by telephone. The patient reported doing well after the biopsy with tenderness at the site. Post biopsy instructions and care were reviewed and questions were answered. The patient was encouraged to call [facility] for any additional concerns. Per patient request, A surgical referral will be arranged by Casper Harrison RN, Oncology Nurse Navigator of [facility]. Pathology results reported by Stacie Acres RN on 05/03/2022. Electronically Signed   By: Shanon Brow  Jimmye Norman III M.D.   On: 05/04/2022 19:10   Result Date: 05/04/2022 CLINICAL DATA:  Ultrasound-guided biopsy of the 3 o'clock right breast mass EXAM: ULTRASOUND GUIDED RIGHT BREAST CORE NEEDLE BIOPSY COMPARISON:  Previous exam(s). PROCEDURE: I met with the patient and we discussed the procedure of ultrasound-guided biopsy, including benefits and alternatives. We discussed the high likelihood of a successful procedure. We discussed the risks of the procedure, including infection, bleeding, tissue injury, clip migration, and inadequate sampling. Informed written consent was given. The usual time-out protocol was performed immediately prior to the procedure. Lesion quadrant: 3 o'clock right breast Using sterile technique and 1% Lidocaine as local anesthetic,  under direct ultrasound visualization, a 12 gauge spring-loaded device was used to perform biopsy of a 3 o'clock right breast mass using a lateral approach. At the conclusion of the procedure a ribbon shaped tissue marker clip was deployed into the biopsy cavity. Follow up 2 view mammogram was performed and dictated separately. IMPRESSION: Ultrasound guided biopsy of a 3 o'clock right breast mass. No apparent complications. Electronically Signed: By: Dorise Bullion III M.D. On: 04/30/2022 12:22  MM CLIP PLACEMENT RIGHT  Result Date: 04/30/2022 CLINICAL DATA:  Evaluate biopsy marker EXAM: 3D DIAGNOSTIC RIGHT MAMMOGRAM POST ULTRASOUND BIOPSY COMPARISON:  Previous exam(s). FINDINGS: 3D Mammographic images were obtained following ultrasound guided biopsy of a right breast mass. The biopsy marking clip is in expected position at the site of biopsy. IMPRESSION: Appropriate positioning of the ribbon shaped biopsy marking clip at the site of biopsy in the biopsied right breast mass. Final Assessment: Post Procedure Mammograms for Marker Placement Electronically Signed   By: Dorise Bullion III M.D.   On: 04/30/2022 12:39  MM DIAG BREAST TOMO UNI RIGHT  Result Date: 04/28/2022 CLINICAL DATA:  Patient was recalled from screening mammogram for a possible mass in the right breast. EXAM: DIGITAL DIAGNOSTIC UNILATERAL RIGHT MAMMOGRAM WITH TOMOSYNTHESIS; ULTRASOUND RIGHT BREAST LIMITED TECHNIQUE: Right digital diagnostic mammography and breast tomosynthesis was performed.; Targeted ultrasound examination of the right breast was performed COMPARISON:  Previous exam(s). ACR Breast Density Category b: There are scattered areas of fibroglandular density. FINDINGS: Additional imaging of the right breast was performed. There is persistence of a 1.7 cm spiculated mass in the medial aspect of the breast. There are no malignant type microcalcifications. On physical exam, I do not palpate a mass in the right breast at 3 o'clock 3  cm from the nipple. Targeted ultrasound is performed, showing an irregular hypoechoic mass in the right breast at 3 o'clock 3 cm from the nipple measuring 1.7 x 0.9 x 0.8 cm. Sonographic evaluation of the right axilla does not show any enlarged adenopathy. IMPRESSION: Suspicious 1.7 cm mass in the 3 o'clock region of the right breast 3 cm from the nipple. RECOMMENDATION: Ultrasound-guided core biopsy of the mass in 3 o'clock region of the right breast is recommended. The biopsy will be scheduled the patient's convenience. I have discussed the findings and recommendations with the patient. If applicable, a reminder letter will be sent to the patient regarding the next appointment. BI-RADS CATEGORY  5: Highly suggestive of malignancy. Electronically Signed   By: Lillia Mountain M.D.   On: 04/28/2022 11:07  US BREAST LTD UNI RIGHT INC AXILLA  Result Date: 04/28/2022 CLINICAL DATA:  Patient was recalled from screening mammogram for a possible mass in the right breast. EXAM: DIGITAL DIAGNOSTIC UNILATERAL RIGHT MAMMOGRAM WITH TOMOSYNTHESIS; ULTRASOUND RIGHT BREAST LIMITED TECHNIQUE: Right digital diagnostic mammography and breast tomosynthesis was performed.;  Targeted ultrasound examination of the right breast was performed COMPARISON:  Previous exam(s). ACR Breast Density Category b: There are scattered areas of fibroglandular density. FINDINGS: Additional imaging of the right breast was performed. There is persistence of a 1.7 cm spiculated mass in the medial aspect of the breast. There are no malignant type microcalcifications. On physical exam, I do not palpate a mass in the right breast at 3 o'clock 3 cm from the nipple. Targeted ultrasound is performed, showing an irregular hypoechoic mass in the right breast at 3 o'clock 3 cm from the nipple measuring 1.7 x 0.9 x 0.8 cm. Sonographic evaluation of the right axilla does not show any enlarged adenopathy. IMPRESSION: Suspicious 1.7 cm mass in the 3 o'clock region of  the right breast 3 cm from the nipple. RECOMMENDATION: Ultrasound-guided core biopsy of the mass in 3 o'clock region of the right breast is recommended. The biopsy will be scheduled the patient's convenience. I have discussed the findings and recommendations with the patient. If applicable, a reminder letter will be sent to the patient regarding the next appointment. BI-RADS CATEGORY  5: Highly suggestive of malignancy. Electronically Signed   By: Lillia Mountain M.D.   On: 04/28/2022 11:07  MR BRAIN W WO CONTRAST  Result Date: 04/19/2022 CLINICAL DATA:  Episode of blurry vision with headache and dizziness EXAM: MRI HEAD WITHOUT AND WITH CONTRAST TECHNIQUE: Multiplanar, multiecho pulse sequences of the brain and surrounding structures were obtained without and with intravenous contrast. CONTRAST:  7.50m GADAVIST GADOBUTROL 1 MMOL/ML IV SOLN COMPARISON:  06/12/2018 FINDINGS: Brain: No restricted diffusion to suggest acute or subacute infarct. No acute hemorrhage, mass, mass effect, or midline shift. No hydrocephalus or extra-axial collection. No abnormal parenchymal or meningeal enhancement. Cerebral volume is within normal limits for age. Scattered T2 hyperintense signal in the periventricular white matter, likely the sequela of mild chronic small vessel ischemic disease. Vascular: Normal arterial flow voids. Normal arterial and venous enhancement. Skull and upper cervical spine: Normal marrow signal. Sinuses/Orbits: No acute finding. Other: The mastoids are well aerated. IMPRESSION: No acute intracranial process. No evidence of acute or subacute infarct. No etiology seen for the patient's symptoms. Electronically Signed   By: AMerilyn BabaM.D.   On: 04/19/2022 02:57   MM 3D SCREEN BREAST BILATERAL  Result Date: 04/16/2022 CLINICAL DATA:  Screening. EXAM: DIGITAL SCREENING BILATERAL MAMMOGRAM WITH TOMOSYNTHESIS AND CAD TECHNIQUE: Bilateral screening digital craniocaudal and mediolateral oblique mammograms were  obtained. Bilateral screening digital breast tomosynthesis was performed. The images were evaluated with computer-aided detection. COMPARISON:  Previous exam(s). ACR Breast Density Category b: There are scattered areas of fibroglandular density. FINDINGS: In the right breast, a possible mass warrants further evaluation. In the left breast, no findings suspicious for malignancy. IMPRESSION: Further evaluation is suggested for a possible mass in the right breast. RECOMMENDATION: Diagnostic mammogram and possibly ultrasound of the right breast. (Code:[id number]) The patient will be contacted regarding the findings, and additional imaging will be scheduled. BI-RADS CATEGORY  0: Incomplete. Need additional imaging evaluation and/or prior mammograms for comparison. Electronically Signed   By: MAmmie FerrierM.D.   On: 04/16/2022 14:09   DG Bone Density  Result Date: 04/15/2022 EXAM: DUAL X-RAY ABSORPTIOMETRY (DXA) FOR BONE MINERAL DENSITY IMPRESSION: Referring Physician:  BETTY G JMartiniqueYour patient completed a bone mineral density test using GE Lunar iDXA system (analysis version: 16). Technologist: KAT PATIENT: Name: MJohara, LodwickPatient ID: [id number] Date: [birth year]Height: 61.7 in. Sex: Female Measured: 04/15/2022 Weight: 181.8  lbs. Indications: Advanced Age, Caucasian, COPD, Estrogen Deficient, Family Hist. (Parent hip fracture), History of Osteopenia, Hypothyroid, Hysterectomy, Levothyroxine, Postmenopausal Fractures: Left Foot, Left Hand, Right Foot Treatments: Calcium (E943.0), Estradiol Patch, Vitamin D (E933.5) ASSESSMENT: The BMD measured at Femur Neck Right is 0.820 g/cm2 with a T-score of -1.6. This patient is considered osteopenic/low bone mass according to [place] [facility]) criteria. The quality of the exam is good. L3, L4 were excluded due to degenerative changes and exclusion in the past. FRAX was not calculated because of current ERT use. Site Region Measured Date Measured Age  [age] BMD Significant CHANGE T-score DualFemur Neck Right 04/15/2021    71.8         -1.6    0.820 g/cm2 DualFemur Neck Right 03/07/2020    69.7         -1.6    0.816 g/cm2 AP Spine L1-L2 04/15/2022 71.8 0.4 1.209 g/cm2 * AP Spine  L1-L2      03/07/2020    69.7         -0.3    1.129 g/cm2 DualFemur Total Mean 04/15/2022    71.8         -0.7    0.919 g/cm2 DualFemur Total Mean 03/07/2020    69.7         -0.8    0.905 g/cm2 World Health Organization [facility]) criteria for post-menopausal, Caucasian Women: Normal       T-score at or above -1 SD Osteopenia   T-score between -1 and -2.5 SD Osteoporosis T-score at or below -2.5 SD RECOMMENDATION: 1. All patients should optimize calcium and vitamin D intake. 2. Consider FDA-approved medical therapies in postmenopausal women and men aged 67 years and older, based on the following: a. A hip or vertebral (clinical or morphometric) fracture. b. T-score = -2.5 at the femoral neck or spine after appropriate evaluation to exclude secondary causes. c. Low bone mass (T-score between -1.0 and -2.5 at the femoral neck or spine) and a 10-year probability of a hip fracture = 3% or a 10-year probability of a major osteoporosis-related fracture = 20% based on the [place]-adapted WHO algorithm. d. Clinician judgment and/or patient preferences may indicate treatment for people with 10-year fracture probabilities above or below these levels. FOLLOW-UP: Patients with diagnosis of osteoporosis or at high risk for fracture should have regular bone mineral density tests. Patients eligible for Medicare are allowed routine testing every 2 years. The testing frequency can be increased to one year for patients who have rapidly progressing disease, are receiving or discontinuing medical therapy to restore bone mass, or have additional risk factors. I have reviewed this study and agree with the findings. [facility] Electronically Signed   By: Ammie Ferrier M.D.   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 I recommend surgical resection lumpectomy with sentinel lymph node biopsy. Discussed about sending Oncotype DX to determine benefit of adjuvant chemotherapy. As well as plan of adjuvant radiation and adjuvant endocrine therapy. Patient has establish care with surgeon.   Family history of cancer Recommend genetic counseling for genetic testing.  Patient would like to defer until later.   She knows to notify me if she changes her mind..  Orders Placed This Encounter  Procedures   Cancer antigen 27.29    Standing  Status:   Future    Number of Occurrences:   1    Standing Expiration Date:   05/08/2023   Cancer antigen 15-3    Standing Status:   Future    Number of Occurrences:   1    Standing Expiration Date:   05/08/2023      All questions were answered. The patient knows to call the clinic with any problems, questions or concerns. No barriers to learning was detected.  Earlie Server, MD 05/07/2022

## 2022-05-07 NOTE — Assessment & Plan Note (Addendum)
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 I recommend surgical resection lumpectomy with sentinel lymph node biopsy. Discussed about sending Oncotype DX to determine benefit of adjuvant chemotherapy. As well as plan of adjuvant radiation and adjuvant endocrine therapy. Patient has establish care with surgeon.

## 2022-05-07 NOTE — Progress Notes (Signed)
Accompanied patient to initial medical oncology appointment.   Reviewed Breast Cancer treatment handbook.   Care plan summary given to patient.   Reviewed outreach programs and cancer center services.  

## 2022-05-08 LAB — CANCER ANTIGEN 27.29: CA 27.29: 22.3 U/mL (ref 0.0–38.6)

## 2022-05-08 LAB — CANCER ANTIGEN 15-3: CA 15-3: 21.3 U/mL (ref 0.0–25.0)

## 2022-05-10 ENCOUNTER — Other Ambulatory Visit: Payer: Self-pay

## 2022-05-10 ENCOUNTER — Other Ambulatory Visit: Payer: Self-pay | Admitting: Surgery

## 2022-05-10 ENCOUNTER — Telehealth: Payer: Self-pay

## 2022-05-10 ENCOUNTER — Ambulatory Visit: Payer: Self-pay | Admitting: Surgery

## 2022-05-10 DIAGNOSIS — C50211 Malignant neoplasm of upper-inner quadrant of right female breast: Secondary | ICD-10-CM

## 2022-05-10 DIAGNOSIS — C50911 Malignant neoplasm of unspecified site of right female breast: Secondary | ICD-10-CM

## 2022-05-10 NOTE — Progress Notes (Signed)
Order placed

## 2022-05-10 NOTE — Telephone Encounter (Signed)
Order placed

## 2022-05-11 ENCOUNTER — Encounter: Payer: Self-pay | Admitting: *Deleted

## 2022-05-11 ENCOUNTER — Telehealth: Payer: Self-pay | Admitting: Surgery

## 2022-05-11 NOTE — Progress Notes (Signed)
Kristina [name] reached out concerned about how far out surgery is scheduled.   I reached out to [facility] at [facility] and she said now that she knows when the RF tag will be placed she will see if there is any availability sooner.   I let Kristina [name] know what was said.

## 2022-05-11 NOTE — Telephone Encounter (Signed)
Patient has been advised of Pre-Admission date/time, and Surgery date at [facility].  Surgery Date: 05/24/22, patient to arrive at 8:00 am on this day as will be having SLN bx injection done first.   Preadmission Testing Date: 05/17/22 (phone 8am to 1:00 pm  Patient also reminded of tag placement at the [facility] on 05/13/22.

## 2022-05-13 ENCOUNTER — Ambulatory Visit
Admission: RE | Admit: 2022-05-13 | Discharge: 2022-05-13 | Disposition: A | Discharge status: Home / Self Care | Payer: PPO | Source: Ambulatory Visit | Attending: Surgery | Admitting: Surgery

## 2022-05-13 DIAGNOSIS — C50911 Malignant neoplasm of unspecified site of right female breast: Secondary | ICD-10-CM | POA: Diagnosis not present

## 2022-05-17 ENCOUNTER — Encounter
Admission: RE | Admit: 2022-05-17 | Discharge: 2022-05-17 | Disposition: A | Discharge status: Home / Self Care | Payer: PPO | Source: Ambulatory Visit | Attending: Surgery | Admitting: Surgery

## 2022-05-17 DIAGNOSIS — I10 Essential (primary) hypertension: Secondary | ICD-10-CM

## 2022-05-17 DIAGNOSIS — Z01818 Encounter for other preprocedural examination: Secondary | ICD-10-CM

## 2022-05-17 DIAGNOSIS — J449 Chronic obstructive pulmonary disease, unspecified: Secondary | ICD-10-CM

## 2022-05-17 HISTORY — DX: Nontoxic single thyroid nodule: E04.1

## 2022-05-17 HISTORY — DX: Dyspnea, unspecified: R06.00

## 2022-05-17 HISTORY — DX: Other specified postprocedural states: Z98.890

## 2022-05-17 HISTORY — DX: Nontoxic goiter, unspecified: E04.9

## 2022-05-17 HISTORY — DX: Other complications of anesthesia, initial encounter: T88.59XA

## 2022-05-17 HISTORY — DX: Nausea with vomiting, unspecified: R11.2

## 2022-05-17 HISTORY — DX: Malignant neoplasm of unspecified site of unspecified female breast: C50.919

## 2022-05-17 NOTE — Patient Instructions (Addendum)
Your procedure is scheduled on:05-24-22 Monday Report to the Registration Desk on the 1st floor of the [facility]. Then proceed to the Radiology Desk (2nd desk on right). Arrive at 8 AM  REMEMBER: Instructions that are not followed completely may result in serious medical risk, up to and including death; or upon the discretion of your surgeon and anesthesiologist your surgery may need to be rescheduled.  Do not eat food OR drink any liquids after midnight the night before surgery.  No gum chewing, lozengers or hard candies.  TAKE THESE MEDICATIONS THE MORNING OF SURGERY WITH A SIP OF WATER: -levothyroxine (SYNTHROID) -pantoprazole (PROTONIX) -PARoxetine (PAXIL) -propranolol (INDERAL)   Use your Albuterol Inhaler the day of surgery and bring your Albuterol Inhaler to the hospital  One week prior to surgery: Stop Anti-inflammatories (NSAIDS) such as Advil, Aleve, Ibuprofen, Motrin, Naproxen, Naprosyn and Aspirin based products such as Excedrin, Goodys Powder, BC Powder.You may however, continue to take Tylenol if needed for pain up until the day of surgery.  Stop ANY OVER THE COUNTER supplements/vitamins NOW (05-17-22) until after surgery (Viactiv, Vitamin D3, Magnesium, Mulitivitamin)  No Alcohol for 24 hours before or after surgery.  No Smoking including e-cigarettes for 24 hours prior to surgery.  No chewable tobacco products for at least 6 hours prior to surgery.  No nicotine patches on the day of surgery.  Do not use any "recreational" drugs for at least a week prior to your surgery.  Please be advised that the combination of cocaine and anesthesia may have negative outcomes, up to and including death. If you test positive for cocaine, your surgery will be cancelled.  On the morning of surgery brush your teeth with toothpaste and water, you may rinse your mouth with mouthwash if you wish. Do not swallow any toothpaste or mouthwash.  Use CHG Soap as directed on instruction  sheet.  Do not wear jewelry, make-up, hairpins, clips or nail polish.  Do not wear lotions, powders, or perfumes.   Do not shave body from the neck down 48 hours prior to surgery just in case you cut yourself which could leave a site for infection.  Also, freshly shaved skin may become irritated if using the CHG soap.  Contact lenses, hearing aids and dentures may not be worn into surgery.  Do not bring valuables to the hospital. [facility] is not responsible for any missing/lost belongings or valuables.   Notify your doctor if there is any change in your medical condition (cold, fever, infection).  Wear comfortable clothing (specific to your surgery type) to the hospital.  After surgery, you can help prevent lung complications by doing breathing exercises.  Take deep breaths and cough every 1-2 hours. Your doctor may order a device called an Incentive Spirometer to help you take deep breaths. When coughing or sneezing, hold a pillow firmly against your incision with both hands. This is called "splinting." Doing this helps protect your incision. It also decreases belly discomfort.  If you are being admitted to the hospital overnight, leave your suitcase in the car. After surgery it may be brought to your room.  If you are being discharged the day of surgery, you will not be allowed to drive home. You will need a responsible adult (18 years or older) to drive you home and stay with you that night.   If you are taking public transportation, you will need to have a responsible adult (18 years or older) with you. Please confirm with your physician that  it is acceptable to use public transportation.   Please call the [facility] at [phone] if you have any questions about these instructions.  Surgery Visitation Policy:  Patients undergoing a surgery or procedure may have two family members or support persons with them as long as the person is not COVID-19 positive  or experiencing its symptoms.

## 2022-05-19 ENCOUNTER — Encounter
Admission: RE | Admit: 2022-05-19 | Discharge: 2022-05-19 | Disposition: A | Discharge status: Home / Self Care | Payer: PPO | Source: Ambulatory Visit | Attending: Surgery | Admitting: Surgery

## 2022-05-19 DIAGNOSIS — I10 Essential (primary) hypertension: Secondary | ICD-10-CM | POA: Insufficient documentation

## 2022-05-19 DIAGNOSIS — J449 Chronic obstructive pulmonary disease, unspecified: Secondary | ICD-10-CM | POA: Insufficient documentation

## 2022-05-19 DIAGNOSIS — Z01818 Encounter for other preprocedural examination: Secondary | ICD-10-CM

## 2022-05-19 DIAGNOSIS — Z01810 Encounter for preprocedural cardiovascular examination: Secondary | ICD-10-CM | POA: Diagnosis not present

## 2022-05-23 MED ORDER — CEFAZOLIN SODIUM-DEXTROSE 2-4 GM/100ML-% IV SOLN
2.0000 g | INTRAVENOUS | Status: AC
  Administered 2022-05-24 (×2): 2 g via INTRAVENOUS

## 2022-05-23 MED ORDER — CHLORHEXIDINE GLUCONATE CLOTH 2 % EX PADS: 6.0000 | MEDICATED_PAD | Freq: Once | CUTANEOUS | Status: DC

## 2022-05-23 MED ORDER — BUPIVACAINE LIPOSOME 1.3 % IJ SUSP: 20.0000 mL | Freq: Once | INTRAMUSCULAR | Status: DC

## 2022-05-23 MED ORDER — CHLORHEXIDINE GLUCONATE CLOTH 2 % EX PADS
6.0000 | MEDICATED_PAD | Freq: Once | CUTANEOUS | Status: AC
  Administered 2022-05-24: 6 via TOPICAL

## 2022-05-23 MED ORDER — ACETAMINOPHEN 500 MG PO TABS: 1000.0000 mg | ORAL_TABLET | ORAL | Status: AC

## 2022-05-23 MED ORDER — GABAPENTIN 300 MG PO CAPS: 300.0000 mg | ORAL_CAPSULE | ORAL | Status: AC

## 2022-05-23 MED ORDER — CHLORHEXIDINE GLUCONATE 0.12 % MT SOLN: 15.0000 mL | Freq: Once | OROMUCOSAL | Status: AC

## 2022-05-23 MED ORDER — ORAL CARE MOUTH RINSE: 15.0000 mL | Freq: Once | OROMUCOSAL | Status: AC

## 2022-05-23 MED ORDER — LACTATED RINGERS IV SOLN: INTRAVENOUS | Status: DC

## 2022-05-24 ENCOUNTER — Ambulatory Visit
Admission: RE | Admit: 2022-05-24 | Discharge: 2022-05-24 | Disposition: A | Discharge status: Home / Self Care | Payer: PPO | Attending: Surgery | Admitting: Surgery

## 2022-05-24 ENCOUNTER — Ambulatory Visit: Payer: PPO | Admitting: Certified Registered"

## 2022-05-24 ENCOUNTER — Encounter
Admission: RE | Disposition: A | Discharge status: Home / Self Care | Payer: Self-pay | Source: Home / Self Care | Attending: Surgery

## 2022-05-24 ENCOUNTER — Ambulatory Visit
Admission: RE | Admit: 2022-05-24 | Discharge: 2022-05-24 | Disposition: A | Discharge status: Home / Self Care | Payer: PPO | Source: Ambulatory Visit | Attending: Surgery | Admitting: Surgery

## 2022-05-24 ENCOUNTER — Other Ambulatory Visit: Payer: Self-pay

## 2022-05-24 ENCOUNTER — Telehealth: Payer: Self-pay | Admitting: Surgery

## 2022-05-24 ENCOUNTER — Encounter: Payer: Self-pay | Admitting: Surgery

## 2022-05-24 DIAGNOSIS — Z79890 Hormone replacement therapy (postmenopausal): Secondary | ICD-10-CM | POA: Insufficient documentation

## 2022-05-24 DIAGNOSIS — Z87891 Personal history of nicotine dependence: Secondary | ICD-10-CM | POA: Diagnosis not present

## 2022-05-24 DIAGNOSIS — I10 Essential (primary) hypertension: Secondary | ICD-10-CM | POA: Diagnosis not present

## 2022-05-24 DIAGNOSIS — C50911 Malignant neoplasm of unspecified site of right female breast: Secondary | ICD-10-CM | POA: Insufficient documentation

## 2022-05-24 DIAGNOSIS — Z803 Family history of malignant neoplasm of breast: Secondary | ICD-10-CM | POA: Diagnosis not present

## 2022-05-24 DIAGNOSIS — Z9049 Acquired absence of other specified parts of digestive tract: Secondary | ICD-10-CM | POA: Insufficient documentation

## 2022-05-24 DIAGNOSIS — C50211 Malignant neoplasm of upper-inner quadrant of right female breast: Secondary | ICD-10-CM | POA: Diagnosis not present

## 2022-05-24 DIAGNOSIS — E039 Hypothyroidism, unspecified: Secondary | ICD-10-CM | POA: Insufficient documentation

## 2022-05-24 DIAGNOSIS — Z170 Estrogen receptor positive status [ER+]: Secondary | ICD-10-CM

## 2022-05-24 DIAGNOSIS — K219 Gastro-esophageal reflux disease without esophagitis: Secondary | ICD-10-CM | POA: Insufficient documentation

## 2022-05-24 DIAGNOSIS — Z85828 Personal history of other malignant neoplasm of skin: Secondary | ICD-10-CM | POA: Insufficient documentation

## 2022-05-24 DIAGNOSIS — J449 Chronic obstructive pulmonary disease, unspecified: Secondary | ICD-10-CM | POA: Insufficient documentation

## 2022-05-24 DIAGNOSIS — Z90710 Acquired absence of both cervix and uterus: Secondary | ICD-10-CM | POA: Diagnosis not present

## 2022-05-24 DIAGNOSIS — R928 Other abnormal and inconclusive findings on diagnostic imaging of breast: Secondary | ICD-10-CM | POA: Diagnosis not present

## 2022-05-24 DIAGNOSIS — J45909 Unspecified asthma, uncomplicated: Secondary | ICD-10-CM | POA: Diagnosis not present

## 2022-05-24 HISTORY — PX: BREAST LUMPECTOMY: SHX2

## 2022-05-24 HISTORY — PX: BREAST LUMPECTOMY,RADIO FREQ LOCALIZER,AXILLARY SENTINEL LYMPH NODE BIOPSY: SHX6900

## 2022-05-24 SURGERY — BREAST LUMPECTOMY,RADIO FREQ LOCALIZER,AXILLARY SENTINEL LYMPH NODE BIOPSY
Anesthesia: General | Laterality: Right

## 2022-05-24 MED ORDER — SODIUM CHLORIDE (PF) 0.9 % IJ SOLN
INTRAMUSCULAR | Status: AC
  Filled 2022-05-24: qty 20

## 2022-05-24 MED ORDER — ONDANSETRON HCL 4 MG/2ML IJ SOLN
INTRAMUSCULAR | Status: DC | PRN
  Administered 2022-05-24: 4 mg via INTRAVENOUS

## 2022-05-24 MED ORDER — OXYCODONE HCL 5 MG PO TABS: 5.0000 mg | ORAL_TABLET | Freq: Four times a day (QID) | ORAL | 0 refills | Status: DC | PRN

## 2022-05-24 MED ORDER — LIDOCAINE HCL (CARDIAC) PF 100 MG/5ML IV SOSY
PREFILLED_SYRINGE | INTRAVENOUS | Status: DC | PRN
  Administered 2022-05-24: 80 mg via INTRAVENOUS

## 2022-05-24 MED ORDER — TECHNETIUM TC 99M TILMANOCEPT KIT
1.0000 | PACK | Freq: Once | INTRAVENOUS | Status: AC
  Administered 2022-05-24: 1.06 via INTRADERMAL

## 2022-05-24 MED ORDER — DROPERIDOL 2.5 MG/ML IJ SOLN
INTRAMUSCULAR | Status: AC
  Administered 2022-05-24: 0.625 mg via INTRAVENOUS
  Filled 2022-05-24: qty 2

## 2022-05-24 MED ORDER — ISOSULFAN BLUE 1 % ~~LOC~~ SOLN
SUBCUTANEOUS | Status: AC
  Filled 2022-05-24: qty 5

## 2022-05-24 MED ORDER — DEXAMETHASONE SODIUM PHOSPHATE 10 MG/ML IJ SOLN
INTRAMUSCULAR | Status: DC | PRN
  Administered 2022-05-24: 5 mg via INTRAVENOUS

## 2022-05-24 MED ORDER — BUPIVACAINE-EPINEPHRINE (PF) 0.25% -1:200000 IJ SOLN
INTRAMUSCULAR | Status: DC | PRN
  Administered 2022-05-24: 10 mL

## 2022-05-24 MED ORDER — ONDANSETRON HCL 4 MG/2ML IJ SOLN
INTRAMUSCULAR | Status: AC
  Filled 2022-05-24: qty 2

## 2022-05-24 MED ORDER — CEFAZOLIN SODIUM 1 G IJ SOLR
INTRAMUSCULAR | Status: AC
  Filled 2022-05-24: qty 20

## 2022-05-24 MED ORDER — PROPOFOL 500 MG/50ML IV EMUL
INTRAVENOUS | Status: DC | PRN
  Administered 2022-05-24: 125 ug/kg/min via INTRAVENOUS

## 2022-05-24 MED ORDER — PROPOFOL 10 MG/ML IV BOLUS
INTRAVENOUS | Status: AC
  Filled 2022-05-24: qty 20

## 2022-05-24 MED ORDER — CHLORHEXIDINE GLUCONATE 0.12 % MT SOLN
OROMUCOSAL | Status: AC
  Administered 2022-05-24: 15 mL via OROMUCOSAL
  Filled 2022-05-24: qty 15

## 2022-05-24 MED ORDER — DROPERIDOL 2.5 MG/ML IJ SOLN: 0.6250 mg | Freq: Once | INTRAMUSCULAR | Status: AC

## 2022-05-24 MED ORDER — BUPIVACAINE-EPINEPHRINE (PF) 0.25% -1:200000 IJ SOLN
INTRAMUSCULAR | Status: AC
  Filled 2022-05-24: qty 30

## 2022-05-24 MED ORDER — BUPIVACAINE LIPOSOME 1.3 % IJ SUSP
INTRAMUSCULAR | Status: AC
  Filled 2022-05-24: qty 20

## 2022-05-24 MED ORDER — ACETAMINOPHEN 500 MG PO TABS
ORAL_TABLET | ORAL | Status: AC
  Administered 2022-05-24: 1000 mg via ORAL
  Filled 2022-05-24: qty 2

## 2022-05-24 MED ORDER — PROPOFOL 10 MG/ML IV BOLUS
INTRAVENOUS | Status: DC | PRN
  Administered 2022-05-24: 130 mg via INTRAVENOUS

## 2022-05-24 MED ORDER — IBUPROFEN 800 MG PO TABS: 800.0000 mg | ORAL_TABLET | Freq: Three times a day (TID) | ORAL | 0 refills | Status: DC | PRN

## 2022-05-24 MED ORDER — CEFAZOLIN SODIUM-DEXTROSE 2-3 GM-%(50ML) IV SOLR: INTRAVENOUS | Status: DC | PRN

## 2022-05-24 MED ORDER — GABAPENTIN 300 MG PO CAPS
ORAL_CAPSULE | ORAL | Status: AC
  Administered 2022-05-24: 300 mg via ORAL
  Filled 2022-05-24: qty 1

## 2022-05-24 MED ORDER — SCOPOLAMINE 1 MG/3DAYS TD PT72
MEDICATED_PATCH | TRANSDERMAL | Status: AC
  Filled 2022-05-24: qty 1

## 2022-05-24 MED ORDER — FENTANYL CITRATE (PF) 100 MCG/2ML IJ SOLN
25.0000 ug | INTRAMUSCULAR | Status: DC | PRN
  Administered 2022-05-24: 25 ug via INTRAVENOUS

## 2022-05-24 MED ORDER — FENTANYL CITRATE (PF) 100 MCG/2ML IJ SOLN
INTRAMUSCULAR | Status: AC
  Administered 2022-05-24: 25 ug via INTRAVENOUS
  Filled 2022-05-24: qty 2

## 2022-05-24 MED ORDER — SCOPOLAMINE 1 MG/3DAYS TD PT72
MEDICATED_PATCH | TRANSDERMAL | Status: DC | PRN
  Administered 2022-05-24: 1 via TRANSDERMAL

## 2022-05-24 MED ORDER — CEFAZOLIN SODIUM-DEXTROSE 2-4 GM/100ML-% IV SOLN
INTRAVENOUS | Status: AC
  Filled 2022-05-24: qty 100

## 2022-05-24 MED ORDER — FENTANYL CITRATE (PF) 100 MCG/2ML IJ SOLN
INTRAMUSCULAR | Status: DC | PRN
  Administered 2022-05-24: 12.5 ug via INTRAVENOUS
  Administered 2022-05-24 (×3): 25 ug via INTRAVENOUS
  Administered 2022-05-24: 12.5 ug via INTRAVENOUS

## 2022-05-24 MED ORDER — FENTANYL CITRATE (PF) 100 MCG/2ML IJ SOLN
INTRAMUSCULAR | Status: AC
  Filled 2022-05-24: qty 2

## 2022-05-24 MED ORDER — ISOSULFAN BLUE 1 % ~~LOC~~ SOLN
SUBCUTANEOUS | Status: DC | PRN
  Administered 2022-05-24: 5 mL via SUBCUTANEOUS

## 2022-05-24 MED ORDER — LIDOCAINE HCL (PF) 2 % IJ SOLN
INTRAMUSCULAR | Status: AC
  Filled 2022-05-24: qty 5

## 2022-05-24 MED ORDER — DEXAMETHASONE SODIUM PHOSPHATE 10 MG/ML IJ SOLN
INTRAMUSCULAR | Status: AC
  Filled 2022-05-24: qty 1

## 2022-05-24 MED ORDER — PROPOFOL 1000 MG/100ML IV EMUL
INTRAVENOUS | Status: AC
  Filled 2022-05-24: qty 100

## 2022-05-24 MED ORDER — STERILE WATER FOR IRRIGATION IR SOLN
Status: DC | PRN
  Administered 2022-05-24: 500 mL

## 2022-05-24 MED ORDER — ONDANSETRON HCL 4 MG/2ML IJ SOLN: 4.0000 mg | Freq: Once | INTRAMUSCULAR | Status: DC | PRN

## 2022-05-24 SURGICAL SUPPLY — 46 items
ADH SKN CLS APL DERMABOND .7 (GAUZE/BANDAGES/DRESSINGS) ×1
APL PRP STRL LF DISP 70% ISPRP (MISCELLANEOUS) ×1
APPLIER CLIP 9.375 SM OPEN (CLIP) ×1
APR CLP SM 9.3 20 MLT OPN (CLIP) ×1
BLADE SURG 15 STRL LF DISP TIS (BLADE) ×1 IMPLANT
BLADE SURG 15 STRL SS (BLADE) ×1
CHLORAPREP W/TINT 26 (MISCELLANEOUS) ×1 IMPLANT
CIV-Flex Guide Sheath IMPLANT
CLIP APPLIE 9.375 SM OPEN (CLIP) IMPLANT
CNTNR SPEC 2.5X3XGRAD LEK (MISCELLANEOUS)
CONT SPEC 4OZ STER OR WHT (MISCELLANEOUS)
CONT SPEC 4OZ STRL OR WHT (MISCELLANEOUS)
CONTAINER SPEC 2.5X3XGRAD LEK (MISCELLANEOUS) IMPLANT
COVER PROBE GAMMA FINDER SLV (MISCELLANEOUS) ×1 IMPLANT
DERMABOND ADVANCED .7 DNX12 (GAUZE/BANDAGES/DRESSINGS) ×1 IMPLANT
DEVICE DUBIN SPECIMEN MAMMOGRA (MISCELLANEOUS) ×1 IMPLANT
DRAPE LAPAROTOMY TRNSV 106X77 (MISCELLANEOUS) ×1 IMPLANT
ELECT CAUTERY BLADE TIP 2.5 (TIP) ×1
ELECT REM PT RETURN 9FT ADLT (ELECTROSURGICAL) ×1
ELECTRODE CAUTERY BLDE TIP 2.5 (TIP) ×1 IMPLANT
ELECTRODE REM PT RTRN 9FT ADLT (ELECTROSURGICAL) ×1 IMPLANT
GAUZE 4X4 16PLY ~~LOC~~+RFID DBL (SPONGE) ×1 IMPLANT
GLOVE ORTHO TXT STRL SZ7.5 (GLOVE) ×1 IMPLANT
GOWN STRL REUS W/ TWL LRG LVL3 (GOWN DISPOSABLE) ×1 IMPLANT
GOWN STRL REUS W/ TWL XL LVL3 (GOWN DISPOSABLE) ×1 IMPLANT
GOWN STRL REUS W/TWL LRG LVL3 (GOWN DISPOSABLE) ×1
GOWN STRL REUS W/TWL XL LVL3 (GOWN DISPOSABLE) ×1
KIT MARKER MARGIN INK (KITS) IMPLANT
KIT TURNOVER KIT A (KITS) ×1 IMPLANT
MANIFOLD NEPTUNE II (INSTRUMENTS) ×1 IMPLANT
NEEDLE HYPO 22GX1.5 SAFETY (NEEDLE) ×1 IMPLANT
PACK BASIN MINOR ARMC (MISCELLANEOUS) ×1 IMPLANT
SET LOCALIZER 20 PROBE US (MISCELLANEOUS) ×1 IMPLANT
SPIKE FLUID TRANSFER (MISCELLANEOUS) ×1 IMPLANT
SUT MNCRL 4-0 (SUTURE) ×2
SUT MNCRL 4-0 27XMFL (SUTURE) ×2
SUT SILK 2 0 SH (SUTURE) IMPLANT
SUT VIC AB 3-0 SH 27 (SUTURE) ×2
SUT VIC AB 3-0 SH 27X BRD (SUTURE) ×1 IMPLANT
SUTURE MNCRL 4-0 27XMF (SUTURE) ×1 IMPLANT
SYR 10ML LL (SYRINGE) ×1 IMPLANT
SYR 20ML LL LF (SYRINGE) ×1 IMPLANT
TRAP FLUID SMOKE EVACUATOR (MISCELLANEOUS) ×1 IMPLANT
TRAP NEPTUNE SPECIMEN COLLECT (MISCELLANEOUS) ×1 IMPLANT
WATER STERILE IRR 1000ML POUR (IV SOLUTION) ×1 IMPLANT
WATER STERILE IRR 500ML POUR (IV SOLUTION) ×1 IMPLANT

## 2022-05-24 NOTE — Telephone Encounter (Signed)
Kristina [name] with [name] called and they can't fill the Oxycodone as patient is already taking and getting prescriptions for Xanax and Flexeril.  The Oxy will put patient at high risk.  Asking for alternative.  Please call pharmacy at [phone].  Thank you.

## 2022-05-24 NOTE — Transfer of Care (Signed)
Immediate Anesthesia Transfer of Care Note  Patient: Kristina [name]  Procedure(s) Performed: BREAST LUMPECTOMY,RADIO FREQ LOCALIZER,AXILLARY SENTINEL LYMPH NODE BIOPSY (Right)  Patient Location: PACU  Anesthesia Type:General  Level of Consciousness: awake, alert  and oriented  Airway & Oxygen Therapy: Patient Spontanous Breathing and Patient connected to face mask oxygen  Post-op Assessment: Report given to RN and Post -op Vital signs reviewed and stable  Post vital signs: stable  Last Vitals:  Vitals Value Taken Time  BP 104/85 05/24/22 1251  Temp    Pulse 70 05/24/22 1255  Resp 20 05/24/22 1255  SpO2 99 % 05/24/22 1255  Vitals shown include unvalidated device data.  Last Pain:  Vitals:   05/24/22 0857  TempSrc: Oral  PainSc: 0-No pain         Complications: No notable events documented.

## 2022-05-24 NOTE — Interval H&P Note (Signed)
History and Physical Interval Note:  05/24/2022 9:16 AM  Kristina [name]  has presented today for surgery, with the diagnosis of right breast cancer.  The various methods of treatment have been discussed with the patient and family. After consideration of risks, benefits and other options for treatment, the patient has consented to  Procedure(s): [place] (Right) as a surgical intervention.  The patient's history has been reviewed, patient examined, no change in status, stable for surgery.  I have reviewed the patient's chart and labs.  Questions were answered to the patient's satisfaction.   The right breast is marked.   Ronny Bacon

## 2022-05-24 NOTE — Discharge Instructions (Signed)
AMBULATORY SURGERY  ?DISCHARGE INSTRUCTIONS ? ? ?The drugs that you were given will stay in your system until tomorrow so for the next 24 hours you should not: ? ?Drive an automobile ?Make any legal decisions ?Drink any alcoholic beverage ? ? ?You may resume regular meals tomorrow.  Today it is better to start with liquids and gradually work up to solid foods. ? ?You may eat anything you prefer, but it is better to start with liquids, then soup and crackers, and gradually work up to solid foods. ? ? ?Please notify your doctor immediately if you have any unusual bleeding, trouble breathing, redness and pain at the surgery site, drainage, fever, or pain not relieved by medication. ? ? ? ?Additional Instructions: ? ? ? ?Please contact your physician with any problems or Same Day Surgery at [phone], Monday through Friday 6 am to 4 pm, or [place] [facility] [place] Main number at [phone].  ?

## 2022-05-24 NOTE — Telephone Encounter (Signed)
Spoke with the pharmacy and they will have the patient hold her Flexeril while taking the Oxycodone.

## 2022-05-24 NOTE — Anesthesia Preprocedure Evaluation (Signed)
Anesthesia Evaluation  Patient identified by MRN, date of birth, ID band Patient awake    Reviewed: Allergy & Precautions, NPO status , Patient's Chart, lab work & pertinent test results  History of Anesthesia Complications (+) PONV and history of anesthetic complications  Airway Mallampati: III  TM Distance: <3 FB Neck ROM: Full    Dental  (+) Caps, Dental Advisory Given,    Pulmonary neg pulmonary ROS, asthma , former smoker,    Pulmonary exam normal breath sounds clear to auscultation       Cardiovascular Exercise Tolerance: Good hypertension, Pt. on medications negative cardio ROS Normal cardiovascular exam Rhythm:Regular Rate:Normal     Neuro/Psych  Headaches, Anxiety Depression negative neurological ROS  negative psych ROS   GI/Hepatic negative GI ROS, Neg liver ROS, GERD  Medicated,  Endo/Other  negative endocrine ROSHypothyroidism   Renal/GU negative Renal ROS  negative genitourinary   Musculoskeletal   Abdominal Normal abdominal exam  (+)   Peds negative pediatric ROS (+)  Hematology negative hematology ROS (+)   Anesthesia Other Findings Past Medical History: No date: Actinic keratosis No date: Anxiety No date: Arthritis No date: Back pain No date: Breast cancer (HCC) No date: Cancer ([place])     Comment:  basal and squamous cell skin No date: Chicken pox No date: Chronic back pain No date: Complication of anesthesia     Comment:  hard to wake up after lap cho-had to stay overnight-bp               and hr dropped No date: COPD (chronic obstructive pulmonary disease) ([place]) No date: Depression No date: Dyspnea     Comment:  due to copd No date: Gallbladder problem No date: GERD (gastroesophageal reflux disease) No date: Goiter No date: Hypertension No date: Hypothyroidism No date: Joint pain No date: Lactose intolerance No date: Migraines No date: Osteoarthritis No date: PONV (postoperative  nausea and vomiting)     Comment:  very nauseated No date: SOB (shortness of breath) 2009: 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, Mohs No date: Thyroid disease No date: Thyroid nodule No date: Tremor No date: Vitamin D deficiency  Past Surgical History: 1978: ABDOMINAL HYSTERECTOMY 12/20/2017: CATARACT EXTRACTION; Left     Comment:  will have the right one completed a month later  No date: CATARACT EXTRACTION; Right 2000: CHOLECYSTECTOMY 2007: COLONOSCOPY 08/19/2017: JOINT REPLACEMENT; Right     Comment:  great toe  No date: TOE SURGERY 2007,2014: UPPER GASTROINTESTINAL ENDOSCOPY     Reproductive/Obstetrics negative OB ROS                             Anesthesia Physical Anesthesia Plan  ASA: 2  Anesthesia Plan: General   Post-op Pain Management:    Induction: Intravenous  PONV Risk Score and Plan: 2 and Ondansetron and Dexamethasone  Airway Management Planned: LMA  Additional Equipment:   Intra-op Plan:   Post-operative Plan: Extubation in OR  Informed Consent: I have reviewed the patients History and Physical, chart, labs and discussed the procedure including the risks, benefits and alternatives for the proposed anesthesia with the patient or authorized representative who has indicated his/her understanding and acceptance.     Dental Advisory Given  Plan Discussed with: CRNA and Surgeon  Anesthesia Plan Comments:         Anesthesia Quick Evaluation

## 2022-05-24 NOTE — Op Note (Signed)
Pre-operative Diagnosis: Right breast Cancer   Post-operative Diagnosis: Same  Surgeon: Ronny Bacon, M.D., [facility]  Anesthesia: General  Procedure: Right lumpectomy, Scout tag directed, sentinel node biopsy  Procedure Details  The patient was seen again in the Holding Room. The benefits, complications, treatment options, and expected outcomes were discussed with the patient. The risks of bleeding, infection, recurrence of symptoms, failure to resolve symptoms, hematoma, seroma, open wound, cosmetic deformity, and the need for further surgery were discussed.  The patient was taken to Operating Room, identified as Kristina [name] and the procedure verified.  A Time Out was held and the above information confirmed.  Prior to the induction of general anesthesia, antibiotic prophylaxis was administered. VTE prophylaxis was in place. The patient was positioned in the supine position. Appropriate anesthesia was then administered and tolerated well. The LOCALizer is used to mark the skin for incision.  A visual dye Isosulfan Blue 4 ml was injected periareolar dermis early under aseptic conditions.  Massage was administered to this area for 5 minutes prior to securely taping it.  The chest was prepped with Chloraprep and draped in the sterile fashion.  Then using the hand-held probe an area of high counts was identified in the axilla, an incision was made and direction by the probe aided in dissection of a lymph node(s) which was sent for permanent section.  The hot node was blue, I believe there was an adjacent node as part of the specimen.  No other blue tracer noted, background counts were at 10%.  I noted that unfortunately during the maintenance of hemostasis, that a skin burn was caused to the upper outer quadrant of the same breast.  I believe there must of been some arc coming from one of the metal retractors that must of made contact with the cautery. The axillary incision was closed after  irrigation and confirming adequate hemostasis with interrupted 3-0 Vicryl dermal sutures followed by 4-0 subcuticular Monocryl sutures.  Attention was turned to the [facility] tag localization site where an incision was made. Dissection using the infrared/reflector probe to perform a lumpectomy with adequate margins was performed.  Unfortunately I shortened my inferior margin, so the first larger specimen and elected to remove Scout reflector.  I took the adjacent wedge of tissue that was previously circumscribed, thus completing the excision.   This was done with sharp dissection with scissors. There was minimal bleeding, and the cavity packed.  The specimen was taken to the back table and painted to demarcate the 6 surfaces of potential margin.  I then was careful to not paint margins on the intervening surfaces.  I returned to the cavity to remove the packing, and hemostasis was confirmed with electrocautery.   Once assuring that hemostasis was adequate and checked multiple times the wound was closed with interrupted 3-0 Vicryl followed by 4-0 subcuticular Monocryl sutures.  Dermabond is utilized to seal the incisions, and as a barrier to dress the linear strip of thermal injury that is radially oriented in the upper outer quadrant of the right breast..    Findings: Faxitron imaging: Confirmed the markers to be within the 2 specimens as they are juxtaposed.  Estimated Blood Loss: Minimal         Drains: None         Specimens: Right sentinel lymph node, with likely secondary node adjacent to it. Medial right breast tissue, and 2 specimens.  I did not paint the intervening surfaces between the 2 specimens but all of  the total 6 surfaces were painted.       Complications: Thermal injury cyst to the dermis of the upper outer right breast skin.         Condition: Stable  Sentinel Node Biopsy Synoptic Operative Report  Operation performed with curative intent:Yes  Tracer(s) used to identify sentinel  nodes in the upfront surgery (non-neoadjuvant) setting (select all that apply):Dye and Radioactive Tracer  Tracer(s) used to identify sentinel nodes in the neoadjuvant setting (select all that apply):N/A  All nodes (colored or non-colored) present at the end of a dye-filled lymphatic channel were removed:Yes   All significantly radioactive nodes were removed:Yes  All palpable suspicious nodes were removed:Yes  Biopsy-proven positive nodes marked with clips prior to chemotherapy were identified and removed:N/A    Ronny Bacon, M.D., [facility]  05/24/2022 ; 12:53 PM

## 2022-05-24 NOTE — Anesthesia Procedure Notes (Signed)
Procedure Name: LMA Insertion Date/Time: 05/24/2022 10:16 AM  Performed by: Natasha Mead, CRNAPre-anesthesia Checklist: Patient identified, Suction available, Emergency Drugs available, Patient being monitored and Timeout performed Patient Re-evaluated:Patient Re-evaluated prior to induction Oxygen Delivery Method: Circle system utilized Preoxygenation: Pre-oxygenation with 100% oxygen Induction Type: IV induction Ventilation: Mask ventilation without difficulty LMA: LMA inserted LMA Size: 4.0 Tube size: 4.0 mm Number of attempts: 1 Tube secured with: Tape Dental Injury: Teeth and Oropharynx as per pre-operative assessment

## 2022-05-25 ENCOUNTER — Encounter: Payer: Self-pay | Admitting: Family Medicine

## 2022-05-25 ENCOUNTER — Encounter: Payer: Self-pay | Admitting: Surgery

## 2022-05-27 ENCOUNTER — Other Ambulatory Visit: Payer: Self-pay | Admitting: Family Medicine

## 2022-05-27 DIAGNOSIS — G4700 Insomnia, unspecified: Secondary | ICD-10-CM

## 2022-05-28 ENCOUNTER — Other Ambulatory Visit: Payer: PPO

## 2022-05-28 ENCOUNTER — Ambulatory Visit: Payer: PPO | Admitting: Family Medicine

## 2022-05-28 LAB — SURGICAL PATHOLOGY

## 2022-05-28 NOTE — Telephone Encounter (Signed)
-   last OV 03/31/22 - last filled 03/24/2022

## 2022-05-31 ENCOUNTER — Encounter: Payer: Self-pay | Admitting: Surgery

## 2022-05-31 NOTE — Anesthesia Postprocedure Evaluation (Signed)
Anesthesia Post Note  Patient: Kristina [name]  Procedure(s) Performed: BREAST LUMPECTOMY,RADIO FREQ LOCALIZER,AXILLARY SENTINEL LYMPH NODE BIOPSY (Right)  Patient location during evaluation: PACU Anesthesia Type: General Level of consciousness: awake and oriented Pain management: pain level controlled Vital Signs Assessment: post-procedure vital signs reviewed and stable Respiratory status: spontaneous breathing and respiratory function stable Cardiovascular status: stable Anesthetic complications: no   No notable events documented.   Last Vitals:  Vitals:   05/24/22 1400 05/24/22 1423  BP: 110/72 (!) 142/75  Pulse: (!) 55 62  Resp: 11 14  Temp:  (!) 36.2 C  SpO2: 91% 98%    Last Pain:  Vitals:   05/25/22 0849  TempSrc:   PainSc: 2                  VAN STAVEREN,Lavi Sheehan

## 2022-06-01 ENCOUNTER — Telehealth: Payer: Self-pay | Admitting: Surgery

## 2022-06-01 ENCOUNTER — Encounter: Payer: Self-pay | Admitting: *Deleted

## 2022-06-01 ENCOUNTER — Encounter: Payer: Self-pay | Admitting: Surgery

## 2022-06-01 ENCOUNTER — Ambulatory Visit (INDEPENDENT_AMBULATORY_CARE_PROVIDER_SITE_OTHER): Payer: PPO | Admitting: Surgery

## 2022-06-01 VITALS — BP 137/85 | HR 84 | Temp 98.1°F | Wt 185.8 lb

## 2022-06-01 DIAGNOSIS — C50211 Malignant neoplasm of upper-inner quadrant of right female breast: Secondary | ICD-10-CM

## 2022-06-01 DIAGNOSIS — Z08 Encounter for follow-up examination after completed treatment for malignant neoplasm: Secondary | ICD-10-CM

## 2022-06-01 DIAGNOSIS — Z170 Estrogen receptor positive status [ER+]: Secondary | ICD-10-CM

## 2022-06-01 DIAGNOSIS — Z9889 Other specified postprocedural states: Secondary | ICD-10-CM

## 2022-06-01 NOTE — Telephone Encounter (Signed)
Incoming call from patient, she had right breast lumpectomy done on 05/24/22.  Since has been doing okay, there is no pain but in the past few days has noticed swollen and heavy like feeling under her arm and the breast feeling heavy as well.  No fever, chills, nausea or vomiting.  Please call. Thank you.

## 2022-06-01 NOTE — Progress Notes (Signed)
Oncotype Dx order submitted online on surgical pathology [phone].   Order ID  [id number]

## 2022-06-01 NOTE — Patient Instructions (Signed)
If you have any concerns or questions, please feel free to call our office.   Lumpectomy, Care After The following information offers guidance on how to care for yourself after your procedure. Your health care provider may also give you more specific instructions. If you have problems or questions, contact your health care provider. What can I expect after the procedure? After the procedure, it is common to have: Some pain or redness at the incision site. Breast swelling. Breast tenderness. Stiffness in your arm or shoulder. A change in the shape and feel of your breast. Scar tissue that feels hard to the touch in the area where the lump was removed. Follow these instructions at home: Medicines Take over-the-counter and prescription medicines only as told by your health care provider. If you were prescribed an antibiotic, take it as told by your health care provider. Do not stop taking the antibiotic even if you start to feel better. Ask your health care provider if the medicine prescribed to you: Requires you to avoid driving or using machinery. Can cause constipation. You may need to take these actions to prevent or treat constipation: Drink enough fluid to keep your urine pale yellow. Take over-the-counter or prescription medicines. Eat foods that are high in fiber, such as beans, whole grains, and fresh fruits and vegetables. Limit foods that are high in fat and processed sugars, such as fried or sweet foods. Incision care     Follow instructions from your health care provider about how to take care of your incision. Make sure you: Wash your hands with soap and water for at least 20 seconds before and after you change your bandage (dressing). If soap and water are not available, use hand sanitizer. Change your dressing as told by your health care provider. Leave stitches (sutures), skin glue, or adhesive strips in place. These skin closures may need to stay in place for 2 weeks or  longer. If adhesive strip edges start to loosen and curl up, you may trim the loose edges. Do not remove adhesive strips completely unless your health care provider tells you to do that. Check your incision area every day for signs of infection. Check for: More redness, swelling, or pain. Fluid or blood. Warmth. Pus or a bad smell. Keep your dressing clean and dry. If you were sent home with a surgical drain in place, follow instructions from your health care provider about emptying it. Bathing Do not take baths, swim, or use a hot tub until your health care provider approves. Ask your health care provider if you may take showers. You may only be allowed to take sponge baths. Activity Rest as told by your health care provider. Do not sit for a long time without moving. Get up to take short walks every 1-2 hours. This will improve blood flow and breathing. Ask for help if you feel weak or unsteady. Be careful to avoid any activities that could cause an injury to your arm on the side of your surgery. Do not lift anything that is heavier than 10 lb (4.5 kg), or the limit that you are told, until your health care provider says that it is safe. Avoid lifting with the arm that is on the side of your surgery. Do not carry heavy objects on your shoulder on the side of your surgery. Do exercises to keep your shoulder and arm from getting stiff and swollen. Talk with your health care provider about which exercises are safe for you. Return to your   normal activities as told by your health care provider. Ask your health care provider what activities are safe for you. General instructions Wear a supportive bra as told by your health care provider. Raise (elevate) your arm above the level of your heart while you are sitting or lying down. Do not wear tight jewelry on your arm, wrist, or fingers on the side of your surgery. Wear compression stockings as told by your health care provider. These stockings help  to prevent blood clots and reduce swelling in your legs. If you had any lymph nodes removed during your procedure, be sure to tell all of your health care providers. It is important to share this information before you have certain procedures, such as blood tests or blood pressure measurements. Keep all follow-up visits. You may need to be screened for extra fluid around the lymph nodes and swelling in the breast and arm (lymphedema). Contact a health care provider if: You develop a rash. You have a fever. Your pain worsens or pain medicine is not working. You have swelling, weakness, or numbness in your arm that does not improve after a few weeks. You have new swelling in your breast. You have any of these signs of infection: More redness, swelling, or pain in your incision area. Fluid or blood coming from your incision. Warmth coming from the incision area. Pus or a bad smell coming from your incision. Get help right away if: You have very bad pain in your breast or arm. You have swelling in your legs or arms. You have redness, warmth, or pain in your leg or arm. You have chest pain. You have difficulty breathing. These symptoms may be an emergency. Get help right away. Call 911. Do not wait to see if the symptoms will go away. Do not drive yourself to the hospital. Summary After the procedure, it is common to have breast tenderness, swelling in your breast, and stiffness in your arm and shoulder. Follow instructions from your health care provider about how to take care of your incision. Do not lift anything that is heavier than 10 lb (4.5 kg), or the limit that you are told, until your health care provider says that it is safe. Avoid lifting with the arm that is on the side of your surgery. If you had any lymph nodes removed during your procedure, be sure to tell all of your health care providers. This information is not intended to replace advice given to you by your health care  provider. Make sure you discuss any questions you have with your health care provider. Document Revised: 10/04/2021 Document Reviewed: 10/04/2021 Elsevier Patient Education  2023 [employer]  
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
## 2022-06-03 ENCOUNTER — Encounter: Payer: PPO | Admitting: Surgery

## 2022-06-04 ENCOUNTER — Other Ambulatory Visit: Payer: PPO

## 2022-06-08 ENCOUNTER — Encounter: Payer: Self-pay | Admitting: *Deleted

## 2022-06-08 NOTE — Progress Notes (Signed)
Prior auth for oncotype dx and clinicals faxed to [employer].  Fax [phone].
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
## 2022-06-10 ENCOUNTER — Encounter: Payer: Self-pay | Admitting: Surgery

## 2022-06-10 ENCOUNTER — Other Ambulatory Visit: Payer: Self-pay

## 2022-06-10 ENCOUNTER — Encounter: Payer: Self-pay | Admitting: Oncology

## 2022-06-10 ENCOUNTER — Ambulatory Visit (INDEPENDENT_AMBULATORY_CARE_PROVIDER_SITE_OTHER): Payer: PPO | Admitting: Surgery

## 2022-06-10 VITALS — BP 135/82 | HR 78 | Temp 98.2°F | Ht 62.0 in | Wt 185.0 lb

## 2022-06-10 DIAGNOSIS — Z08 Encounter for follow-up examination after completed treatment for malignant neoplasm: Secondary | ICD-10-CM

## 2022-06-10 DIAGNOSIS — C50211 Malignant neoplasm of upper-inner quadrant of right female breast: Secondary | ICD-10-CM

## 2022-06-10 DIAGNOSIS — Z170 Estrogen receptor positive status [ER+]: Secondary | ICD-10-CM | POA: Diagnosis not present

## 2022-06-10 DIAGNOSIS — Z9889 Other specified postprocedural states: Secondary | ICD-10-CM

## 2022-06-10 NOTE — Patient Instructions (Addendum)
Apply Triple Antibiotic ointment and a band aid daily until healed.  We will contact you in 6 months to schedule an appointment.   GENERAL POST-OPERATIVE PATIENT INSTRUCTIONS   WOUND CARE INSTRUCTIONS:  Keep a dry clean dressing on the wound if there is drainage. The initial bandage may be removed after 24 hours.  Once the wound has quit draining you may leave it open to air.  If clothing rubs against the wound or causes irritation and the wound is not draining you may cover it with a dry dressing during the daytime.  Try to keep the wound dry and avoid ointments on the wound unless directed to do so.  If the wound becomes bright red and painful or starts to drain infected material that is not clear, please contact your physician immediately.  If the wound is mildly pink and has a thick firm ridge underneath it, this is normal, and is referred to as a healing ridge.  This will resolve over the next 4-6 weeks.  BATHING: You may shower if you have been informed of this by your surgeon. However, Please do not submerge in a tub, hot tub, or pool until incisions are completely sealed or have been told by your surgeon that you may do so.  DIET:  You may eat any foods that you can tolerate.  It is a good idea to eat a high fiber diet and take in plenty of fluids to prevent constipation.  If you do become constipated you may want to take a mild laxative or take ducolax tablets on a daily basis until your bowel habits are regular.  Constipation can be very uncomfortable, along with straining, after recent surgery.  ACTIVITY:  You are encouraged to cough and deep breath or use your incentive spirometer if you were given one, every 15-30 minutes when awake.  This will help prevent respiratory complications and low grade fevers post-operatively if you had a general anesthetic.  You may want to hug a pillow when coughing and sneezing to add additional support to the surgical area, if you had abdominal or chest  surgery, which will decrease pain during these times.  You are encouraged to walk and engage in light activity for the next two weeks.  You should not lift more than 20 pounds for 6 weeks total after surgery as it could put you at increased risk for complications.  Twenty pounds is roughly equivalent to a plastic bag of groceries. At that time- Listen to your body when lifting, if you have pain when lifting, stop and then try again in a few days. Soreness after doing exercises or activities of daily living is normal as you get back in to your normal routine.  MEDICATIONS:  Try to take narcotic medications and anti-inflammatory medications, such as tylenol, ibuprofen, naprosyn, etc., with food.  This will minimize stomach upset from the medication.  Should you develop nausea and vomiting from the pain medication, or develop a rash, please discontinue the medication and contact your physician.  You should not drive, make important decisions, or operate machinery when taking narcotic pain medication.  SUNBLOCK Use sun block to incision area over the next year if this area will be exposed to sun. This helps decrease scarring and will allow you avoid a permanent darkened area over your incision.  QUESTIONS:  Please feel free to call our office if you have any questions, and we will be glad to assist you. [phone]

## 2022-06-15 ENCOUNTER — Encounter: Payer: Self-pay | Admitting: Oncology

## 2022-06-15 ENCOUNTER — Encounter: Payer: Self-pay | Admitting: *Deleted

## 2022-06-15 ENCOUNTER — Inpatient Hospital Stay: Payer: PPO | Attending: Oncology | Admitting: Oncology

## 2022-06-15 VITALS — BP 124/78 | HR 81 | Temp 96.6°F | Wt 187.0 lb

## 2022-06-15 DIAGNOSIS — Z87891 Personal history of nicotine dependence: Secondary | ICD-10-CM | POA: Diagnosis not present

## 2022-06-15 DIAGNOSIS — Z90710 Acquired absence of both cervix and uterus: Secondary | ICD-10-CM | POA: Diagnosis not present

## 2022-06-15 DIAGNOSIS — Z85828 Personal history of other malignant neoplasm of skin: Secondary | ICD-10-CM | POA: Insufficient documentation

## 2022-06-15 DIAGNOSIS — C50911 Malignant neoplasm of unspecified site of right female breast: Secondary | ICD-10-CM | POA: Diagnosis not present

## 2022-06-15 DIAGNOSIS — Z8052 Family history of malignant neoplasm of bladder: Secondary | ICD-10-CM | POA: Diagnosis not present

## 2022-06-15 DIAGNOSIS — Z800 Family history of malignant neoplasm of digestive organs: Secondary | ICD-10-CM | POA: Insufficient documentation

## 2022-06-15 DIAGNOSIS — Z809 Family history of malignant neoplasm, unspecified: Secondary | ICD-10-CM

## 2022-06-15 DIAGNOSIS — C50211 Malignant neoplasm of upper-inner quadrant of right female breast: Secondary | ICD-10-CM | POA: Diagnosis not present

## 2022-06-15 DIAGNOSIS — Z7189 Other specified counseling: Secondary | ICD-10-CM | POA: Diagnosis not present

## 2022-06-15 DIAGNOSIS — Z170 Estrogen receptor positive status [ER+]: Secondary | ICD-10-CM | POA: Diagnosis not present

## 2022-06-15 DIAGNOSIS — I10 Essential (primary) hypertension: Secondary | ICD-10-CM | POA: Diagnosis not present

## 2022-06-15 DIAGNOSIS — Z803 Family history of malignant neoplasm of breast: Secondary | ICD-10-CM | POA: Diagnosis not present

## 2022-06-15 NOTE — Progress Notes (Signed)
Hematology/Oncology Consult Note [phone] [phone]     REFERRING PROVIDER: Martinique, Betty G, MD   Patient Care Team: Martinique, Betty G, MD as PCP - General (Family Medicine) Viona Gilmore, [facility] as Pharmacist (Pharmacist) Daiva Huge, RN as Oncology Nurse Navigator    ASSESSMENT & PLAN:   Cancer Staging  Breast cancer in female [facility]) Staging form: Breast, AJCC 8th Edition - Clinical stage from 05/07/2022: Stage IA (cT1c, cN0, cM0, G2, ER+, PR+, HER2-) - Signed by Earlie Server, MD on 05/07/2022   Breast cancer in female [facility]) Right breast invasive carcinoma, 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  Rationale was discussed with patient. Chemotherapy education was provided.  We had discussed the composition of chemotherapy regimen, length of chemo cycle, duration of treatment and the time to assess response to treatment.  I explained to the patient the risks and benefits of chemotherapy including all but not limited to hair loss, mouth sore, nausea, vomiting, diarrhea, low blood counts, bleeding, neuropathy and risk of life threatening infection and even death, secondary malignancy etc.  . Patient is undecided today and prefers to think about and update me. If she decides to to proceed with chemotherapy Chemotherapy education; refer to surgery for Medi- port placement. Antiemetics-Zofran and Compazine; EMLA cream will be sent to pharmacy  Discussed with patient about future plan of adjuvant radiation and adjuvant endocrine therapy.    Family history of cancer Recommend genetic counseling for genetic testing.  Patient would like to defer until later.   She knows to notify me if she changes her mind..  Goals of care, counseling/discussion Curative intent.  Discussed with  patient.   No orders of the defined types were placed in this encounter.  Follow up TBD, depending on patient's decision.   All questions were answered. The patient knows to call the clinic with any problems, questions or concerns.  Earlie Server, MD, PhD [facility] Hematology Oncology 06/15/2022   CHIEF COMPLAINTS/PURPOSE OF CONSULTATION:  Right breast cancer  HISTORY OF PRESENTING ILLNESS:  Kristina [name] 71 y.o. female presents to establish care for right breast cancer  Menarche at age of [age] No child  OCP use: <5 years History of hysterectomy:  Menopausal status: postmenopausal History of HRT use: 7-8 years History of chest radiation: no Number of previous breast biopsies:  no  Family history + bladder cancer, breast cancer I have reviewed her chart and materials related to her cancer extensively and collaborated history with the patient. Summary of oncologic history is as follows: Oncology History  Breast cancer in female [facility])  04/16/2022 Mammogram   Bilateral screening mammogram In the right breast, a possible mass warrants further evaluation. In the left breast, no findings suspicious for malignancy    04/28/2022 Imaging   Unilateral right breast diagnostic mammogram There is persistence of a 1.7 cm spiculated mass in the medial aspect of the breast. There are no malignant type microcalcifications. Targeted ultrasound is performed, showing an irregular hypoechoic mass in the right breast at 3 o'clock 3 cm from the nipple measuring 1.7 x 0.9 x 0.8 cm.  Sonographic evaluation of the right axilla does not show any enlarged adenopathy.   05/07/2022 Initial Diagnosis   Breast cancer in female [facility])  04/30/22 s/p right breast biopsy Pathology showed invasive moderately differentiated ductal adenocarcinoma Grade  2.  ER 100% +, PR + 90%, HER2 equivocal IHC 2+, FISH negative.     05/07/2022 Cancer Staging   Staging form: Breast, AJCC 8th Edition - Clinical stage from  05/07/2022: Stage IA (cT1c, cN0, cM0, G2, ER+, PR+, HER2-) - Signed by Earlie Server, MD on 05/07/2022 Stage prefix: Initial diagnosis Histologic grading system: 3 grade system   05/24/2022 Surgery   S/p right lumpectomy and right SLNB   Pathology showed  Invasive mammary carcinoma NOS, Grade 2, DICS present, no LVI, all margins are negative for invasive carcinoma and DCIS. pT1b pN0    05/24/2022 Oncotype testing   34, recurrence rate at 9 years with TAM 22%, absolute chemotherapy benefit >15%.    05/24/2022 Cancer Staging   Staging form: Breast, AJCC 8th Edition - Pathologic stage from 05/24/2022: Stage IA (pT1b, pN0, cM0, G2, ER+, PR+, HER2-, Oncotype DX score: 34) - Signed by Earlie Server, MD on 06/15/2022 Multigene prognostic tests performed: Oncotype DX Recurrence score range: Greater than or equal to 11 Histologic grading system: 3 grade system    Patient has had lumpectomy and sentinel lymph node biopsy.  She tolerated the procedure well.  Today she has no new complaints with her surgical sites.  She presented to discuss pathology reports and adjuvant plan.  MEDICAL HISTORY:  Past Medical History:  Diagnosis Date   Actinic keratosis    Anxiety    Arthritis    Back pain    Breast cancer ([place])    Cancer ([place])    basal and squamous cell skin   Chicken pox    Chronic back pain    Complication of anesthesia    hard to wake up after lap cho-had to stay overnight-bp and hr dropped   COPD (chronic obstructive pulmonary disease) ([place])    Depression    Dyspnea    due to copd   Gallbladder problem    GERD (gastroesophageal reflux disease)    Goiter    Hypertension    Hypothyroidism    Joint pain    Lactose intolerance    Migraines    Osteoarthritis    PONV (postoperative nausea and vomiting)    very nauseated   SOB (shortness of breath)    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, Mohs   Thyroid disease    Thyroid nodule    Tremor    Vitamin D deficiency     SURGICAL  HISTORY: Past Surgical History:  Procedure Laterality Date   ABDOMINAL HYSTERECTOMY  1978   BREAST LUMPECTOMY,RADIO FREQ LOCALIZER,AXILLARY SENTINEL LYMPH NODE BIOPSY Right 05/24/2022   Procedure: BREAST LUMPECTOMY,RADIO FREQ LOCALIZER,AXILLARY SENTINEL LYMPH NODE BIOPSY;  Surgeon: Ronny Bacon, MD;  Location: [facility] ORS;  Service: General;  Laterality: Right;   CATARACT EXTRACTION Left 12/20/2017   will have the right one completed a month later    CATARACT EXTRACTION Right    CHOLECYSTECTOMY  2000   COLONOSCOPY  2007   JOINT REPLACEMENT Right 08/19/2017   great toe    TOE SURGERY     UPPER GASTROINTESTINAL ENDOSCOPY  2007,2014    SOCIAL HISTORY: Social History   Socioeconomic History   Marital status: Single    Spouse name: Not on file   Number of children: 0   Years of education: 12   Highest education level: Some college, no degree  Occupational History   Occupation: retired    Comment: administration  Tobacco Use   Smoking status: Former    Packs/day: 1.50  Years: 25.00    Total pack years: 37.50    Types: Cigarettes    Quit date: 03/03/1990    Years since quitting: 32.3   Smokeless tobacco: Never  Vaping Use   Vaping Use: Never used  Substance and Sexual Activity   Alcohol use: Not Currently   Drug use: No   Sexual activity: Never  Other Topics Concern   Not on file  Social History Narrative   Lives alone   Caffeine- tea, 1 cup   Social Determinants of Health   Financial Resource Strain: Low Risk  (03/29/2022)   Overall Financial Resource Strain (CARDIA)    Difficulty of Paying Living Expenses: Not very hard  Food Insecurity: No Food Insecurity (03/29/2022)   Hunger Vital Sign    Worried About Running Out of Food in the Last Year: Never true    Ran Out of Food in the Last Year: Never true  Transportation Needs: No Transportation Needs (03/29/2022)   PRAPARE - Hydrologist (Medical): No    Lack of Transportation  (Non-Medical): No  Physical Activity: Sufficiently Active (03/29/2022)   Exercise Vital Sign    Days of Exercise per Week: 3 days    Minutes of Exercise per Session: 60 min  Stress: Stress Concern Present (03/29/2022)   [place]    Feeling of Stress : Very much  Social Connections: Socially Isolated (03/29/2022)   Social Connection and Isolation Panel [NHANES]    Frequency of Communication with Friends and Family: More than three times a week    Frequency of Social Gatherings with Friends and Family: Once a week    Attends Religious Services: Never    Marine scientist or Organizations: No    Attends Music therapist: Not on file    Marital Status: Divorced  Human resources officer Violence: Not on file    FAMILY HISTORY: Family History  Problem Relation Age of Onset   Arthritis Mother    Diabetes Mother    COPD Mother    Heart failure Mother    Stroke Father    Hypertension Father    Hyperlipidemia Father    Diabetes Father    CAD Father    Heart disease Father    Heart failure Father    Breast cancer Maternal Aunt    Cancer Sister        bladder cancer    Throat cancer Brother    Colon cancer Neg Hx    Stomach cancer Neg Hx    Esophageal cancer Neg Hx     ALLERGIES:  is allergic to sulfa antibiotics, hydrocodone, and meloxicam.  MEDICATIONS:  Current Outpatient Medications  Medication Sig Dispense Refill   Acetaminophen (TYLENOL PO) Take 500 mg by mouth 2 (two) times daily.     albuterol (PROVENTIL HFA;VENTOLIN HFA) 108 (90 Base) MCG/ACT inhaler Inhale 2 puffs into the lungs every 6 (six) hours as needed for wheezing or shortness of breath. (Patient taking differently: Inhale 2 puffs into the lungs every morning. And PRN Q 6 hrs) 1 Inhaler 2   ALPRAZolam (XANAX) 0.25 MG tablet Take 1 tablet by mouth twice daily as needed for anxiety 45 tablet 2   atorvastatin (LIPITOR) 10 MG tablet Take 1  tablet (10 mg total) by mouth daily. (Patient taking differently: Take 10 mg by mouth every evening.) 90 tablet 3   azelaic acid (AZELEX) 20 % cream Apply topically 2 (two) times  daily. After skin is thoroughly washed and patted dry, gently but thoroughly massage a thin film of azelaic acid cream into the affected area twice daily, in the morning and evening.     Calcium-Vitamin D-Vitamin K 500-100-40 MG-UNT-MCG CHEW Chew 1 tablet by mouth daily. chewable     Cholecalciferol (VITAMIN D3) 125 MCG (5000 UT) CAPS Take 1 capsule by mouth daily.     cyanocobalamin (VITAMIN B12) 500 MCG tablet Take 1,000 mcg by mouth daily.     cyclobenzaprine (FLEXERIL) 5 MG tablet Take 1 tablet (5 mg total) by mouth daily as needed for muscle spasms. (Patient taking differently: Take 5 mg by mouth at bedtime.) 90 tablet 1   levothyroxine (SYNTHROID) 25 MCG tablet TAKE 1 TABLET BY MOUTH ONCE DAILY BEFORE BREAKFAST 90 tablet 2   Magnesium 250 MG TABS Take 1 tablet by mouth daily.     meclizine (ANTIVERT) 12.5 MG tablet Take 1 tablet (12.5 mg total) by mouth 3 (three) times daily as needed for dizziness. 60 tablet 1   Multiple Vitamin (MULTIVITAMIN) tablet Take 1 tablet by mouth daily.     pantoprazole (PROTONIX) 40 MG tablet Take 1 tablet (40 mg total) by mouth daily. (Patient taking differently: Take 40 mg by mouth every evening.) 90 tablet 2   PARoxetine (PAXIL) 10 MG tablet Take 1.5 tablets (15 mg total) by mouth daily. (Patient taking differently: Take 10 mg by mouth every morning.) 90 tablet 1   propranolol (INDERAL) 40 MG tablet Take 1 tablet (40 mg total) by mouth 2 (two) times daily. (Patient taking differently: Take 40 mg by mouth 2 (two) times daily.) 180 tablet 2   RESTASIS 0.05 % ophthalmic emulsion Place 1 drop into both eyes 2 (two) times daily.     VOLTAREN 1 % GEL 2 g as needed.     No current facility-administered medications for this visit.    Review of Systems  Constitutional:  Negative for appetite  change, chills, fatigue and fever.  HENT:   Negative for hearing loss and voice change.   Eyes:  Negative for eye problems.  Respiratory:  Negative for chest tightness and cough.   Cardiovascular:  Negative for chest pain.  Gastrointestinal:  Negative for abdominal distention, abdominal pain and blood in stool.  Endocrine: Negative for hot flashes.  Genitourinary:  Negative for difficulty urinating and frequency.   Musculoskeletal:  Negative for arthralgias.  Skin:  Negative for itching and rash.  Neurological:  Negative for extremity weakness.  Hematological:  Negative for adenopathy.  Psychiatric/Behavioral:  Negative for confusion.      PHYSICAL EXAMINATION: ECOG PERFORMANCE STATUS: 0 - Asymptomatic  Vitals:   06/15/22 0958  BP: 124/78  Pulse: 81  Temp: (!) 96.6 F (35.9 C)  SpO2: 96%   Filed Weights   06/15/22 0958  Weight: 187 lb (84.8 kg)    Physical Exam Constitutional:      General: She is not in acute distress. HENT:     Head: Normocephalic and atraumatic.  Eyes:     General: No scleral icterus. Cardiovascular:     Rate and Rhythm: Normal rate.  Pulmonary:     Effort: Pulmonary effort is normal. No respiratory distress.  Abdominal:     General: There is no distension.  Musculoskeletal:        General: Normal range of motion.     Cervical back: Normal range of motion and neck supple.  Skin:    General: Skin is warm and dry.  Neurological:  Mental Status: She is alert and oriented to person, place, and time.     Cranial Nerves: No cranial nerve deficit.     Motor: No abnormal muscle tone.     Coordination: Coordination normal.  Psychiatric:        Mood and Affect: Mood and affect normal.       LABORATORY DATA:  I have reviewed the data as listed    Latest Ref Rng & Units 04/07/2022    3:10 PM 03/31/2022    2:01 PM 01/18/2018    2:00 PM  CBC  WBC 4.0 - 10.5 K/uL 8.6  8.2  11.6   Hemoglobin 12.0 - 15.0 g/dL 13.2  13.1  14.6   Hematocrit 36.0  - 46.0 % 40.2  38.9  42.8   Platelets 150 - 400 K/uL 259  238.0  308       Latest Ref Rng & Units 03/31/2022    2:01 PM 02/16/2021   10:33 AM 08/19/2020   10:34 AM  CMP  Glucose 70 - 99 mg/dL 100  96  108   BUN 6 - 23 mg/dL _0 Creatinine 0.40 - 1.20 mg/dL 0.91  0.96  0.88   Sodium 135 - 145 mEq/L 136  136  134   Potassium 3.5 - 5.1 mEq/L 3.9  4.4  4.6   Chloride 96 - 112 mEq/L 100  102  105   CO2 19 - 32 mEq/L _1 Calcium 8.4 - 10.5 mg/dL 9.0  9.1  9.0   Total Protein 6.0 - 8.3 g/dL 6.6  6.4    Total Bilirubin 0.2 - 1.2 mg/dL 0.5  0.6    Alkaline Phos 39 - 117 U/L 67  67    AST 0 - 37 U/L 21  22    ALT 0 - 35 U/L 23  27       RADIOGRAPHIC STUDIES: I have personally reviewed the radiological images as listed and agreed with the findings in the report. MM Breast Surgical Specimen  Result Date: 05/24/2022 CLINICAL DATA:  Status post Savi Scout localized RIGHT breast lumpectomy. EXAM: SPECIMEN RADIOGRAPH OF THE RIGHT BREAST COMPARISON:  Previous exam(s). FINDINGS: Status post excision of the RIGHT breast. The [facility] reflector and ribbon shaped clip are present within the specimen. IMPRESSION: Specimen radiograph of the RIGHT breast. Electronically Signed   By: Franki Cabot M.D.   On: 05/24/2022 14:27  NM Sentinel Node Inj-No Rpt (Breast)  Result Date: 05/24/2022 Sulfur Colloid was injected by the Nuclear Medicine Technologist for sentinel lymph node localization.

## 2022-06-15 NOTE — Progress Notes (Signed)
START ON PATHWAY REGIMEN - Breast     A cycle is every 21 days:     Docetaxel      Cyclophosphamide   **Always confirm dose/schedule in your pharmacy ordering system**  Patient Characteristics: Postoperative without Neoadjuvant Therapy (Pathologic Staging), Invasive Disease, Adjuvant Therapy, HER2 Negative, ER Positive, Node Negative, pT1a, pN82m or pT1b-c, pN0/N123mor pT2 or Higher, pN0, Oncotype High Risk (? 26) Therapeutic Status: Postoperative without Neoadjuvant Therapy (Pathologic Staging) AJCC Grade: G2 AJCC N Category: pN0 AJCC M Category: cM0 ER Status: Positive (+) AJCC 8 Stage Grouping: IA HER2 Status: Negative (-) Oncotype Dx Recurrence Score: 34 AJCC T Category: pT1b PR Status: Positive (+) Has this patient completed genomic testing<= Yes - Oncotype DX(R) Intent of Therapy: Curative Intent, Discussed with Patient

## 2022-06-15 NOTE — Assessment & Plan Note (Signed)
Recommend genetic counseling for genetic testing.  Patient would like to defer until later.   She knows to notify me if she changes her mind.. 

## 2022-06-15 NOTE — Progress Notes (Signed)
Met with Kristina [name] during follow up appt. With Dr. Tasia Catchings.  She is going to think about adjuvant chemo and discuss with her family.  She will call back when she decides.

## 2022-06-15 NOTE — Progress Notes (Signed)
Patient is here for follow-up of Oncotype results. Patient had surgery on Oct. 16th.

## 2022-06-15 NOTE — Assessment & Plan Note (Signed)
Curative intent. Discussed with patient.  

## 2022-06-15 NOTE — Assessment & Plan Note (Addendum)
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  Rationale was discussed with patient. Chemotherapy education was provided.  We had discussed the composition of chemotherapy regimen, length of chemo cycle, duration of treatment and the time to assess response to treatment.  I explained to the patient the risks and benefits of chemotherapy including all but not limited to hair loss, mouth sore, nausea, vomiting, diarrhea, low blood counts, bleeding, neuropathy and risk of life threatening infection and even death, secondary malignancy etc.  . Patient is undecided today and prefers to think about and update me. If she decides to to proceed with chemotherapy Chemotherapy education; refer to surgery for Medi- port placement. Antiemetics-Zofran and Compazine; EMLA cream will be sent to pharmacy  Discussed with patient about future plan of adjuvant radiation and adjuvant endocrine therapy.

## 2022-06-16 ENCOUNTER — Encounter: Payer: Self-pay | Admitting: *Deleted

## 2022-06-16 DIAGNOSIS — C50211 Malignant neoplasm of upper-inner quadrant of right female breast: Secondary | ICD-10-CM

## 2022-06-16 NOTE — Progress Notes (Signed)
Spoke to Kristina [name] today, she had several questions that were answered to her satisfaction.  She has decided not to pursue adjuvant chemotherapy and would just like to have radiation plus AI.   Referral has been sent to radiation oncology.

## 2022-06-17 ENCOUNTER — Other Ambulatory Visit: Payer: Self-pay

## 2022-06-22 ENCOUNTER — Encounter: Payer: Self-pay | Admitting: Radiation Oncology

## 2022-06-22 ENCOUNTER — Encounter: Payer: Self-pay | Admitting: *Deleted

## 2022-06-22 ENCOUNTER — Ambulatory Visit
Admission: RE | Admit: 2022-06-22 | Discharge: 2022-06-22 | Disposition: A | Discharge status: Home / Self Care | Payer: PPO | Source: Ambulatory Visit | Attending: Radiation Oncology | Admitting: Radiation Oncology

## 2022-06-22 VITALS — BP 126/77 | HR 70 | Temp 97.2°F | Resp 16 | Ht 61.75 in | Wt 188.0 lb

## 2022-06-22 DIAGNOSIS — C50211 Malignant neoplasm of upper-inner quadrant of right female breast: Secondary | ICD-10-CM | POA: Insufficient documentation

## 2022-06-22 DIAGNOSIS — Z800 Family history of malignant neoplasm of digestive organs: Secondary | ICD-10-CM | POA: Diagnosis not present

## 2022-06-22 DIAGNOSIS — Z87891 Personal history of nicotine dependence: Secondary | ICD-10-CM | POA: Insufficient documentation

## 2022-06-22 DIAGNOSIS — Z803 Family history of malignant neoplasm of breast: Secondary | ICD-10-CM | POA: Insufficient documentation

## 2022-06-22 DIAGNOSIS — Z170 Estrogen receptor positive status [ER+]: Secondary | ICD-10-CM | POA: Diagnosis not present

## 2022-06-22 DIAGNOSIS — Z85828 Personal history of other malignant neoplasm of skin: Secondary | ICD-10-CM | POA: Insufficient documentation

## 2022-06-22 NOTE — Progress Notes (Signed)
Met with Kristina [name] at her radiation consultation

## 2022-06-22 NOTE — Consult Note (Signed)
NEW PATIENT EVALUATION  Name: Kristina [name]  MRN: [id number]  Date:   06/22/2022     DOB: 06-26-1951   This 71 y.o. 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  REFERRING PHYSICIAN: [place], Betty G, MD  CHIEF COMPLAINT:  Chief Complaint  Patient presents with   Breast Cancer    DIAGNOSIS: The encounter diagnosis was Primary malignant neoplasm of upper inner quadrant of female breast, right ([place]).   PREVIOUS INVESTIGATIONS:  Mammogram ultrasound reviewed Clinical notes reviewed Pathology report reviewed  HPI: Patient is a 71 year old female who presents with an abnormal mammogram of her right breast.  There was a suspicious 1.7 cm mass at 3 o'clock position of the right breast 3 cm from the nipple.  She underwent under ultrasound-guided core biopsy showing invasive mammary carcinoma.  Sonographic evaluation of the right axilla did not show any enlarged adenopathy.  She underwent a wide local excision showing a 7.5 mm overall grade 2 invasive mammary carcinoma.  Margins were clear at 0.6 mm for the invasive component 0.1 mm for the DCIS component in the posterior margin.  1 sentinel lymph node was negative for metastatic disease.  Tumor again was ER/PR positive HER2/neu not overexpressed.  She had an Oncotype DX showing a recurrence risk of 34 with the benefit of chemotherapy greater than 15% although patient has declined systemic chemotherapy.  She is seen today for radiation oncology evaluation.  She is doing well some mild tenderness in her right axilla which is expected.  She specifically denies breast tenderness cough or bone pain.  PLANNED TREATMENT REGIMEN: Right whole breast radiation with boost  PAST MEDICAL HISTORY:  has a past medical history of Actinic keratosis, Anxiety, Arthritis, Back pain, Breast  cancer ([place]), Cancer ([place]), Chicken pox, Chronic back pain, Complication of anesthesia, COPD (chronic obstructive pulmonary disease) ([place]), Depression, Dyspnea, Gallbladder problem, GERD (gastroesophageal reflux disease), Goiter, Hypertension, Hypothyroidism, Joint pain, Lactose intolerance, Migraines, Osteoarthritis, PONV (postoperative nausea and vomiting), SOB (shortness of breath), Squamous cell carcinoma of skin (2009), Thyroid disease, Thyroid nodule, Tremor, and Vitamin D deficiency.    PAST SURGICAL HISTORY:  Past Surgical History:  Procedure Laterality Date   ABDOMINAL HYSTERECTOMY  1978   BREAST LUMPECTOMY,RADIO FREQ LOCALIZER,AXILLARY SENTINEL LYMPH NODE BIOPSY Right 05/24/2022   Procedure: BREAST LUMPECTOMY,RADIO FREQ LOCALIZER,AXILLARY SENTINEL LYMPH NODE BIOPSY;  Surgeon: Ronny Bacon, MD;  Location: [facility] ORS;  Service: General;  Laterality: Right;   CATARACT EXTRACTION Left 12/20/2017   will have the right one completed a month later    CATARACT EXTRACTION Right    CHOLECYSTECTOMY  2000   COLONOSCOPY  2007   JOINT REPLACEMENT Right 08/19/2017   great toe    TOE SURGERY     UPPER GASTROINTESTINAL ENDOSCOPY  [id number]    FAMILY HISTORY: family history includes Arthritis in her mother; Breast cancer in her maternal aunt; CAD in her father; COPD in her mother; Cancer in her sister; Diabetes in her father and mother; Heart disease in her father; Heart failure in her father and mother; Hyperlipidemia in her father; Hypertension in her father; Stroke in her father; Throat cancer in her brother.  SOCIAL HISTORY:  reports that she quit smoking about 32 years ago. Her smoking use included cigarettes. She has a 37.50 pack-year smoking history. She has never used smokeless tobacco. She reports that she  does not currently use alcohol. She reports that she does not use drugs.  ALLERGIES: Sulfa antibiotics, Hydrocodone, and Meloxicam  MEDICATIONS:  Current Outpatient Medications   Medication Sig Dispense Refill   Acetaminophen (TYLENOL PO) Take 500 mg by mouth 2 (two) times daily.     albuterol (PROVENTIL HFA;VENTOLIN HFA) 108 (90 Base) MCG/ACT inhaler Inhale 2 puffs into the lungs every 6 (six) hours as needed for wheezing or shortness of breath. (Patient taking differently: Inhale 2 puffs into the lungs every morning. And PRN Q 6 hrs) 1 Inhaler 2   ALPRAZolam (XANAX) 0.25 MG tablet Take 1 tablet by mouth twice daily as needed for anxiety 45 tablet 2   atorvastatin (LIPITOR) 10 MG tablet Take 1 tablet (10 mg total) by mouth daily. (Patient taking differently: Take 10 mg by mouth every evening.) 90 tablet 3   azelaic acid (AZELEX) 20 % cream Apply topically 2 (two) times daily. After skin is thoroughly washed and patted dry, gently but thoroughly massage a thin film of azelaic acid cream into the affected area twice daily, in the morning and evening.     Calcium-Vitamin D-Vitamin K 500-100-40 MG-UNT-MCG CHEW Chew 1 tablet by mouth daily. chewable     Cholecalciferol (VITAMIN D3) 125 MCG (5000 UT) CAPS Take 1 capsule by mouth daily.     cyanocobalamin (VITAMIN B12) 500 MCG tablet Take 1,000 mcg by mouth daily.     cyclobenzaprine (FLEXERIL) 5 MG tablet Take 1 tablet (5 mg total) by mouth daily as needed for muscle spasms. (Patient taking differently: Take 5 mg by mouth at bedtime.) 90 tablet 1   levothyroxine (SYNTHROID) 25 MCG tablet TAKE 1 TABLET BY MOUTH ONCE DAILY BEFORE BREAKFAST 90 tablet 2   loratadine (CLARITIN) 10 MG tablet Take 10 mg by mouth daily.     Magnesium 250 MG TABS Take 1 tablet by mouth daily.     meclizine (ANTIVERT) 12.5 MG tablet Take 1 tablet (12.5 mg total) by mouth 3 (three) times daily as needed for dizziness. 60 tablet 1   Multiple Vitamin (MULTIVITAMIN) tablet Take 1 tablet by mouth daily.     pantoprazole (PROTONIX) 40 MG tablet Take 1 tablet (40 mg total) by mouth daily. (Patient taking differently: Take 40 mg by mouth every evening.) 90 tablet  2   PARoxetine (PAXIL) 10 MG tablet Take 1.5 tablets (15 mg total) by mouth daily. (Patient taking differently: Take 10 mg by mouth every morning.) 90 tablet 1   propranolol (INDERAL) 40 MG tablet Take 1 tablet (40 mg total) by mouth 2 (two) times daily. (Patient taking differently: Take 40 mg by mouth 2 (two) times daily.) 180 tablet 2   RESTASIS 0.05 % ophthalmic emulsion Place 1 drop into both eyes 2 (two) times daily.     VOLTAREN 1 % GEL 2 g as needed.     No current facility-administered medications for this encounter.    ECOG PERFORMANCE STATUS:  0 - Asymptomatic  REVIEW OF SYSTEMS: Patient denies any weight loss, fatigue, weakness, fever, chills or night sweats. Patient denies any loss of vision, blurred vision. Patient denies any ringing  of the ears or hearing loss. No irregular heartbeat. Patient denies heart murmur or history of fainting. Patient denies any chest pain or pain radiating to her upper extremities. Patient denies any shortness of breath, difficulty breathing at night, cough or hemoptysis. Patient denies any swelling in the lower legs. Patient denies any nausea vomiting, vomiting of blood, or coffee ground material in  the vomitus. Patient denies any stomach pain. Patient states has had normal bowel movements no significant constipation or diarrhea. Patient denies any dysuria, hematuria or significant nocturia. Patient denies any problems walking, swelling in the joints or loss of balance. Patient denies any skin changes, loss of hair or loss of weight. Patient denies any excessive worrying or anxiety or significant depression. Patient denies any problems with insomnia. Patient denies excessive thirst, polyuria, polydipsia. Patient denies any swollen glands, patient denies easy bruising or easy bleeding. Patient denies any recent infections, allergies or URI. Patient "s visual fields have not changed significantly in recent time.   PHYSICAL EXAM: BP 126/77 (BP Location: Left  Arm, Patient Position: Sitting, Cuff Size: Normal)   Pulse 70   Temp (!) 97.2 F (36.2 C) (Tympanic)   Resp 16   Ht 5' 1.75" (1.568 m)   Wt 188 lb (85.3 kg)   LMP  (LMP Unknown)   BMI 34.66 kg/m  She status post wide local excision of the right breast incision is healed well.  Her sentinel node biopsy is also well-healed no dominant masses noted in either breast.  No axillary or supraclavicular adenopathy is appreciated.  Well-developed well-nourished patient in NAD. HEENT reveals PERLA, EOMI, discs not visualized.  Oral cavity is clear. No oral mucosal lesions are identified. Neck is clear without evidence of cervical or supraclavicular adenopathy. Lungs are clear to A&P. Cardiac examination is essentially unremarkable with regular rate and rhythm without murmur rub or thrill. Abdomen is benign with no organomegaly or masses noted. Motor sensory and DTR levels are equal and symmetric in the upper and lower extremities. Cranial nerves II through XII are grossly intact. Proprioception is intact. No peripheral adenopathy or edema is identified. No motor or sensory levels are noted. Crude visual fields are within normal range.  LABORATORY DATA: Pathology reports reviewed    RADIOLOGY RESULTS: Mammogram and ultrasound reviewed compatible with above-stated findings   IMPRESSION: Stage Ia invasive mammary carcinoma the right breast status post wide local excision with close margins for DCIS in 71 year old female who has declined systemic chemotherapy  PLAN: This time patient has a large volvulus breast which would make hypofractionated course of treatment difficult.  I will plan delivering 5040 cGy in 28 fractions to her right breast boosting her scar another 1600 centigrade using electron beam or photon beam depending on the geometry of her lumpectomy site.  Risks and benefits of treatment including skin reaction fatigue alteration of blood counts possible inclusion of superficial lung all were  reviewed in detail with the patient.  She seems to comprehend my treatment plan well.  I have set her up for CT simulation.  Patient will benefit from a endocrine therapy after completion of radiation.  I would like to take this opportunity to thank you for allowing me to participate in the care of your patient.Noreene Filbert, MD

## 2022-06-28 ENCOUNTER — Other Ambulatory Visit: Payer: Self-pay | Admitting: Family Medicine

## 2022-06-28 DIAGNOSIS — K219 Gastro-esophageal reflux disease without esophagitis: Secondary | ICD-10-CM

## 2022-06-30 DIAGNOSIS — C50211 Malignant neoplasm of upper-inner quadrant of right female breast: Secondary | ICD-10-CM | POA: Diagnosis not present

## 2022-06-30 DIAGNOSIS — Z170 Estrogen receptor positive status [ER+]: Secondary | ICD-10-CM | POA: Diagnosis not present

## 2022-07-06 ENCOUNTER — Ambulatory Visit
Admission: RE | Admit: 2022-07-06 | Discharge: 2022-07-06 | Disposition: A | Discharge status: Home / Self Care | Payer: PPO | Source: Ambulatory Visit | Attending: Radiation Oncology | Admitting: Radiation Oncology

## 2022-07-06 ENCOUNTER — Encounter: Payer: Self-pay | Admitting: *Deleted

## 2022-07-06 DIAGNOSIS — C50211 Malignant neoplasm of upper-inner quadrant of right female breast: Secondary | ICD-10-CM | POA: Diagnosis not present

## 2022-07-06 DIAGNOSIS — Z170 Estrogen receptor positive status [ER+]: Secondary | ICD-10-CM | POA: Diagnosis not present

## 2022-07-06 NOTE — Progress Notes (Signed)
Briefly met with Kristina [name] at her CT sim appt.

## 2022-07-09 ENCOUNTER — Other Ambulatory Visit: Payer: Self-pay | Admitting: *Deleted

## 2022-07-09 DIAGNOSIS — C50211 Malignant neoplasm of upper-inner quadrant of right female breast: Secondary | ICD-10-CM

## 2022-07-12 DIAGNOSIS — Z170 Estrogen receptor positive status [ER+]: Secondary | ICD-10-CM | POA: Insufficient documentation

## 2022-07-12 DIAGNOSIS — C50211 Malignant neoplasm of upper-inner quadrant of right female breast: Secondary | ICD-10-CM | POA: Insufficient documentation

## 2022-07-12 DIAGNOSIS — Z803 Family history of malignant neoplasm of breast: Secondary | ICD-10-CM | POA: Insufficient documentation

## 2022-07-12 DIAGNOSIS — Z800 Family history of malignant neoplasm of digestive organs: Secondary | ICD-10-CM | POA: Insufficient documentation

## 2022-07-12 DIAGNOSIS — Z87891 Personal history of nicotine dependence: Secondary | ICD-10-CM | POA: Insufficient documentation

## 2022-07-12 DIAGNOSIS — C50911 Malignant neoplasm of unspecified site of right female breast: Secondary | ICD-10-CM | POA: Insufficient documentation

## 2022-07-12 DIAGNOSIS — Z85828 Personal history of other malignant neoplasm of skin: Secondary | ICD-10-CM | POA: Insufficient documentation

## 2022-07-14 ENCOUNTER — Ambulatory Visit
Admission: RE | Admit: 2022-07-14 | Discharge: 2022-07-14 | Disposition: A | Discharge status: Home / Self Care | Payer: PPO | Source: Ambulatory Visit | Attending: Radiation Oncology | Admitting: Radiation Oncology

## 2022-07-14 DIAGNOSIS — C50911 Malignant neoplasm of unspecified site of right female breast: Secondary | ICD-10-CM | POA: Diagnosis not present

## 2022-07-14 DIAGNOSIS — Z510 Encounter for antineoplastic radiation therapy: Secondary | ICD-10-CM | POA: Diagnosis not present

## 2022-07-14 DIAGNOSIS — C50211 Malignant neoplasm of upper-inner quadrant of right female breast: Secondary | ICD-10-CM | POA: Diagnosis not present

## 2022-07-14 DIAGNOSIS — Z170 Estrogen receptor positive status [ER+]: Secondary | ICD-10-CM | POA: Diagnosis not present

## 2022-07-15 ENCOUNTER — Ambulatory Visit
Admission: RE | Admit: 2022-07-15 | Discharge: 2022-07-15 | Disposition: A | Discharge status: Home / Self Care | Payer: PPO | Source: Ambulatory Visit | Attending: Radiation Oncology | Admitting: Radiation Oncology

## 2022-07-15 ENCOUNTER — Other Ambulatory Visit: Payer: Self-pay

## 2022-07-15 DIAGNOSIS — C50911 Malignant neoplasm of unspecified site of right female breast: Secondary | ICD-10-CM | POA: Diagnosis not present

## 2022-07-15 DIAGNOSIS — Z170 Estrogen receptor positive status [ER+]: Secondary | ICD-10-CM | POA: Diagnosis not present

## 2022-07-15 DIAGNOSIS — C50211 Malignant neoplasm of upper-inner quadrant of right female breast: Secondary | ICD-10-CM | POA: Diagnosis not present

## 2022-07-15 DIAGNOSIS — Z510 Encounter for antineoplastic radiation therapy: Secondary | ICD-10-CM | POA: Diagnosis not present

## 2022-07-15 LAB — RAD ONC ARIA SESSION SUMMARY
Course Elapsed Days: 0
Plan Fractions Treated to Date: 1
Plan Prescribed Dose Per Fraction: 1.8 Gy
Plan Total Fractions Prescribed: 28
Plan Total Prescribed Dose: 50.4 Gy
Reference Point Dosage Given to Date: 1.8 Gy
Reference Point Session Dosage Given: 1.8 Gy
Session Number: 1

## 2022-07-16 ENCOUNTER — Ambulatory Visit
Admission: RE | Admit: 2022-07-16 | Discharge: 2022-07-16 | Disposition: A | Discharge status: Home / Self Care | Payer: PPO | Source: Ambulatory Visit | Attending: Radiation Oncology | Admitting: Radiation Oncology

## 2022-07-16 ENCOUNTER — Other Ambulatory Visit: Payer: Self-pay

## 2022-07-16 DIAGNOSIS — Z510 Encounter for antineoplastic radiation therapy: Secondary | ICD-10-CM | POA: Diagnosis not present

## 2022-07-16 DIAGNOSIS — Z170 Estrogen receptor positive status [ER+]: Secondary | ICD-10-CM | POA: Diagnosis not present

## 2022-07-16 DIAGNOSIS — C50211 Malignant neoplasm of upper-inner quadrant of right female breast: Secondary | ICD-10-CM | POA: Diagnosis not present

## 2022-07-16 DIAGNOSIS — C50911 Malignant neoplasm of unspecified site of right female breast: Secondary | ICD-10-CM | POA: Diagnosis not present

## 2022-07-16 LAB — RAD ONC ARIA SESSION SUMMARY
Course Elapsed Days: 1
Plan Fractions Treated to Date: 2
Plan Prescribed Dose Per Fraction: 1.8 Gy
Plan Total Fractions Prescribed: 28
Plan Total Prescribed Dose: 50.4 Gy
Reference Point Dosage Given to Date: 3.6 Gy
Reference Point Session Dosage Given: 1.8 Gy
Session Number: 2

## 2022-07-19 ENCOUNTER — Ambulatory Visit
Admission: RE | Admit: 2022-07-19 | Discharge: 2022-07-19 | Disposition: A | Discharge status: Home / Self Care | Payer: PPO | Source: Ambulatory Visit | Attending: Radiation Oncology | Admitting: Radiation Oncology

## 2022-07-19 ENCOUNTER — Other Ambulatory Visit: Payer: Self-pay

## 2022-07-19 DIAGNOSIS — Z510 Encounter for antineoplastic radiation therapy: Secondary | ICD-10-CM | POA: Diagnosis not present

## 2022-07-19 DIAGNOSIS — Z170 Estrogen receptor positive status [ER+]: Secondary | ICD-10-CM | POA: Diagnosis not present

## 2022-07-19 DIAGNOSIS — C50211 Malignant neoplasm of upper-inner quadrant of right female breast: Secondary | ICD-10-CM | POA: Diagnosis not present

## 2022-07-19 DIAGNOSIS — C50911 Malignant neoplasm of unspecified site of right female breast: Secondary | ICD-10-CM | POA: Diagnosis not present

## 2022-07-19 LAB — RAD ONC ARIA SESSION SUMMARY
Course Elapsed Days: 4
Plan Fractions Treated to Date: 3
Plan Prescribed Dose Per Fraction: 1.8 Gy
Plan Total Fractions Prescribed: 28
Plan Total Prescribed Dose: 50.4 Gy
Reference Point Dosage Given to Date: 5.4 Gy
Reference Point Session Dosage Given: 1.8 Gy
Session Number: 3

## 2022-07-20 ENCOUNTER — Other Ambulatory Visit: Payer: Self-pay

## 2022-07-20 ENCOUNTER — Ambulatory Visit
Admission: RE | Admit: 2022-07-20 | Discharge: 2022-07-20 | Disposition: A | Discharge status: Home / Self Care | Payer: PPO | Source: Ambulatory Visit | Attending: Radiation Oncology | Admitting: Radiation Oncology

## 2022-07-20 DIAGNOSIS — Z510 Encounter for antineoplastic radiation therapy: Secondary | ICD-10-CM | POA: Diagnosis not present

## 2022-07-20 DIAGNOSIS — Z170 Estrogen receptor positive status [ER+]: Secondary | ICD-10-CM | POA: Diagnosis not present

## 2022-07-20 DIAGNOSIS — C50211 Malignant neoplasm of upper-inner quadrant of right female breast: Secondary | ICD-10-CM | POA: Diagnosis not present

## 2022-07-20 DIAGNOSIS — C50911 Malignant neoplasm of unspecified site of right female breast: Secondary | ICD-10-CM | POA: Diagnosis not present

## 2022-07-20 LAB — RAD ONC ARIA SESSION SUMMARY
Course Elapsed Days: 5
Plan Fractions Treated to Date: 4
Plan Prescribed Dose Per Fraction: 1.8 Gy
Plan Total Fractions Prescribed: 28
Plan Total Prescribed Dose: 50.4 Gy
Reference Point Dosage Given to Date: 7.2 Gy
Reference Point Session Dosage Given: 1.8 Gy
Session Number: 4

## 2022-07-21 ENCOUNTER — Other Ambulatory Visit: Payer: Self-pay

## 2022-07-21 ENCOUNTER — Ambulatory Visit
Admission: RE | Admit: 2022-07-21 | Discharge: 2022-07-21 | Disposition: A | Discharge status: Home / Self Care | Payer: PPO | Source: Ambulatory Visit | Attending: Radiation Oncology | Admitting: Radiation Oncology

## 2022-07-21 DIAGNOSIS — Z170 Estrogen receptor positive status [ER+]: Secondary | ICD-10-CM | POA: Diagnosis not present

## 2022-07-21 DIAGNOSIS — C50211 Malignant neoplasm of upper-inner quadrant of right female breast: Secondary | ICD-10-CM | POA: Diagnosis not present

## 2022-07-21 DIAGNOSIS — C50911 Malignant neoplasm of unspecified site of right female breast: Secondary | ICD-10-CM | POA: Diagnosis not present

## 2022-07-21 DIAGNOSIS — Z510 Encounter for antineoplastic radiation therapy: Secondary | ICD-10-CM | POA: Diagnosis not present

## 2022-07-21 LAB — RAD ONC ARIA SESSION SUMMARY
Course Elapsed Days: 6
Plan Fractions Treated to Date: 5
Plan Prescribed Dose Per Fraction: 1.8 Gy
Plan Total Fractions Prescribed: 28
Plan Total Prescribed Dose: 50.4 Gy
Reference Point Dosage Given to Date: 9 Gy
Reference Point Session Dosage Given: 1.8 Gy
Session Number: 5

## 2022-07-22 ENCOUNTER — Other Ambulatory Visit: Payer: Self-pay

## 2022-07-22 ENCOUNTER — Ambulatory Visit
Admission: RE | Admit: 2022-07-22 | Discharge: 2022-07-22 | Disposition: A | Discharge status: Home / Self Care | Payer: PPO | Source: Ambulatory Visit | Attending: Radiation Oncology | Admitting: Radiation Oncology

## 2022-07-22 DIAGNOSIS — C50211 Malignant neoplasm of upper-inner quadrant of right female breast: Secondary | ICD-10-CM | POA: Diagnosis not present

## 2022-07-22 DIAGNOSIS — C50911 Malignant neoplasm of unspecified site of right female breast: Secondary | ICD-10-CM | POA: Diagnosis not present

## 2022-07-22 DIAGNOSIS — Z510 Encounter for antineoplastic radiation therapy: Secondary | ICD-10-CM | POA: Diagnosis not present

## 2022-07-22 DIAGNOSIS — Z170 Estrogen receptor positive status [ER+]: Secondary | ICD-10-CM | POA: Diagnosis not present

## 2022-07-22 LAB — RAD ONC ARIA SESSION SUMMARY
Course Elapsed Days: 7
Plan Fractions Treated to Date: 6
Plan Prescribed Dose Per Fraction: 1.8 Gy
Plan Total Fractions Prescribed: 28
Plan Total Prescribed Dose: 50.4 Gy
Reference Point Dosage Given to Date: 10.8 Gy
Reference Point Session Dosage Given: 1.8 Gy
Session Number: 6

## 2022-07-23 ENCOUNTER — Ambulatory Visit
Admission: RE | Admit: 2022-07-23 | Discharge: 2022-07-23 | Disposition: A | Discharge status: Home / Self Care | Payer: PPO | Source: Ambulatory Visit | Attending: Radiation Oncology | Admitting: Radiation Oncology

## 2022-07-23 ENCOUNTER — Other Ambulatory Visit: Payer: Self-pay

## 2022-07-23 DIAGNOSIS — Z170 Estrogen receptor positive status [ER+]: Secondary | ICD-10-CM | POA: Diagnosis not present

## 2022-07-23 DIAGNOSIS — C50911 Malignant neoplasm of unspecified site of right female breast: Secondary | ICD-10-CM | POA: Diagnosis not present

## 2022-07-23 DIAGNOSIS — Z510 Encounter for antineoplastic radiation therapy: Secondary | ICD-10-CM | POA: Diagnosis not present

## 2022-07-23 DIAGNOSIS — C50211 Malignant neoplasm of upper-inner quadrant of right female breast: Secondary | ICD-10-CM | POA: Diagnosis not present

## 2022-07-23 LAB — RAD ONC ARIA SESSION SUMMARY
Course Elapsed Days: 8
Plan Fractions Treated to Date: 7
Plan Prescribed Dose Per Fraction: 1.8 Gy
Plan Total Fractions Prescribed: 28
Plan Total Prescribed Dose: 50.4 Gy
Reference Point Dosage Given to Date: 12.6 Gy
Reference Point Session Dosage Given: 1.8 Gy
Session Number: 7

## 2022-07-26 ENCOUNTER — Inpatient Hospital Stay: Payer: PPO | Attending: Oncology

## 2022-07-26 ENCOUNTER — Other Ambulatory Visit: Payer: Self-pay

## 2022-07-26 ENCOUNTER — Ambulatory Visit
Admission: RE | Admit: 2022-07-26 | Discharge: 2022-07-26 | Disposition: A | Discharge status: Home / Self Care | Payer: PPO | Source: Ambulatory Visit | Attending: Radiation Oncology | Admitting: Radiation Oncology

## 2022-07-26 DIAGNOSIS — Z170 Estrogen receptor positive status [ER+]: Secondary | ICD-10-CM | POA: Insufficient documentation

## 2022-07-26 DIAGNOSIS — C50911 Malignant neoplasm of unspecified site of right female breast: Secondary | ICD-10-CM | POA: Insufficient documentation

## 2022-07-26 DIAGNOSIS — Z510 Encounter for antineoplastic radiation therapy: Secondary | ICD-10-CM | POA: Diagnosis not present

## 2022-07-26 DIAGNOSIS — C50211 Malignant neoplasm of upper-inner quadrant of right female breast: Secondary | ICD-10-CM

## 2022-07-26 LAB — CBC
HCT: 38.7 % (ref 36.0–46.0)
Hemoglobin: 13.3 g/dL (ref 12.0–15.0)
MCH: 31.1 pg (ref 26.0–34.0)
MCHC: 34.4 g/dL (ref 30.0–36.0)
MCV: 90.4 fL (ref 80.0–100.0)
Platelets: 259 10*3/uL (ref 150–400)
RBC: 4.28 MIL/uL (ref 3.87–5.11)
RDW: 13 % (ref 11.5–15.5)
WBC: 7.5 10*3/uL (ref 4.0–10.5)
nRBC: 0 % (ref 0.0–0.2)

## 2022-07-26 LAB — RAD ONC ARIA SESSION SUMMARY
Course Elapsed Days: 11
Plan Fractions Treated to Date: 8
Plan Prescribed Dose Per Fraction: 1.8 Gy
Plan Total Fractions Prescribed: 28
Plan Total Prescribed Dose: 50.4 Gy
Reference Point Dosage Given to Date: 14.4 Gy
Reference Point Session Dosage Given: 1.8 Gy
Session Number: 8

## 2022-07-27 ENCOUNTER — Other Ambulatory Visit: Payer: Self-pay

## 2022-07-27 ENCOUNTER — Ambulatory Visit
Admission: RE | Admit: 2022-07-27 | Discharge: 2022-07-27 | Disposition: A | Discharge status: Home / Self Care | Payer: PPO | Source: Ambulatory Visit | Attending: Radiation Oncology | Admitting: Radiation Oncology

## 2022-07-27 DIAGNOSIS — Z170 Estrogen receptor positive status [ER+]: Secondary | ICD-10-CM | POA: Diagnosis not present

## 2022-07-27 DIAGNOSIS — C50911 Malignant neoplasm of unspecified site of right female breast: Secondary | ICD-10-CM | POA: Diagnosis not present

## 2022-07-27 DIAGNOSIS — C50211 Malignant neoplasm of upper-inner quadrant of right female breast: Secondary | ICD-10-CM | POA: Diagnosis not present

## 2022-07-27 DIAGNOSIS — Z510 Encounter for antineoplastic radiation therapy: Secondary | ICD-10-CM | POA: Diagnosis not present

## 2022-07-27 LAB — RAD ONC ARIA SESSION SUMMARY
Course Elapsed Days: 12
Plan Fractions Treated to Date: 9
Plan Prescribed Dose Per Fraction: 1.8 Gy
Plan Total Fractions Prescribed: 28
Plan Total Prescribed Dose: 50.4 Gy
Reference Point Dosage Given to Date: 16.2 Gy
Reference Point Session Dosage Given: 1.8 Gy
Session Number: 9

## 2022-07-28 ENCOUNTER — Ambulatory Visit
Admission: RE | Admit: 2022-07-28 | Discharge: 2022-07-28 | Disposition: A | Discharge status: Home / Self Care | Payer: PPO | Source: Ambulatory Visit | Attending: Radiation Oncology | Admitting: Radiation Oncology

## 2022-07-28 ENCOUNTER — Other Ambulatory Visit: Payer: Self-pay

## 2022-07-28 DIAGNOSIS — C50211 Malignant neoplasm of upper-inner quadrant of right female breast: Secondary | ICD-10-CM | POA: Diagnosis not present

## 2022-07-28 DIAGNOSIS — Z510 Encounter for antineoplastic radiation therapy: Secondary | ICD-10-CM | POA: Diagnosis not present

## 2022-07-28 DIAGNOSIS — C50911 Malignant neoplasm of unspecified site of right female breast: Secondary | ICD-10-CM | POA: Diagnosis not present

## 2022-07-28 DIAGNOSIS — Z170 Estrogen receptor positive status [ER+]: Secondary | ICD-10-CM | POA: Diagnosis not present

## 2022-07-28 LAB — RAD ONC ARIA SESSION SUMMARY
Course Elapsed Days: 13
Plan Fractions Treated to Date: 10
Plan Prescribed Dose Per Fraction: 1.8 Gy
Plan Total Fractions Prescribed: 28
Plan Total Prescribed Dose: 50.4 Gy
Reference Point Dosage Given to Date: 18 Gy
Reference Point Session Dosage Given: 1.8 Gy
Session Number: 10

## 2022-07-29 ENCOUNTER — Other Ambulatory Visit: Payer: Self-pay

## 2022-07-29 ENCOUNTER — Ambulatory Visit
Admission: RE | Admit: 2022-07-29 | Discharge: 2022-07-29 | Disposition: A | Discharge status: Home / Self Care | Payer: PPO | Source: Ambulatory Visit | Attending: Radiation Oncology | Admitting: Radiation Oncology

## 2022-07-29 DIAGNOSIS — C50911 Malignant neoplasm of unspecified site of right female breast: Secondary | ICD-10-CM | POA: Diagnosis not present

## 2022-07-29 DIAGNOSIS — Z170 Estrogen receptor positive status [ER+]: Secondary | ICD-10-CM | POA: Diagnosis not present

## 2022-07-29 DIAGNOSIS — C50211 Malignant neoplasm of upper-inner quadrant of right female breast: Secondary | ICD-10-CM | POA: Diagnosis not present

## 2022-07-29 DIAGNOSIS — Z510 Encounter for antineoplastic radiation therapy: Secondary | ICD-10-CM | POA: Diagnosis not present

## 2022-07-29 LAB — RAD ONC ARIA SESSION SUMMARY
Course Elapsed Days: 14
Plan Fractions Treated to Date: 11
Plan Prescribed Dose Per Fraction: 1.8 Gy
Plan Total Fractions Prescribed: 28
Plan Total Prescribed Dose: 50.4 Gy
Reference Point Dosage Given to Date: 19.8 Gy
Reference Point Session Dosage Given: 1.8 Gy
Session Number: 11

## 2022-07-30 ENCOUNTER — Other Ambulatory Visit: Payer: Self-pay

## 2022-07-30 ENCOUNTER — Ambulatory Visit
Admission: RE | Admit: 2022-07-30 | Discharge: 2022-07-30 | Disposition: A | Discharge status: Home / Self Care | Payer: PPO | Source: Ambulatory Visit | Attending: Radiation Oncology | Admitting: Radiation Oncology

## 2022-07-30 DIAGNOSIS — C50911 Malignant neoplasm of unspecified site of right female breast: Secondary | ICD-10-CM | POA: Diagnosis not present

## 2022-07-30 DIAGNOSIS — C50211 Malignant neoplasm of upper-inner quadrant of right female breast: Secondary | ICD-10-CM | POA: Diagnosis not present

## 2022-07-30 DIAGNOSIS — Z170 Estrogen receptor positive status [ER+]: Secondary | ICD-10-CM | POA: Diagnosis not present

## 2022-07-30 DIAGNOSIS — Z510 Encounter for antineoplastic radiation therapy: Secondary | ICD-10-CM | POA: Diagnosis not present

## 2022-07-30 LAB — RAD ONC ARIA SESSION SUMMARY
Course Elapsed Days: 15
Plan Fractions Treated to Date: 12
Plan Prescribed Dose Per Fraction: 1.8 Gy
Plan Total Fractions Prescribed: 28
Plan Total Prescribed Dose: 50.4 Gy
Reference Point Dosage Given to Date: 21.6 Gy
Reference Point Session Dosage Given: 1.8 Gy
Session Number: 12

## 2022-08-03 ENCOUNTER — Ambulatory Visit
Admission: RE | Admit: 2022-08-03 | Discharge: 2022-08-03 | Disposition: A | Discharge status: Home / Self Care | Payer: PPO | Source: Ambulatory Visit | Attending: Radiation Oncology | Admitting: Radiation Oncology

## 2022-08-03 ENCOUNTER — Other Ambulatory Visit: Payer: Self-pay

## 2022-08-03 DIAGNOSIS — Z510 Encounter for antineoplastic radiation therapy: Secondary | ICD-10-CM | POA: Diagnosis not present

## 2022-08-03 DIAGNOSIS — Z170 Estrogen receptor positive status [ER+]: Secondary | ICD-10-CM | POA: Diagnosis not present

## 2022-08-03 DIAGNOSIS — C50211 Malignant neoplasm of upper-inner quadrant of right female breast: Secondary | ICD-10-CM | POA: Diagnosis not present

## 2022-08-03 DIAGNOSIS — C50911 Malignant neoplasm of unspecified site of right female breast: Secondary | ICD-10-CM | POA: Diagnosis not present

## 2022-08-03 LAB — RAD ONC ARIA SESSION SUMMARY
Course Elapsed Days: 19
Plan Fractions Treated to Date: 13
Plan Prescribed Dose Per Fraction: 1.8 Gy
Plan Total Fractions Prescribed: 28
Plan Total Prescribed Dose: 50.4 Gy
Reference Point Dosage Given to Date: 23.4 Gy
Reference Point Session Dosage Given: 1.8 Gy
Session Number: 13

## 2022-08-04 ENCOUNTER — Ambulatory Visit
Admission: RE | Admit: 2022-08-04 | Discharge: 2022-08-04 | Disposition: A | Discharge status: Home / Self Care | Payer: PPO | Source: Ambulatory Visit | Attending: Radiation Oncology | Admitting: Radiation Oncology

## 2022-08-04 ENCOUNTER — Other Ambulatory Visit: Payer: Self-pay

## 2022-08-04 DIAGNOSIS — C50911 Malignant neoplasm of unspecified site of right female breast: Secondary | ICD-10-CM | POA: Diagnosis not present

## 2022-08-04 DIAGNOSIS — Z510 Encounter for antineoplastic radiation therapy: Secondary | ICD-10-CM | POA: Diagnosis not present

## 2022-08-04 DIAGNOSIS — C50211 Malignant neoplasm of upper-inner quadrant of right female breast: Secondary | ICD-10-CM | POA: Diagnosis not present

## 2022-08-04 DIAGNOSIS — Z170 Estrogen receptor positive status [ER+]: Secondary | ICD-10-CM | POA: Diagnosis not present

## 2022-08-04 LAB — RAD ONC ARIA SESSION SUMMARY
Course Elapsed Days: 20
Plan Fractions Treated to Date: 14
Plan Prescribed Dose Per Fraction: 1.8 Gy
Plan Total Fractions Prescribed: 28
Plan Total Prescribed Dose: 50.4 Gy
Reference Point Dosage Given to Date: 25.2 Gy
Reference Point Session Dosage Given: 1.8 Gy
Session Number: 14

## 2022-08-05 ENCOUNTER — Other Ambulatory Visit: Payer: Self-pay

## 2022-08-05 ENCOUNTER — Ambulatory Visit
Admission: RE | Admit: 2022-08-05 | Discharge: 2022-08-05 | Disposition: A | Discharge status: Home / Self Care | Payer: PPO | Source: Ambulatory Visit | Attending: Radiation Oncology | Admitting: Radiation Oncology

## 2022-08-05 DIAGNOSIS — Z510 Encounter for antineoplastic radiation therapy: Secondary | ICD-10-CM | POA: Diagnosis not present

## 2022-08-05 DIAGNOSIS — C50911 Malignant neoplasm of unspecified site of right female breast: Secondary | ICD-10-CM | POA: Diagnosis not present

## 2022-08-05 DIAGNOSIS — Z170 Estrogen receptor positive status [ER+]: Secondary | ICD-10-CM | POA: Diagnosis not present

## 2022-08-05 DIAGNOSIS — C50211 Malignant neoplasm of upper-inner quadrant of right female breast: Secondary | ICD-10-CM | POA: Diagnosis not present

## 2022-08-05 LAB — RAD ONC ARIA SESSION SUMMARY
Course Elapsed Days: 21
Plan Fractions Treated to Date: 15
Plan Prescribed Dose Per Fraction: 1.8 Gy
Plan Total Fractions Prescribed: 28
Plan Total Prescribed Dose: 50.4 Gy
Reference Point Dosage Given to Date: 27 Gy
Reference Point Session Dosage Given: 1.8 Gy
Session Number: 15

## 2022-08-06 ENCOUNTER — Other Ambulatory Visit: Payer: Self-pay

## 2022-08-06 ENCOUNTER — Ambulatory Visit
Admission: RE | Admit: 2022-08-06 | Discharge: 2022-08-06 | Disposition: A | Discharge status: Home / Self Care | Payer: PPO | Source: Ambulatory Visit | Attending: Radiation Oncology | Admitting: Radiation Oncology

## 2022-08-06 DIAGNOSIS — Z170 Estrogen receptor positive status [ER+]: Secondary | ICD-10-CM | POA: Diagnosis not present

## 2022-08-06 DIAGNOSIS — C50211 Malignant neoplasm of upper-inner quadrant of right female breast: Secondary | ICD-10-CM | POA: Diagnosis not present

## 2022-08-06 DIAGNOSIS — C50911 Malignant neoplasm of unspecified site of right female breast: Secondary | ICD-10-CM | POA: Diagnosis not present

## 2022-08-06 DIAGNOSIS — Z510 Encounter for antineoplastic radiation therapy: Secondary | ICD-10-CM | POA: Diagnosis not present

## 2022-08-06 LAB — RAD ONC ARIA SESSION SUMMARY
Course Elapsed Days: 22
Plan Fractions Treated to Date: 16
Plan Prescribed Dose Per Fraction: 1.8 Gy
Plan Total Fractions Prescribed: 28
Plan Total Prescribed Dose: 50.4 Gy
Reference Point Dosage Given to Date: 28.8 Gy
Reference Point Session Dosage Given: 1.8 Gy
Session Number: 16

## 2022-08-10 ENCOUNTER — Other Ambulatory Visit: Payer: Self-pay

## 2022-08-10 ENCOUNTER — Inpatient Hospital Stay: Payer: PPO | Attending: Oncology

## 2022-08-10 ENCOUNTER — Ambulatory Visit
Admission: RE | Admit: 2022-08-10 | Discharge: 2022-08-10 | Disposition: A | Discharge status: Home / Self Care | Payer: PPO | Source: Ambulatory Visit | Attending: Radiation Oncology | Admitting: Radiation Oncology

## 2022-08-10 DIAGNOSIS — C50211 Malignant neoplasm of upper-inner quadrant of right female breast: Secondary | ICD-10-CM | POA: Insufficient documentation

## 2022-08-10 DIAGNOSIS — Z800 Family history of malignant neoplasm of digestive organs: Secondary | ICD-10-CM | POA: Insufficient documentation

## 2022-08-10 DIAGNOSIS — Z85828 Personal history of other malignant neoplasm of skin: Secondary | ICD-10-CM | POA: Insufficient documentation

## 2022-08-10 DIAGNOSIS — Z87891 Personal history of nicotine dependence: Secondary | ICD-10-CM | POA: Insufficient documentation

## 2022-08-10 DIAGNOSIS — Z170 Estrogen receptor positive status [ER+]: Secondary | ICD-10-CM | POA: Insufficient documentation

## 2022-08-10 DIAGNOSIS — Z803 Family history of malignant neoplasm of breast: Secondary | ICD-10-CM | POA: Insufficient documentation

## 2022-08-10 DIAGNOSIS — Z510 Encounter for antineoplastic radiation therapy: Secondary | ICD-10-CM | POA: Diagnosis not present

## 2022-08-10 LAB — CBC
HCT: 37.2 % (ref 36.0–46.0)
Hemoglobin: 12.7 g/dL (ref 12.0–15.0)
MCH: 30.8 pg (ref 26.0–34.0)
MCHC: 34.1 g/dL (ref 30.0–36.0)
MCV: 90.1 fL (ref 80.0–100.0)
Platelets: 230 10*3/uL (ref 150–400)
RBC: 4.13 MIL/uL (ref 3.87–5.11)
RDW: 13.1 % (ref 11.5–15.5)
WBC: 7.9 10*3/uL (ref 4.0–10.5)
nRBC: 0 % (ref 0.0–0.2)

## 2022-08-10 LAB — RAD ONC ARIA SESSION SUMMARY
Course Elapsed Days: 26
Plan Fractions Treated to Date: 17
Plan Prescribed Dose Per Fraction: 1.8 Gy
Plan Total Fractions Prescribed: 28
Plan Total Prescribed Dose: 50.4 Gy
Reference Point Dosage Given to Date: 30.6 Gy
Reference Point Session Dosage Given: 1.8 Gy
Session Number: 17

## 2022-08-11 ENCOUNTER — Ambulatory Visit
Admission: RE | Admit: 2022-08-11 | Discharge: 2022-08-11 | Disposition: A | Discharge status: Home / Self Care | Payer: PPO | Source: Ambulatory Visit | Attending: Radiation Oncology | Admitting: Radiation Oncology

## 2022-08-11 ENCOUNTER — Other Ambulatory Visit: Payer: Self-pay

## 2022-08-11 DIAGNOSIS — Z170 Estrogen receptor positive status [ER+]: Secondary | ICD-10-CM | POA: Diagnosis not present

## 2022-08-11 DIAGNOSIS — Z510 Encounter for antineoplastic radiation therapy: Secondary | ICD-10-CM | POA: Diagnosis not present

## 2022-08-11 DIAGNOSIS — C50211 Malignant neoplasm of upper-inner quadrant of right female breast: Secondary | ICD-10-CM | POA: Diagnosis not present

## 2022-08-11 LAB — RAD ONC ARIA SESSION SUMMARY
Course Elapsed Days: 27
Plan Fractions Treated to Date: 18
Plan Prescribed Dose Per Fraction: 1.8 Gy
Plan Total Fractions Prescribed: 28
Plan Total Prescribed Dose: 50.4 Gy
Reference Point Dosage Given to Date: 32.4 Gy
Reference Point Session Dosage Given: 1.8 Gy
Session Number: 18

## 2022-08-12 ENCOUNTER — Other Ambulatory Visit: Payer: Self-pay

## 2022-08-12 ENCOUNTER — Ambulatory Visit
Admission: RE | Admit: 2022-08-12 | Discharge: 2022-08-12 | Disposition: A | Discharge status: Home / Self Care | Payer: PPO | Source: Ambulatory Visit | Attending: Radiation Oncology | Admitting: Radiation Oncology

## 2022-08-12 DIAGNOSIS — C50211 Malignant neoplasm of upper-inner quadrant of right female breast: Secondary | ICD-10-CM | POA: Diagnosis not present

## 2022-08-12 DIAGNOSIS — Z510 Encounter for antineoplastic radiation therapy: Secondary | ICD-10-CM | POA: Diagnosis not present

## 2022-08-12 DIAGNOSIS — Z170 Estrogen receptor positive status [ER+]: Secondary | ICD-10-CM | POA: Diagnosis not present

## 2022-08-12 LAB — RAD ONC ARIA SESSION SUMMARY
Course Elapsed Days: 28
Plan Fractions Treated to Date: 19
Plan Prescribed Dose Per Fraction: 1.8 Gy
Plan Total Fractions Prescribed: 28
Plan Total Prescribed Dose: 50.4 Gy
Reference Point Dosage Given to Date: 34.2 Gy
Reference Point Session Dosage Given: 1.8 Gy
Session Number: 19

## 2022-08-13 ENCOUNTER — Telehealth: Payer: Self-pay | Admitting: Family Medicine

## 2022-08-13 ENCOUNTER — Other Ambulatory Visit: Payer: Self-pay

## 2022-08-13 ENCOUNTER — Ambulatory Visit
Admission: RE | Admit: 2022-08-13 | Discharge: 2022-08-13 | Disposition: A | Discharge status: Home / Self Care | Payer: PPO | Source: Ambulatory Visit | Attending: Radiation Oncology | Admitting: Radiation Oncology

## 2022-08-13 DIAGNOSIS — C50211 Malignant neoplasm of upper-inner quadrant of right female breast: Secondary | ICD-10-CM | POA: Diagnosis not present

## 2022-08-13 DIAGNOSIS — R0600 Dyspnea, unspecified: Secondary | ICD-10-CM

## 2022-08-13 DIAGNOSIS — Z170 Estrogen receptor positive status [ER+]: Secondary | ICD-10-CM | POA: Diagnosis not present

## 2022-08-13 DIAGNOSIS — Z510 Encounter for antineoplastic radiation therapy: Secondary | ICD-10-CM | POA: Diagnosis not present

## 2022-08-13 LAB — RAD ONC ARIA SESSION SUMMARY
Course Elapsed Days: 29
Plan Fractions Treated to Date: 20
Plan Prescribed Dose Per Fraction: 1.8 Gy
Plan Total Fractions Prescribed: 28
Plan Total Prescribed Dose: 50.4 Gy
Reference Point Dosage Given to Date: 36 Gy
Reference Point Session Dosage Given: 1.8 Gy
Session Number: 20

## 2022-08-13 MED ORDER — ALBUTEROL SULFATE HFA 108 (90 BASE) MCG/ACT IN AERS: 2.0000 | INHALATION_SPRAY | Freq: Four times a day (QID) | RESPIRATORY_TRACT | 2 refills | Status: DC | PRN

## 2022-08-13 NOTE — Telephone Encounter (Signed)
Rx sent in

## 2022-08-13 NOTE — Telephone Encounter (Signed)
Patient called because she needs a new prescription for her albuterol (PROVENTIL HFA;VENTOLIN HFA) 108 (90 Base) MCG/ACT inhaler       Please send to' [facility]) - [place], [place] Phone: [phone]  Fax: [phone]        I did make patient aware that she may need an appointment of some kind before getting prescription.      Please advise

## 2022-08-16 ENCOUNTER — Other Ambulatory Visit: Payer: Self-pay

## 2022-08-16 ENCOUNTER — Ambulatory Visit
Admission: RE | Admit: 2022-08-16 | Discharge: 2022-08-16 | Disposition: A | Discharge status: Home / Self Care | Payer: PPO | Source: Ambulatory Visit | Attending: Radiation Oncology | Admitting: Radiation Oncology

## 2022-08-16 DIAGNOSIS — Z510 Encounter for antineoplastic radiation therapy: Secondary | ICD-10-CM | POA: Diagnosis not present

## 2022-08-16 DIAGNOSIS — C50211 Malignant neoplasm of upper-inner quadrant of right female breast: Secondary | ICD-10-CM | POA: Diagnosis not present

## 2022-08-16 DIAGNOSIS — Z170 Estrogen receptor positive status [ER+]: Secondary | ICD-10-CM | POA: Diagnosis not present

## 2022-08-16 LAB — RAD ONC ARIA SESSION SUMMARY
Course Elapsed Days: 32
Plan Fractions Treated to Date: 21
Plan Prescribed Dose Per Fraction: 1.8 Gy
Plan Total Fractions Prescribed: 28
Plan Total Prescribed Dose: 50.4 Gy
Reference Point Dosage Given to Date: 37.8 Gy
Reference Point Session Dosage Given: 1.8 Gy
Session Number: 21

## 2022-08-17 ENCOUNTER — Telehealth: Payer: Self-pay | Admitting: Family Medicine

## 2022-08-17 ENCOUNTER — Ambulatory Visit
Admission: RE | Admit: 2022-08-17 | Discharge: 2022-08-17 | Disposition: A | Discharge status: Home / Self Care | Payer: PPO | Source: Ambulatory Visit | Attending: Radiation Oncology | Admitting: Radiation Oncology

## 2022-08-17 ENCOUNTER — Other Ambulatory Visit: Payer: Self-pay

## 2022-08-17 DIAGNOSIS — Z170 Estrogen receptor positive status [ER+]: Secondary | ICD-10-CM | POA: Diagnosis not present

## 2022-08-17 DIAGNOSIS — Z510 Encounter for antineoplastic radiation therapy: Secondary | ICD-10-CM | POA: Diagnosis not present

## 2022-08-17 DIAGNOSIS — C50211 Malignant neoplasm of upper-inner quadrant of right female breast: Secondary | ICD-10-CM | POA: Diagnosis not present

## 2022-08-17 LAB — RAD ONC ARIA SESSION SUMMARY
Course Elapsed Days: 33
Plan Fractions Treated to Date: 22
Plan Prescribed Dose Per Fraction: 1.8 Gy
Plan Total Fractions Prescribed: 28
Plan Total Prescribed Dose: 50.4 Gy
Reference Point Dosage Given to Date: 39.6 Gy
Reference Point Session Dosage Given: 1.8 Gy
Session Number: 22

## 2022-08-17 MED ORDER — ALBUTEROL SULFATE HFA 108 (90 BASE) MCG/ACT IN AERS: 2.0000 | INHALATION_SPRAY | Freq: Four times a day (QID) | RESPIRATORY_TRACT | 3 refills | Status: DC | PRN

## 2022-08-17 NOTE — Telephone Encounter (Signed)
[place] with [employer] is calling and ventolin is not covered on pt insurance however proair is covered and kim is requesting qty for 90 day supply and . Maudie Mercury said a verbal change is ok no need to send new rx  . The reference number is [id number]

## 2022-08-17 NOTE — Telephone Encounter (Signed)
Rx changed & sent.

## 2022-08-18 ENCOUNTER — Ambulatory Visit: Payer: PPO

## 2022-08-19 ENCOUNTER — Ambulatory Visit
Admission: RE | Admit: 2022-08-19 | Discharge: 2022-08-19 | Disposition: A | Discharge status: Home / Self Care | Payer: PPO | Source: Ambulatory Visit | Attending: Radiation Oncology | Admitting: Radiation Oncology

## 2022-08-19 ENCOUNTER — Other Ambulatory Visit: Payer: Self-pay

## 2022-08-19 DIAGNOSIS — Z510 Encounter for antineoplastic radiation therapy: Secondary | ICD-10-CM | POA: Diagnosis not present

## 2022-08-19 DIAGNOSIS — Z170 Estrogen receptor positive status [ER+]: Secondary | ICD-10-CM | POA: Diagnosis not present

## 2022-08-19 DIAGNOSIS — C50211 Malignant neoplasm of upper-inner quadrant of right female breast: Secondary | ICD-10-CM | POA: Diagnosis not present

## 2022-08-19 LAB — RAD ONC ARIA SESSION SUMMARY
Course Elapsed Days: 35
Plan Fractions Treated to Date: 23
Plan Prescribed Dose Per Fraction: 1.8 Gy
Plan Total Fractions Prescribed: 28
Plan Total Prescribed Dose: 50.4 Gy
Reference Point Dosage Given to Date: 41.4 Gy
Reference Point Session Dosage Given: 1.8 Gy
Session Number: 23

## 2022-08-20 ENCOUNTER — Ambulatory Visit: Payer: PPO

## 2022-08-23 ENCOUNTER — Other Ambulatory Visit: Payer: Self-pay

## 2022-08-23 ENCOUNTER — Inpatient Hospital Stay: Payer: PPO | Admitting: Licensed Clinical Social Worker

## 2022-08-23 ENCOUNTER — Inpatient Hospital Stay: Payer: PPO

## 2022-08-23 ENCOUNTER — Ambulatory Visit
Admission: RE | Admit: 2022-08-23 | Discharge: 2022-08-23 | Disposition: A | Discharge status: Home / Self Care | Payer: PPO | Source: Ambulatory Visit | Attending: Radiation Oncology | Admitting: Radiation Oncology

## 2022-08-23 DIAGNOSIS — Z510 Encounter for antineoplastic radiation therapy: Secondary | ICD-10-CM | POA: Diagnosis not present

## 2022-08-23 DIAGNOSIS — Z170 Estrogen receptor positive status [ER+]: Secondary | ICD-10-CM

## 2022-08-23 DIAGNOSIS — C50211 Malignant neoplasm of upper-inner quadrant of right female breast: Secondary | ICD-10-CM | POA: Diagnosis not present

## 2022-08-23 LAB — CBC
HCT: 37.9 % (ref 36.0–46.0)
Hemoglobin: 12.8 g/dL (ref 12.0–15.0)
MCH: 30.8 pg (ref 26.0–34.0)
MCHC: 33.8 g/dL (ref 30.0–36.0)
MCV: 91.3 fL (ref 80.0–100.0)
Platelets: 236 10*3/uL (ref 150–400)
RBC: 4.15 MIL/uL (ref 3.87–5.11)
RDW: 13.2 % (ref 11.5–15.5)
WBC: 7.6 10*3/uL (ref 4.0–10.5)
nRBC: 0 % (ref 0.0–0.2)

## 2022-08-23 LAB — RAD ONC ARIA SESSION SUMMARY
Course Elapsed Days: 39
Plan Fractions Treated to Date: 24
Plan Prescribed Dose Per Fraction: 1.8 Gy
Plan Total Fractions Prescribed: 28
Plan Total Prescribed Dose: 50.4 Gy
Reference Point Dosage Given to Date: 43.2 Gy
Reference Point Session Dosage Given: 1.8 Gy
Session Number: 24

## 2022-08-23 NOTE — Progress Notes (Signed)
[facility] CSW Progress Note  Clinical Armed forces logistics/support/administrative officer for [employer] received a voice mail from pt requesting to sign up for the Advance Directive Clinic on 1/18.  CSW returned call to inform the clinic has been cancelled this week.  CSW offered to schedule a time to meet pt individually to complete Adv Dir or offered for pt to sign up for the next clinic offered when CSW [place] returns.  Pt states she would prefer to go to the scheduled clinic upon CSW Teresita's return.  Message left for CSW Teresita to contact pt regarding the above upon return to the office.      Henriette Combs, LCSW

## 2022-08-24 ENCOUNTER — Other Ambulatory Visit: Payer: Self-pay

## 2022-08-24 ENCOUNTER — Ambulatory Visit
Admission: RE | Admit: 2022-08-24 | Discharge: 2022-08-24 | Disposition: A | Discharge status: Home / Self Care | Payer: PPO | Source: Ambulatory Visit | Attending: Radiation Oncology | Admitting: Radiation Oncology

## 2022-08-24 DIAGNOSIS — C50211 Malignant neoplasm of upper-inner quadrant of right female breast: Secondary | ICD-10-CM | POA: Diagnosis not present

## 2022-08-24 DIAGNOSIS — Z170 Estrogen receptor positive status [ER+]: Secondary | ICD-10-CM | POA: Diagnosis not present

## 2022-08-24 DIAGNOSIS — Z510 Encounter for antineoplastic radiation therapy: Secondary | ICD-10-CM | POA: Diagnosis not present

## 2022-08-24 LAB — RAD ONC ARIA SESSION SUMMARY
Course Elapsed Days: 40
Plan Fractions Treated to Date: 25
Plan Prescribed Dose Per Fraction: 1.8 Gy
Plan Total Fractions Prescribed: 28
Plan Total Prescribed Dose: 50.4 Gy
Reference Point Dosage Given to Date: 45 Gy
Reference Point Session Dosage Given: 1.8 Gy
Session Number: 25

## 2022-08-25 ENCOUNTER — Ambulatory Visit: Payer: PPO

## 2022-08-25 ENCOUNTER — Ambulatory Visit
Admission: RE | Admit: 2022-08-25 | Discharge: 2022-08-25 | Disposition: A | Discharge status: Home / Self Care | Payer: PPO | Source: Ambulatory Visit | Attending: Radiation Oncology | Admitting: Radiation Oncology

## 2022-08-25 ENCOUNTER — Other Ambulatory Visit: Payer: Self-pay

## 2022-08-25 DIAGNOSIS — Z510 Encounter for antineoplastic radiation therapy: Secondary | ICD-10-CM | POA: Diagnosis not present

## 2022-08-25 DIAGNOSIS — C50211 Malignant neoplasm of upper-inner quadrant of right female breast: Secondary | ICD-10-CM | POA: Diagnosis not present

## 2022-08-25 DIAGNOSIS — Z170 Estrogen receptor positive status [ER+]: Secondary | ICD-10-CM | POA: Diagnosis not present

## 2022-08-25 LAB — RAD ONC ARIA SESSION SUMMARY
Course Elapsed Days: 41
Plan Fractions Treated to Date: 26
Plan Prescribed Dose Per Fraction: 1.8 Gy
Plan Total Fractions Prescribed: 28
Plan Total Prescribed Dose: 50.4 Gy
Reference Point Dosage Given to Date: 46.8 Gy
Reference Point Session Dosage Given: 1.8 Gy
Session Number: 26

## 2022-08-26 ENCOUNTER — Other Ambulatory Visit: Payer: Self-pay

## 2022-08-26 ENCOUNTER — Ambulatory Visit
Admission: RE | Admit: 2022-08-26 | Discharge: 2022-08-26 | Disposition: A | Discharge status: Home / Self Care | Payer: PPO | Source: Ambulatory Visit | Attending: Radiation Oncology | Admitting: Radiation Oncology

## 2022-08-26 ENCOUNTER — Ambulatory Visit: Payer: PPO

## 2022-08-26 DIAGNOSIS — C50211 Malignant neoplasm of upper-inner quadrant of right female breast: Secondary | ICD-10-CM | POA: Diagnosis not present

## 2022-08-26 DIAGNOSIS — Z170 Estrogen receptor positive status [ER+]: Secondary | ICD-10-CM | POA: Diagnosis not present

## 2022-08-26 DIAGNOSIS — Z510 Encounter for antineoplastic radiation therapy: Secondary | ICD-10-CM | POA: Diagnosis not present

## 2022-08-26 LAB — RAD ONC ARIA SESSION SUMMARY
Course Elapsed Days: 42
Plan Fractions Treated to Date: 27
Plan Prescribed Dose Per Fraction: 1.8 Gy
Plan Total Fractions Prescribed: 28
Plan Total Prescribed Dose: 50.4 Gy
Reference Point Dosage Given to Date: 48.6 Gy
Reference Point Session Dosage Given: 1.8 Gy
Session Number: 27

## 2022-08-27 ENCOUNTER — Other Ambulatory Visit: Payer: Self-pay

## 2022-08-27 ENCOUNTER — Ambulatory Visit: Payer: PPO

## 2022-08-27 ENCOUNTER — Ambulatory Visit
Admission: RE | Admit: 2022-08-27 | Discharge: 2022-08-27 | Disposition: A | Discharge status: Home / Self Care | Payer: PPO | Source: Ambulatory Visit | Attending: Radiation Oncology | Admitting: Radiation Oncology

## 2022-08-27 DIAGNOSIS — C50211 Malignant neoplasm of upper-inner quadrant of right female breast: Secondary | ICD-10-CM | POA: Diagnosis not present

## 2022-08-27 DIAGNOSIS — Z170 Estrogen receptor positive status [ER+]: Secondary | ICD-10-CM | POA: Diagnosis not present

## 2022-08-27 DIAGNOSIS — Z510 Encounter for antineoplastic radiation therapy: Secondary | ICD-10-CM | POA: Diagnosis not present

## 2022-08-27 LAB — RAD ONC ARIA SESSION SUMMARY
Course Elapsed Days: 43
Plan Fractions Treated to Date: 28
Plan Prescribed Dose Per Fraction: 1.8 Gy
Plan Total Fractions Prescribed: 28
Plan Total Prescribed Dose: 50.4 Gy
Reference Point Dosage Given to Date: 50.4 Gy
Reference Point Session Dosage Given: 1.8 Gy
Session Number: 28

## 2022-08-30 ENCOUNTER — Ambulatory Visit: Payer: PPO

## 2022-08-30 ENCOUNTER — Other Ambulatory Visit: Payer: Self-pay

## 2022-08-30 ENCOUNTER — Ambulatory Visit
Admission: RE | Admit: 2022-08-30 | Discharge: 2022-08-30 | Disposition: A | Discharge status: Home / Self Care | Payer: PPO | Source: Ambulatory Visit | Attending: Radiation Oncology | Admitting: Radiation Oncology

## 2022-08-30 DIAGNOSIS — C50211 Malignant neoplasm of upper-inner quadrant of right female breast: Secondary | ICD-10-CM | POA: Diagnosis not present

## 2022-08-30 LAB — RAD ONC ARIA SESSION SUMMARY
Course Elapsed Days: 46
Plan Fractions Treated to Date: 1
Plan Prescribed Dose Per Fraction: 2 Gy
Plan Total Fractions Prescribed: 8
Plan Total Prescribed Dose: 16 Gy
Reference Point Dosage Given to Date: 2 Gy
Reference Point Session Dosage Given: 2 Gy
Session Number: 29

## 2022-08-31 ENCOUNTER — Other Ambulatory Visit: Payer: Self-pay

## 2022-08-31 ENCOUNTER — Ambulatory Visit
Admission: RE | Admit: 2022-08-31 | Discharge: 2022-08-31 | Disposition: A | Discharge status: Home / Self Care | Payer: PPO | Source: Ambulatory Visit | Attending: Radiation Oncology | Admitting: Radiation Oncology

## 2022-08-31 DIAGNOSIS — C50211 Malignant neoplasm of upper-inner quadrant of right female breast: Secondary | ICD-10-CM | POA: Diagnosis not present

## 2022-08-31 DIAGNOSIS — Z170 Estrogen receptor positive status [ER+]: Secondary | ICD-10-CM | POA: Diagnosis not present

## 2022-08-31 DIAGNOSIS — Z510 Encounter for antineoplastic radiation therapy: Secondary | ICD-10-CM | POA: Diagnosis not present

## 2022-08-31 LAB — RAD ONC ARIA SESSION SUMMARY
Course Elapsed Days: 47
Plan Fractions Treated to Date: 2
Plan Prescribed Dose Per Fraction: 2 Gy
Plan Total Fractions Prescribed: 8
Plan Total Prescribed Dose: 16 Gy
Reference Point Dosage Given to Date: 4 Gy
Reference Point Session Dosage Given: 2 Gy
Session Number: 30

## 2022-09-01 ENCOUNTER — Ambulatory Visit
Admission: RE | Admit: 2022-09-01 | Discharge: 2022-09-01 | Disposition: A | Discharge status: Home / Self Care | Payer: PPO | Source: Ambulatory Visit | Attending: Radiation Oncology | Admitting: Radiation Oncology

## 2022-09-01 ENCOUNTER — Other Ambulatory Visit: Payer: Self-pay

## 2022-09-01 DIAGNOSIS — C50211 Malignant neoplasm of upper-inner quadrant of right female breast: Secondary | ICD-10-CM | POA: Diagnosis not present

## 2022-09-01 LAB — RAD ONC ARIA SESSION SUMMARY
Course Elapsed Days: 48
Plan Fractions Treated to Date: 3
Plan Prescribed Dose Per Fraction: 2 Gy
Plan Total Fractions Prescribed: 8
Plan Total Prescribed Dose: 16 Gy
Reference Point Dosage Given to Date: 6 Gy
Reference Point Session Dosage Given: 2 Gy
Session Number: 31

## 2022-09-02 ENCOUNTER — Other Ambulatory Visit: Payer: Self-pay

## 2022-09-02 ENCOUNTER — Ambulatory Visit
Admission: RE | Admit: 2022-09-02 | Discharge: 2022-09-02 | Disposition: A | Discharge status: Home / Self Care | Payer: PPO | Source: Ambulatory Visit | Attending: Radiation Oncology | Admitting: Radiation Oncology

## 2022-09-02 DIAGNOSIS — C50211 Malignant neoplasm of upper-inner quadrant of right female breast: Secondary | ICD-10-CM | POA: Diagnosis not present

## 2022-09-02 LAB — RAD ONC ARIA SESSION SUMMARY
Course Elapsed Days: 49
Plan Fractions Treated to Date: 4
Plan Prescribed Dose Per Fraction: 2 Gy
Plan Total Fractions Prescribed: 8
Plan Total Prescribed Dose: 16 Gy
Reference Point Dosage Given to Date: 8 Gy
Reference Point Session Dosage Given: 2 Gy
Session Number: 32

## 2022-09-03 ENCOUNTER — Ambulatory Visit
Admission: RE | Admit: 2022-09-03 | Discharge: 2022-09-03 | Disposition: A | Discharge status: Home / Self Care | Payer: PPO | Source: Ambulatory Visit | Attending: Radiation Oncology | Admitting: Radiation Oncology

## 2022-09-03 ENCOUNTER — Other Ambulatory Visit: Payer: Self-pay

## 2022-09-03 DIAGNOSIS — C50211 Malignant neoplasm of upper-inner quadrant of right female breast: Secondary | ICD-10-CM | POA: Diagnosis not present

## 2022-09-03 LAB — RAD ONC ARIA SESSION SUMMARY
Course Elapsed Days: 50
Plan Fractions Treated to Date: 5
Plan Prescribed Dose Per Fraction: 2 Gy
Plan Total Fractions Prescribed: 8
Plan Total Prescribed Dose: 16 Gy
Reference Point Dosage Given to Date: 10 Gy
Reference Point Session Dosage Given: 2 Gy
Session Number: 33

## 2022-09-06 ENCOUNTER — Ambulatory Visit: Payer: PPO

## 2022-09-06 ENCOUNTER — Other Ambulatory Visit: Payer: Self-pay

## 2022-09-06 ENCOUNTER — Inpatient Hospital Stay: Payer: PPO

## 2022-09-06 ENCOUNTER — Ambulatory Visit
Admission: RE | Admit: 2022-09-06 | Discharge: 2022-09-06 | Disposition: A | Discharge status: Home / Self Care | Payer: PPO | Source: Ambulatory Visit | Attending: Radiation Oncology | Admitting: Radiation Oncology

## 2022-09-06 DIAGNOSIS — C50211 Malignant neoplasm of upper-inner quadrant of right female breast: Secondary | ICD-10-CM | POA: Diagnosis not present

## 2022-09-06 LAB — RAD ONC ARIA SESSION SUMMARY
Course Elapsed Days: 53
Plan Fractions Treated to Date: 6
Plan Prescribed Dose Per Fraction: 2 Gy
Plan Total Fractions Prescribed: 8
Plan Total Prescribed Dose: 16 Gy
Reference Point Dosage Given to Date: 12 Gy
Reference Point Session Dosage Given: 2 Gy
Session Number: 34

## 2022-09-06 LAB — CBC
HCT: 39.7 % (ref 36.0–46.0)
Hemoglobin: 13.5 g/dL (ref 12.0–15.0)
MCH: 31.2 pg (ref 26.0–34.0)
MCHC: 34 g/dL (ref 30.0–36.0)
MCV: 91.7 fL (ref 80.0–100.0)
Platelets: 232 10*3/uL (ref 150–400)
RBC: 4.33 MIL/uL (ref 3.87–5.11)
RDW: 12.9 % (ref 11.5–15.5)
WBC: 6.5 10*3/uL (ref 4.0–10.5)
nRBC: 0 % (ref 0.0–0.2)

## 2022-09-07 ENCOUNTER — Ambulatory Visit
Admission: RE | Admit: 2022-09-07 | Discharge: 2022-09-07 | Disposition: A | Discharge status: Home / Self Care | Payer: PPO | Source: Ambulatory Visit | Attending: Radiation Oncology | Admitting: Radiation Oncology

## 2022-09-07 ENCOUNTER — Other Ambulatory Visit: Payer: Self-pay

## 2022-09-07 ENCOUNTER — Ambulatory Visit: Payer: PPO

## 2022-09-07 DIAGNOSIS — C50211 Malignant neoplasm of upper-inner quadrant of right female breast: Secondary | ICD-10-CM | POA: Diagnosis not present

## 2022-09-07 DIAGNOSIS — Z170 Estrogen receptor positive status [ER+]: Secondary | ICD-10-CM | POA: Diagnosis not present

## 2022-09-07 DIAGNOSIS — Z510 Encounter for antineoplastic radiation therapy: Secondary | ICD-10-CM | POA: Diagnosis not present

## 2022-09-07 LAB — RAD ONC ARIA SESSION SUMMARY
Course Elapsed Days: 54
Plan Fractions Treated to Date: 7
Plan Prescribed Dose Per Fraction: 2 Gy
Plan Total Fractions Prescribed: 8
Plan Total Prescribed Dose: 16 Gy
Reference Point Dosage Given to Date: 14 Gy
Reference Point Session Dosage Given: 2 Gy
Session Number: 35

## 2022-09-08 ENCOUNTER — Other Ambulatory Visit: Payer: Self-pay

## 2022-09-08 ENCOUNTER — Encounter: Payer: Self-pay | Admitting: *Deleted

## 2022-09-08 ENCOUNTER — Ambulatory Visit
Admission: RE | Admit: 2022-09-08 | Discharge: 2022-09-08 | Disposition: A | Discharge status: Home / Self Care | Payer: PPO | Source: Ambulatory Visit | Attending: Radiation Oncology | Admitting: Radiation Oncology

## 2022-09-08 DIAGNOSIS — C50211 Malignant neoplasm of upper-inner quadrant of right female breast: Secondary | ICD-10-CM | POA: Diagnosis not present

## 2022-09-08 LAB — RAD ONC ARIA SESSION SUMMARY
Course Elapsed Days: 55
Plan Fractions Treated to Date: 8
Plan Prescribed Dose Per Fraction: 2 Gy
Plan Total Fractions Prescribed: 8
Plan Total Prescribed Dose: 16 Gy
Reference Point Dosage Given to Date: 16 Gy
Reference Point Session Dosage Given: 2 Gy
Session Number: 36

## 2022-09-12 ENCOUNTER — Other Ambulatory Visit: Payer: Self-pay | Admitting: Family Medicine

## 2022-09-12 DIAGNOSIS — F418 Other specified anxiety disorders: Secondary | ICD-10-CM

## 2022-09-12 DIAGNOSIS — I10 Essential (primary) hypertension: Secondary | ICD-10-CM

## 2022-09-13 ENCOUNTER — Inpatient Hospital Stay: Payer: PPO | Admitting: Licensed Clinical Social Worker

## 2022-09-15 DIAGNOSIS — C50211 Malignant neoplasm of upper-inner quadrant of right female breast: Secondary | ICD-10-CM | POA: Diagnosis not present

## 2022-09-15 DIAGNOSIS — Z170 Estrogen receptor positive status [ER+]: Secondary | ICD-10-CM | POA: Diagnosis not present

## 2022-09-23 ENCOUNTER — Inpatient Hospital Stay: Payer: PPO | Attending: Oncology | Admitting: Licensed Clinical Social Worker

## 2022-09-23 DIAGNOSIS — Z803 Family history of malignant neoplasm of breast: Secondary | ICD-10-CM | POA: Insufficient documentation

## 2022-09-23 DIAGNOSIS — Z90710 Acquired absence of both cervix and uterus: Secondary | ICD-10-CM | POA: Insufficient documentation

## 2022-09-23 DIAGNOSIS — Z8052 Family history of malignant neoplasm of bladder: Secondary | ICD-10-CM | POA: Insufficient documentation

## 2022-09-23 DIAGNOSIS — C50811 Malignant neoplasm of overlapping sites of right female breast: Secondary | ICD-10-CM | POA: Insufficient documentation

## 2022-09-23 DIAGNOSIS — C50011 Malignant neoplasm of nipple and areola, right female breast: Secondary | ICD-10-CM

## 2022-09-23 DIAGNOSIS — M8580 Other specified disorders of bone density and structure, unspecified site: Secondary | ICD-10-CM | POA: Insufficient documentation

## 2022-09-23 DIAGNOSIS — Z170 Estrogen receptor positive status [ER+]: Secondary | ICD-10-CM | POA: Insufficient documentation

## 2022-09-23 DIAGNOSIS — Z79811 Long term (current) use of aromatase inhibitors: Secondary | ICD-10-CM | POA: Insufficient documentation

## 2022-09-23 DIAGNOSIS — Z87891 Personal history of nicotine dependence: Secondary | ICD-10-CM | POA: Insufficient documentation

## 2022-09-23 DIAGNOSIS — Z85828 Personal history of other malignant neoplasm of skin: Secondary | ICD-10-CM | POA: Insufficient documentation

## 2022-09-23 DIAGNOSIS — Z800 Family history of malignant neoplasm of digestive organs: Secondary | ICD-10-CM | POA: Insufficient documentation

## 2022-09-23 NOTE — Assessment & Plan Note (Addendum)
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  Rationale was discussed with patient and she declined chemotherapy.  S/p adjuvant radiation of right breast. Rationale of using aromatase inhibitor -Arimidex  discussed with patient.  Side effects of Arimidex including but not limited to hot flush, joint pain, fatigue, mood swing, osteoporosis discussed with patient. Patient voices understanding and willing to proceed.  Prescription was sent to pharmacy.

## 2022-09-23 NOTE — Progress Notes (Signed)
[place] Social Work  Clinical Social Work was referred by medical provider to review and complete healthcare advance directives.  Clinical Social Worker met with patient and N/A in [place] office.  The patient designated Al Corpus [id number] as their primary healthcare agent and Billy Fischer [phone] as their secondary agent.  Patient also completed healthcare living will.    Clinical Social Worker notarized documents and made copies for patient/family. Clinical Social Worker will send documents to medical records to be scanned into patient's chart. Clinical Social Worker encouraged patient/family to contact with any additional questions or concerns.  Adelene Amas, LCSW, MSW, MA Clinical Social Worker [facility] at [facility], [place] [phone]

## 2022-09-24 ENCOUNTER — Encounter: Payer: Self-pay | Admitting: Oncology

## 2022-09-24 ENCOUNTER — Inpatient Hospital Stay: Payer: PPO | Admitting: Oncology

## 2022-09-24 VITALS — BP 138/104 | HR 85 | Temp 97.3°F | Resp 18 | Wt 191.3 lb

## 2022-09-24 DIAGNOSIS — C50811 Malignant neoplasm of overlapping sites of right female breast: Secondary | ICD-10-CM | POA: Diagnosis not present

## 2022-09-24 DIAGNOSIS — Z79811 Long term (current) use of aromatase inhibitors: Secondary | ICD-10-CM | POA: Diagnosis not present

## 2022-09-24 DIAGNOSIS — Z170 Estrogen receptor positive status [ER+]: Secondary | ICD-10-CM

## 2022-09-24 DIAGNOSIS — M8580 Other specified disorders of bone density and structure, unspecified site: Secondary | ICD-10-CM | POA: Diagnosis not present

## 2022-09-24 DIAGNOSIS — Z809 Family history of malignant neoplasm, unspecified: Secondary | ICD-10-CM

## 2022-09-24 DIAGNOSIS — Z85828 Personal history of other malignant neoplasm of skin: Secondary | ICD-10-CM | POA: Diagnosis not present

## 2022-09-24 DIAGNOSIS — C50211 Malignant neoplasm of upper-inner quadrant of right female breast: Secondary | ICD-10-CM | POA: Diagnosis not present

## 2022-09-24 DIAGNOSIS — Z87891 Personal history of nicotine dependence: Secondary | ICD-10-CM | POA: Diagnosis not present

## 2022-09-24 DIAGNOSIS — Z800 Family history of malignant neoplasm of digestive organs: Secondary | ICD-10-CM | POA: Diagnosis not present

## 2022-09-24 DIAGNOSIS — Z90710 Acquired absence of both cervix and uterus: Secondary | ICD-10-CM | POA: Diagnosis not present

## 2022-09-24 DIAGNOSIS — Z8052 Family history of malignant neoplasm of bladder: Secondary | ICD-10-CM | POA: Diagnosis not present

## 2022-09-24 DIAGNOSIS — Z803 Family history of malignant neoplasm of breast: Secondary | ICD-10-CM | POA: Diagnosis not present

## 2022-09-24 MED ORDER — ANASTROZOLE 1 MG PO TABS: 1.0000 mg | ORAL_TABLET | Freq: Every day | ORAL | 2 refills | Status: DC

## 2022-09-24 NOTE — Assessment & Plan Note (Addendum)
04/15/2022 DEXA showed osteopenia. Recommend patient to take calcium 1200 mg daily and vitamin D supplementation. Repeat DEXA every 2 years

## 2022-09-24 NOTE — Assessment & Plan Note (Signed)
Recommend genetic counseling for genetic testing.  Patient would like to defer until later.   She knows to notify me if she changes her mind.Marland Kitchen

## 2022-09-24 NOTE — Progress Notes (Signed)
Hematology/Oncology Consult Note [phone] [phone]     REFERRING PROVIDER: Martinique, Betty G, MD   Patient Care Team: Martinique, Betty G, MD as PCP - General (Family Medicine) Viona Gilmore, [facility] (Inactive) as Pharmacist (Pharmacist) Daiva Huge, RN as Oncology Nurse Navigator    ASSESSMENT & PLAN:   Cancer Staging  Breast cancer in female [facility]) Staging form: Breast, AJCC 8th Edition - Clinical stage from 05/07/2022: Stage IA (cT1c, cN0, cM0, G2, ER+, PR+, HER2-) - Signed by Earlie Server, MD on 05/07/2022 - Pathologic stage from 05/24/2022: Stage IA (pT1b, pN0, cM0, G2, ER+, PR+, HER2-, Oncotype DX score: 34) - Signed by Earlie Server, MD on 06/15/2022   Breast cancer in female [facility]) Right breast invasive carcinoma, 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  Rationale was discussed with patient and she declined chemotherapy.  S/p adjuvant radiation of right breast. Rationale of using aromatase inhibitor -Arimidex  discussed with patient.  Side effects of Arimidex including but not limited to hot flush, joint pain, fatigue, mood swing, osteoporosis discussed with patient. Patient voices understanding and willing to proceed.  Prescription was sent to pharmacy.  Family history of cancer Recommend genetic counseling for genetic testing.  Patient would like to defer until later.   She knows to notify me if she changes her mind..  Osteopenia 04/15/2022 DEXA showed osteopenia. Recommend patient to take calcium 1200 mg daily and vitamin D supplementation. Repeat DEXA every 2 years   Orders Placed This Encounter  Procedures   CBC with Differential ([place] Only)    Standing Status:   Future    Standing Expiration Date:   09/25/2023   CMP ([place] only)    Standing Status:    Future    Standing Expiration Date:   09/25/2023    Follow up 3 months for evaluation of treatment tolerability. All questions were answered. The patient knows to call the clinic with any problems, questions or concerns.  Earlie Server, MD, PhD [facility] Hematology Oncology 09/24/2022   CHIEF COMPLAINTS/PURPOSE OF CONSULTATION:  Right breast cancer  HISTORY OF PRESENTING ILLNESS:  Burtis Junes 72 y.o. female presents to establish care for right breast cancer  Menarche at age of [age] No child  OCP use: <5 years History of hysterectomy:  Menopausal status: postmenopausal History of HRT use: 7-8 years History of chest radiation: no Number of previous breast biopsies:  no  Family history + bladder cancer, breast cancer I have reviewed her chart and materials related to her cancer extensively and collaborated history with the patient. Summary of oncologic history is as follows: Oncology History  Breast cancer in female [facility])  04/15/2022 Imaging   Bone density showed osteopenia, FRAX score was not calculated due to patient was on estrogen replacement therapy.   04/16/2022 Mammogram   Bilateral screening mammogram In the right breast, a possible mass warrants further evaluation. In the left breast, no findings suspicious for malignancy    04/28/2022 Imaging   Unilateral right breast diagnostic mammogram There is persistence of a 1.7 cm spiculated mass in the medial aspect of the breast. There are no malignant type microcalcifications. Targeted ultrasound is performed, showing an irregular hypoechoic mass in the right breast at 3 o'clock 3 cm from the nipple measuring 1.7 x 0.9 x 0.8 cm.  Sonographic evaluation of  the right axilla does not show any enlarged adenopathy.   05/07/2022 Initial Diagnosis   Breast cancer in female [facility])  04/30/22 s/p right breast biopsy Pathology showed invasive moderately differentiated ductal adenocarcinoma Grade 2.  ER 100% +, PR + 90%, HER2 equivocal IHC  2+, FISH negative.     05/07/2022 Cancer Staging   Staging form: Breast, AJCC 8th Edition - Clinical stage from 05/07/2022: Stage IA (cT1c, cN0, cM0, G2, ER+, PR+, HER2-) - Signed by Earlie Server, MD on 05/07/2022 Stage prefix: Initial diagnosis Histologic grading system: 3 grade system   05/24/2022 Surgery   S/p right lumpectomy and right SLNB   Pathology showed  Invasive mammary carcinoma NOS, Grade 2, DICS present, no LVI, all margins are negative for invasive carcinoma and DCIS. pT1b pN0    05/24/2022 Oncotype testing   34, recurrence rate at 9 years with TAM 22%, absolute chemotherapy benefit >15%.    05/24/2022 Cancer Staging   Staging form: Breast, AJCC 8th Edition - Pathologic stage from 05/24/2022: Stage IA (pT1b, pN0, cM0, G2, ER+, PR+, HER2-, Oncotype DX score: 34) - Signed by Earlie Server, MD on 06/15/2022 Multigene prognostic tests performed: Oncotype DX Recurrence score range: Greater than or equal to 11 Histologic grading system: 3 grade system   06/29/2022 -  Chemotherapy   Patient declined adjuvant chemotherapy TC     07/15/2022 - 08/27/2022 Radiation Therapy   Patient had a right breast adjuvant radiation   Patient presents for follow-up after radiation.  She tolerates well.  Mild erythematous area of right breast.  Otherwise no new complaints.   MEDICAL HISTORY:  Past Medical History:  Diagnosis Date   Actinic keratosis    Anxiety    Arthritis    Back pain    Breast cancer ([place])    Cancer ([place])    basal and squamous cell skin   Chicken pox    Chronic back pain    Complication of anesthesia    hard to wake up after lap cho-had to stay overnight-bp and hr dropped   COPD (chronic obstructive pulmonary disease) ([place])    Depression    Dyspnea    due to copd   Gallbladder problem    GERD (gastroesophageal reflux disease)    Goiter    Hypertension    Hypothyroidism    Joint pain    Lactose intolerance    Migraines    Osteoarthritis    PONV (postoperative  nausea and vomiting)    very nauseated   SOB (shortness of breath)    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, Mohs   Thyroid disease    Thyroid nodule    Tremor    Vitamin D deficiency     SURGICAL HISTORY: Past Surgical History:  Procedure Laterality Date   ABDOMINAL HYSTERECTOMY  1978   BREAST LUMPECTOMY,RADIO FREQ LOCALIZER,AXILLARY SENTINEL LYMPH NODE BIOPSY Right 05/24/2022   Procedure: BREAST LUMPECTOMY,RADIO FREQ LOCALIZER,AXILLARY SENTINEL LYMPH NODE BIOPSY;  Surgeon: Ronny Bacon, MD;  Location: [facility] ORS;  Service: General;  Laterality: Right;   CATARACT EXTRACTION Left 12/20/2017   will have the right one completed a month later    CATARACT EXTRACTION Right    CHOLECYSTECTOMY  2000   COLONOSCOPY  2007   JOINT REPLACEMENT Right 08/19/2017   great toe    TOE SURGERY     UPPER GASTROINTESTINAL ENDOSCOPY  [id number]    SOCIAL HISTORY: Social History   Socioeconomic History   Marital status: Single  Spouse name: Not on file   Number of children: 0   Years of education: 61   Highest education level: Some college, no degree  Occupational History   Occupation: retired    Comment: administration  Tobacco Use   Smoking status: Former    Packs/day: 1.50    Years: 25.00    Total pack years: 37.50    Types: Cigarettes    Quit date: 03/03/1990    Years since quitting: 32.5   Smokeless tobacco: Never  Vaping Use   Vaping Use: Never used  Substance and Sexual Activity   Alcohol use: Not Currently   Drug use: No   Sexual activity: Never  Other Topics Concern   Not on file  Social History Narrative   Lives alone   Caffeine- tea, 1 cup   Social Determinants of Health   Financial Resource Strain: Low Risk  (03/29/2022)   Overall Financial Resource Strain (CARDIA)    Difficulty of Paying Living Expenses: Not very hard  Food Insecurity: No Food Insecurity (03/29/2022)   Hunger Vital Sign    Worried About Running Out of Food in the Last Year: Never true     [place] in the Last Year: Never true  Transportation Needs: No Transportation Needs (03/29/2022)   PRAPARE - Hydrologist (Medical): No    Lack of Transportation (Non-Medical): No  Physical Activity: Sufficiently Active (03/29/2022)   Exercise Vital Sign    Days of Exercise per Week: 3 days    Minutes of Exercise per Session: 60 min  Stress: Stress Concern Present (03/29/2022)   [place]    Feeling of Stress : Very much  Social Connections: Socially Isolated (03/29/2022)   Social Connection and Isolation Panel [NHANES]    Frequency of Communication with Friends and Family: More than three times a week    Frequency of Social Gatherings with Friends and Family: Once a week    Attends Religious Services: Never    Marine scientist or Organizations: No    Attends Music therapist: Not on file    Marital Status: Divorced  Human resources officer Violence: Not on file    FAMILY HISTORY: Family History  Problem Relation Age of Onset   Arthritis Mother    Diabetes Mother    COPD Mother    Heart failure Mother    Stroke Father    Hypertension Father    Hyperlipidemia Father    Diabetes Father    CAD Father    Heart disease Father    Heart failure Father    Breast cancer Maternal Aunt    Cancer Sister        bladder cancer    Throat cancer Brother    Colon cancer Neg Hx    Stomach cancer Neg Hx    Esophageal cancer Neg Hx     ALLERGIES:  is allergic to sulfa antibiotics, hydrocodone, and meloxicam.  MEDICATIONS:  Current Outpatient Medications  Medication Sig Dispense Refill   Acetaminophen (TYLENOL PO) Take 500 mg by mouth 2 (two) times daily.     albuterol (PROAIR HFA) 108 (90 Base) MCG/ACT inhaler Inhale 2 puffs into the lungs every 6 (six) hours as needed for wheezing or shortness of breath. 32 g 3   ALPRAZolam (XANAX) 0.25 MG tablet Take 1 tablet by mouth  twice daily as needed for anxiety 45 tablet 2   anastrozole (ARIMIDEX) 1  MG tablet Take 1 tablet (1 mg total) by mouth daily. 30 tablet 2   atorvastatin (LIPITOR) 10 MG tablet Take 1 tablet (10 mg total) by mouth daily. (Patient taking differently: Take 10 mg by mouth every evening.) 90 tablet 3   azelaic acid (AZELEX) 20 % cream Apply topically 2 (two) times daily. After skin is thoroughly washed and patted dry, gently but thoroughly massage a thin film of azelaic acid cream into the affected area twice daily, in the morning and evening.     Calcium-Vitamin D-Vitamin K 500-100-40 MG-UNT-MCG CHEW Chew 1 tablet by mouth daily. chewable     Cholecalciferol (VITAMIN D3) 125 MCG (5000 UT) CAPS Take 1 capsule by mouth daily.     cyanocobalamin (VITAMIN B12) 500 MCG tablet Take 1,000 mcg by mouth daily.     cyclobenzaprine (FLEXERIL) 5 MG tablet Take 1 tablet (5 mg total) by mouth daily as needed for muscle spasms. (Patient taking differently: Take 5 mg by mouth at bedtime.) 90 tablet 1   levothyroxine (SYNTHROID) 25 MCG tablet TAKE 1 TABLET BY MOUTH ONCE DAILY BEFORE BREAKFAST 90 tablet 2   loratadine (CLARITIN) 10 MG tablet Take 10 mg by mouth daily.     Magnesium 250 MG TABS Take 1 tablet by mouth daily.     meclizine (ANTIVERT) 12.5 MG tablet Take 1 tablet (12.5 mg total) by mouth 3 (three) times daily as needed for dizziness. 60 tablet 1   Multiple Vitamin (MULTIVITAMIN) tablet Take 1 tablet by mouth daily.     pantoprazole (PROTONIX) 40 MG tablet TAKE ONE TABLET BY MOUTH ONE TIME DAILY 90 tablet 3   PARoxetine (PAXIL) 10 MG tablet TAKE ONE TABLET BY MOUTH ONE TIME DAILY 90 tablet 2   propranolol (INDERAL) 40 MG tablet TAKE ONE TABLET BY MOUTH TWICE A DAY 180 tablet 2   RESTASIS 0.05 % ophthalmic emulsion Place 1 drop into both eyes 2 (two) times daily.     VOLTAREN 1 % GEL 2 g as needed.     No current facility-administered medications for this visit.    Review of Systems  Constitutional:   Negative for appetite change, chills, fatigue and fever.  HENT:   Negative for hearing loss and voice change.   Eyes:  Negative for eye problems.  Respiratory:  Negative for chest tightness and cough.   Cardiovascular:  Negative for chest pain.  Gastrointestinal:  Negative for abdominal distention, abdominal pain and blood in stool.  Endocrine: Negative for hot flashes.  Genitourinary:  Negative for difficulty urinating and frequency.   Musculoskeletal:  Negative for arthralgias.  Skin:  Negative for itching and rash.  Neurological:  Negative for extremity weakness.  Hematological:  Negative for adenopathy.  Psychiatric/Behavioral:  Negative for confusion.      PHYSICAL EXAMINATION: ECOG PERFORMANCE STATUS: 0 - Asymptomatic  Vitals:   09/24/22 1008  BP: (!) 138/104  Pulse: 85  Resp: 18  Temp: (!) 97.3 F (36.3 C)  SpO2: 96%   Filed Weights   09/24/22 1008  Weight: 191 lb 4.8 oz (86.8 kg)    Physical Exam Constitutional:      General: She is not in acute distress. HENT:     Head: Normocephalic.  Eyes:     General: No scleral icterus. Cardiovascular:     Rate and Rhythm: Normal rate.  Pulmonary:     Effort: Pulmonary effort is normal. No respiratory distress.  Abdominal:     General: There is no distension.  Musculoskeletal:  General: Normal range of motion.     Cervical back: Normal range of motion and neck supple.  Skin:    Findings: No bruising.  Neurological:     Mental Status: She is alert and oriented to person, place, and time. Mental status is at baseline.     Cranial Nerves: No cranial nerve deficit.     Motor: No abnormal muscle tone.  Psychiatric:        Mood and Affect: Mood and affect normal.       LABORATORY DATA:  I have reviewed the data as listed    Latest Ref Rng & Units 09/06/2022    9:57 AM 08/23/2022    9:44 AM 08/10/2022    9:49 AM  CBC  WBC 4.0 - 10.5 K/uL 6.5  7.6  7.9   Hemoglobin 12.0 - 15.0 g/dL 13.5  12.8  12.7    Hematocrit 36.0 - 46.0 % 39.7  37.9  37.2   Platelets 150 - 400 K/uL 232  236  230       Latest Ref Rng & Units 03/31/2022    2:01 PM 02/16/2021   10:33 AM 08/19/2020   10:34 AM  CMP  Glucose 70 - 99 mg/dL 100  96  108   BUN 6 - 23 mg/dL 16  17  25   $ Creatinine 0.40 - 1.20 mg/dL 0.91  0.96  0.88   Sodium 135 - 145 mEq/L 136  136  134   Potassium 3.5 - 5.1 mEq/L 3.9  4.4  4.6   Chloride 96 - 112 mEq/L 100  102  105   CO2 19 - 32 mEq/L 26  26  22   $ Calcium 8.4 - 10.5 mg/dL 9.0  9.1  9.0   Total Protein 6.0 - 8.3 g/dL 6.6  6.4    Total Bilirubin 0.2 - 1.2 mg/dL 0.5  0.6    Alkaline Phos 39 - 117 U/L 67  67    AST 0 - 37 U/L 21  22    ALT 0 - 35 U/L 23  27       RADIOGRAPHIC STUDIES: I have personally reviewed the radiological images as listed and agreed with the findings in the report. No results found.

## 2022-09-26 ENCOUNTER — Other Ambulatory Visit: Payer: Self-pay

## 2022-09-29 ENCOUNTER — Other Ambulatory Visit: Payer: Self-pay

## 2022-09-29 DIAGNOSIS — Z170 Estrogen receptor positive status [ER+]: Secondary | ICD-10-CM

## 2022-10-09 ENCOUNTER — Other Ambulatory Visit: Payer: Self-pay | Admitting: Family Medicine

## 2022-10-09 DIAGNOSIS — G4700 Insomnia, unspecified: Secondary | ICD-10-CM

## 2022-10-09 DIAGNOSIS — G8929 Other chronic pain: Secondary | ICD-10-CM

## 2022-10-13 ENCOUNTER — Ambulatory Visit
Admission: RE | Admit: 2022-10-13 | Discharge: 2022-10-13 | Disposition: A | Discharge status: Home / Self Care | Payer: PPO | Source: Ambulatory Visit | Attending: Radiation Oncology | Admitting: Radiation Oncology

## 2022-10-13 ENCOUNTER — Encounter: Payer: Self-pay | Admitting: Radiation Oncology

## 2022-10-13 VITALS — BP 134/90 | HR 80 | Temp 99.0°F | Resp 16 | Ht 61.0 in | Wt 191.2 lb

## 2022-10-13 DIAGNOSIS — Z79811 Long term (current) use of aromatase inhibitors: Secondary | ICD-10-CM | POA: Insufficient documentation

## 2022-10-13 DIAGNOSIS — C50211 Malignant neoplasm of upper-inner quadrant of right female breast: Secondary | ICD-10-CM

## 2022-10-13 DIAGNOSIS — Z170 Estrogen receptor positive status [ER+]: Secondary | ICD-10-CM | POA: Diagnosis not present

## 2022-10-13 DIAGNOSIS — Z923 Personal history of irradiation: Secondary | ICD-10-CM | POA: Diagnosis not present

## 2022-10-13 NOTE — Telephone Encounter (Signed)
Pt called to FU on these refills. Pt is asking that MD please send the PA for both to:  [place] [place] Phone: [phone]  Fax: [phone]      LOV:  05/28/22

## 2022-10-13 NOTE — Progress Notes (Signed)
Radiation Oncology Follow up Note  Name: Kristina [name]   Date:   10/13/2022 MRN:  [id number] DOB: 1951-07-21    This 72 y.o. female presents to the clinic today for 1 month follow-up status post whole breast radiation to her right breast for stage Ia ER/PR positive invasive mammary carcinoma.  REFERRING PROVIDER: Martinique, Betty G, MD  HPI: Patient is a 72 year old female now out 1 month having completed whole breast radiation to her right breast for an stage Ia invasive mammary carcinoma.  Seen today in routine follow-up she is doing well.  She specifically denies breast tenderness cough or bone pain.  She has been started on.  Arimidex tolerating it well without side effects.  COMPLICATIONS OF TREATMENT: none  FOLLOW UP COMPLIANCE: keeps appointments   PHYSICAL EXAM:  BP (!) 134/90   Pulse 80   Temp 99 F (37.2 C)   Resp 16   Ht '5\' 1"'$  (1.549 m)   Wt 191 lb 3.2 oz (86.7 kg)   LMP  (LMP Unknown)   BMI 36.13 kg/m  Lungs are clear to A&P cardiac examination essentially unremarkable with regular rate and rhythm. No dominant mass or nodularity is noted in either breast in 2 positions examined. Incision is well-healed. No axillary or supraclavicular adenopathy is appreciated. Cosmetic result is excellent.  Well-developed well-nourished patient in NAD. HEENT reveals PERLA, EOMI, discs not visualized.  Oral cavity is clear. No oral mucosal lesions are identified. Neck is clear without evidence of cervical or supraclavicular adenopathy. Lungs are clear to A&P. Cardiac examination is essentially unremarkable with regular rate and rhythm without murmur rub or thrill. Abdomen is benign with no organomegaly or masses noted. Motor sensory and DTR levels are equal and symmetric in the upper and lower extremities. Cranial nerves II through XII are grossly intact. Proprioception is intact. No peripheral adenopathy or edema is identified. No motor or sensory levels are noted. Crude visual fields are within  normal range.  RADIOLOGY RESULTS: No current films for review  PLAN: At the present time patient is doing well very low side effect profile from her whole breast radiation.  And pleased with her overall progress.  I have asked to see her back in 6 months for follow-up.  She continues on Arimidex without side effect.  Patient is to call with any concerns.       Noreene Filbert, MD

## 2022-10-14 NOTE — Telephone Encounter (Signed)
Pt calling to check progress of this refill. Says if she needs to come in to office to let her know.

## 2022-10-15 ENCOUNTER — Telehealth (INDEPENDENT_AMBULATORY_CARE_PROVIDER_SITE_OTHER): Payer: PPO | Admitting: Family Medicine

## 2022-10-15 VITALS — BP 135/73 | HR 76

## 2022-10-15 DIAGNOSIS — F418 Other specified anxiety disorders: Secondary | ICD-10-CM

## 2022-10-15 DIAGNOSIS — I10 Essential (primary) hypertension: Secondary | ICD-10-CM

## 2022-10-15 DIAGNOSIS — G8929 Other chronic pain: Secondary | ICD-10-CM

## 2022-10-15 DIAGNOSIS — G4700 Insomnia, unspecified: Secondary | ICD-10-CM | POA: Diagnosis not present

## 2022-10-15 DIAGNOSIS — M545 Low back pain, unspecified: Secondary | ICD-10-CM | POA: Diagnosis not present

## 2022-10-15 MED ORDER — CYCLOBENZAPRINE HCL 5 MG PO TABS: 5.0000 mg | ORAL_TABLET | Freq: Every day | ORAL | 1 refills | Status: AC

## 2022-10-15 MED ORDER — ALPRAZOLAM 0.25 MG PO TABS: 0.2500 mg | ORAL_TABLET | Freq: Two times a day (BID) | ORAL | 2 refills | Status: DC | PRN

## 2022-10-15 NOTE — Telephone Encounter (Signed)
Scheduled for today.

## 2022-10-15 NOTE — Progress Notes (Signed)
Virtual Visit via Video Note I connected with Kristina [name] on 10/16/22 by a video enabled telemedicine application and verified that I am speaking with the correct person using two identifiers. Location patient: home Location provider:work office Persons participating in the virtual visit: patient, provider  I discussed the limitations of evaluation and management by telemedicine and the availability of in person appointments. The patient expressed understanding and agreed to proceed.  Chief Complaint  Patient presents with   Follow-up   HPI: Kristina [name] is a 72 year old female with past medical history of HTN, anxiety, breast cancer,GERD, goiter/hypothyroidism,essential tremor, and OA following on some of her chronic medical problems. She was last seen here in the office on 03/31/22. Since her last visit she has been diagnosed with breast cancer, underwent radiation, and currently on anastrozole 1 mg daily, started 2 weeks ago.  Hypertension: Currently she is on propranolol 40 mg bid , which also helps with essential tremor.  She reported some mildly elevated Bps, SBP 140-150 when doing radiation treatment but home BPs have been 130s/70s. Her HR has been between 62 and 76, with one instance of 89/min. Negative for severe/frequent headache, visual changes, chest pain, palpitation, focal weakness, or edema.  Lab Results  Component Value Date   CREATININE 0.91 03/31/2022   BUN 16 03/31/2022   NA 136 03/31/2022   K 3.9 03/31/2022   CL 100 03/31/2022   CO2 26 03/31/2022   Anxiety: Currently she is on alprazolam 0.25 mg twice daily as needed, she took medication more often during the time she did radiation. Alprazolam 0.25 mg at bedtime to help her sleep and sometimes in the morning for acute anxiety. She is also on paroxetine 10 mg daily. Reports increased anxiety due to recent Dx and treatment for  breast cancer. She has completed radiation therapy and has started Anastrozole 1 mg  daily.  She expresses difficulty in exercising and losing weight, with a particular "addiction to sweets." She has been attempting to increase physical activity by walking but reports shortness of breath and fatigue after approximately 3,000 steps. She used to be able to complete between 5,000 and 13,000 steps without issue. She has not been exercising regularly since 05/2022.  She reports experiencing shortness of breath, which is a new symptom. She attributes the decreased exercise tolerance to recent cancer treatments and medication side effects, including joint pain and weight gain. She has been taking Flexeril at night to help with these symptoms.  Hypothyroidism on Levothyroxine 25 mg daily. Lab Results  Component Value Date   TSH 1.36 03/31/2022   Chronic lower back pain: She is currently taking Flexeril  5 mg daily at bedtime, which still helps with pain. Pain is not radiated, no associated saddle anesthesia or bowel/bladder function changes.  ROS: See pertinent positives and negatives per HPI.  Past Medical History:  Diagnosis Date   Actinic keratosis    Anxiety    Arthritis    Back pain    Breast cancer (HCC)    Cancer (HCC)    basal and squamous cell skin   Chicken pox    Chronic back pain    Complication of anesthesia    hard to wake up after lap cho-had to stay overnight-bp and hr dropped   COPD (chronic obstructive pulmonary disease) ([place])    Depression    Dyspnea    due to copd   Gallbladder problem    GERD (gastroesophageal reflux disease)    Goiter    Hypertension  Hypothyroidism    Joint pain    Lactose intolerance    Migraines    Osteoarthritis    PONV (postoperative nausea and vomiting)    very nauseated   SOB (shortness of breath)    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, Mohs   Thyroid disease    Thyroid nodule    Tremor    Vitamin D deficiency     Past Surgical History:  Procedure Laterality Date   ABDOMINAL HYSTERECTOMY  1978    BREAST [place] LYMPH NODE BIOPSY Right 05/24/2022   Procedure: [place] LYMPH NODE BIOPSY;  Surgeon: Ronny Bacon, MD;  Location: [facility] ORS;  Service: General;  Laterality: Right;   CATARACT EXTRACTION Left 12/20/2017   will have the right one completed a month later    CATARACT EXTRACTION Right    CHOLECYSTECTOMY  2000   COLONOSCOPY  2007   JOINT REPLACEMENT Right 08/19/2017   great toe    TOE SURGERY     UPPER GASTROINTESTINAL ENDOSCOPY  [id number]    Family History  Problem Relation Age of Onset   Arthritis Mother    Diabetes Mother    COPD Mother    Heart failure Mother    Stroke Father    Hypertension Father    Hyperlipidemia Father    Diabetes Father    CAD Father    Heart disease Father    Heart failure Father    Breast cancer Maternal Aunt    Cancer Sister        bladder cancer    Throat cancer Brother    Colon cancer Neg Hx    Stomach cancer Neg Hx    Esophageal cancer Neg Hx     Social History   Socioeconomic History   Marital status: Single    Spouse name: Not on file   Number of children: 0   Years of education: 12   Highest education level: Some college, no degree  Occupational History   Occupation: retired    Comment: administration  Tobacco Use   Smoking status: Former    Packs/day: 1.50    Years: 25.00    Total pack years: 37.50    Types: Cigarettes    Quit date: 03/03/1990    Years since quitting: 32.6   Smokeless tobacco: Never  Vaping Use   Vaping Use: Never used  Substance and Sexual Activity   Alcohol use: Not Currently   Drug use: No   Sexual activity: Never  Other Topics Concern   Not on file  Social History Narrative   Lives alone   Caffeine- tea, 1 cup   Social Determinants of Health   Financial Resource Strain: Low Risk  (03/29/2022)   Overall Financial Resource Strain (CARDIA)    Difficulty of Paying Living Expenses: Not very hard   Food Insecurity: No Food Insecurity (03/29/2022)   Hunger Vital Sign    Worried About Running Out of Food in the Last Year: Never true    Ran Out of Food in the Last Year: Never true  Transportation Needs: No Transportation Needs (03/29/2022)   PRAPARE - Hydrologist (Medical): No    Lack of Transportation (Non-Medical): No  Physical Activity: Sufficiently Active (03/29/2022)   Exercise Vital Sign    Days of Exercise per Week: 3 days    Minutes of Exercise per Session: 60 min  Stress: Stress Concern Present (03/29/2022)   [employer] - Occupational Stress Questionnaire    Feeling of Stress : Very much  Social Connections: Socially Isolated (03/29/2022)   Social Connection and Isolation Panel [NHANES]    Frequency of Communication with Friends and Family: More than three times a week    Frequency of Social Gatherings with Friends and Family: Once a week    Attends Religious Services: Never    Marine scientist or Organizations: No    Attends Music therapist: Not on file    Marital Status: Divorced  Intimate Partner Violence: Not on file     Current Outpatient Medications:    Acetaminophen (TYLENOL PO), Take 500 mg by mouth 2 (two) times daily., Disp: , Rfl:    albuterol (PROAIR HFA) 108 (90 Base) MCG/ACT inhaler, Inhale 2 puffs into the lungs every 6 (six) hours as needed for wheezing or shortness of breath., Disp: 32 g, Rfl: 3   ALPRAZolam (XANAX) 0.25 MG tablet, Take 1 tablet (0.25 mg total) by mouth 2 (two) times daily as needed. for anxiety, Disp: 45 tablet, Rfl: 2   anastrozole (ARIMIDEX) 1 MG tablet, Take 1 tablet (1 mg total) by mouth daily., Disp: 30 tablet, Rfl: 2   atorvastatin (LIPITOR) 10 MG tablet, Take 1 tablet (10 mg total) by mouth daily. (Patient taking differently: Take 10 mg by mouth every evening.), Disp: 90 tablet, Rfl: 3   azelaic acid (AZELEX) 20 % cream, Apply topically 2 (two) times  daily. After skin is thoroughly washed and patted dry, gently but thoroughly massage a thin film of azelaic acid cream into the affected area twice daily, in the morning and evening., Disp: , Rfl:    Calcium-Vitamin D-Vitamin K 500-100-40 MG-UNT-MCG CHEW, Chew 1 tablet by mouth daily. chewable, Disp: , Rfl:    Cholecalciferol (VITAMIN D3) 125 MCG (5000 UT) CAPS, Take 1 capsule by mouth daily., Disp: , Rfl:    cyanocobalamin (VITAMIN B12) 500 MCG tablet, Take 1,000 mcg by mouth daily., Disp: , Rfl:    cyclobenzaprine (FLEXERIL) 5 MG tablet, Take 1 tablet (5 mg total) by mouth at bedtime., Disp: 90 tablet, Rfl: 1   levothyroxine (SYNTHROID) 25 MCG tablet, TAKE 1 TABLET BY MOUTH ONCE DAILY BEFORE BREAKFAST, Disp: 90 tablet, Rfl: 2   loratadine (CLARITIN) 10 MG tablet, Take 10 mg by mouth daily., Disp: , Rfl:    Magnesium 250 MG TABS, Take 1 tablet by mouth daily., Disp: , Rfl:    meclizine (ANTIVERT) 12.5 MG tablet, Take 1 tablet (12.5 mg total) by mouth 3 (three) times daily as needed for dizziness., Disp: 60 tablet, Rfl: 1   Multiple Vitamin (MULTIVITAMIN) tablet, Take 1 tablet by mouth daily., Disp: , Rfl:    pantoprazole (PROTONIX) 40 MG tablet, TAKE ONE TABLET BY MOUTH ONE TIME DAILY, Disp: 90 tablet, Rfl: 3   PARoxetine (PAXIL) 10 MG tablet, TAKE ONE TABLET BY MOUTH ONE TIME DAILY, Disp: 90 tablet, Rfl: 2   propranolol (INDERAL) 40 MG tablet, TAKE ONE TABLET BY MOUTH TWICE A DAY, Disp: 180 tablet, Rfl: 2   RESTASIS 0.05 % ophthalmic emulsion, Place 1 drop into both eyes 2 (two) times daily., Disp: , Rfl:    VOLTAREN 1 % GEL, 2 g as needed., Disp: , Rfl:   EXAM:  VITALS per patient if applicable:  GENERAL: alert, oriented, appears well and in no acute distress  HEENT: atraumatic, conjunctiva clear, no obvious abnormalities on inspection of external nose and ears  NECK: normal movements of  the head and neck  LUNGS: on inspection no signs of respiratory distress, breathing rate appears  normal, no obvious gross SOB, gasping or wheezing  CV: no obvious cyanosis  MS: moves all visible extremities without noticeable abnormality  PSYCH/NEURO: pleasant and cooperative, no obvious depression or anxiety, speech and thought processing grossly intact  ASSESSMENT AND PLAN:  Discussed the following assessment and plan:  Essential hypertension  Insomnia, unspecified type - Plan: ALPRAZolam (XANAX) 0.25 MG tablet  Chronic low back pain, unspecified back pain laterality, unspecified whether sciatica present - Plan: cyclobenzaprine (FLEXERIL) 5 MG tablet  Other specified anxiety disorders  Essential hypertension Assessment & Plan: BP adequately controlled. Continue propranolol 40 mg twice daily. Low-salt/DASH diet to continue. Monitor BP regularly.   Insomnia, unspecified type Assessment & Plan: Alprazolam 0.25 mg at bedtime helps. Continue good sleep hygiene.  Orders: -     ALPRAZolam; Take 1 tablet (0.25 mg total) by mouth 2 (two) times daily as needed. for anxiety  Dispense: 45 tablet; Refill: 2  Chronic low back pain, unspecified back pain laterality, unspecified whether sciatica present Assessment & Plan: Problem has been stable. Continue Flexeril 5 mg at bedtime as needed, we discussed some side effects as well as the risk for med interaction with Paroxetine specially.  Orders: -     Cyclobenzaprine HCl; Take 1 tablet (5 mg total) by mouth at bedtime.  Dispense: 90 tablet; Refill: 1  Other specified anxiety disorders Assessment & Plan: It was mildly worse after recent Dx and during treatment for breast cancer. Continue Paroxetine 10 mg daily and  alprazolam 0.25 mg twice daily as needed. Follow-up in 6 months, before if needed.   We discussed possible serious and likely etiologies, options for evaluation and workup, limitations of telemedicine visit vs in person visit, treatment, treatment risks and precautions. The patient was advised to call back or seek  an in-person evaluation if the symptoms worsen or if the condition fails to improve as anticipated. I discussed the assessment and treatment plan with the patient. The patient was provided an opportunity to ask questions and all were answered. The patient agreed with the plan and demonstrated an understanding of the instructions.  Return in about 6 months (around 04/17/2023) for chronic problems, Labs.  Tim Wilhide Martinique, MD

## 2022-10-16 ENCOUNTER — Encounter: Payer: Self-pay | Admitting: Family Medicine

## 2022-10-16 NOTE — Assessment & Plan Note (Signed)
BP adequately controlled. Continue propranolol 40 mg twice daily. Low-salt/DASH diet to continue. Monitor BP regularly. 

## 2022-10-16 NOTE — Assessment & Plan Note (Signed)
Problem has been stable. Continue Flexeril 5 mg at bedtime as needed, we discussed some side effects as well as the risk for med interaction with Paroxetine specially.

## 2022-10-16 NOTE — Assessment & Plan Note (Signed)
It was mildly worse after recent Dx and during treatment for breast cancer. Continue Paroxetine 10 mg daily and  alprazolam 0.25 mg twice daily as needed. Follow-up in 6 months, before if needed.

## 2022-10-16 NOTE — Assessment & Plan Note (Signed)
Alprazolam 0.25 mg at bedtime helps. Continue good sleep hygiene.

## 2022-10-19 ENCOUNTER — Ambulatory Visit: Payer: PPO | Admitting: Dermatology

## 2022-10-19 VITALS — BP 129/93 | HR 73

## 2022-10-19 DIAGNOSIS — L578 Other skin changes due to chronic exposure to nonionizing radiation: Secondary | ICD-10-CM | POA: Diagnosis not present

## 2022-10-19 DIAGNOSIS — D229 Melanocytic nevi, unspecified: Secondary | ICD-10-CM

## 2022-10-19 DIAGNOSIS — L820 Inflamed seborrheic keratosis: Secondary | ICD-10-CM | POA: Diagnosis not present

## 2022-10-19 DIAGNOSIS — L570 Actinic keratosis: Secondary | ICD-10-CM | POA: Diagnosis not present

## 2022-10-19 DIAGNOSIS — D692 Other nonthrombocytopenic purpura: Secondary | ICD-10-CM

## 2022-10-19 DIAGNOSIS — L309 Dermatitis, unspecified: Secondary | ICD-10-CM | POA: Diagnosis not present

## 2022-10-19 DIAGNOSIS — L814 Other melanin hyperpigmentation: Secondary | ICD-10-CM

## 2022-10-19 DIAGNOSIS — Z85828 Personal history of other malignant neoplasm of skin: Secondary | ICD-10-CM | POA: Diagnosis not present

## 2022-10-19 DIAGNOSIS — D1801 Hemangioma of skin and subcutaneous tissue: Secondary | ICD-10-CM

## 2022-10-19 DIAGNOSIS — Z1283 Encounter for screening for malignant neoplasm of skin: Secondary | ICD-10-CM

## 2022-10-19 DIAGNOSIS — L821 Other seborrheic keratosis: Secondary | ICD-10-CM | POA: Diagnosis not present

## 2022-10-19 MED ORDER — CLOBETASOL PROPIONATE 0.05 % EX CREA: TOPICAL_CREAM | CUTANEOUS | 0 refills | Status: DC

## 2022-10-19 NOTE — Progress Notes (Signed)
Follow-Up Visit   Subjective  Kristina [name] is a 72 y.o. female who presents for the following: TBSE.  The patient presents for Total-Body Skin Exam (TBSE) for skin cancer screening and mole check.  The patient has spots, moles and lesions to be evaluated, some may be new or changing, including right forehead, right upper back, right shoulder. Some are itchy. She hasn't been outdoors. She has a history of BCC/SCC.  The following portions of the chart were reviewed this encounter and updated as appropriate:       Review of Systems:  No other skin or systemic complaints except as noted in HPI or Assessment and Plan.  Objective  Well appearing patient in no apparent distress; mood and affect are within normal limits.  A full examination was performed including scalp, head, eyes, ears, nose, lips, neck, chest, axillae, abdomen, back, buttocks, bilateral upper extremities, bilateral lower extremities, hands, feet, fingers, toes, fingernails, and toenails. All findings within normal limits unless otherwise noted below.  Right Upper Back Scaly erythematous papules and plaques +/- edema. Pink excoriated papules on the right shoulder. Generalized xerosis.  Right Forehead x 2 (2) Erythematous stuck-on, waxy papule   R med cheek Pink scaly macule.    Assessment & Plan  Skin cancer screening performed today.  Actinic Damage - chronic, secondary to cumulative UV radiation exposure/sun exposure over time - diffuse scaly erythematous macules with underlying dyspigmentation - Recommend daily broad spectrum sunscreen SPF 30+ to sun-exposed areas, reapply every 2 hours as needed.  - Recommend staying in the shade or wearing long sleeves, sun glasses (UVA+UVB protection) and wide brim hats (4-inch brim around the entire circumference of the hat). - Call for new or changing lesions.  Seborrheic Keratoses - Stuck-on, waxy, tan-brown papules and/or plaques  - Benign-appearing - Discussed  benign etiology and prognosis. - Observe - Call for any changes  Lentigines - Scattered tan macules - Due to sun exposure - Benign-appearing, observe - Recommend daily broad spectrum sunscreen SPF 30+ to sun-exposed areas, reapply every 2 hours as needed. - Call for any changes  Hemangiomas - Red papules - Discussed benign nature - Observe - Call for any changes  Purpura - Chronic; persistent and recurrent.  Treatable, but not curable. - Violaceous macules and patches - Benign - Related to trauma, age, sun damage and/or use of blood thinners, chronic use of topical and/or oral steroids - Observe - Can use OTC arnica containing moisturizer such as Dermend Bruise Formula if desired - Call for worsening or other concerns  Melanocytic Nevi - Tan-brown and/or pink-flesh-colored symmetric macules and papules - Benign appearing on exam today - Observation - Call clinic for new or changing moles - Recommend daily use of broad spectrum spf 30+ sunscreen to sun-exposed areas.   History of Basal Cell Carcinoma of the Skin - No evidence of recurrence today - Recommend regular full body skin exams - Recommend daily broad spectrum sunscreen SPF 30+ to sun-exposed areas, reapply every 2 hours as needed.  - Call if any new or changing lesions are noted between office visits  History of Squamous Cell Carcinoma of the Skin - No evidence of recurrence today - Recommend regular full body skin exams - Recommend daily broad spectrum sunscreen SPF 30+ to sun-exposed areas, reapply every 2 hours as needed.  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 Condition is bothersome/symptomatic for patient. Currently flared.   Recommend mild soap and moisturizing cream 1-2 times daily.  Gentle skin care handout provided. Sample of Aveeno Eczema Therapy  Gel Cream, Aveeno Intensive, Eucerin Advanced, Cetaphil Cream, Dove body wash.  Start Clobetasol Cream - Spot treat AA rash on body BID until improved dsp 60g 0Rf. Avoid face, groin, axilla.  Topical steroids (such as triamcinolone, fluocinolone, fluocinonide, mometasone, clobetasol, halobetasol, betamethasone, hydrocortisone) can cause thinning and lightening of the skin if they are used for too long in the same area. Your physician has selected the right strength medicine for your problem and area affected on the body. Please use your medication only as directed by your physician to prevent side effects.    clobetasol cream (TEMOVATE) 0.05 % - Right Upper Back Spot treat areas of rash on body twice daily until improved. Avoid face, groin, underarms.  Inflamed seborrheic keratosis (2) Right Forehead x 2  Symptomatic, irritating, patient would like treated.  Destruction of lesion - Right Forehead x 2  Destruction method: cryotherapy   Informed consent: discussed and consent obtained   Lesion destroyed using liquid nitrogen: Yes   Region frozen until ice ball extended beyond lesion: Yes   Outcome: patient tolerated procedure well with no complications   Post-procedure details: wound care instructions given   Additional details:  Prior to procedure, discussed risks of blister formation, small wound, skin dyspigmentation, or rare scar following cryotherapy. Recommend Vaseline ointment to treated areas while healing.   AK (actinic keratosis) R med cheek  Actinic keratoses are precancerous spots that appear secondary to cumulative UV radiation exposure/sun exposure over time. They are chronic with expected duration over 1 year. A portion of actinic keratoses will progress to squamous cell carcinoma of the skin. It is not possible to reliably predict which spots will progress to skin cancer and so treatment is recommended to prevent development of skin cancer.  Recommend daily broad spectrum  sunscreen SPF 30+ to sun-exposed areas, reapply every 2 hours as needed.  Recommend staying in the shade or wearing long sleeves, sun glasses (UVA+UVB protection) and wide brim hats (4-inch brim around the entire circumference of the hat). Call for new or changing lesions.  Destruction of lesion - R med cheek  Destruction method: cryotherapy   Informed consent: discussed and consent obtained   Lesion destroyed using liquid nitrogen: Yes   Region frozen until ice ball extended beyond lesion: Yes   Outcome: patient tolerated procedure well with no complications   Post-procedure details: wound care instructions given   Additional details:  Prior to procedure, discussed risks of blister formation, small wound, skin dyspigmentation, or rare scar following cryotherapy. Recommend Vaseline ointment to treated areas while healing.    Return in about 1 year (around 10/19/2023) for TBSE, Hx SCC, Hx BCC.  IJamesetta Orleans, CMA, am acting as scribe for [name], MD .  Documentation: I have reviewed the above documentation for accuracy and completeness, and I agree with the above.  [name] MD

## 2022-10-19 NOTE — Patient Instructions (Addendum)
Cryotherapy Aftercare  Wash gently with soap and water everyday.   Apply Vaseline and Band-Aid daily until healed.    Start Clobetasol cream - spot treat affected areas itchy rash on body twice daily until improved. Avoid face, groin, underarms, body folds.  Topical steroids (such as triamcinolone, fluocinolone, fluocinonide, mometasone, clobetasol, halobetasol, betamethasone, hydrocortisone) can cause thinning and lightening of the skin if they are used for too long in the same area. Your physician has selected the right strength medicine for your problem and area affected on the body. Please use your medication only as directed by your physician to prevent side effects.    Gentle Skin Care Guide  1. Bathe no more than once a day.  2. Avoid bathing in hot water  3. Use a mild soap like Dove, Vanicream, Cetaphil, CeraVe. Can use Lever 2000 or Cetaphil antibacterial soap  4. Use soap only where you need it. On most days, use it under your arms, between your legs, and on your feet. Let the water rinse other areas unless visibly dirty.  5. When you get out of the bath/shower, use a towel to gently blot your skin dry, don't rub it.  6. While your skin is still a little damp, apply a moisturizing cream such as Vanicream, CeraVe, Cetaphil, Eucerin, Sarna lotion or plain Vaseline Jelly. For hands apply Neutrogena [place] Hand Cream or Excipial Hand Cream.  7. Reapply moisturizer any time you start to itch or feel dry.  8. Sometimes using free and clear laundry detergents can be helpful. Fabric softener sheets should be avoided. Downy Free & Gentle liquid, or any liquid fabric softener that is free of dyes and perfumes, it acceptable to use  9. If your doctor has given you prescription creams you may apply moisturizers over them     Due to recent changes in healthcare laws, you may see results of your pathology and/or laboratory studies on MyChart before the doctors have had a chance to  review them. We understand that in some cases there may be results that are confusing or concerning to you. Please understand that not all results are received at the same time and often the doctors may need to interpret multiple results in order to provide you with the best plan of care or course of treatment. Therefore, we ask that you please give [place] 2 business days to thoroughly review all your results before contacting the office for clarification. Should we see a critical lab result, you will be contacted sooner.   If You Need Anything After Your Visit  If you have any questions or concerns for your doctor, please call our main line at [phone] and press option 4 to reach your doctor's medical assistant. If no one answers, please leave a voicemail as directed and we will return your call as soon as possible. Messages left after 4 pm will be answered the following business day.   You may also send [place] a message via [place]. We typically respond to MyChart messages within 1-2 business days.  For prescription refills, please ask your pharmacy to contact our office. Our fax number is [phone].  If you have an urgent issue when the clinic is closed that cannot wait until the next business day, you can page your doctor at the number below.    Please note that while we do our best to be available for urgent issues outside of office hours, we are not available 24/7.   If you have an urgent  issue and are unable to reach [place], you may choose to seek medical care at your doctor's office, retail clinic, urgent care center, or emergency room.  If you have a medical emergency, please immediately call 911 or go to the emergency department.  Pager Numbers  - Dr. Nehemiah Massed: [phone]  - Dr. Laurence Ferrari: [phone]  - Dr. Nicole Kindred: [phone]  In the event of inclement weather, please call our main line at [phone] for an update on the status of any delays or closures.  Dermatology Medication  Tips: Please keep the boxes that topical medications come in in order to help keep track of the instructions about where and how to use these. Pharmacies typically print the medication instructions only on the boxes and not directly on the medication tubes.   If your medication is too expensive, please contact our office at [phone] option 4 or send [place] a message through [place].   We are unable to tell what your co-pay for medications will be in advance as this is different depending on your insurance coverage. However, we may be able to find a substitute medication at lower cost or fill out paperwork to get insurance to cover a needed medication.   If a prior authorization is required to get your medication covered by your insurance company, please allow [place] 1-2 business days to complete this process.  Drug prices often vary depending on where the prescription is filled and some pharmacies may offer cheaper prices.  The website www.goodrx.com contains coupons for medications through different pharmacies. The prices here do not account for what the cost may be with help from insurance (it may be cheaper with your insurance), but the website can give you the price if you did not use any insurance.  - You can print the associated coupon and take it with your prescription to the pharmacy.  - You may also stop by our office during regular business hours and pick up a GoodRx coupon card.  - If you need your prescription sent electronically to a different pharmacy, notify our office through [facility] or by phone at [phone] option 4.     Si Usted Necesita Algo Despus de Su Visita  Tambin puede enviarnos un mensaje a travs de Pharmacist, community. Por lo general respondemos a los mensajes de MyChart en el transcurso de 1 a 2 das hbiles.  Para renovar recetas, por favor pida a su farmacia que se ponga en contacto con nuestra oficina. Harland Dingwall de fax es [place] [phone].  Si tiene un  asunto urgente cuando la clnica est cerrada y que no puede esperar hasta el siguiente da hbil, puede llamar/localizar a su doctor(a) al nmero que aparece a continuacin.   Por favor, tenga en cuenta que aunque hacemos todo lo posible para estar disponibles para asuntos urgentes fuera del horario de [place], no estamos disponibles las 24 horas del da, los 7 das de la [place].   Si tiene un problema urgente y no puede comunicarse con nosotros, puede optar por buscar atencin mdica  en el consultorio de su doctor(a), en una clnica privada, en un centro de atencin urgente o en una sala de emergencias.  Si tiene Engineering geologist, por favor llame inmediatamente al 911 o vaya a la sala de emergencias.  Nmeros de bper  - Dr. Nehemiah Massed: [phone]  - Dra. Moye: [phone]  - Dra. Nicole Kindred: [phone]  En caso de inclemencias del [place], por favor llame a nuestra lnea principal al [phone] para Ardelia Mems actualizacin  sobre el estado de cualquier retraso o cierre.  Consejos para la medicacin en dermatologa: Por favor, guarde las cajas en las que vienen los medicamentos de uso tpico para ayudarle a seguir las instrucciones sobre dnde y cmo usarlos. Las farmacias generalmente imprimen las instrucciones del medicamento slo en las cajas y no directamente en los tubos del [place].   Si su medicamento es muy caro, por favor, pngase en contacto con Zigmund Daniel llamando al [phone] y presione la opcin 4 o envenos un mensaje a travs de Pharmacist, community.   No podemos decirle cul ser su copago por los medicamentos por adelantado ya que esto es diferente dependiendo de la cobertura de su seguro. Sin embargo, es posible que podamos encontrar un medicamento sustituto a Electrical engineer un formulario para que el seguro cubra el medicamento que se considera necesario.   Si se requiere una autorizacin previa para que su compaa de seguros [place] su medicamento, por favor  permtanos de 1 a 2 das hbiles para completar este proceso.  Los precios de los medicamentos varan con frecuencia dependiendo del Environmental consultant de dnde se surte la receta y alguna farmacias pueden ofrecer precios ms baratos.  El sitio web www.goodrx.com tiene cupones para medicamentos de Airline pilot. Los precios aqu no tienen en cuenta lo que podra costar con la ayuda del seguro (puede ser ms barato con su seguro), pero el sitio web puede darle el precio si no utiliz Research scientist (physical sciences).  - Puede imprimir el cupn correspondiente y llevarlo con su receta a la farmacia.  - Tambin puede pasar por nuestra oficina durante el horario de atencin regular y Charity fundraiser una tarjeta de cupones de GoodRx.  - Si necesita que su receta se enve electrnicamente a una farmacia diferente, informe a nuestra oficina a travs de MyChart de [place] o por telfono llamando al [phone] y presione la opcin 4.

## 2022-11-09 ENCOUNTER — Other Ambulatory Visit: Payer: Self-pay

## 2022-11-10 ENCOUNTER — Ambulatory Visit: Payer: PPO | Admitting: Radiation Oncology

## 2022-11-22 ENCOUNTER — Other Ambulatory Visit: Payer: Self-pay | Admitting: Oncology

## 2022-11-22 ENCOUNTER — Encounter: Payer: Self-pay | Admitting: Oncology

## 2022-11-22 MED ORDER — ANASTROZOLE 1 MG PO TABS: 1.0000 mg | ORAL_TABLET | Freq: Every day | ORAL | 1 refills | Status: DC

## 2022-11-24 ENCOUNTER — Ambulatory Visit
Admission: RE | Admit: 2022-11-24 | Discharge: 2022-11-24 | Disposition: A | Discharge status: Home / Self Care | Payer: PPO | Source: Ambulatory Visit | Attending: Surgery | Admitting: Surgery

## 2022-11-24 DIAGNOSIS — C50211 Malignant neoplasm of upper-inner quadrant of right female breast: Secondary | ICD-10-CM | POA: Diagnosis not present

## 2022-11-24 DIAGNOSIS — R92321 Mammographic fibroglandular density, right breast: Secondary | ICD-10-CM | POA: Diagnosis not present

## 2022-11-24 DIAGNOSIS — Z170 Estrogen receptor positive status [ER+]: Secondary | ICD-10-CM | POA: Insufficient documentation

## 2022-12-01 NOTE — Progress Notes (Unsigned)
[facility] POST-OP OFFICE VISIT  12/01/2022  HPI: Kristina [name] is a 72 y.o. female ***  s/p right breast lumpectomy, sentinel node biopsy.  She has made good progress with her healing.  The dermal burn is clearing nicely.  There is no residual eschar, and there is nearly full epithelial healing over the region.  SURGICAL PATHOLOGY  CASE: [id number]  PATIENT: [name]  Surgical Pathology Report   Specimen Submitted:  A. Breast, right medial  B. Sentinel lymph node 1, right axilla   Clinical History: Right breast cancer   DIAGNOSIS:  A.  Breast, right medial; lumpectomy: AND B. 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)  Tumor Extent: N/A  Lymphatic and/or Vascular Invasion: Not identified  Treatment Effect in the Breast: Not identified   MARGINS  Margin Status for Invasive Carcinoma: All margins negative for invasive  carcinoma       Distance from closest margin: 0.6 mm       Specify closest margin: Posterior   Margin Status for DCIS: All margins negative for DCIS       Distance from DCIS to closest margin: 0.1 mm       Specify closest margin: Posterior   REGIONAL LYMPH NODES  Regional Lymph Node Status: All examined regional lymph nodes negative  for tumor       Total Number of Lymph Nodes Examined (sentinel and non-sentinel): 1        Number of Sentinel Nodes Examined: 1   DISTANT METASTASIS  Distant Site(s)  Involved, if applicable: N/A   PATHOLOGIC STAGE CLASSIFICATION (pTNM, AJCC 8th Edition):  Modified Classification: N/A  pT Category: pT1b  T Suffix: N/A  pN Category: pN0  N Suffix: Sentinel  pM Category: Not applicable   SPECIAL STUDIES  Breast Biomarker Testing Performed on Previous Biopsy.  Estrogen Receptor: 100%, POSITIVE, STRONG STAINING INTENSITY Progesterone Receptor: 90%, POSITIVE, STRONG STAINING INTENSITY Proliferation Marker Ki67: 30% FLUORESCENCE IN-SITU HYBRIDIZATION Results: GROUP 4: HER2 **NEGATIVE**  Vital signs: LMP  (LMP Unknown)    Physical Exam: Constitutional: Appears well  Skin: Incisions on the medial right breast and the axilla are clean, dry and intact.  The linear/radially oriented burn wound on her upper outer quadrant right breast is clean, there is no remarkable exudate, the eschar is completely resolved.  We will continue applying triple antibiotic ointment there with a Band-Aid until complete healing.  CLINICAL DATA:  Status post RIGHT lumpectomy in October 2023 for invasive mammary carcinoma. Patient is status post radiation. Close margins for DCIS posteriorly at 0.1 mm.   EXAM: DIGITAL DIAGNOSTIC UNILATERAL RIGHT MAMMOGRAM WITH TOMOSYNTHESIS   TECHNIQUE: Right digital diagnostic mammography and breast tomosynthesis was performed.   COMPARISON:  Previous exam(s).   ACR Breast Density Category b: There are scattered areas of fibroglandular density.   FINDINGS: There is density and architectural distortion within the RIGHT breast,  consistent with postsurgical changes. These are new in comparison to prior, consistent with interval surgery. A questioned asymmetry in the outer breast resolves with additional views, consistent with overlapping tissue. No suspicious mass, distortion, or microcalcifications are identified to suggest presence of malignancy.   IMPRESSION: No mammographic evidence of malignancy.   RECOMMENDATION: Recommend  bilateral diagnostic mammogram (with RIGHT and LEFT breast ultrasound if deemed necessary) in 6 months. Patient is due for contralateral screening at this point in time.   I have discussed the findings and recommendations with the patient. If applicable, a reminder letter will be sent to the patient regarding the next appointment.   BI-RADS CATEGORY  2: Benign.     Electronically Signed   By: Meda Klinefelter M.D.   On: 11/24/2022 12:59 Assessment/Plan: This is a 72 y.o. female *** s/p breast conservative treatment of her right breast invasive carcinoma.  Path noted above, reviewed above.  Patient Active Problem List   Diagnosis Date Noted   Goals of care, counseling/discussion 06/15/2022   Breast cancer in female 05/07/2022   Family history of cancer 05/07/2022   Spinal stenosis of lumbar region 12/31/2020   Acquired absence of both cervix and uterus 10/01/2020   Age-related osteoporosis without current pathological fracture 10/01/2020   Cervical high risk human papillomavirus (HPV) DNA test positive 10/01/2020   Menopause 10/01/2020   Stress incontinence (female) (female) 10/01/2020   Prediabetes 07/26/2019   Insulin resistance 06/14/2019   Dupuytren's disease of palm 02/24/2019   Trigger finger 02/24/2019   Hypothyroidism 05/19/2018   Vitamin D deficiency 12/16/2017   Class 1 obesity with serious comorbidity and body mass index (BMI) of 30.0 to 30.9 in adult 12/16/2017   Benign essential tremor 07/26/2017   Thyroid nodule 07/04/2017   Essential hypertension 06/07/2017   GERD (gastroesophageal reflux disease) 03/30/2016   Vertigo, benign positional, unspecified laterality 03/30/2016   Goiter, 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  She sees Dr. Cathie Hoops next week, and anticipate radiation oncology unlikely anti- estrogen management.   Campbell Lerner M.D., FACS 12/01/2022, 9:25 AM

## 2022-12-02 ENCOUNTER — Ambulatory Visit (INDEPENDENT_AMBULATORY_CARE_PROVIDER_SITE_OTHER): Payer: PPO | Admitting: Surgery

## 2022-12-02 ENCOUNTER — Encounter: Payer: Self-pay | Admitting: Licensed Clinical Social Worker

## 2022-12-02 ENCOUNTER — Encounter: Payer: Self-pay | Admitting: Surgery

## 2022-12-02 VITALS — BP 145/76 | HR 83 | Temp 98.6°F | Ht 62.0 in | Wt 191.0 lb

## 2022-12-02 DIAGNOSIS — Z170 Estrogen receptor positive status [ER+]: Secondary | ICD-10-CM

## 2022-12-02 DIAGNOSIS — C50211 Malignant neoplasm of upper-inner quadrant of right female breast: Secondary | ICD-10-CM

## 2022-12-02 DIAGNOSIS — Z08 Encounter for follow-up examination after completed treatment for malignant neoplasm: Secondary | ICD-10-CM

## 2022-12-02 DIAGNOSIS — Z9889 Other specified postprocedural states: Secondary | ICD-10-CM

## 2022-12-02 NOTE — Patient Instructions (Addendum)
If you have any concerns or questions, please feel free to call our office. Follow up in 1 year.   Breast Self-Awareness Breast self-awareness is knowing how your breasts look and feel. You need to: Check your breasts on a regular basis. Tell your doctor about any changes. Become familiar with the look and feel of your breasts. This can help you catch a breast problem while it is still small and can be treated. You should do breast self-exams even if you have breast implants. What you need: A mirror. A well-lit room. A pillow or other soft object. How to do a breast self-exam Follow these steps to do a breast self-exam: Look for changes  Take off all the clothes above your waist. Stand in front of a mirror in a room with good lighting. Put your hands down at your sides. Compare your breasts in the mirror. Look for any difference between them, such as: A difference in shape. A difference in size. Wrinkles, dips, and bumps in one breast and not the other. Look at each breast for changes in the skin, such as: Redness. Scaly areas. Skin that has gotten thicker. Dimpling. Open sores (ulcers). Look for changes in your nipples, such as: Fluid coming out of a nipple. Fluid around a nipple. Bleeding. Dimpling. Redness. A nipple that looks pushed in (retracted), or that has changed position. Feel for changes Lie on your back. Feel each breast. To do this: Pick a breast to feel. Place a pillow under the shoulder closest to that breast. Put the arm closest to that breast behind your head. Feel the nipple area of that breast using the hand of your other arm. Feel the area with the pads of your three middle fingers by making small circles with your fingers. Use light, medium, and firm pressure. Continue the overlapping circles, moving downward over the breast. Keep making circles with your fingers. Stop when you feel your ribs. Start making circles with your fingers again, this time going  upward until you reach your collarbone. Then, make circles outward across your breast and into your armpit area. Squeeze your nipple. Check for discharge and lumps. Repeat these steps to check your other breast. Sit or stand in the tub or shower. With soapy water on your skin, feel each breast the same way you did when you were lying down. Write down what you find Writing down what you find can help you remember what to tell your doctor. Write down: What is normal for each breast. Any changes you find in each breast. These include: The kind of changes you find. A tender or painful breast. Any lump you find. Write down its size and where it is. When you last had your monthly period (menstrual cycle). General tips If you are breastfeeding, the best time to check your breasts is after you feed your baby or after you use a breast pump. If you get monthly bleeding, the best time to check your breasts is 5-7 days after your monthly cycle ends. With time, you will become comfortable with the self-exam. You will also start to know if there are changes in your breasts. Contact a doctor if: You see a change in the shape or size of your breasts or nipples. You see a change in the skin of your breast or nipples, such as red or scaly skin. You have fluid coming from your nipples that is not normal. You find a new lump or thick area. You have breast pain. You   have any concerns about your breast health. Summary Breast self-awareness includes looking for changes in your breasts and feeling for changes within your breasts. You should do breast self-awareness in front of a mirror in a well-lit room. If you get monthly periods (menstrual cycles), the best time to check your breasts is 5-7 days after your period ends. Tell your doctor about any changes you see in your breasts. Changes include changes in size, changes on the skin, painful or tender breasts, or fluid from your nipples that is not  normal. This information is not intended to replace advice given to you by your health care provider. Make sure you discuss any questions you have with your health care provider. Document Revised: 12/31/2021 Document Reviewed: 05/28/2021 Elsevier Patient Education  2023 Elsevier Inc.  

## 2022-12-02 NOTE — Progress Notes (Signed)
[facility] CSW Progress Note  Clinical Social Worker  spoke with patient  to discuss invoice for Advance Directives clinic on 09/23/2022.  Patient stated she received a bill from [facility] for a clinic visit on the day as her AD clinic visit.  Patient stated she spoke with Trula Ore w/ [employer] who stated [name] billed [facility] by mistake.  Patient spoke with [facility] billing on 4/9 who asked the patient to call back in two weeks.  Patient stated she would contact [facility] with [facility] billing today. Patient stated the bill shows a clinical visit and on her MyChart it shows the appointment as a social work visit.  CSW checked patient appointment information in the chart it shows the patient did not have a co-pay or pre-pay.  Patient stated she will contact CSW if she has further questions.  CSW updated supervisor on this matter.    Joseph Art, LCSW

## 2022-12-23 ENCOUNTER — Inpatient Hospital Stay: Payer: PPO | Attending: Oncology

## 2022-12-23 ENCOUNTER — Inpatient Hospital Stay (HOSPITAL_BASED_OUTPATIENT_CLINIC_OR_DEPARTMENT_OTHER): Payer: PPO | Admitting: Oncology

## 2022-12-23 ENCOUNTER — Encounter: Payer: Self-pay | Admitting: Oncology

## 2022-12-23 VITALS — BP 146/72 | HR 70 | Temp 96.7°F | Resp 18 | Wt 189.8 lb

## 2022-12-23 DIAGNOSIS — Z90710 Acquired absence of both cervix and uterus: Secondary | ICD-10-CM | POA: Insufficient documentation

## 2022-12-23 DIAGNOSIS — Z85828 Personal history of other malignant neoplasm of skin: Secondary | ICD-10-CM | POA: Insufficient documentation

## 2022-12-23 DIAGNOSIS — C50211 Malignant neoplasm of upper-inner quadrant of right female breast: Secondary | ICD-10-CM

## 2022-12-23 DIAGNOSIS — Z87891 Personal history of nicotine dependence: Secondary | ICD-10-CM | POA: Diagnosis not present

## 2022-12-23 DIAGNOSIS — M8580 Other specified disorders of bone density and structure, unspecified site: Secondary | ICD-10-CM | POA: Diagnosis not present

## 2022-12-23 DIAGNOSIS — Z803 Family history of malignant neoplasm of breast: Secondary | ICD-10-CM | POA: Insufficient documentation

## 2022-12-23 DIAGNOSIS — Z800 Family history of malignant neoplasm of digestive organs: Secondary | ICD-10-CM | POA: Insufficient documentation

## 2022-12-23 DIAGNOSIS — Z8052 Family history of malignant neoplasm of bladder: Secondary | ICD-10-CM | POA: Insufficient documentation

## 2022-12-23 DIAGNOSIS — M2550 Pain in unspecified joint: Secondary | ICD-10-CM | POA: Diagnosis not present

## 2022-12-23 DIAGNOSIS — R7989 Other specified abnormal findings of blood chemistry: Secondary | ICD-10-CM | POA: Insufficient documentation

## 2022-12-23 DIAGNOSIS — Z79811 Long term (current) use of aromatase inhibitors: Secondary | ICD-10-CM | POA: Diagnosis not present

## 2022-12-23 DIAGNOSIS — C50811 Malignant neoplasm of overlapping sites of right female breast: Secondary | ICD-10-CM | POA: Diagnosis not present

## 2022-12-23 DIAGNOSIS — Z170 Estrogen receptor positive status [ER+]: Secondary | ICD-10-CM

## 2022-12-23 DIAGNOSIS — R197 Diarrhea, unspecified: Secondary | ICD-10-CM | POA: Insufficient documentation

## 2022-12-23 LAB — CBC WITH DIFFERENTIAL (CANCER CENTER ONLY)
Abs Immature Granulocytes: 0.02 10*3/uL (ref 0.00–0.07)
Basophils Absolute: 0.1 10*3/uL (ref 0.0–0.1)
Basophils Relative: 1 %
Eosinophils Absolute: 0.4 10*3/uL (ref 0.0–0.5)
Eosinophils Relative: 5 %
HCT: 38.2 % (ref 36.0–46.0)
Hemoglobin: 12.7 g/dL (ref 12.0–15.0)
Immature Granulocytes: 0 %
Lymphocytes Relative: 18 %
Lymphs Abs: 1.4 10*3/uL (ref 0.7–4.0)
MCH: 30.8 pg (ref 26.0–34.0)
MCHC: 33.2 g/dL (ref 30.0–36.0)
MCV: 92.5 fL (ref 80.0–100.0)
Monocytes Absolute: 0.7 10*3/uL (ref 0.1–1.0)
Monocytes Relative: 9 %
Neutro Abs: 5.1 10*3/uL (ref 1.7–7.7)
Neutrophils Relative %: 67 %
Platelet Count: 264 10*3/uL (ref 150–400)
RBC: 4.13 MIL/uL (ref 3.87–5.11)
RDW: 12.6 % (ref 11.5–15.5)
WBC Count: 7.6 10*3/uL (ref 4.0–10.5)
nRBC: 0 % (ref 0.0–0.2)

## 2022-12-23 LAB — CMP (CANCER CENTER ONLY)
ALT: 22 U/L (ref 0–44)
AST: 25 U/L (ref 15–41)
Albumin: 3.6 g/dL (ref 3.5–5.0)
Alkaline Phosphatase: 76 U/L (ref 38–126)
Anion gap: 10 (ref 5–15)
BUN: 17 mg/dL (ref 8–23)
CO2: 22 mmol/L (ref 22–32)
Calcium: 8.9 mg/dL (ref 8.9–10.3)
Chloride: 106 mmol/L (ref 98–111)
Creatinine: 1.02 mg/dL — ABNORMAL HIGH (ref 0.44–1.00)
GFR, Estimated: 58 mL/min — ABNORMAL LOW (ref >60)
Glucose, Bld: 117 mg/dL — ABNORMAL HIGH (ref 70–99)
Potassium: 4.1 mmol/L (ref 3.5–5.1)
Sodium: 138 mmol/L (ref 135–145)
Total Bilirubin: 0.6 mg/dL (ref 0.3–1.2)
Total Protein: 6.9 g/dL (ref 6.5–8.1)

## 2022-12-23 MED ORDER — ANASTROZOLE 1 MG PO TABS: 1.0000 mg | ORAL_TABLET | Freq: Every day | ORAL | 1 refills | Status: DC

## 2022-12-23 NOTE — Assessment & Plan Note (Signed)
Possibly due to recent NSADIS use.  Encourage oral hydration and avoid nephrotoxins.

## 2022-12-23 NOTE — Assessment & Plan Note (Addendum)
Recommend patient to take Tylenol 650mg  Q6h PRN.

## 2022-12-23 NOTE — Progress Notes (Signed)
Hematology/Oncology Consult Note [phone] [phone]     REFERRING PROVIDER: [place], Betty G, MD   Patient Care Team: [place], Betty G, MD as PCP - General (Family Medicine) Verner Chol, [facility] (Inactive) as Pharmacist (Pharmacist) Hulen Luster, RN as Oncology Nurse Navigator   CHIEF COMPLAINTS/PURPOSE OF CONSULTATION:  Right breast cancer  ASSESSMENT & PLAN:   Cancer Staging  Breast cancer in female [facility]) Staging form: Breast, AJCC 8th Edition - Clinical stage from 05/07/2022: Stage IA (cT1c, cN0, cM0, G2, ER+, PR+, HER2-) - Signed by Rickard Patience, MD on 05/07/2022 - Pathologic stage from 05/24/2022: Stage IA (pT1b, pN0, cM0, G2, ER+, PR+, HER2-, Oncotype DX score: 34) - Signed by Rickard Patience, MD on 06/15/2022   Breast cancer in female [facility]) Right breast invasive carcinoma, 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  S/p adjuvant radiation of right breast. She tolerates-Arimidex  1mg  daily, recommend to continue.  Refills Rx sent to pharmacy.  Right breast mammogram obtained by Dr. Claudine Mouton, results were reviewed.  Repeat bilateral mammogram in 6 months.   Osteopenia 04/15/2022 DEXA showed osteopenia. Recommend patient to take calcium 1200 mg daily and vitamin D supplementation. Repeat DEXA every 2 years  Diarrhea Diarrhea after antibiotics use. ? Infectious etiology c diff, vs other etiologies.  She will follow up with her PCP for further evaluation.   Arthralgia Recommend patient to take Tylenol 650mg  Q6h PRN.    Elevated serum creatinine Possibly due to recent NSADIS use.  Encourage oral hydration and avoid nephrotoxins.     Orders Placed This Encounter  Procedures   CBC with Differential (Cancer Center Only)    Standing Status:   Future    Standing Expiration Date:   12/23/2023   CMP (Cancer Center only)    Standing  Status:   Future    Standing Expiration Date:   12/23/2023    Follow up 6 months All questions were answered. The patient knows to call the clinic with any problems, questions or concerns.  Rickard Patience, MD, PhD [facility] Hematology Oncology 12/23/2022    HISTORY OF PRESENTING ILLNESS:  Kristina [name] 72 y.o. female presents to establish care for right breast cancer  Menarche at age of [age] No child  OCP use: <5 years History of hysterectomy:  Menopausal status: postmenopausal History of HRT use: 7-8 years History of chest radiation: no Number of previous breast biopsies:  no  Family history + bladder cancer, breast cancer I have reviewed her chart and materials related to her cancer extensively and collaborated history with the patient. Summary of oncologic history is as follows: Oncology History  Breast cancer in female [facility])  04/15/2022 Imaging   Bone density showed osteopenia, FRAX score was not calculated due to patient was on estrogen replacement therapy.   04/16/2022 Mammogram   Bilateral screening mammogram In the right breast, a possible mass warrants further evaluation. In the left breast, no findings suspicious for malignancy    04/28/2022 Imaging   Unilateral right breast diagnostic mammogram There is persistence of a 1.7 cm spiculated mass in the medial aspect of the breast. There are no malignant type microcalcifications. Targeted ultrasound is performed, showing an irregular hypoechoic mass in the right breast at 3 o'clock 3 cm from the nipple measuring 1.7 x 0.9 x 0.8 cm.  Sonographic evaluation of the right axilla does not show any enlarged adenopathy.  05/07/2022 Initial Diagnosis   Breast cancer in female [facility])  04/30/22 s/p right breast biopsy Pathology showed invasive moderately differentiated ductal adenocarcinoma Grade 2.  ER 100% +, PR + 90%, HER2 equivocal IHC 2+, FISH negative.     05/07/2022 Cancer Staging   Staging form: Breast, AJCC 8th Edition -  Clinical stage from 05/07/2022: Stage IA (cT1c, cN0, cM0, G2, ER+, PR+, HER2-) - Signed by Rickard Patience, MD on 05/07/2022 Stage prefix: Initial diagnosis Histologic grading system: 3 grade system   05/24/2022 Surgery   S/p right lumpectomy and right SLNB   Pathology showed  Invasive mammary carcinoma NOS, Grade 2, DICS present, no LVI, all margins are negative for invasive carcinoma and DCIS. pT1b pN0    05/24/2022 Oncotype testing   34, recurrence rate at 9 years with TAM 22%, absolute chemotherapy benefit >15%.    05/24/2022 Cancer Staging   Staging form: Breast, AJCC 8th Edition - Pathologic stage from 05/24/2022: Stage IA (pT1b, pN0, cM0, G2, ER+, PR+, HER2-, Oncotype DX score: 34) - Signed by Rickard Patience, MD on 06/15/2022 Multigene prognostic tests performed: Oncotype DX Recurrence score range: Greater than or equal to 11 Histologic grading system: 3 grade system   06/29/2022 -  Chemotherapy   Patient declined adjuvant chemotherapy TC     07/15/2022 - 08/27/2022 Radiation Therapy   Patient had a right breast adjuvant radiation   11/24/2022 Imaging   Unilateral diagnostic mammogram  No mammographic evidence of malignancy.     \   INTERVAL HISTORY CHARO ZARR is a 72 y.o. female who has above history reviewed by me today presents for follow up visit for  Stage IA right breast cancer.  She tolerates Arimidex 1mg  daily with managable side effects, including hot flash, arthralgia. She takes Advil PRN.   She has chronic loose BM since she finished a course of antibiotics for infection since April. No abdominal pain.   MEDICAL HISTORY:  Past Medical History:  Diagnosis Date   Actinic keratosis    Anxiety    Arthritis    Back pain    Breast cancer (HCC)    Cancer (HCC)    basal and squamous cell skin   Chicken pox    Chronic back pain    Complication of anesthesia    hard to wake up after lap cho-had to stay overnight-bp and hr dropped   COPD (chronic obstructive pulmonary  disease) (HCC)    Depression    Dyspnea    due to copd   Gallbladder problem    GERD (gastroesophageal reflux disease)    Goiter    Hypertension    Hypothyroidism    Joint pain    Lactose intolerance    Migraines    Osteoarthritis    PONV (postoperative nausea and vomiting)    very nauseated   SOB (shortness of breath)    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, Mohs   Thyroid disease    Thyroid nodule    Tremor    Vitamin D deficiency     SURGICAL HISTORY: Past Surgical History:  Procedure Laterality Date   ABDOMINAL HYSTERECTOMY  1978   BREAST LUMPECTOMY Right 05/24/2022   BREAST LUMPECTOMY,RADIO FREQ LOCALIZER,AXILLARY SENTINEL LYMPH NODE BIOPSY Right 05/24/2022   Procedure: BREAST LUMPECTOMY,RADIO FREQ LOCALIZER,AXILLARY SENTINEL LYMPH NODE BIOPSY;  Surgeon: Campbell Lerner, MD;  Location: [facility] ORS;  Service: General;  Laterality: Right;   CATARACT EXTRACTION Left 12/20/2017   will have the right one completed a  month later    CATARACT EXTRACTION Right    CHOLECYSTECTOMY  2000   COLONOSCOPY  2007   JOINT REPLACEMENT Right 08/19/2017   great toe    TOE SURGERY     UPPER GASTROINTESTINAL ENDOSCOPY  2007,2014    SOCIAL HISTORY: Social History   Socioeconomic History   Marital status: Single    Spouse name: Not on file   Number of children: 0   Years of education: 12   Highest education level: Some college, no degree  Occupational History   Occupation: retired    Comment: administration  Tobacco Use   Smoking status: Former    Packs/day: 1.50    Years: 25.00    Additional pack years: 0.00    Total pack years: 37.50    Types: Cigarettes    Quit date: 03/03/1990    Years since quitting: 32.8   Smokeless tobacco: Never  Vaping Use   Vaping Use: Never used  Substance and Sexual Activity   Alcohol use: Not Currently   Drug use: No   Sexual activity: Never  Other Topics Concern   Not on file  Social History Narrative   Lives alone   Caffeine-  tea, 1 cup   Social Determinants of Health   Financial Resource Strain: Low Risk  (03/29/2022)   Overall Financial Resource Strain (CARDIA)    Difficulty of Paying Living Expenses: Not very hard  Food Insecurity: No Food Insecurity (03/29/2022)   Hunger Vital Sign    Worried About Running Out of Food in the Last Year: Never true    Ran Out of Food in the Last Year: Never true  Transportation Needs: No Transportation Needs (03/29/2022)   PRAPARE - Administrator, Civil Service (Medical): No    Lack of Transportation (Non-Medical): No  Physical Activity: Sufficiently Active (03/29/2022)   Exercise Vital Sign    Days of Exercise per Week: 3 days    Minutes of Exercise per Session: 60 min  Stress: Stress Concern Present (03/29/2022)   [employer] of Occupational Health - Occupational Stress Questionnaire    Feeling of Stress : Very much  Social Connections: Socially Isolated (03/29/2022)   Social Connection and Isolation Panel [NHANES]    Frequency of Communication with Friends and Family: More than three times a week    Frequency of Social Gatherings with Friends and Family: Once a week    Attends Religious Services: Never    Database administrator or Organizations: No    Attends Engineer, structural: Not on file    Marital Status: Divorced  Catering manager Violence: Not on file    FAMILY HISTORY: Family History  Problem Relation Age of Onset   Arthritis Mother    Diabetes Mother    COPD Mother    Heart failure Mother    Stroke Father    Hypertension Father    Hyperlipidemia Father    Diabetes Father    CAD Father    Heart disease Father    Heart failure Father    Breast cancer Maternal Aunt    Cancer Sister        bladder cancer    Throat cancer Brother    Colon cancer Neg Hx    Stomach cancer Neg Hx    Esophageal cancer Neg Hx     ALLERGIES:  is allergic to sulfa antibiotics, hydrocodone, and meloxicam.  MEDICATIONS:  Current Outpatient  Medications  Medication Sig Dispense Refill   Acetaminophen (TYLENOL  PO) Take 500 mg by mouth 2 (two) times daily.     albuterol (PROAIR HFA) 108 (90 Base) MCG/ACT inhaler Inhale 2 puffs into the lungs every 6 (six) hours as needed for wheezing or shortness of breath. 32 g 3   ALPRAZolam (XANAX) 0.25 MG tablet Take 1 tablet (0.25 mg total) by mouth 2 (two) times daily as needed. for anxiety 45 tablet 2   atorvastatin (LIPITOR) 10 MG tablet Take 1 tablet (10 mg total) by mouth daily. (Patient taking differently: Take 10 mg by mouth every evening.) 90 tablet 3   azelaic acid (AZELEX) 20 % cream Apply topically 2 (two) times daily. After skin is thoroughly washed and patted dry, gently but thoroughly massage a thin film of azelaic acid cream into the affected area twice daily, in the morning and evening.     Calcium-Vitamin D-Vitamin K 500-100-40 MG-UNT-MCG CHEW Chew 1 tablet by mouth daily. chewable     Cholecalciferol (VITAMIN D3) 125 MCG (5000 UT) CAPS Take 1 capsule by mouth daily.     cyanocobalamin (VITAMIN B12) 500 MCG tablet Take 1,000 mcg by mouth daily.     cyclobenzaprine (FLEXERIL) 5 MG tablet Take 1 tablet (5 mg total) by mouth at bedtime. 90 tablet 1   levothyroxine (SYNTHROID) 25 MCG tablet TAKE 1 TABLET BY MOUTH ONCE DAILY BEFORE BREAKFAST 90 tablet 2   loratadine (CLARITIN) 10 MG tablet Take 10 mg by mouth daily.     Magnesium 250 MG TABS Take 1 tablet by mouth daily.     meclizine (ANTIVERT) 12.5 MG tablet Take 1 tablet (12.5 mg total) by mouth 3 (three) times daily as needed for dizziness. 60 tablet 1   Multiple Vitamin (MULTIVITAMIN) tablet Take 1 tablet by mouth daily.     pantoprazole (PROTONIX) 40 MG tablet TAKE ONE TABLET BY MOUTH ONE TIME DAILY 90 tablet 3   PARoxetine (PAXIL) 10 MG tablet TAKE ONE TABLET BY MOUTH ONE TIME DAILY 90 tablet 2   propranolol (INDERAL) 40 MG tablet TAKE ONE TABLET BY MOUTH TWICE A DAY 180 tablet 2   RESTASIS 0.05 % ophthalmic emulsion Place 1 drop  into both eyes 2 (two) times daily.     VOLTAREN 1 % GEL 2 g as needed.     anastrozole (ARIMIDEX) 1 MG tablet Take 1 tablet (1 mg total) by mouth daily. 100 tablet 1   No current facility-administered medications for this visit.    Review of Systems  Constitutional:  Negative for appetite change, chills, fatigue and fever.  HENT:   Negative for hearing loss and voice change.   Eyes:  Negative for eye problems.  Respiratory:  Negative for chest tightness and cough.   Cardiovascular:  Negative for chest pain.  Gastrointestinal:  Positive for diarrhea. Negative for abdominal distention, abdominal pain and blood in stool.  Endocrine: Negative for hot flashes.  Genitourinary:  Negative for difficulty urinating and frequency.   Musculoskeletal:  Negative for arthralgias.  Skin:  Negative for itching and rash.  Neurological:  Negative for extremity weakness.  Hematological:  Negative for adenopathy.  Psychiatric/Behavioral:  Negative for confusion.      PHYSICAL EXAMINATION: ECOG PERFORMANCE STATUS: 0 - Asymptomatic  Vitals:   12/23/22 1011  BP: (!) 146/72  Pulse: 70  Resp: 18  Temp: (!) 96.7 F (35.9 C)  SpO2: 97%   Filed Weights   12/23/22 1011  Weight: 189 lb 12.8 oz (86.1 kg)    Physical Exam Constitutional:  General: She is not in acute distress. HENT:     Head: Normocephalic.  Eyes:     General: No scleral icterus. Cardiovascular:     Rate and Rhythm: Normal rate.  Pulmonary:     Effort: Pulmonary effort is normal. No respiratory distress.  Abdominal:     General: There is no distension.  Musculoskeletal:        General: Normal range of motion.     Cervical back: Normal range of motion and neck supple.  Skin:    Findings: No bruising.  Neurological:     Mental Status: She is alert and oriented to person, place, and time. Mental status is at baseline.     Cranial Nerves: No cranial nerve deficit.     Motor: No abnormal muscle tone.  Psychiatric:         Mood and Affect: Mood and affect normal.       LABORATORY DATA:  I have reviewed the data as listed    Latest Ref Rng & Units 12/23/2022   10:01 AM 09/06/2022    9:57 AM 08/23/2022    9:44 AM  CBC  WBC 4.0 - 10.5 K/uL 7.6  6.5  7.6   Hemoglobin 12.0 - 15.0 g/dL 16.1  09.6  04.5   Hematocrit 36.0 - 46.0 % 38.2  39.7  37.9   Platelets 150 - 400 K/uL 264  232  236       Latest Ref Rng & Units 12/23/2022   10:01 AM 03/31/2022    2:01 PM 02/16/2021   10:33 AM  CMP  Glucose 70 - 99 mg/dL 409  811  96   BUN 8 - 23 mg/dL 17  16  17    Creatinine 0.44 - 1.00 mg/dL 9.14  7.82  9.56   Sodium 135 - 145 mmol/L 138  136  136   Potassium 3.5 - 5.1 mmol/L 4.1  3.9  4.4   Chloride 98 - 111 mmol/L 106  100  102   CO2 22 - 32 mmol/L 22  26  26    Calcium 8.9 - 10.3 mg/dL 8.9  9.0  9.1   Total Protein 6.5 - 8.1 g/dL 6.9  6.6  6.4   Total Bilirubin 0.3 - 1.2 mg/dL 0.6  0.5  0.6   Alkaline Phos 38 - 126 U/L 76  67  67   AST 15 - 41 U/L 25  21  22    ALT 0 - 44 U/L 22  23  27       RADIOGRAPHIC STUDIES: I have personally reviewed the radiological images as listed and agreed with the findings in the report. MM DIAG BREAST TOMO UNI RIGHT  Result Date: 11/24/2022 CLINICAL DATA:  Status post RIGHT lumpectomy in October 2023 for invasive mammary carcinoma. Patient is status post radiation. Close margins for DCIS posteriorly at 0.1 mm. EXAM: DIGITAL DIAGNOSTIC UNILATERAL RIGHT MAMMOGRAM WITH TOMOSYNTHESIS TECHNIQUE: Right digital diagnostic mammography and breast tomosynthesis was performed. COMPARISON:  Previous exam(s). ACR Breast Density Category b: There are scattered areas of fibroglandular density. FINDINGS: There is density and architectural distortion within the RIGHT breast, consistent with postsurgical changes. These are new in comparison to prior, consistent with interval surgery. A questioned asymmetry in the outer breast resolves with additional views, consistent with overlapping tissue. No  suspicious mass, distortion, or microcalcifications are identified to suggest presence of malignancy. IMPRESSION: No mammographic evidence of malignancy. RECOMMENDATION: Recommend bilateral diagnostic mammogram (with RIGHT and LEFT breast ultrasound if deemed necessary)  in 6 months. Patient is due for contralateral screening at this point in time. I have discussed the findings and recommendations with the patient. If applicable, a reminder letter will be sent to the patient regarding the next appointment. BI-RADS CATEGORY  2: Benign. Electronically Signed   By: Meda Klinefelter M.D.   On: 11/24/2022 12:59

## 2022-12-23 NOTE — Assessment & Plan Note (Signed)
04/15/2022 DEXA showed osteopenia. Recommend patient to take calcium 1200 mg daily and vitamin D supplementation. Repeat DEXA every 2 years 

## 2022-12-23 NOTE — Assessment & Plan Note (Addendum)
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  S/p adjuvant radiation of right breast. She tolerates-Arimidex  1mg  daily, recommend to continue.  Refills Rx sent to pharmacy.  Right breast mammogram obtained by Dr. Claudine Mouton, results were reviewed.  Repeat bilateral mammogram in 6 months.

## 2022-12-23 NOTE — Assessment & Plan Note (Signed)
Diarrhea after antibiotics use. ? Infectious etiology c diff, vs other etiologies.  She will follow up with her PCP for further evaluation.

## 2022-12-29 NOTE — Progress Notes (Signed)
ACUTE VISIT Chief Complaint  Patient presents with   Diarrhea    Started abx on 4/11, diarrhea started 4/12 and continuing now.    HPI: [name] is a 72 y.o. female with PMHx significant for hypertension, migraine headaches, COPD, GERD, hypothyroidism,breast cancer,and anxiety here today complaining of  diarrhea since April 12th, after starting treatment with amoxicillin following a tooth extraction on April 11th.    Diarrhea  This is a new problem. The current episode started more than 1 month ago. The problem occurs 2 to 4 times per day. The problem has been unchanged. The stool consistency is described as Watery. The patient states that diarrhea does not awaken her from sleep. Associated symptoms include abdominal pain and bloating. Pertinent negatives include no chills, coughing, fever, headaches, myalgias, sweats, URI or vomiting. Nothing aggravates the symptoms. Risk factors include recent antibiotic use. She has tried anti-motility drug for the symptoms. The treatment provided mild relief.   She mentions having severe intermittent abdominal cramps, which have significantly improved, and a recent onset of mild nausea.   She confirms having approximately four bowel movements per day and notes that while she has previously experienced diarrhea from antibiotics, it has never persisted this long. She is taking an OTC antidiarrhea medication, not sure about name,daily. She has not noted urinary symptoms.  Lab Results  Component Value Date   WBC 7.6 12/23/2022   HGB 12.7 12/23/2022   HCT 38.2 12/23/2022   MCV 92.5 12/23/2022   PLT 264 12/23/2022  Hypothyroidism: Currently she is on levothyroxine 25 mcg daily. Lab Results  Component Value Date   TSH 1.36 03/31/2022   She had recent blood work at Dr [facility] office, mildly abnormal e GFR and Cr. She follows with her every six months with Dr. Parke Poisson.  Lab Results  Component Value Date   CREATININE 1.02 (H) 12/23/2022   BUN 17  12/23/2022   NA 138 12/23/2022   K 4.1 12/23/2022   CL 106 12/23/2022   CO2 22 12/23/2022   COPD: She does not experience coughing, wheezing, or difficulty breathing as long as she uses her Albuterol inhaler daily in the morning. Former smoker, quit in 1991.  Review of Systems  Constitutional:  Negative for chills and fever.  HENT:  Negative for sore throat and trouble swallowing.   Respiratory:  Negative for cough.   Cardiovascular:  Negative for chest pain.  Gastrointestinal:  Positive for abdominal pain, bloating and diarrhea. Negative for vomiting.  Endocrine: Negative for cold intolerance and heat intolerance.  Genitourinary:  Negative for decreased urine volume, dysuria and hematuria.  Musculoskeletal:  Negative for myalgias.  Skin:  Negative for rash.  Neurological:  Negative for syncope and headaches.  See other pertinent positives and negatives in HPI.  Current Outpatient Medications on File Prior to Visit  Medication Sig Dispense Refill   Acetaminophen (TYLENOL PO) Take 500 mg by mouth 2 (two) times daily.     albuterol (PROAIR HFA) 108 (90 Base) MCG/ACT inhaler Inhale 2 puffs into the lungs every 6 (six) hours as needed for wheezing or shortness of breath. 32 g 3   ALPRAZolam (XANAX) 0.25 MG tablet Take 1 tablet (0.25 mg total) by mouth 2 (two) times daily as needed. for anxiety 45 tablet 2   anastrozole (ARIMIDEX) 1 MG tablet Take 1 tablet (1 mg total) by mouth daily. 100 tablet 1   atorvastatin (LIPITOR) 10 MG tablet Take 1 tablet (10 mg total) by mouth daily. (Patient taking  differently: Take 10 mg by mouth every evening.) 90 tablet 3   azelaic acid (AZELEX) 20 % cream Apply topically 2 (two) times daily. After skin is thoroughly washed and patted dry, gently but thoroughly massage a thin film of azelaic acid cream into the affected area twice daily, in the morning and evening.     Calcium-Vitamin D-Vitamin K 500-100-40 MG-UNT-MCG CHEW Chew 1 tablet by mouth daily.  chewable     Cholecalciferol (VITAMIN D3) 125 MCG (5000 UT) CAPS Take 1 capsule by mouth daily.     cyanocobalamin (VITAMIN B12) 500 MCG tablet Take 1,000 mcg by mouth daily.     cyclobenzaprine (FLEXERIL) 5 MG tablet Take 1 tablet (5 mg total) by mouth at bedtime. 90 tablet 1   levothyroxine (SYNTHROID) 25 MCG tablet TAKE 1 TABLET BY MOUTH ONCE DAILY BEFORE BREAKFAST 90 tablet 2   loratadine (CLARITIN) 10 MG tablet Take 10 mg by mouth daily.     Magnesium 250 MG TABS Take 1 tablet by mouth daily.     meclizine (ANTIVERT) 12.5 MG tablet Take 1 tablet (12.5 mg total) by mouth 3 (three) times daily as needed for dizziness. 60 tablet 1   Multiple Vitamin (MULTIVITAMIN) tablet Take 1 tablet by mouth daily.     pantoprazole (PROTONIX) 40 MG tablet TAKE ONE TABLET BY MOUTH ONE TIME DAILY 90 tablet 3   PARoxetine (PAXIL) 10 MG tablet TAKE ONE TABLET BY MOUTH ONE TIME DAILY 90 tablet 2   propranolol (INDERAL) 40 MG tablet TAKE ONE TABLET BY MOUTH TWICE A DAY 180 tablet 2   RESTASIS 0.05 % ophthalmic emulsion Place 1 drop into both eyes 2 (two) times daily.     VOLTAREN 1 % GEL 2 g as needed.     No current facility-administered medications on file prior to visit.    Past Medical History:  Diagnosis Date   Actinic keratosis    Anxiety    Arthritis    Back pain    Breast cancer (HCC)    Cancer (HCC)    basal and squamous cell skin   Chicken pox    Chronic back pain    Complication of anesthesia    hard to wake up after lap cho-had to stay overnight-bp and hr dropped   COPD (chronic obstructive pulmonary disease) (HCC)    Depression    Dyspnea    due to copd   Gallbladder problem    GERD (gastroesophageal reflux disease)    Goiter    Hypertension    Hypothyroidism    Joint pain    Lactose intolerance    Migraines    Osteoarthritis    PONV (postoperative nausea and vomiting)    very nauseated   SOB (shortness of breath)    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, Mohs    Thyroid disease    Thyroid nodule    Tremor    Vitamin D deficiency    Allergies  Allergen Reactions   Sulfa Antibiotics Rash    Rash all over body/fever   Hydrocodone Nausea Only    Per patient    Meloxicam Other (See Comments)    "skin infection on leg"   Social History   Socioeconomic History   Marital status: Single    Spouse name: Not on file   Number of children: 0   Years of education: 12   Highest education level: Some college, no degree  Occupational History   Occupation: retired  Comment: administration  Tobacco Use   Smoking status: Former    Packs/day: 1.50    Years: 25.00    Additional pack years: 0.00    Total pack years: 37.50    Types: Cigarettes    Quit date: 03/03/1990    Years since quitting: 32.8   Smokeless tobacco: Never  Vaping Use   Vaping Use: Never used  Substance and Sexual Activity   Alcohol use: Not Currently   Drug use: No   Sexual activity: Never  Other Topics Concern   Not on file  Social History Narrative   Lives alone   Caffeine- tea, 1 cup   Social Determinants of Health   Financial Resource Strain: Low Risk  (03/29/2022)   Overall Financial Resource Strain (CARDIA)    Difficulty of Paying Living Expenses: Not very hard  Food Insecurity: No Food Insecurity (03/29/2022)   Hunger Vital Sign    Worried About Running Out of Food in the Last Year: Never true    Ran Out of Food in the Last Year: Never true  Transportation Needs: No Transportation Needs (03/29/2022)   PRAPARE - Administrator, Civil Service (Medical): No    Lack of Transportation (Non-Medical): No  Physical Activity: Sufficiently Active (03/29/2022)   Exercise Vital Sign    Days of Exercise per Week: 3 days    Minutes of Exercise per Session: 60 min  Stress: Stress Concern Present (03/29/2022)   [employer] of Occupational Health - Occupational Stress Questionnaire    Feeling of Stress : Very much  Social Connections: Socially Isolated  (03/29/2022)   Social Connection and Isolation Panel [NHANES]    Frequency of Communication with Friends and Family: More than three times a week    Frequency of Social Gatherings with Friends and Family: Once a week    Attends Religious Services: Never    Database administrator or Organizations: No    Attends Engineer, structural: Not on file    Marital Status: Divorced   Vitals:   12/31/22 1116  BP: 126/80  Pulse: 70  Resp: 16  Temp: 97.9 F (36.6 C)  SpO2: 96%   Body mass index is 34.84 kg/m.  Physical Exam Vitals and nursing note reviewed.  Constitutional:      General: She is not in acute distress.    Appearance: She is well-developed.  HENT:     Head: Normocephalic and atraumatic.     Mouth/Throat:     Mouth: Mucous membranes are moist.     Pharynx: Oropharynx is clear.  Eyes:     Conjunctiva/sclera: Conjunctivae normal.  Cardiovascular:     Rate and Rhythm: Normal rate and regular rhythm.     Heart sounds: No murmur heard.    Comments: Trace pitting LE edema, bilateral. Pulmonary:     Effort: Pulmonary effort is normal. No respiratory distress.     Breath sounds: Normal breath sounds.  Abdominal:     General: Bowel sounds are increased.     Palpations: Abdomen is soft. There is no hepatomegaly or mass.     Tenderness: There is no abdominal tenderness.  Skin:    General: Skin is warm.     Findings: No erythema or rash.  Neurological:     General: No focal deficit present.     Mental Status: She is alert and oriented to person, place, and time.     Cranial Nerves: No cranial nerve deficit.     Gait: Gait  normal.  Psychiatric:     Comments: Well groomed, good eye contact.   ASSESSMENT AND PLAN:  Ms. Cada was seen today for persistent diarrhea and nausea.  Diarrhea, unspecified type We discussed possible etiologies. Abdominal cramps have improved but diarrhea has not. Given her history of recent antibiotic use, C. difficile infection is a  concern. Stool studies ordered today. Continue adequate hydration with electrolyte drinks. For now recommend low residual diet. I do not think blood work is needed at this time.  -     C. difficile GDH and Toxin A/B; Future -     Ova and parasite examination; Future -     Stool culture; Future  Nausea without vomiting Mild. She can take Zofran 4 mg daily as needed.  -     Ondansetron HCl; Take 1 tablet (4 mg total) by mouth daily as needed for up to 10 days for nausea or vomiting.  Dispense: 10 tablet; Refill: 0  Chronic obstructive pulmonary disease, unspecified COPD type (HCC) Assessment & Plan: Problem seems to be well-controlled. Continue albuterol inhaler 1 to 2 puff 4 times daily as needed.  Return if symptoms worsen or fail to improve, for keep next appointment.  Danijah Noh G. [place], MD  [facility] [place] office.

## 2022-12-31 ENCOUNTER — Ambulatory Visit (INDEPENDENT_AMBULATORY_CARE_PROVIDER_SITE_OTHER): Payer: PPO | Admitting: Family Medicine

## 2022-12-31 ENCOUNTER — Encounter: Payer: Self-pay | Admitting: Family Medicine

## 2022-12-31 VITALS — BP 126/80 | HR 70 | Temp 97.9°F | Resp 16 | Ht 62.0 in | Wt 190.5 lb

## 2022-12-31 DIAGNOSIS — R197 Diarrhea, unspecified: Secondary | ICD-10-CM | POA: Diagnosis not present

## 2022-12-31 DIAGNOSIS — J449 Chronic obstructive pulmonary disease, unspecified: Secondary | ICD-10-CM

## 2022-12-31 DIAGNOSIS — R110 Nausea: Secondary | ICD-10-CM

## 2022-12-31 MED ORDER — ONDANSETRON HCL 4 MG PO TABS: 4.0000 mg | ORAL_TABLET | Freq: Every day | ORAL | 0 refills | Status: DC | PRN

## 2022-12-31 MED ORDER — ONDANSETRON HCL 4 MG PO TABS
4.0000 mg | ORAL_TABLET | Freq: Every day | ORAL | 0 refills | Status: AC | PRN
Start: 2022-12-31 — End: 2023-01-10

## 2022-12-31 NOTE — Addendum Note (Signed)
Addended by: [name] Devoid on: 12/31/2022 12:24 PM   Modules accepted: Orders

## 2022-12-31 NOTE — Assessment & Plan Note (Signed)
Problem seems to be well-controlled. Continue albuterol inhaler 1 to 2 puff 4 times daily as needed.

## 2022-12-31 NOTE — Patient Instructions (Addendum)
A few things to remember from today's visit:  Diarrhea, unspecified type - Plan: C. difficile GDH and Toxin A/B, Ova and parasite examination, Stool culture  Nausea without vomiting - Plan: ondansetron (ZOFRAN) 4 MG tablet  If you need refills for medications you take chronically, please call your pharmacy. Do not use My Chart to request refills or for acute issues that need immediate attention. If you send a my chart message, it may take a few days to be addressed, specially if I am not in the office.  Please be sure medication list is accurate. If a new problem present, please set up appointment sooner than planned today.

## 2022-12-31 NOTE — Addendum Note (Signed)
Addended by: Weyman Croon E on: 12/31/2022 01:01 PM   Modules accepted: Orders

## 2023-01-04 DIAGNOSIS — R197 Diarrhea, unspecified: Secondary | ICD-10-CM | POA: Diagnosis not present

## 2023-01-05 LAB — C. DIFFICILE GDH AND TOXIN A/B
MICRO NUMBER:: 15008553
SPECIMEN QUALITY:: ADEQUATE

## 2023-01-06 DIAGNOSIS — J3489 Other specified disorders of nose and nasal sinuses: Secondary | ICD-10-CM | POA: Diagnosis not present

## 2023-01-06 DIAGNOSIS — H9313 Tinnitus, bilateral: Secondary | ICD-10-CM | POA: Diagnosis not present

## 2023-01-06 DIAGNOSIS — R42 Dizziness and giddiness: Secondary | ICD-10-CM | POA: Diagnosis not present

## 2023-01-07 LAB — OVA AND PARASITE EXAMINATION
CONCENTRATE RESULT:: NONE SEEN
MICRO NUMBER:: 15008135
SPECIMEN QUALITY:: ADEQUATE
TRICHROME RESULT:: NONE SEEN

## 2023-01-07 LAB — STOOL CULTURE

## 2023-01-07 LAB — C. DIFFICILE GDH AND TOXIN A/B
GDH ANTIGEN: NOT DETECTED
TOXIN A AND B: NOT DETECTED

## 2023-01-08 LAB — STOOL CULTURE: E coli, Shiga toxin Assay: NEGATIVE

## 2023-01-10 LAB — STOOL CULTURE

## 2023-01-29 ENCOUNTER — Other Ambulatory Visit: Payer: Self-pay | Admitting: Family Medicine

## 2023-01-29 DIAGNOSIS — E038 Other specified hypothyroidism: Secondary | ICD-10-CM

## 2023-02-02 ENCOUNTER — Other Ambulatory Visit: Payer: Self-pay

## 2023-02-02 DIAGNOSIS — Z90710 Acquired absence of both cervix and uterus: Secondary | ICD-10-CM | POA: Insufficient documentation

## 2023-02-02 DIAGNOSIS — N951 Menopausal and female climacteric states: Secondary | ICD-10-CM | POA: Insufficient documentation

## 2023-02-07 ENCOUNTER — Encounter: Payer: Self-pay | Admitting: Physician Assistant

## 2023-02-07 ENCOUNTER — Ambulatory Visit: Payer: PPO | Admitting: Physician Assistant

## 2023-02-07 VITALS — BP 147/80 | HR 67 | Temp 98.4°F | Ht 62.0 in | Wt 189.6 lb

## 2023-02-07 DIAGNOSIS — E038 Other specified hypothyroidism: Secondary | ICD-10-CM | POA: Diagnosis not present

## 2023-02-07 DIAGNOSIS — R197 Diarrhea, unspecified: Secondary | ICD-10-CM | POA: Diagnosis not present

## 2023-02-07 DIAGNOSIS — R1084 Generalized abdominal pain: Secondary | ICD-10-CM | POA: Diagnosis not present

## 2023-02-07 MED ORDER — DICYCLOMINE HCL 10 MG PO CAPS
10.0000 mg | ORAL_CAPSULE | Freq: Three times a day (TID) | ORAL | 2 refills | Status: DC
Start: 2023-02-07 — End: 2023-04-21

## 2023-02-07 NOTE — Progress Notes (Signed)
Kristina Amy, PA-C [address]  [address]  [place], [place] [address]  Main: [phone]  Fax: [phone]   Gastroenterology Consultation  Referring Provider:     [place], Kristina G, MD Primary Care Physician:  Kristina [name], Kristina G, MD Primary Gastroenterologist:  Kristina Amy, PA-C / Kristina [name]  Reason for Consultation:     Diarrhea        HPI:   Kristina [name] is a 72 y.o. y/o female referred for consultation & management  by Kristina [name], Kristina G, MD.     PMHx significant for hypertension, migraine headaches, COPD, GERD, hypothyroidism,breast cancer,and anxiety here today complaining of  diarrhea since April 12th, after starting treatment with amoxicillin following a tooth extraction on April 11th.  Having 4 - 5 BMs daily.    Patient states she has had intermittent episodes of diarrhea for 20 years which comes and goes.  However, this is different.  Patient states she is having persistent daily diarrhea up to 5 loose and watery stools per day every day.  Stools float on the water.  She has intermittent lower abdominal cramping.  Denies rectal bleeding or weight loss.  In February 2024 she started anastrozole new medicine for breast cancer treatment.  Had cholecystectomy many years ago.  Stool test 01/04/2023 were negative for C. difficile, stool culture, ova and parasites. Lab 12/23/2022: Normal CBC and CMP except mildly elevated glucose, creatinine 1.02.  Last Screening Colonoscopy 03/2017 by Dr. Concha Se at [facility] was normal.  Repeat in 10 years.  EGD 2014 (In [place])  -1 small stomach polyp, 1 small duodenal polyp, otherwise normal.   Past Medical History:  Diagnosis Date   Actinic keratosis    Anxiety    Arthritis    Back pain    Breast cancer (HCC)    Cancer (HCC)    basal and squamous cell skin   Chicken pox    Chronic back pain    Complication of anesthesia    hard to wake up after lap cho-had to stay overnight-bp and hr dropped   COPD (chronic obstructive  pulmonary disease) (HCC)    Depression    Dyspnea    due to copd   Gallbladder problem    GERD (gastroesophageal reflux disease)    Goiter    Hypertension    Hypothyroidism    Joint pain    Lactose intolerance    Migraines    Osteoarthritis    PONV (postoperative nausea and vomiting)    very nauseated   SOB (shortness of breath)    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, Mohs   Thyroid disease    Thyroid nodule    Tremor    Vitamin D deficiency     Past Surgical History:  Procedure Laterality Date   ABDOMINAL HYSTERECTOMY  1978   BREAST LUMPECTOMY Right 05/24/2022   BREAST LUMPECTOMY,RADIO FREQ LOCALIZER,AXILLARY SENTINEL LYMPH NODE BIOPSY Right 05/24/2022   Procedure: BREAST LUMPECTOMY,RADIO FREQ LOCALIZER,AXILLARY SENTINEL LYMPH NODE BIOPSY;  Surgeon: Campbell Lerner, MD;  Location: [facility] ORS;  Service: General;  Laterality: Right;   CATARACT EXTRACTION Left 12/20/2017   will have the right one completed a month later    CATARACT EXTRACTION Right    CHOLECYSTECTOMY  2000   COLONOSCOPY  2007   JOINT REPLACEMENT Right 08/19/2017   great toe    TOE SURGERY     UPPER GASTROINTESTINAL ENDOSCOPY  [id number]    Prior to Admission medications   Medication  Sig Start Date End Date Taking? Authorizing Provider  Acetaminophen (TYLENOL PO) Take 500 mg by mouth 2 (two) times daily.    [provider]  albuterol (PROAIR HFA) 108 (90 Base) MCG/ACT inhaler Inhale 2 puffs into the lungs every 6 (six) hours as needed for wheezing or shortness of breath. 08/17/22   Kristina [name], Kristina G, MD  ALPRAZolam Prudy Feeler) 0.25 MG tablet Take 1 tablet (0.25 mg total) by mouth 2 (two) times daily as needed. for anxiety 10/15/22   Kristina [name], Kristina G, MD  anastrozole (ARIMIDEX) 1 MG tablet Take 1 tablet (1 mg total) by mouth daily. 12/23/22   Rickard Patience, MD  atorvastatin (LIPITOR) 10 MG tablet Take 1 tablet (10 mg total) by mouth daily. Patient taking differently: Take 10 mg by mouth every evening. 05/05/22    Kristina [name], Kristina G, MD  azelaic acid (AZELEX) 20 % cream Apply topically 2 (two) times daily. After skin is thoroughly washed and patted dry, gently but thoroughly massage a thin film of azelaic acid cream into the affected area twice daily, in the morning and evening.    [provider]  Calcium-Vitamin D-Vitamin K 500-100-40 MG-UNT-MCG CHEW Chew 1 tablet by mouth daily. chewable    [provider]  Cholecalciferol (VITAMIN D3) 125 MCG (5000 UT) CAPS Take 1 capsule by mouth daily.    [provider]  cyanocobalamin (VITAMIN B12) 500 MCG tablet Take 1,000 mcg by mouth daily.    [provider]  cyclobenzaprine (FLEXERIL) 5 MG tablet Take 1 tablet (5 mg total) by mouth at bedtime. 10/15/22   Kristina [name], Kristina G, MD  gentamicin ointment (GARAMYCIN) 0.1 % SMARTSIG:Sparingly Topical 2-3 Times Daily 01/06/23   [provider]  levothyroxine (SYNTHROID) 25 MCG tablet TAKE 1 TABLET BY MOUTH ONCE DAILY BEFORE BREAKFAST 01/31/23   Kristina [name], Kristina G, MD  loratadine (CLARITIN) 10 MG tablet Take 10 mg by mouth daily.    [provider]  Magnesium 250 MG TABS Take 1 tablet by mouth daily.    [provider]  meclizine (ANTIVERT) 12.5 MG tablet Take 1 tablet (12.5 mg total) by mouth 3 (three) times daily as needed for dizziness. 03/31/22   Kristina [name], Kristina G, MD  Multiple Vitamin (MULTIVITAMIN) tablet Take 1 tablet by mouth daily.    [provider]  pantoprazole (PROTONIX) 40 MG tablet TAKE ONE TABLET BY MOUTH ONE TIME DAILY 06/28/22   Kristina [name], Kristina G, MD  PARoxetine (PAXIL) 10 MG tablet TAKE ONE TABLET BY MOUTH ONE TIME DAILY 09/13/22   Kristina [name], Kristina G, MD  propranolol (INDERAL) 40 MG tablet TAKE ONE TABLET BY MOUTH TWICE A DAY 09/13/22   Kristina [name], Kristina G, MD  RESTASIS 0.05 % ophthalmic emulsion Place 1 drop into both eyes 2 (two) times daily. 08/29/19   [provider]  VOLTAREN 1 % GEL 2 [name] as needed. 11/21/19   [provider]    Family  History  Problem Relation Age of Onset   Arthritis Mother    Diabetes Mother    COPD Mother    Heart failure Mother    Stroke Father    Hypertension Father    Hyperlipidemia Father    Diabetes Father    CAD Father    Heart disease Father    Heart failure Father    Breast cancer Maternal Aunt    Cancer Sister        bladder cancer    Throat cancer Brother    Colon cancer Neg Hx  Stomach cancer Neg Hx    Esophageal cancer Neg Hx      Social History   Tobacco Use   Smoking status: Former    Packs/day: 1.50    Years: 25.00    Additional pack years: 0.00    Total pack years: 37.50    Types: Cigarettes    Quit date: 03/03/1990    Years since quitting: 32.9   Smokeless tobacco: Never  Vaping Use   Vaping Use: Never used  Substance Use Topics   Alcohol use: Not Currently   Drug use: No    Allergies as of 02/07/2023 - Review Complete 12/31/2022  Allergen Reaction Noted   Sulfa antibiotics Rash 03/30/2016   Hydrocodone Nausea Only 01/18/2018   Meloxicam Other (See Comments) 11/01/2016    Review of Systems:    All systems reviewed and negative except where noted in HPI.   Physical Exam:  LMP  (LMP Unknown)  No LMP recorded (lmp unknown). Patient has had a hysterectomy. Psych:  Alert and cooperative.  Anxious mood and affect. General:   Alert,  Well-developed, well-nourished, pleasant and cooperative in NAD Head:  Normocephalic and atraumatic. Eyes:  Sclera clear, no icterus.   Conjunctiva pink. Neck:  Supple; no masses or thyromegaly. Lungs:  Respirations even and unlabored.  Clear throughout to auscultation.   No wheezes, crackles, or rhonchi. No acute distress. Heart:  Regular rate and rhythm; no murmurs, clicks, rubs, or gallops. Abdomen:  Normal bowel sounds.  No bruits.  Soft, and obese without masses, hepatosplenomegaly or hernias noted.  Mild bilateral lower abdominal tenderness.  No guarding or rebound tenderness.    Neurologic:  Alert and oriented x3;   grossly normal neurologically. Psych:  Alert and cooperative.  Anxious mood and affect.  Imaging Studies: No results found.  Assessment and Plan:   NYDIA IORIO is a 72 y.o. y/o female has been referred for persistent diarrhea which started after she took amoxicillin for tooth infection.  Recent stool culture, C. difficile, and ova/parasite stool tests were all negative.  However I am still worried about possible C. difficile or other infectious etiology for diarrhea.  I am ordering GI pathogen panel and C. difficile toxin PCR.  Differential for diarrhea also includes irritable bowel syndrome, bile salt diarrhea postcholecystectomy, adverse side effect of medication, microscopic colitis, pancreatic insufficiency, and IBD (least likely).  I am ordering stool studies and colonoscopy for further evaluation.  1.  Diarrhea  Stool studies: GI pathogen panel, C. difficile toxin PCR, fecal pancreatic elastase, fecal calprotectin.  **Check for microscopic colitis  Scheduling Colonoscopy I discussed risks of colonoscopy with patient to include risk of bleeding, colon perforation, and risk of sedation.  Patient expressed understanding and agrees to proceed with colonoscopy.   2.  Lower abdominal cramping, possible IBS?  Trial of dicyclomine 10 Mg 1 tab 3 times daily as needed.  Labs CBC, BMP.  3.  Hypothyroidism  Lab TSH.  Follow up in 4 weeks after colonoscopy.  Also follow-up based on test results and symptoms.  Kristina Amy, PA-C

## 2023-02-08 ENCOUNTER — Encounter: Payer: Self-pay | Admitting: Oncology

## 2023-02-08 DIAGNOSIS — R197 Diarrhea, unspecified: Secondary | ICD-10-CM | POA: Diagnosis not present

## 2023-02-08 LAB — CBC
Hematocrit: 38.9 % (ref 34.0–46.6)
Hemoglobin: 12.5 g/dL (ref 11.1–15.9)
MCH: 30.3 pg (ref 26.6–33.0)
MCHC: 32.1 g/dL (ref 31.5–35.7)
MCV: 94 fL (ref 79–97)
Platelets: 235 10*3/uL (ref 150–450)
RBC: 4.13 x10E6/uL (ref 3.77–5.28)
RDW: 12.8 % (ref 11.7–15.4)
WBC: 7.7 10*3/uL (ref 3.4–10.8)

## 2023-02-08 LAB — BASIC METABOLIC PANEL
BUN/Creatinine Ratio: 17 (ref 12–28)
BUN: 15 mg/dL (ref 8–27)
CO2: 19 mmol/L — ABNORMAL LOW (ref 20–29)
Calcium: 9.2 mg/dL (ref 8.7–10.3)
Chloride: 104 mmol/L (ref 96–106)
Creatinine, Ser: 0.88 mg/dL (ref 0.57–1.00)
Glucose: 114 mg/dL — ABNORMAL HIGH (ref 70–99)
Potassium: 3.9 mmol/L (ref 3.5–5.2)
Sodium: 138 mmol/L (ref 134–144)
eGFR: 70 mL/min/{1.73_m2} (ref >59)

## 2023-02-08 LAB — TSH: TSH: 1.4 u[IU]/mL (ref 0.450–4.500)

## 2023-02-09 LAB — GI PROFILE, STOOL, PCR: Enteropathogenic E coli: NOT DETECTED

## 2023-02-09 LAB — PANCREATIC ELASTASE, FECAL

## 2023-02-09 LAB — CLOSTRIDIUM DIFFICILE BY PCR: Toxigenic C. Difficile by PCR: NEGATIVE

## 2023-02-10 LAB — GI PROFILE, STOOL, PCR
Plesiomonas shigelloides: NOT DETECTED
Vibrio cholerae: NOT DETECTED

## 2023-02-15 LAB — GI PROFILE, STOOL, PCR
Adenovirus F 40/41: NOT DETECTED
Astrovirus: NOT DETECTED
C difficile toxin A/B: NOT DETECTED
Campylobacter: NOT DETECTED
Cryptosporidium: NOT DETECTED
Cyclospora cayetanensis: NOT DETECTED
Entamoeba histolytica: NOT DETECTED
Enteroaggregative E coli: NOT DETECTED
Enterotoxigenic E coli: NOT DETECTED
Giardia lamblia: NOT DETECTED
Norovirus GI/GII: NOT DETECTED
Rotavirus A: NOT DETECTED
Salmonella: NOT DETECTED
Sapovirus: NOT DETECTED
Shiga-toxin-producing E coli: NOT DETECTED
Shigella/Enteroinvasive E coli: NOT DETECTED
Vibrio: NOT DETECTED
Yersinia enterocolitica: NOT DETECTED

## 2023-02-15 LAB — CALPROTECTIN, FECAL: Calprotectin, Fecal: 12 ug/g (ref 0–120)

## 2023-03-01 ENCOUNTER — Telehealth: Payer: Self-pay

## 2023-03-01 NOTE — Telephone Encounter (Signed)
Patient left message on nurse line- she is currently scheduled for Colonoscopy w/ Dr.Vanga 03/29/23. She stating she is having issues with GERD-such as- reflux and is currently taking Pantoprazole- she is requesting to have endoscopy added on .

## 2023-03-01 NOTE — Telephone Encounter (Signed)
Spoke with patient to let her know that EGD has been added on per her request. Follow bowel prep instructions.

## 2023-03-08 ENCOUNTER — Telehealth: Payer: Self-pay

## 2023-03-08 NOTE — Telephone Encounter (Signed)
Left message stating that she is taking anastrazole, and had cortisone shot last Tuesday, now hot flashes are terrible/increased, feeling like something is wrong with heart and then it goes away fullness feeling and skipping. Can dr Cathie Hoops help? Should she call cardiologist? Could it be related to hormones or hot flashes? Call back # [phone]

## 2023-03-10 ENCOUNTER — Other Ambulatory Visit: Payer: Self-pay | Admitting: Family Medicine

## 2023-03-10 DIAGNOSIS — G4700 Insomnia, unspecified: Secondary | ICD-10-CM

## 2023-03-11 NOTE — Telephone Encounter (Signed)
LOV 12/31/22 LVV 10/15/22 Last filled: 12/30/22

## 2023-03-14 ENCOUNTER — Telehealth: Payer: Self-pay

## 2023-03-14 NOTE — Telephone Encounter (Signed)
Pt lmovm requesting call back regarding upcoming procedure... Pt states she also lmovm Fri.Marland KitchenMarland KitchenMarland Kitchen

## 2023-03-14 NOTE — Telephone Encounter (Signed)
Spoke with patient and she wants to add EGD on 03/29/23 she is currently scheduled for colonoscopy. She states her food seems to be getting stuck and history of Gerd. EGD has been added.

## 2023-03-21 ENCOUNTER — Telehealth: Payer: Self-pay

## 2023-03-21 MED ORDER — PEG 3350-KCL-NA BICARB-NACL 420 G PO SOLR: 4000.0000 mL | Freq: Once | ORAL | 0 refills | Status: AC

## 2023-03-21 NOTE — Telephone Encounter (Signed)
Pt called stating her bowel prep is not at the pharmacy. Please send prep to [facility], [address]. Procedure scheduled for 03/29/23.

## 2023-03-21 NOTE — Telephone Encounter (Signed)
Spoke with patient- I let her know Golytely has been sent to pharmacy.

## 2023-03-29 ENCOUNTER — Ambulatory Visit
Admission: RE | Admit: 2023-03-29 | Discharge: 2023-03-29 | Disposition: A | Discharge status: Home / Self Care | Payer: PPO | Attending: Gastroenterology | Admitting: Gastroenterology

## 2023-03-29 ENCOUNTER — Other Ambulatory Visit: Payer: Self-pay

## 2023-03-29 ENCOUNTER — Ambulatory Visit: Payer: PPO | Admitting: Anesthesiology

## 2023-03-29 ENCOUNTER — Encounter
Admission: RE | Disposition: A | Discharge status: Home / Self Care | Payer: Self-pay | Source: Home / Self Care | Attending: Gastroenterology

## 2023-03-29 ENCOUNTER — Encounter: Payer: Self-pay | Admitting: Family Medicine

## 2023-03-29 ENCOUNTER — Encounter: Payer: Self-pay | Admitting: Gastroenterology

## 2023-03-29 DIAGNOSIS — I10 Essential (primary) hypertension: Secondary | ICD-10-CM | POA: Insufficient documentation

## 2023-03-29 DIAGNOSIS — Z85828 Personal history of other malignant neoplasm of skin: Secondary | ICD-10-CM | POA: Insufficient documentation

## 2023-03-29 DIAGNOSIS — J449 Chronic obstructive pulmonary disease, unspecified: Secondary | ICD-10-CM | POA: Insufficient documentation

## 2023-03-29 DIAGNOSIS — R1314 Dysphagia, pharyngoesophageal phase: Secondary | ICD-10-CM | POA: Insufficient documentation

## 2023-03-29 DIAGNOSIS — K2960 Other gastritis without bleeding: Secondary | ICD-10-CM | POA: Diagnosis not present

## 2023-03-29 DIAGNOSIS — Z853 Personal history of malignant neoplasm of breast: Secondary | ICD-10-CM | POA: Insufficient documentation

## 2023-03-29 DIAGNOSIS — D128 Benign neoplasm of rectum: Secondary | ICD-10-CM | POA: Insufficient documentation

## 2023-03-29 DIAGNOSIS — K219 Gastro-esophageal reflux disease without esophagitis: Secondary | ICD-10-CM | POA: Insufficient documentation

## 2023-03-29 DIAGNOSIS — K529 Noninfective gastroenteritis and colitis, unspecified: Secondary | ICD-10-CM

## 2023-03-29 DIAGNOSIS — K3189 Other diseases of stomach and duodenum: Secondary | ICD-10-CM | POA: Diagnosis not present

## 2023-03-29 DIAGNOSIS — M549 Dorsalgia, unspecified: Secondary | ICD-10-CM | POA: Insufficient documentation

## 2023-03-29 DIAGNOSIS — Z87891 Personal history of nicotine dependence: Secondary | ICD-10-CM | POA: Insufficient documentation

## 2023-03-29 DIAGNOSIS — R1319 Other dysphagia: Secondary | ICD-10-CM

## 2023-03-29 DIAGNOSIS — R197 Diarrhea, unspecified: Secondary | ICD-10-CM | POA: Diagnosis not present

## 2023-03-29 DIAGNOSIS — G8929 Other chronic pain: Secondary | ICD-10-CM | POA: Insufficient documentation

## 2023-03-29 DIAGNOSIS — K2970 Gastritis, unspecified, without bleeding: Secondary | ICD-10-CM

## 2023-03-29 HISTORY — PX: COLONOSCOPY WITH PROPOFOL: SHX5780

## 2023-03-29 HISTORY — PX: POLYPECTOMY: SHX5525

## 2023-03-29 HISTORY — PX: ESOPHAGOGASTRODUODENOSCOPY: SHX5428

## 2023-03-29 HISTORY — PX: BIOPSY: SHX5522

## 2023-03-29 SURGERY — COLONOSCOPY WITH PROPOFOL
Anesthesia: General

## 2023-03-29 MED ORDER — PROPOFOL 10 MG/ML IV BOLUS
INTRAVENOUS | Status: AC
  Filled 2023-03-29: qty 40

## 2023-03-29 MED ORDER — LIDOCAINE HCL (PF) 2 % IJ SOLN
INTRAMUSCULAR | Status: AC
  Filled 2023-03-29: qty 5

## 2023-03-29 MED ORDER — GLYCOPYRROLATE 0.2 MG/ML IJ SOLN
INTRAMUSCULAR | Status: AC
  Filled 2023-03-29: qty 1

## 2023-03-29 MED ORDER — GLYCOPYRROLATE 0.2 MG/ML IJ SOLN
INTRAMUSCULAR | Status: DC | PRN
  Administered 2023-03-29: .2 mg via INTRAVENOUS

## 2023-03-29 MED ORDER — PROPOFOL 10 MG/ML IV BOLUS
INTRAVENOUS | Status: AC
  Filled 2023-03-29: qty 20

## 2023-03-29 MED ORDER — SODIUM CHLORIDE 0.9 % IV SOLN: INTRAVENOUS | Status: DC

## 2023-03-29 MED ORDER — LIDOCAINE HCL (CARDIAC) PF 100 MG/5ML IV SOSY
PREFILLED_SYRINGE | INTRAVENOUS | Status: DC | PRN
  Administered 2023-03-29: 100 mg via INTRAVENOUS

## 2023-03-29 MED ORDER — ONDANSETRON HCL 4 MG/2ML IJ SOLN
INTRAMUSCULAR | Status: DC | PRN
Start: 2023-03-29 — End: 2023-03-29
  Administered 2023-03-29: 4 mg via INTRAVENOUS

## 2023-03-29 MED ORDER — ONDANSETRON HCL 4 MG/2ML IJ SOLN
INTRAMUSCULAR | Status: AC
  Filled 2023-03-29: qty 2

## 2023-03-29 MED ORDER — PROPOFOL 10 MG/ML IV BOLUS
INTRAVENOUS | Status: DC | PRN
  Administered 2023-03-29: 30 mg via INTRAVENOUS
  Administered 2023-03-29: 60 mg via INTRAVENOUS
  Administered 2023-03-29: 100 ug/kg/min via INTRAVENOUS
  Administered 2023-03-29: 80 mg via INTRAVENOUS

## 2023-03-29 NOTE — H&P (Signed)
Arlyss Repress, MD [address]  [address]  [place], [place] [address]  Main: [phone]  Fax: [phone] Pager: [phone]  Primary Care Physician:  [place], Betty G, MD Primary Gastroenterologist:  Dr. Arlyss Repress  Pre-Procedure History & Physical: HPI:  Kristina [name] is a 72 y.o. female is here for an endoscopy and colonoscopy.   Past Medical History:  Diagnosis Date   Actinic keratosis    Anxiety    Arthritis    Back pain    Breast cancer (HCC)    Cancer (HCC)    basal and squamous cell skin   Chicken pox    Chronic back pain    Complication of anesthesia    hard to wake up after lap cho-had to stay overnight-bp and hr dropped   COPD (chronic obstructive pulmonary disease) (HCC)    Depression    Dyspnea    due to copd   Gallbladder problem    GERD (gastroesophageal reflux disease)    Goiter    Hypertension    Hypothyroidism    Joint pain    Lactose intolerance    Migraines    Osteoarthritis    PONV (postoperative nausea and vomiting)    very nauseated   SOB (shortness of breath)    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, Mohs   Thyroid disease    Thyroid nodule    Tremor    Vitamin D deficiency     Past Surgical History:  Procedure Laterality Date   ABDOMINAL HYSTERECTOMY  1978   BREAST LUMPECTOMY Right 05/24/2022   BREAST LUMPECTOMY,RADIO FREQ LOCALIZER,AXILLARY SENTINEL LYMPH NODE BIOPSY Right 05/24/2022   Procedure: BREAST LUMPECTOMY,RADIO FREQ LOCALIZER,AXILLARY SENTINEL LYMPH NODE BIOPSY;  Surgeon: Campbell Lerner, MD;  Location: [facility] ORS;  Service: General;  Laterality: Right;   CATARACT EXTRACTION Left 12/20/2017   will have the right one completed a month later    CATARACT EXTRACTION Right    CHOLECYSTECTOMY  2000   COLONOSCOPY  2007   COLONOSCOPY     2018   JOINT REPLACEMENT Right 08/19/2017   great toe    TOE SURGERY     UPPER GASTROINTESTINAL ENDOSCOPY  [id number]   UPPER GI ENDOSCOPY     2014    Prior to  Admission medications   Medication Sig Start Date End Date Taking? Authorizing Provider  Acetaminophen (TYLENOL PO) Take 500 mg by mouth 2 (two) times daily.    [provider]  albuterol (PROAIR HFA) 108 (90 Base) MCG/ACT inhaler Inhale 2 puffs into the lungs every 6 (six) hours as needed for wheezing or shortness of breath. 08/17/22   [place], Betty G, MD  ALPRAZolam Prudy Feeler) 0.25 MG tablet Take 1 tablet by mouth twice daily as needed for anxiety 03/14/23   [place], Betty G, MD  anastrozole (ARIMIDEX) 1 MG tablet Take 1 tablet (1 mg total) by mouth daily. 12/23/22   Rickard Patience, MD  atorvastatin (LIPITOR) 10 MG tablet Take 1 tablet (10 mg total) by mouth daily. Patient taking differently: Take 10 mg by mouth every evening. 05/05/22   [place], Betty G, MD  azelaic acid (AZELEX) 20 % cream Apply topically 2 (two) times daily. After skin is thoroughly washed and patted dry, gently but thoroughly massage a thin film of azelaic acid cream into the affected area twice daily, in the morning and evening.    [provider]  Calcium-Vitamin D-Vitamin K 500-100-40 MG-UNT-MCG CHEW Chew 1 tablet by mouth daily. chewable  [provider]  Cholecalciferol (VITAMIN D3) 125 MCG (5000 UT) CAPS Take 1 capsule by mouth daily.    [provider]  cyanocobalamin (VITAMIN B12) 500 MCG tablet Take 1,000 mcg by mouth daily.    [provider]  cyclobenzaprine (FLEXERIL) 5 MG tablet Take 1 tablet (5 mg total) by mouth at bedtime. 10/15/22   [place], Betty G, MD  dicyclomine (BENTYL) 10 MG capsule Take 1 capsule (10 mg total) by mouth 3 (three) times daily before meals. 02/07/23 05/08/23  Celso Amy, PA-C  gentamicin ointment (GARAMYCIN) 0.1 % SMARTSIG:Sparingly Topical 2-3 Times Daily 01/06/23   [provider]  levothyroxine (SYNTHROID) 25 MCG tablet TAKE 1 TABLET BY MOUTH ONCE DAILY BEFORE BREAKFAST 01/31/23   [place], Betty G, MD  loratadine (CLARITIN) 10 MG tablet Take 10 mg by  mouth daily.    [provider]  Magnesium 250 MG TABS Take 1 tablet by mouth daily.    [provider]  meclizine (ANTIVERT) 12.5 MG tablet Take 1 tablet (12.5 mg total) by mouth 3 (three) times daily as needed for dizziness. 03/31/22   [place], Betty G, MD  Multiple Vitamin (MULTIVITAMIN) tablet Take 1 tablet by mouth daily.    [provider]  pantoprazole (PROTONIX) 40 MG tablet TAKE ONE TABLET BY MOUTH ONE TIME DAILY 06/28/22   [place], Betty G, MD  PARoxetine (PAXIL) 10 MG tablet TAKE ONE TABLET BY MOUTH ONE TIME DAILY 09/13/22   [place], Betty G, MD  propranolol (INDERAL) 40 MG tablet TAKE ONE TABLET BY MOUTH TWICE A DAY 09/13/22   [place], Betty G, MD  RESTASIS 0.05 % ophthalmic emulsion Place 1 drop into both eyes 2 (two) times daily. 08/29/19   [provider]  VOLTAREN 1 % GEL 2 g as needed. 11/21/19   [provider]    Allergies as of 02/07/2023 - Review Complete 02/07/2023  Allergen Reaction Noted   Sulfa antibiotics Rash 03/30/2016   Hydrocodone Nausea Only 01/18/2018   Meloxicam Other (See Comments) 11/01/2016    Family History  Problem Relation Age of Onset   Arthritis Mother    Diabetes Mother    COPD Mother    Heart failure Mother    Stroke Father    Hypertension Father    Hyperlipidemia Father    Diabetes Father    CAD Father    Heart disease Father    Heart failure Father    Breast cancer Maternal Aunt    Cancer Sister        bladder cancer    Throat cancer Brother    Colon cancer Neg Hx    Stomach cancer Neg Hx    Esophageal cancer Neg Hx     Social History   Socioeconomic History   Marital status: Single    Spouse name: Not on file   Number of children: 0   Years of education: 12   Highest education level: Some college, no degree  Occupational History   Occupation: retired    Comment: administration  Tobacco Use   Smoking status: Former    Current packs/day: 0.00    Average packs/day: 1.5 packs/day for 25.0  years (37.5 ttl pk-yrs)    Types: Cigarettes    Start date: 03/03/1965    Quit date: 03/03/1990    Years since quitting: 33.0   Smokeless tobacco: Never  Vaping Use   Vaping status: Never Used  Substance and Sexual Activity   Alcohol use: Not Currently   Drug use:  No   Sexual activity: Never  Other Topics Concern   Not on file  Social History Narrative   Lives alone   Caffeine- tea, 1 cup   Social Determinants of Health   Financial Resource Strain: Low Risk  (03/29/2022)   Overall Financial Resource Strain (CARDIA)    Difficulty of Paying Living Expenses: Not very hard  Food Insecurity: No Food Insecurity (03/29/2022)   Hunger Vital Sign    Worried About Running Out of Food in the Last Year: Never true    Ran Out of Food in the Last Year: Never true  Transportation Needs: No Transportation Needs (03/29/2022)   PRAPARE - Administrator, Civil Service (Medical): No    Lack of Transportation (Non-Medical): No  Physical Activity: Sufficiently Active (03/29/2022)   Exercise Vital Sign    Days of Exercise per Week: 3 days    Minutes of Exercise per Session: 60 min  Stress: Stress Concern Present (03/29/2022)   [employer] of Occupational Health - Occupational Stress Questionnaire    Feeling of Stress : Very much  Social Connections: Socially Isolated (03/29/2022)   Social Connection and Isolation Panel [NHANES]    Frequency of Communication with Friends and Family: More than three times a week    Frequency of Social Gatherings with Friends and Family: Once a week    Attends Religious Services: Never    Database administrator or Organizations: No    Attends Engineer, structural: Not on file    Marital Status: Divorced  Catering manager Violence: Not on file    Review of Systems: See HPI, otherwise negative ROS  Physical Exam: BP 136/83   Pulse 88   Temp (!) 97 F (36.1 C) (Temporal)   Resp 15   Ht 5\' 3"  (1.6 m)   Wt 180 lb 12.8 oz (82 kg)   LMP   (LMP Unknown)   SpO2 98%   BMI 32.03 kg/m  General:   Alert,  pleasant and cooperative in NAD Head:  Normocephalic and atraumatic. Neck:  Supple; no masses or thyromegaly. Lungs:  Clear throughout to auscultation.    Heart:  Regular rate and rhythm. Abdomen:  Soft, nontender and nondistended. Normal bowel sounds, without guarding, and without rebound.   Neurologic:  Alert and  oriented x4;  grossly normal neurologically.  Impression/Plan: Kristina [name] is here for an endoscopy and colonoscopy to be performed for dysphagia, chronic diarrhea  Risks, benefits, limitations, and alternatives regarding  endoscopy and colonoscopy have been reviewed with the patient.  Questions have been answered.  All parties agreeable.   Lannette Donath, MD  03/29/2023, 10:22 AM

## 2023-03-29 NOTE — Anesthesia Preprocedure Evaluation (Signed)
Anesthesia Evaluation  Patient identified by MRN, date of birth, ID band Patient awake    Reviewed: Allergy & Precautions, NPO status , Patient's Chart, lab work & pertinent test results  History of Anesthesia Complications (+) PONV and history of anesthetic complications  Airway Mallampati: III  TM Distance: <3 FB Neck ROM: full    Dental  (+) Chipped, Caps   Pulmonary shortness of breath and with exertion, COPD, former smoker   Pulmonary exam normal        Cardiovascular Exercise Tolerance: Good hypertension, (-) angina Normal cardiovascular exam     Neuro/Psych  Headaches PSYCHIATRIC DISORDERS         GI/Hepatic Neg liver ROS,GERD  Controlled,,  Endo/Other  Hypothyroidism    Renal/GU negative Renal ROS  negative genitourinary   Musculoskeletal   Abdominal   Peds  Hematology negative hematology ROS (+)   Anesthesia Other Findings Past Medical History: No date: Actinic keratosis No date: Anxiety No date: Arthritis No date: Back pain No date: Breast cancer (HCC) No date: Cancer (HCC)     Comment:  basal and squamous cell skin No date: Chicken pox No date: Chronic back pain No date: Complication of anesthesia     Comment:  hard to wake up after lap cho-had to stay overnight-bp               and hr dropped No date: COPD (chronic obstructive pulmonary disease) (HCC) No date: Depression No date: Dyspnea     Comment:  due to copd No date: Gallbladder problem No date: GERD (gastroesophageal reflux disease) No date: Goiter No date: Hypertension No date: Hypothyroidism No date: Joint pain No date: Lactose intolerance No date: Migraines No date: Osteoarthritis No date: PONV (postoperative nausea and vomiting)     Comment:  very nauseated No date: SOB (shortness of breath) 2009: 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, Mohs No date: Thyroid disease No date: Thyroid nodule No date: Tremor No  date: Vitamin D deficiency  Past Surgical History: 1978: ABDOMINAL HYSTERECTOMY 05/24/2022: BREAST LUMPECTOMY; Right 05/24/2022: BREAST LUMPECTOMY,RADIO FREQ LOCALIZER,AXILLARY SENTINEL  LYMPH NODE BIOPSY; Right     Comment:  Procedure: BREAST LUMPECTOMY,RADIO FREQ               LOCALIZER,AXILLARY SENTINEL LYMPH NODE BIOPSY;  Surgeon:               Campbell Lerner, MD;  Location: [facility] ORS;  Service:               General;  Laterality: Right; 12/20/2017: CATARACT EXTRACTION; Left     Comment:  will have the right one completed a month later  No date: CATARACT EXTRACTION; Right 2000: CHOLECYSTECTOMY 2007: COLONOSCOPY No date: COLONOSCOPY     Comment:  2018 08/19/2017: JOINT REPLACEMENT; Right     Comment:  great toe  No date: TOE SURGERY 2007,2014: UPPER GASTROINTESTINAL ENDOSCOPY No date: UPPER GI ENDOSCOPY     Comment:  2014     Reproductive/Obstetrics negative OB ROS                             Anesthesia Physical Anesthesia Plan  ASA: 3  Anesthesia Plan: General   Post-op Pain Management:    Induction: Intravenous  PONV Risk Score and Plan: Propofol infusion and TIVA  Airway Management Planned: Natural Airway and Nasal Cannula  Additional Equipment:   Intra-op Plan:   Post-operative Plan:  Informed Consent: I have reviewed the patients History and Physical, chart, labs and discussed the procedure including the risks, benefits and alternatives for the proposed anesthesia with the patient or authorized representative who has indicated his/her understanding and acceptance.     Dental Advisory Given  Plan Discussed with: Anesthesiologist, CRNA and Surgeon  Anesthesia Plan Comments: (Patient consented for risks of anesthesia including but not limited to:  - adverse reactions to medications - risk of airway placement if required - damage to eyes, teeth, lips or other oral mucosa - nerve damage due to positioning  - sore throat or  hoarseness - Damage to heart, brain, nerves, lungs, other parts of body or loss of life  Patient voiced understanding.)       Anesthesia Quick Evaluation

## 2023-03-29 NOTE — Transfer of Care (Signed)
Immediate Anesthesia Transfer of Care Note  Patient: Kristina [name]  Procedure(s) Performed: COLONOSCOPY WITH PROPOFOL ESOPHAGOGASTRODUODENOSCOPY (EGD) BIOPSY POLYPECTOMY  Patient Location: PACU  Anesthesia Type:MAC  Level of Consciousness: awake  Airway & Oxygen Therapy: Patient Spontanous Breathing  Post-op Assessment: Report given to RN and Post -op Vital signs reviewed and stable  Post vital signs: Reviewed and stable  Last Vitals:  Vitals Value Taken Time  BP 113/58 03/29/23 0939  Temp    Pulse 90 03/29/23 0939  Resp 17 03/29/23 0939  SpO2 97 % 03/29/23 0939    Last Pain:  Vitals:   03/29/23 0939  TempSrc:   PainSc: 0-No pain         Complications: No notable events documented.

## 2023-03-29 NOTE — Op Note (Signed)
[facility] Gastroenterology Patient Name: Kristina [name] Procedure Date: 03/29/2023 8:48 AM MRN: [id number] Account #: [id number] Date of Birth: Aug 03, 1951 Admit Type: Outpatient Age: 72 Room: [name] ENDO ROOM 3 Gender: Female Note Status: Finalized Instrument Name: Upper Endoscope [id number] Procedure:             Upper GI endoscopy Indications:           Esophageal dysphagia, Diarrhea Providers:             Toney Reil MD, MD Referring MD:          Betty G. [place] (Referring MD) Medicines:             General Anesthesia Complications:         No immediate complications. Estimated blood loss: None. Procedure:             Pre-Anesthesia Assessment:                        - Prior to the procedure, a History and Physical was                         performed, and patient medications and allergies were                         reviewed. The patient is competent. The risks and                         benefits of the procedure and the sedation options and                         risks were discussed with the patient. All questions                         were answered and informed consent was obtained.                         Patient identification and proposed procedure were                         verified by the physician, the nurse, the                         anesthesiologist, the anesthetist and the technician                         in the pre-procedure area in the procedure room in the                         endoscopy suite. Mental Status Examination: alert and                         oriented. Airway Examination: normal oropharyngeal                         airway and neck mobility. Respiratory Examination:                         clear to auscultation. CV Examination: normal.  Prophylactic Antibiotics: The patient does not require                         prophylactic antibiotics. Prior Anticoagulants: The                          patient has taken no anticoagulant or antiplatelet                         agents. ASA Grade Assessment: III - A patient with                         severe systemic disease. After reviewing the risks and                         benefits, the patient was deemed in satisfactory                         condition to undergo the procedure. The anesthesia                         plan was to use general anesthesia. Immediately prior                         to administration of medications, the patient was                         re-assessed for adequacy to receive sedatives. The                         heart rate, respiratory rate, oxygen saturations,                         blood pressure, adequacy of pulmonary ventilation, and                         response to care were monitored throughout the                         procedure. The physical status of the patient was                         re-assessed after the procedure.                        After obtaining informed consent, the endoscope was                         passed under direct vision. Throughout the procedure,                         the patient's blood pressure, pulse, and oxygen                         saturations were monitored continuously. The Endoscope                         was introduced through the mouth, and advanced to the  second part of duodenum. The upper GI endoscopy was                         accomplished without difficulty. The patient tolerated                         the procedure well. Findings:      The duodenal bulb, second portion of the duodenum and third portion of       the duodenum were normal. Biopsies for histology were taken with a cold       forceps for evaluation of celiac disease.      Diffuse moderately erythematous mucosa without bleeding was found in the       gastric antrum. Biopsies were taken with a cold forceps for Helicobacter       pylori testing.      The  gastric body and incisura were normal. Biopsies were taken with a       cold forceps for Helicobacter pylori testing.      The cardia and gastric fundus were normal on retroflexion.      Esophagogastric landmarks were identified: the gastroesophageal junction       was found at 38 cm from the incisors.      The gastroesophageal junction and examined esophagus were normal.       Biopsies were taken with a cold forceps for histology. Impression:            - Normal duodenal bulb, second portion of the duodenum                         and third portion of the duodenum. Biopsied.                        - Erythematous mucosa in the antrum. Biopsied.                        - Normal gastric body and incisura. Biopsied.                        - Esophagogastric landmarks identified.                        - Normal gastroesophageal junction and esophagus.                         Biopsied. Recommendation:        - Await pathology results.                        - Follow an antireflux regimen.                        - Use Protonix (pantoprazole) 40 mg PO BID for 3                         months.                        - Proceed with colonoscopy as scheduled                        See colonoscopy report Procedure Code(s):     ---  Professional ---                        [phone], Esophagogastroduodenoscopy, flexible,                         transoral; with biopsy, single or multiple Diagnosis Code(s):     --- Professional ---                        K31.89, Other diseases of stomach and duodenum                        R13.14, Dysphagia, pharyngoesophageal phase                        R19.7, Diarrhea, unspecified CPT copyright 2022 American Medical Association. All rights reserved. The codes documented in this report are preliminary and upon coder review may  be revised to meet current compliance requirements. Dr. Libby Maw Toney Reil MD, MD 03/29/2023 9:10:55 AM This report has been signed  electronically. Number of Addenda: 0 Note Initiated On: 03/29/2023 8:48 AM Estimated Blood Loss:  Estimated blood loss: none.      [facility]

## 2023-03-29 NOTE — Op Note (Signed)
[facility] Gastroenterology Patient Name: Kristina [name] Procedure Date: 03/29/2023 8:48 AM MRN: [id number] Account #: [id number] Date of Birth: 1950-09-12 Admit Type: Outpatient Age: 72 Room: [facility] ENDO ROOM 3 Gender: Female Note Status: Finalized Instrument Name: Colonoscope [id number] Procedure:             Colonoscopy Indications:           Last colonoscopy: August 2018, Chronic diarrhea Providers:             Toney Reil MD, MD Referring MD:          Betty G. [place] (Referring MD) Medicines:             General Anesthesia Complications:         No immediate complications. Estimated blood loss: None. Procedure:             Pre-Anesthesia Assessment:                        - Prior to the procedure, a History and Physical was                         performed, and patient medications and allergies were                         reviewed. The patient is competent. The risks and                         benefits of the procedure and the sedation options and                         risks were discussed with the patient. All questions                         were answered and informed consent was obtained.                         Patient identification and proposed procedure were                         verified by the physician, the nurse, the                         anesthesiologist, the anesthetist and the technician                         in the pre-procedure area in the procedure room in the                         endoscopy suite. Mental Status Examination: alert and                         oriented. Airway Examination: normal oropharyngeal                         airway and neck mobility. Respiratory Examination:                         clear to auscultation. CV Examination: normal.  Prophylactic Antibiotics: The patient does not require                         prophylactic antibiotics. Prior Anticoagulants: The                          patient has taken no anticoagulant or antiplatelet                         agents. ASA Grade Assessment: III - A patient with                         severe systemic disease. After reviewing the risks and                         benefits, the patient was deemed in satisfactory                         condition to undergo the procedure. The anesthesia                         plan was to use general anesthesia. Immediately prior                         to administration of medications, the patient was                         re-assessed for adequacy to receive sedatives. The                         heart rate, respiratory rate, oxygen saturations,                         blood pressure, adequacy of pulmonary ventilation, and                         response to care were monitored throughout the                         procedure. The physical status of the patient was                         re-assessed after the procedure.                        After obtaining informed consent, the colonoscope was                         passed under direct vision. Throughout the procedure,                         the patient's blood pressure, pulse, and oxygen                         saturations were monitored continuously. The                         Colonoscope was introduced through the anus and  advanced to the the terminal ileum, with                         identification of the appendiceal orifice and IC                         valve. The colonoscopy was performed without                         difficulty. The patient tolerated the procedure well.                         The quality of the bowel preparation was evaluated                         using the BBPS [facility] Bowel Preparation Scale) with                         scores of: Right Colon = 3, Transverse Colon = 3 and                         Left Colon = 3 (entire mucosa seen well with no                         residual  staining, small fragments of stool or opaque                         liquid). The total BBPS score equals 9. The terminal                         ileum, ileocecal valve, appendiceal orifice, and                         rectum were photographed. Findings:      The perianal and digital rectal examinations were normal. Pertinent       negatives include normal sphincter tone and no palpable rectal lesions.      The terminal ileum appeared normal.      Normal mucosa was found in the entire colon. Biopsies for histology were       taken with a cold forceps from the entire colon for evaluation of       microscopic colitis.      A 20 mm polyp was found in the proximal rectum. The polyp was       pedunculated. The polyp was removed with a hot snare. Resection and       retrieval were complete. Estimated blood loss: none.      The retroflexed view of the distal rectum and anal verge was normal and       showed no anal or rectal abnormalities. Impression:            - The examined portion of the ileum was normal.                        - Normal mucosa in the entire examined colon. Biopsied.                        - One 20 mm polyp in the  proximal rectum, removed with                         a hot snare. Resected and retrieved.                        - The distal rectum and anal verge are normal on                         retroflexion view. Recommendation:        - Discharge patient to home (with escort).                        - Resume previous diet today.                        - Continue present medications.                        - Await pathology results.                        - Repeat colonoscopy in 3 - 5 years for surveillance                         based on pathology results. Procedure Code(s):     --- Professional ---                        [phone], Colonoscopy, flexible; with removal of                         tumor(s), polyp(s), or other lesion(s) by snare                          technique                        45380, 59, Colonoscopy, flexible; with biopsy, single                         or multiple Diagnosis Code(s):     --- Professional ---                        D12.8, Benign neoplasm of rectum                        K52.9, Noninfective gastroenteritis and colitis,                         unspecified CPT copyright 2022 American Medical Association. All rights reserved. The codes documented in this report are preliminary and upon coder review may  be revised to meet current compliance requirements. Dr. Libby Maw Toney Reil MD, MD 03/29/2023 9:35:44 AM This report has been signed electronically. Number of Addenda: 0 Note Initiated On: 03/29/2023 8:48 AM Scope Withdrawal Time: 0 hours 18 minutes 1 second  Total Procedure Duration: 0 hours 21 minutes 34 seconds  Estimated Blood Loss:  Estimated blood loss: none.      [facility]

## 2023-03-29 NOTE — Anesthesia Postprocedure Evaluation (Signed)
Anesthesia Post Note  Patient: Kristina [name]  Procedure(s) Performed: COLONOSCOPY WITH PROPOFOL ESOPHAGOGASTRODUODENOSCOPY (EGD) BIOPSY POLYPECTOMY  Patient location during evaluation: Endoscopy Anesthesia Type: General Level of consciousness: awake and alert Pain management: pain level controlled Vital Signs Assessment: post-procedure vital signs reviewed and stable Respiratory status: spontaneous breathing, nonlabored ventilation, respiratory function stable and patient connected to nasal cannula oxygen Cardiovascular status: blood pressure returned to baseline and stable Postop Assessment: no apparent nausea or vomiting Anesthetic complications: no   No notable events documented.   Last Vitals:  Vitals:   03/29/23 0939 03/29/23 0949  BP: (!) 113/58 136/83  Pulse: 90 88  Resp: 17 15  Temp:    SpO2: 97% 98%    Last Pain:  Vitals:   03/29/23 0949  TempSrc:   PainSc: 0-No pain                 Cleda Mccreedy Jahkeem Kurka

## 2023-03-30 ENCOUNTER — Encounter: Payer: Self-pay | Admitting: Gastroenterology

## 2023-04-04 ENCOUNTER — Telehealth: Payer: Self-pay

## 2023-04-04 DIAGNOSIS — R197 Diarrhea, unspecified: Secondary | ICD-10-CM | POA: Diagnosis not present

## 2023-04-04 MED ORDER — RIFAXIMIN 550 MG PO TABS
550.0000 mg | ORAL_TABLET | Freq: Three times a day (TID) | ORAL | 0 refills | Status: AC
Start: 2023-04-04 — End: 2023-04-18

## 2023-04-04 NOTE — Telephone Encounter (Signed)
Pa has been approved by insurance company good from 04/04/2023 to 04/17/2023

## 2023-04-04 NOTE — Telephone Encounter (Signed)
Submitted pa through cover my meds for Xifaxan. Waiting on response from insurance company.

## 2023-04-04 NOTE — Telephone Encounter (Signed)
-----   Message from [facility] sent at 03/30/2023  2:33 PM EDT ----- Please inform patient that the pathology results from upper endoscopy came back normal.  Biopsies from colonoscopy also does not show any inflammation.  With regards to her symptoms of diarrhea, recommend trial of Xifaxan 550 mg 3 times daily for 2 weeks for IBS diarrhea Check food allergy profile, alpha gal panel  Follow-up with Celso Amy as scheduled  Rohini Vanga

## 2023-04-04 NOTE — Telephone Encounter (Signed)
Sent medication to the pharmacy. Patient verbalized understanding and will come for lab work this week

## 2023-04-08 LAB — ALPHA-GAL PANEL
Allergen Lamb IgE: <0.1 kU/L
Beef IgE: <0.1 kU/L
IgE (Immunoglobulin E), Serum: 39 [IU]/mL (ref 6–495)
O215-IgE Alpha-Gal: <0.1 kU/L
Pork IgE: <0.1 kU/L

## 2023-04-08 LAB — FOOD ALLERGY PROFILE
Allergen Corn, IgE: <0.1 kU/L
Clam IgE: <0.1 kU/L
Codfish IgE: <0.1 kU/L
Egg White IgE: <0.1 kU/L
Milk IgE: <0.1 kU/L
Peanut IgE: <0.1 kU/L
Scallop IgE: <0.1 kU/L
Sesame Seed IgE: <0.1 kU/L
Shrimp IgE: <0.1 kU/L
Soybean IgE: <0.1 kU/L
Walnut IgE: <0.1 kU/L
Wheat IgE: <0.1 kU/L

## 2023-04-12 ENCOUNTER — Other Ambulatory Visit: Payer: Self-pay

## 2023-04-12 ENCOUNTER — Encounter: Payer: Self-pay | Admitting: Surgery

## 2023-04-12 ENCOUNTER — Encounter: Payer: Self-pay | Admitting: Physician Assistant

## 2023-04-12 DIAGNOSIS — Z170 Estrogen receptor positive status [ER+]: Secondary | ICD-10-CM

## 2023-04-12 DIAGNOSIS — K219 Gastro-esophageal reflux disease without esophagitis: Secondary | ICD-10-CM

## 2023-04-12 MED ORDER — PANTOPRAZOLE SODIUM 40 MG PO TBEC
40.0000 mg | DELAYED_RELEASE_TABLET | Freq: Two times a day (BID) | ORAL | 0 refills | Status: DC
Start: 2023-04-12 — End: 2023-10-14

## 2023-04-12 NOTE — Telephone Encounter (Signed)
Per EGD report Increase for 3 months EGD was 03/29/2023

## 2023-04-19 ENCOUNTER — Other Ambulatory Visit: Payer: Self-pay | Admitting: Surgery

## 2023-04-19 ENCOUNTER — Encounter: Payer: Self-pay | Admitting: Family Medicine

## 2023-04-19 DIAGNOSIS — C50211 Malignant neoplasm of upper-inner quadrant of right female breast: Secondary | ICD-10-CM

## 2023-04-20 ENCOUNTER — Ambulatory Visit
Admission: RE | Admit: 2023-04-20 | Discharge: 2023-04-20 | Disposition: A | Discharge status: Home / Self Care | Payer: PPO | Source: Ambulatory Visit | Attending: Surgery | Admitting: Surgery

## 2023-04-20 DIAGNOSIS — N644 Mastodynia: Secondary | ICD-10-CM | POA: Diagnosis not present

## 2023-04-20 DIAGNOSIS — C50211 Malignant neoplasm of upper-inner quadrant of right female breast: Secondary | ICD-10-CM | POA: Insufficient documentation

## 2023-04-20 DIAGNOSIS — C50919 Malignant neoplasm of unspecified site of unspecified female breast: Secondary | ICD-10-CM | POA: Diagnosis not present

## 2023-04-20 DIAGNOSIS — Z170 Estrogen receptor positive status [ER+]: Secondary | ICD-10-CM | POA: Insufficient documentation

## 2023-04-20 DIAGNOSIS — R92323 Mammographic fibroglandular density, bilateral breasts: Secondary | ICD-10-CM | POA: Diagnosis not present

## 2023-04-21 ENCOUNTER — Encounter: Payer: Self-pay | Admitting: Radiation Oncology

## 2023-04-21 ENCOUNTER — Ambulatory Visit
Admission: RE | Admit: 2023-04-21 | Discharge: 2023-04-21 | Disposition: A | Discharge status: Home / Self Care | Payer: PPO | Source: Ambulatory Visit | Attending: Radiation Oncology | Admitting: Radiation Oncology

## 2023-04-21 VITALS — BP 126/67 | HR 69 | Temp 98.7°F | Resp 14 | Wt 186.0 lb

## 2023-04-21 DIAGNOSIS — Z170 Estrogen receptor positive status [ER+]: Secondary | ICD-10-CM | POA: Insufficient documentation

## 2023-04-21 DIAGNOSIS — Z923 Personal history of irradiation: Secondary | ICD-10-CM | POA: Insufficient documentation

## 2023-04-21 DIAGNOSIS — Z79811 Long term (current) use of aromatase inhibitors: Secondary | ICD-10-CM | POA: Insufficient documentation

## 2023-04-21 DIAGNOSIS — C50211 Malignant neoplasm of upper-inner quadrant of right female breast: Secondary | ICD-10-CM | POA: Insufficient documentation

## 2023-04-21 NOTE — Progress Notes (Signed)
Radiation Oncology Follow up Note  Name: Kristina [name]   Date:   04/21/2023 MRN:  [id number] DOB: [birth year]    This 72 y.o. female presents to the clinic today for 85-month follow-up status post whole breast radiation to her right breast for stage Ia ER/PR positive invasive mammary carcinoma.  REFERRING PROVIDER: [place], Betty G, MD  HPI: Patient is a 72 year old female now out 7 months having completed whole breast radiation to her right breast for stage Ia ER/PR positive invasive mammary carcinoma.  Seen today in routine follow-up she is doing well.  Still has some tenderness and occasional pain in her right breast most likely related to scarring.  She otherwise specifically denies cough or bone pain..  She is currently on Arimidex tolerating it well without side effect.  She had a mammogram yesterday which I reviewed was BI-RADS 2 benign.  COMPLICATIONS OF TREATMENT: none  FOLLOW UP COMPLIANCE: keeps appointments   PHYSICAL EXAM:  BP 126/67 (BP Location: Left Arm, Patient Position: Sitting)   Pulse 69   Temp 98.7 F (37.1 C) (Tympanic)   Resp 14   Wt 186 lb (84.4 kg)   LMP  (LMP Unknown)   BMI 32.95 kg/m  Lungs are clear to A&P cardiac examination essentially unremarkable with regular rate and rhythm. No dominant mass or nodularity is noted in either breast in 2 positions examined. Incision is well-healed. No axillary or supraclavicular adenopathy is appreciated. Cosmetic result is excellent.  Well-developed well-nourished patient in NAD. HEENT reveals PERLA, EOMI, discs not visualized.  Oral cavity is clear. No oral mucosal lesions are identified. Neck is clear without evidence of cervical or supraclavicular adenopathy. Lungs are clear to A&P. Cardiac examination is essentially unremarkable with regular rate and rhythm without murmur rub or thrill. Abdomen is benign with no organomegaly or masses noted. Motor sensory and DTR levels are equal and symmetric in the upper and lower  extremities. Cranial nerves II through XII are grossly intact. Proprioception is intact. No peripheral adenopathy or edema is identified. No motor or sensory levels are noted. Crude visual fields are within normal range.  RADIOLOGY RESULTS: Mammograms reviewed compatible with above-stated findings  PLAN: Present time patient is now at 7 months with no evidence of disease she is doing well.  She is tolerating endocrine therapy well.  Pleased with her overall progress.  Of asked to see her back in 6 months and then will start once year follow-up visits.  Patient knows to call with any concerns.  I would like to take this opportunity to thank you for allowing me to participate in the care of your patient.Carmina Miller, MD

## 2023-04-22 ENCOUNTER — Other Ambulatory Visit: Payer: Self-pay

## 2023-04-25 ENCOUNTER — Ambulatory Visit: Payer: PPO | Admitting: Physician Assistant

## 2023-04-25 ENCOUNTER — Encounter: Payer: Self-pay | Admitting: Physician Assistant

## 2023-04-25 VITALS — BP 125/70 | HR 72 | Temp 98.5°F | Ht 62.0 in | Wt 187.8 lb

## 2023-04-25 DIAGNOSIS — K219 Gastro-esophageal reflux disease without esophagitis: Secondary | ICD-10-CM

## 2023-04-25 DIAGNOSIS — K2950 Unspecified chronic gastritis without bleeding: Secondary | ICD-10-CM

## 2023-04-25 DIAGNOSIS — K9089 Other intestinal malabsorption: Secondary | ICD-10-CM

## 2023-04-25 DIAGNOSIS — K580 Irritable bowel syndrome with diarrhea: Secondary | ICD-10-CM

## 2023-04-25 MED ORDER — CHOLESTYRAMINE 4 G PO PACK
4.0000 g | PACK | Freq: Every day | ORAL | 3 refills | Status: DC
Start: 2023-04-25 — End: 2023-06-27

## 2023-04-25 NOTE — Progress Notes (Signed)
Celso Amy, PA-C [address]  [address]  [place], [place] [address]  Main: [phone]  Fax: [phone]   Primary Care Physician: [place], Betty G, MD  Primary Gastroenterologist:  Celso Amy, PA-C / Dr. Lannette Donath    CC: Follow-up diarrhea  HPI: Kristina [name] is a 72 y.o. female returns for 75-month follow-up of diarrhea.  She has episodes of intermittent diarrhea for 20 years thought secondary to irritable bowel syndrome.  Stool studies and Labs 02/2023: showed negative GI pathogen panel, negative C. difficile, normal fecal pancreatic elastase, normal fecal calprotectin.  Normal TSH, BMP, and CBC.  She had recent negative alpha gal and food allergy profile.  Colonoscopy 03/29/2023 by Dr. Allegra Lai showed good prep, 20 mm tubular adenoma polyp removed from proximal rectum, otherwise normal.  No dysplasia.  Biopsies negative for microscopic colitis.  5-year repeat.  EGD 03/29/2023 showed moderate gastritis, normal duodenum and esophagus.  Biopsies negative for celiac, H. pylori, and EOE.  Gastritis was treated with Protonix 40 Mg twice daily for 3 months.  Dr. Allegra Lai recommended trial of Xifaxan for IBS-D.  Also tried dicyclomine 10 Mg 3 times daily.  Cephalexin was cost prohibitive.  She is not taking dicyclomine because she is not having any abdominal cramping.  She continues to have chronic diarrhea with 3 bowel movements daily.  She has taken OTC Imodium which helps.  Currently taking Benefiber daily with little benefit.   Current Outpatient Medications  Medication Sig Dispense Refill   Acetaminophen (TYLENOL PO) Take 500 mg by mouth 2 (two) times daily.     albuterol (PROAIR HFA) 108 (90 Base) MCG/ACT inhaler Inhale 2 puffs into the lungs every 6 (six) hours as needed for wheezing or shortness of breath. 32 g 3   ALPRAZolam (XANAX) 0.25 MG tablet Take 1 tablet by mouth twice daily as needed for anxiety 45 tablet 1   anastrozole (ARIMIDEX) 1 MG tablet Take 1 tablet (1 mg  total) by mouth daily. 100 tablet 1   atorvastatin (LIPITOR) 10 MG tablet Take 1 tablet (10 mg total) by mouth daily. (Patient taking differently: Take 10 mg by mouth every evening.) 90 tablet 3   azelaic acid (AZELEX) 20 % cream Apply topically 2 (two) times daily. After skin is thoroughly washed and patted dry, gently but thoroughly massage a thin film of azelaic acid cream into the affected area twice daily, in the morning and evening.     Calcium-Vitamin D-Vitamin K 500-100-40 MG-UNT-MCG CHEW Chew 1 tablet by mouth daily. chewable     Cholecalciferol (VITAMIN D3) 125 MCG (5000 UT) CAPS Take 1 capsule by mouth daily.     cyanocobalamin (VITAMIN B12) 500 MCG tablet Take 1,000 mcg by mouth daily.     cyclobenzaprine (FLEXERIL) 5 MG tablet Take 1 tablet (5 mg total) by mouth at bedtime. 90 tablet 1   levothyroxine (SYNTHROID) 25 MCG tablet TAKE 1 TABLET BY MOUTH ONCE DAILY BEFORE BREAKFAST 90 tablet 1   loratadine (CLARITIN) 10 MG tablet Take 10 mg by mouth daily.     Magnesium 250 MG TABS Take 1 tablet by mouth daily.     meclizine (ANTIVERT) 12.5 MG tablet Take 1 tablet (12.5 mg total) by mouth 3 (three) times daily as needed for dizziness. 60 tablet 1   Multiple Vitamin (MULTIVITAMIN) tablet Take 1 tablet by mouth daily.     pantoprazole (PROTONIX) 40 MG tablet Take 1 tablet (40 mg total) by mouth 2 (two) times daily before a  meal. 180 tablet 0   PARoxetine (PAXIL) 10 MG tablet TAKE ONE TABLET BY MOUTH ONE TIME DAILY 90 tablet 2   propranolol (INDERAL) 40 MG tablet TAKE ONE TABLET BY MOUTH TWICE A DAY 180 tablet 2   RESTASIS 0.05 % ophthalmic emulsion Place 1 drop into both eyes 2 (two) times daily.     VOLTAREN 1 % GEL 2 g as needed.     No current facility-administered medications for this visit.    Allergies as of 04/25/2023 - Review Complete 04/25/2023  Allergen Reaction Noted   Sulfa antibiotics Rash 03/30/2016   Hydrocodone Nausea Only 01/18/2018   Meloxicam Other (See Comments)  11/01/2016    Past Medical History:  Diagnosis Date   Actinic keratosis    Anxiety    Arthritis    Back pain    Breast cancer (HCC)    Cancer (HCC)    basal and squamous cell skin   Chicken pox    Chronic back pain    Complication of anesthesia    hard to wake up after lap cho-had to stay overnight-bp and hr dropped   COPD (chronic obstructive pulmonary disease) (HCC)    Depression    Dyspnea    due to copd   Gallbladder problem    GERD (gastroesophageal reflux disease)    Goiter    Hypertension    Hypothyroidism    Joint pain    Lactose intolerance    Migraines    Osteoarthritis    PONV (postoperative nausea and vomiting)    very nauseated   SOB (shortness of breath)    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, Mohs   Thyroid disease    Thyroid nodule    Tremor    Vitamin D deficiency     Past Surgical History:  Procedure Laterality Date   ABDOMINAL HYSTERECTOMY  1978   BIOPSY  03/29/2023   Procedure: BIOPSY;  Surgeon: Toney Reil, MD;  Location: [facility] ENDOSCOPY;  Service: Gastroenterology;;   BREAST LUMPECTOMY Right 05/24/2022   BREAST LUMPECTOMY,RADIO FREQ LOCALIZER,AXILLARY SENTINEL LYMPH NODE BIOPSY Right 05/24/2022   Procedure: BREAST LUMPECTOMY,RADIO FREQ LOCALIZER,AXILLARY SENTINEL LYMPH NODE BIOPSY;  Surgeon: Campbell Lerner, MD;  Location: [facility] ORS;  Service: General;  Laterality: Right;   CATARACT EXTRACTION Left 12/20/2017   will have the right one completed a month later    CATARACT EXTRACTION Right    CHOLECYSTECTOMY  2000   COLONOSCOPY  2007   COLONOSCOPY     2018   COLONOSCOPY WITH PROPOFOL N/A 03/29/2023   Procedure: COLONOSCOPY WITH PROPOFOL;  Surgeon: Toney Reil, MD;  Location: [facility] ENDOSCOPY;  Service: Gastroenterology;  Laterality: N/A;   ESOPHAGOGASTRODUODENOSCOPY N/A 03/29/2023   Procedure: ESOPHAGOGASTRODUODENOSCOPY (EGD);  Surgeon: Toney Reil, MD;  Location: [facility] ENDOSCOPY;  Service: Gastroenterology;   Laterality: N/A;   JOINT REPLACEMENT Right 08/19/2017   great toe    POLYPECTOMY  03/29/2023   Procedure: POLYPECTOMY;  Surgeon: Toney Reil, MD;  Location: [facility] ENDOSCOPY;  Service: Gastroenterology;;   TOE SURGERY     UPPER GASTROINTESTINAL ENDOSCOPY  [phone]   UPPER GI ENDOSCOPY     2014    Review of Systems:    All systems reviewed and negative except where noted in HPI.   Physical Examination:   BP 125/70   Pulse 72   Temp 98.5 F (36.9 C)   Ht 5\' 2"  (1.575 m)   Wt 187 lb 12.8 oz (85.2 kg)   LMP  (  LMP Unknown)   BMI 34.35 kg/m   General: Well-nourished, well-developed in no acute distress.  Neuro: Alert and oriented x 3.  Grossly intact.  Psych: Alert and cooperative, normal mood and affect. No exam performed.   Imaging Studies: See HPI.   Assessment and Plan:   Kristina [name] is a 72 y.o. y/o female returns for follow-up of chronic diarrhea and GERD.  Recent EGD showed chronic gastritis, and biopsies negative for H. pylori and celiac.  Recent colonoscopy showed a large 20 mm tubular adenoma polyp removed from rectum, otherwise normal.  Biopsies negative for microscopic colitis.  1.  Irritable bowel syndrome, diarrhea predominant  Low FODMAP diet  Start FiberCon tablets 2 caplets twice daily with large glass of water.  Take OTC Imodium as needed  2.  Bile salt diarrhea postcholecystectomy  Start Rx cholestyramine powder mix half to 1 packet in a drink once daily to help control diarrhea.  3.  GERD and Chronic Gastritis; H. Pylori NEGATIVE.  Continue pantoprazole 40 Mg twice daily for 3 months.  After that decrease pantoprazole to 40 mg once daily dose long-term.  Take OTC Pepcid 20 Mg twice dail, and Tums as needed.  Recommend Lifestyle Modifications to prevent Acid Reflux.  Rec. Avoid coffee, sodas, peppermint, citrus fruits, and spicey foods.  Avoid eating 2-3 hours before bedtime.   4.  History of adenomatous colon polyp  Plan for 5-year repeat  colonoscopy (due 03/2028).   Celso Amy, PA-C  Follow up in 1 year or sooner if needed if recurrent or worsening GI symptoms.

## 2023-05-09 DIAGNOSIS — Z961 Presence of intraocular lens: Secondary | ICD-10-CM | POA: Diagnosis not present

## 2023-05-11 ENCOUNTER — Other Ambulatory Visit: Payer: Self-pay

## 2023-05-11 DIAGNOSIS — E7849 Other hyperlipidemia: Secondary | ICD-10-CM

## 2023-05-11 MED ORDER — ATORVASTATIN CALCIUM 10 MG PO TABS
10.0000 mg | ORAL_TABLET | Freq: Every day | ORAL | 2 refills | Status: DC
Start: 2023-05-11 — End: 2023-10-25

## 2023-05-19 ENCOUNTER — Other Ambulatory Visit: Payer: Self-pay | Admitting: Medical Genetics

## 2023-05-19 DIAGNOSIS — Z006 Encounter for examination for normal comparison and control in clinical research program: Secondary | ICD-10-CM

## 2023-05-25 ENCOUNTER — Other Ambulatory Visit: Payer: Self-pay | Admitting: *Deleted

## 2023-05-25 MED ORDER — ANASTROZOLE 1 MG PO TABS: 1.0000 mg | ORAL_TABLET | Freq: Every day | ORAL | 1 refills | Status: DC

## 2023-05-26 ENCOUNTER — Other Ambulatory Visit: Payer: Self-pay

## 2023-05-26 MED ORDER — ANASTROZOLE 1 MG PO TABS: 1.0000 mg | ORAL_TABLET | Freq: Every day | ORAL | 1 refills | Status: DC

## 2023-06-08 ENCOUNTER — Other Ambulatory Visit: Payer: Self-pay | Admitting: Family Medicine

## 2023-06-08 DIAGNOSIS — G4700 Insomnia, unspecified: Secondary | ICD-10-CM

## 2023-06-13 ENCOUNTER — Other Ambulatory Visit: Payer: Self-pay | Admitting: Family Medicine

## 2023-06-13 DIAGNOSIS — F418 Other specified anxiety disorders: Secondary | ICD-10-CM

## 2023-06-13 DIAGNOSIS — I10 Essential (primary) hypertension: Secondary | ICD-10-CM

## 2023-06-27 ENCOUNTER — Inpatient Hospital Stay (HOSPITAL_BASED_OUTPATIENT_CLINIC_OR_DEPARTMENT_OTHER): Payer: PPO | Admitting: Oncology

## 2023-06-27 ENCOUNTER — Inpatient Hospital Stay: Payer: PPO | Attending: Oncology

## 2023-06-27 ENCOUNTER — Encounter: Payer: Self-pay | Admitting: Oncology

## 2023-06-27 VITALS — BP 136/85 | HR 79 | Temp 98.2°F | Resp 18 | Wt 187.2 lb

## 2023-06-27 DIAGNOSIS — Z923 Personal history of irradiation: Secondary | ICD-10-CM | POA: Diagnosis not present

## 2023-06-27 DIAGNOSIS — Z90710 Acquired absence of both cervix and uterus: Secondary | ICD-10-CM | POA: Insufficient documentation

## 2023-06-27 DIAGNOSIS — Z170 Estrogen receptor positive status [ER+]: Secondary | ICD-10-CM

## 2023-06-27 DIAGNOSIS — Z87891 Personal history of nicotine dependence: Secondary | ICD-10-CM | POA: Insufficient documentation

## 2023-06-27 DIAGNOSIS — Z1721 Progesterone receptor positive status: Secondary | ICD-10-CM | POA: Diagnosis not present

## 2023-06-27 DIAGNOSIS — C50211 Malignant neoplasm of upper-inner quadrant of right female breast: Secondary | ICD-10-CM | POA: Diagnosis not present

## 2023-06-27 DIAGNOSIS — M2550 Pain in unspecified joint: Secondary | ICD-10-CM

## 2023-06-27 DIAGNOSIS — C50911 Malignant neoplasm of unspecified site of right female breast: Secondary | ICD-10-CM | POA: Insufficient documentation

## 2023-06-27 DIAGNOSIS — M8580 Other specified disorders of bone density and structure, unspecified site: Secondary | ICD-10-CM | POA: Diagnosis not present

## 2023-06-27 DIAGNOSIS — Z8052 Family history of malignant neoplasm of bladder: Secondary | ICD-10-CM | POA: Diagnosis not present

## 2023-06-27 DIAGNOSIS — Z79811 Long term (current) use of aromatase inhibitors: Secondary | ICD-10-CM | POA: Diagnosis not present

## 2023-06-27 DIAGNOSIS — Z1732 Human epidermal growth factor receptor 2 negative status: Secondary | ICD-10-CM | POA: Insufficient documentation

## 2023-06-27 DIAGNOSIS — Z803 Family history of malignant neoplasm of breast: Secondary | ICD-10-CM | POA: Diagnosis not present

## 2023-06-27 DIAGNOSIS — K589 Irritable bowel syndrome without diarrhea: Secondary | ICD-10-CM | POA: Insufficient documentation

## 2023-06-27 DIAGNOSIS — R7989 Other specified abnormal findings of blood chemistry: Secondary | ICD-10-CM | POA: Diagnosis not present

## 2023-06-27 LAB — CBC WITH DIFFERENTIAL (CANCER CENTER ONLY)
Abs Immature Granulocytes: 0.02 10*3/uL (ref 0.00–0.07)
Basophils Absolute: 0.1 10*3/uL (ref 0.0–0.1)
Basophils Relative: 1 %
Eosinophils Absolute: 0.4 10*3/uL (ref 0.0–0.5)
Eosinophils Relative: 5 %
HCT: 38.7 % (ref 36.0–46.0)
Hemoglobin: 13 g/dL (ref 12.0–15.0)
Immature Granulocytes: 0 %
Lymphocytes Relative: 17 %
Lymphs Abs: 1.2 10*3/uL (ref 0.7–4.0)
MCH: 32.1 pg (ref 26.0–34.0)
MCHC: 33.6 g/dL (ref 30.0–36.0)
MCV: 95.6 fL (ref 80.0–100.0)
Monocytes Absolute: 0.7 10*3/uL (ref 0.1–1.0)
Monocytes Relative: 10 %
Neutro Abs: 4.7 10*3/uL (ref 1.7–7.7)
Neutrophils Relative %: 67 %
Platelet Count: 262 10*3/uL (ref 150–400)
RBC: 4.05 MIL/uL (ref 3.87–5.11)
RDW: 12.4 % (ref 11.5–15.5)
WBC Count: 7 10*3/uL (ref 4.0–10.5)
nRBC: 0 % (ref 0.0–0.2)

## 2023-06-27 LAB — CMP (CANCER CENTER ONLY)
ALT: 29 U/L (ref 0–44)
AST: 28 U/L (ref 15–41)
Albumin: 3.7 g/dL (ref 3.5–5.0)
Alkaline Phosphatase: 72 U/L (ref 38–126)
Anion gap: 9 (ref 5–15)
BUN: 17 mg/dL (ref 8–23)
CO2: 22 mmol/L (ref 22–32)
Calcium: 8.9 mg/dL (ref 8.9–10.3)
Chloride: 103 mmol/L (ref 98–111)
Creatinine: 1.12 mg/dL — ABNORMAL HIGH (ref 0.44–1.00)
GFR, Estimated: 52 mL/min — ABNORMAL LOW (ref >60)
Glucose, Bld: 115 mg/dL — ABNORMAL HIGH (ref 70–99)
Potassium: 4.5 mmol/L (ref 3.5–5.1)
Sodium: 134 mmol/L — ABNORMAL LOW (ref 135–145)
Total Bilirubin: 0.6 mg/dL (ref <1.2)
Total Protein: 6.8 g/dL (ref 6.5–8.1)

## 2023-06-27 MED ORDER — ANASTROZOLE 1 MG PO TABS: 1.0000 mg | ORAL_TABLET | Freq: Every day | ORAL | 1 refills | Status: DC

## 2023-06-27 NOTE — Assessment & Plan Note (Addendum)
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  S/p adjuvant radiation of right breast. She tolerates-Arimidex  1mg  daily, recommend to continue.  Refills Rx sent to pharmacy.  Bilateral  breast mammogram obtained by surgeon, results were reviewed.  Repeat bilateral mammogram in 12 months.

## 2023-06-27 NOTE — Assessment & Plan Note (Signed)
Possibly due to recent NSADIS use.  Encourage oral hydration and avoid nephrotoxins.  Encourage patient to discuss with PCP

## 2023-06-27 NOTE — Progress Notes (Signed)
Pt here for follow up. Pt reports that she has been having joint pain.

## 2023-06-27 NOTE — Assessment & Plan Note (Signed)
Recommend patient to take Tylenol 650mg  Q6h PRN.

## 2023-06-27 NOTE — Assessment & Plan Note (Signed)
04/15/2022 DEXA showed osteopenia. Recommend patient to take calcium 1200 mg daily and vitamin D supplementation. Repeat DEXA every 2 years 

## 2023-06-27 NOTE — Progress Notes (Signed)
Hematology/Oncology Progress note [phone] [phone]       CHIEF COMPLAINTS/PURPOSE OF CONSULTATION:  Right breast cancer  ASSESSMENT & PLAN:   Cancer Staging  Breast cancer in female Kristina [name]) Staging form: Breast, AJCC 8th Edition - Clinical stage from 05/07/2022: Stage IA (cT1c, cN0, cM0, G2, ER+, PR+, HER2-) - Signed by Rickard Patience, MD on 05/07/2022 - Pathologic stage from 05/24/2022: Stage IA (pT1b, pN0, cM0, G2, ER+, PR+, HER2-, Oncotype DX score: 34) - Signed by Rickard Patience, MD on 06/15/2022   Breast cancer in female Kristina [name]) Right breast invasive carcinoma, 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  S/p adjuvant radiation of right breast. She tolerates-Arimidex  1mg  daily, recommend to continue.  Refills Rx sent to pharmacy.  Bilateral  breast mammogram obtained by surgeon, results were reviewed.  Repeat bilateral mammogram in 12 months.   Osteopenia 04/15/2022 DEXA showed osteopenia. Recommend patient to take calcium 1200 mg daily and vitamin D supplementation. Repeat DEXA every 2 years  Arthralgia Recommend patient to take Tylenol 650mg  Q6h PRN.    Elevated serum creatinine Possibly due to recent NSADIS use.  Encourage oral hydration and avoid nephrotoxins.  Encourage patient to discuss with PCP    Orders Placed This Encounter  Procedures   CBC with Differential ([name] Only)    Standing Status:   Future    Standing Expiration Date:   06/26/2024   CMP ([name] only)    Standing Status:   Future    Standing Expiration Date:   06/26/2024    Follow up 6 months All questions were answered. The patient knows to call the clinic with any problems, questions or concerns.  Rickard Patience, MD, PhD [name] 06/27/2023    HISTORY OF PRESENTING ILLNESS:  Kristina [name] 72 y.o. female presents to  establish care for right breast cancer  Menarche at age of 12-14 No child  OCP use: <5 years History of hysterectomy:  Menopausal status: postmenopausal History of HRT use: 7-8 years History of chest radiation: no Number of previous breast biopsies:  no  Family history + bladder cancer, breast cancer I have reviewed her chart and materials related to her cancer extensively and collaborated history with the patient. Summary of oncologic history is as follows: Oncology History  Breast cancer in female Kristina [name])  04/15/2022 Imaging   Bone density showed osteopenia, FRAX score was not calculated due to patient was on estrogen replacement therapy.   04/16/2022 Mammogram   Bilateral screening mammogram In the right breast, a possible mass warrants further evaluation. In the left breast, no findings suspicious for malignancy    04/28/2022 Imaging   Unilateral right breast diagnostic mammogram There is persistence of a 1.7 cm spiculated mass in the medial aspect of the breast. There are no malignant type microcalcifications. Targeted ultrasound is performed, showing an irregular hypoechoic mass in the right breast at 3 o'clock 3 cm from the nipple measuring 1.7 x 0.9 x 0.8 cm.  Sonographic evaluation of the right axilla does not show any enlarged adenopathy.   05/07/2022 Initial Diagnosis   Breast cancer in female Kristina [name])  04/30/22 s/p right breast biopsy Pathology showed invasive moderately differentiated ductal adenocarcinoma Grade 2.  ER 100% +, PR + 90%, HER2 equivocal IHC 2+, FISH negative.     05/07/2022 Cancer Staging   Staging form: Breast, AJCC 8th Edition - Clinical stage  from 05/07/2022: Stage IA (cT1c, cN0, cM0, G2, ER+, PR+, HER2-) - Signed by Rickard Patience, MD on 05/07/2022 Stage prefix: Initial diagnosis Histologic grading system: 3 grade system   05/24/2022 Surgery   S/p right lumpectomy and right SLNB   Pathology showed  Invasive mammary carcinoma NOS, Grade 2, DICS present, no LVI,  all margins are negative for invasive carcinoma and DCIS. pT1b pN0    05/24/2022 Oncotype testing   34, recurrence rate at 9 years with TAM 22%, absolute chemotherapy benefit >15%.    05/24/2022 Cancer Staging   Staging form: Breast, AJCC 8th Edition - Pathologic stage from 05/24/2022: Stage IA (pT1b, pN0, cM0, G2, ER+, PR+, HER2-, Oncotype DX score: 34) - Signed by Rickard Patience, MD on 06/15/2022 Multigene prognostic tests performed: Oncotype DX Recurrence score range: Greater than or equal to 11 Histologic grading system: 3 grade system   06/29/2022 -  Chemotherapy   Patient declined adjuvant chemotherapy TC     07/15/2022 - 08/27/2022 Radiation Therapy   Patient had a right breast adjuvant radiation   11/24/2022 Imaging   Unilateral diagnostic mammogram  No mammographic evidence of malignancy.     \   INTERVAL HISTORY Kristina [name] is a 72 y.o. female who has above history reviewed by me today presents for follow up visit for  Stage IA right breast cancer.  She tolerates Arimidex 1mg  daily with managable side effects, including hot flash, arthralgia. She is no longer taking any NSAIDs  She has chronic loose BM and has been diagnosed of IBS.   MEDICAL HISTORY:  Past Medical History:  Diagnosis Date   Actinic keratosis    Anxiety    Arthritis    Back pain    Breast cancer (Kristina [name])    Cancer (Kristina [name])    basal and squamous cell skin   Chicken pox    Chronic back pain    Complication of anesthesia    hard to wake up after lap cho-had to stay overnight-bp and hr dropped   COPD (chronic obstructive pulmonary disease) (Kristina [name])    Depression    Dyspnea    due to copd   Gallbladder problem    GERD (gastroesophageal reflux disease)    Goiter    Hypertension    Hypothyroidism    Joint pain    Lactose intolerance    Migraines    Osteoarthritis    PONV (postoperative nausea and vomiting)    very nauseated   SOB (shortness of breath)    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,  Mohs   Thyroid disease    Thyroid nodule    Tremor    Vitamin D deficiency     SURGICAL HISTORY: Past Surgical History:  Procedure Laterality Date   ABDOMINAL HYSTERECTOMY  1978   BIOPSY  03/29/2023   Procedure: BIOPSY;  Surgeon: Toney Reil, MD;  Location: [facility] ENDOSCOPY;  Service: Gastroenterology;;   BREAST LUMPECTOMY Right 05/24/2022   BREAST LUMPECTOMY,RADIO FREQ LOCALIZER,AXILLARY SENTINEL LYMPH NODE BIOPSY Right 05/24/2022   Procedure: BREAST LUMPECTOMY,RADIO FREQ LOCALIZER,AXILLARY SENTINEL LYMPH NODE BIOPSY;  Surgeon: Campbell Lerner, MD;  Location: [facility] ORS;  Service: General;  Laterality: Right;   CATARACT EXTRACTION Left 12/20/2017   will have the right one completed a month later    CATARACT EXTRACTION Right    CHOLECYSTECTOMY  2000   COLONOSCOPY  2007   COLONOSCOPY     2018   COLONOSCOPY WITH PROPOFOL N/A 03/29/2023   Procedure: COLONOSCOPY WITH PROPOFOL;  Surgeon: Toney Reil, MD;  Location[name];  Service: Gastroenterology;  Laterality: N/A;   ESOPHAGOGASTRODUODENOSCOPY N/A 03/29/2023   Procedure: ESOPHAGOGASTRODUODENOSCOPY (EGD);  Surgeon: Toney Reil, MD;  Location: [facility] ENDOSCOPY;  Service: Gastroenterology;  Laterality: N/A;   JOINT REPLACEMENT Right 08/19/2017   great toe    POLYPECTOMY  03/29/2023   Procedure: POLYPECTOMY;  Surgeon: Toney Reil, MD;  Location: [facility] ENDOSCOPY;  Service: Gastroenterology;;   TOE SURGERY     UPPER GASTROINTESTINAL ENDOSCOPY  [phone]   UPPER GI ENDOSCOPY     2014    SOCIAL HISTORY: Social History   Socioeconomic History   Marital status: Single    Spouse name: Not on file   Number of children: 0   Years of education: 12   Highest education level: Some college, no degree  Occupational History   Occupation: retired    Comment: administration  Tobacco Use   Smoking status: Former    Current packs/day: 0.00    Average packs/day: 1.5 packs/day for 25.0 years (37.5 ttl pk-yrs)     Types: Cigarettes    Start date: 03/03/1965    Quit date: 03/03/1990    Years since quitting: 33.3   Smokeless tobacco: Never  Vaping Use   Vaping status: Never Used  Substance and Sexual Activity   Alcohol use: Not Currently   Drug use: No   Sexual activity: Never  Other Topics Concern   Not on file  Social History Narrative   Lives alone   Caffeine- tea, 1 cup   Social Determinants of Health   Financial Resource Strain: Low Risk  (03/29/2022)   Overall Financial Resource Strain (CARDIA)    Difficulty of Paying Living Expenses: Not very hard  Food Insecurity: No Food Insecurity (03/29/2022)   Hunger Vital Sign    Worried About Running Out of Food in the Last Year: Never true    Ran Out of Food in the Last Year: Never true  Transportation Needs: No Transportation Needs (03/29/2022)   PRAPARE - Administrator, Civil Service (Medical): No    Lack of Transportation (Non-Medical): No  Physical Activity: Sufficiently Active (03/29/2022)   Exercise Vital Sign    Days of Exercise per Week: 3 days    Minutes of Exercise per Session: 60 min  Stress: Stress Concern Present (03/29/2022)   [employer] of Occupational Health - Occupational Stress Questionnaire    Feeling of Stress : Very much  Social Connections: Socially Isolated (03/29/2022)   Social Connection and Isolation Panel [NHANES]    Frequency of Communication with Friends and Family: More than three times a week    Frequency of Social Gatherings with Friends and Family: Once a week    Attends Religious Services: Never    Database administrator or Organizations: No    Attends Engineer, structural: Not on file    Marital Status: Divorced  Catering manager Violence: Not on file    FAMILY HISTORY: Family History  Problem Relation Age of Onset   Arthritis Mother    Diabetes Mother    COPD Mother    Heart failure Mother    Stroke Father    Hypertension Father    Hyperlipidemia Father    Diabetes  Father    CAD Father    Heart disease Father    Heart failure Father    Breast cancer Maternal Aunt    Cancer Sister        bladder cancer  Throat cancer Brother    Colon cancer Neg Hx    Stomach cancer Neg Hx    Esophageal cancer Neg Hx     ALLERGIES:  is allergic to sulfa antibiotics, hydrocodone, and meloxicam.  MEDICATIONS:  Current Outpatient Medications  Medication Sig Dispense Refill   Acetaminophen (TYLENOL PO) Take 500 mg by mouth 2 (two) times daily.     albuterol (PROAIR HFA) 108 (90 Base) MCG/ACT inhaler Inhale 2 puffs into the lungs every 6 (six) hours as needed for wheezing or shortness of breath. 32 g 3   ALPRAZolam (XANAX) 0.25 MG tablet TAKE 1 TABLET BY MOUTH TWICE DAILY AS NEEDED FOR ANXIETY (FOLLOW  UP  DUE  IN  OCTOBER  2024) 45 tablet 0   atorvastatin (LIPITOR) 10 MG tablet Take 1 tablet (10 mg total) by mouth daily. 90 tablet 2   azelaic acid (AZELEX) 20 % cream Apply topically 2 (two) times daily. After skin is thoroughly washed and patted dry, gently but thoroughly massage a thin film of azelaic acid cream into the affected area twice daily, in the morning and evening.     Calcium-Vitamin D-Vitamin K 500-100-40 MG-UNT-MCG CHEW Chew 1 tablet by mouth daily. chewable     Cholecalciferol (VITAMIN D3) 125 MCG (5000 UT) CAPS Take 1 capsule by mouth daily.     cyanocobalamin (VITAMIN B12) 500 MCG tablet Take 1,000 mcg by mouth daily.     cyclobenzaprine (FLEXERIL) 5 MG tablet Take 1 tablet (5 mg total) by mouth at bedtime. 90 tablet 1   levothyroxine (SYNTHROID) 25 MCG tablet TAKE 1 TABLET BY MOUTH ONCE DAILY BEFORE BREAKFAST 90 tablet 1   loratadine (CLARITIN) 10 MG tablet Take 10 mg by mouth daily.     Magnesium 250 MG TABS Take 1 tablet by mouth daily.     meclizine (ANTIVERT) 12.5 MG tablet Take 1 tablet (12.5 mg total) by mouth 3 (three) times daily as needed for dizziness. 60 tablet 1   Multiple Vitamin (MULTIVITAMIN) tablet Take 1 tablet by mouth daily.      pantoprazole (PROTONIX) 40 MG tablet Take 1 tablet (40 mg total) by mouth 2 (two) times daily before a meal. 180 tablet 0   PARoxetine (PAXIL) 10 MG tablet TAKE ONE TABLET BY MOUTH ONE TIME DAILY 90 tablet 2   propranolol (INDERAL) 40 MG tablet TAKE ONE TABLET BY MOUTH TWICE A DAY 180 tablet 2   RESTASIS 0.05 % ophthalmic emulsion Place 1 drop into both eyes 2 (two) times daily.     VOLTAREN 1 % GEL 2 g as needed.     anastrozole (ARIMIDEX) 1 MG tablet Take 1 tablet (1 mg total) by mouth daily. 100 tablet 1   No current facility-administered medications for this visit.    Review of Systems  Constitutional:  Negative for appetite change, chills, fatigue and fever.  HENT:   Negative for hearing loss and voice change.   Eyes:  Negative for eye problems.  Respiratory:  Negative for chest tightness and cough.   Cardiovascular:  Negative for chest pain.  Gastrointestinal:  Positive for diarrhea. Negative for abdominal distention, abdominal pain and blood in stool.  Endocrine: Negative for hot flashes.  Genitourinary:  Negative for difficulty urinating and frequency.   Musculoskeletal:  Negative for arthralgias.  Skin:  Negative for itching and rash.  Neurological:  Negative for extremity weakness.  Hematological:  Negative for adenopathy.  Psychiatric/Behavioral:  Negative for confusion.      PHYSICAL EXAMINATION: ECOG PERFORMANCE  STATUS: 0 - Asymptomatic  Vitals:   06/27/23 1010  BP: 136/85  Pulse: 79  Resp: 18  Temp: 98.2 F (36.8 C)   Filed Weights   06/27/23 1010  Weight: 187 lb 3.2 oz (84.9 kg)    Physical Exam Constitutional:      General: She is not in acute distress. HENT:     Head: Normocephalic.  Eyes:     General: No scleral icterus. Cardiovascular:     Rate and Rhythm: Normal rate.  Pulmonary:     Effort: Pulmonary effort is normal. No respiratory distress.  Abdominal:     General: There is no distension.  Musculoskeletal:        General: Normal range of  motion.     Cervical back: Normal range of motion and neck supple.  Skin:    Findings: No bruising.  Neurological:     Mental Status: She is alert and oriented to person, place, and time. Mental status is at baseline.     Cranial Nerves: No cranial nerve deficit.     Motor: No abnormal muscle tone.  Psychiatric:        Mood and Affect: Mood and affect normal.     She declines breast exam.   LABORATORY DATA:  I have reviewed the data as listed    Latest Ref Rng & Units 06/27/2023    9:58 AM 02/07/2023    1:44 PM 12/23/2022   10:01 AM  CBC  WBC 4.0 - 10.5 K/uL 7.0  7.7  7.6   Hemoglobin 12.0 - 15.0 g/dL 29.5  62.1  30.8   Hematocrit 36.0 - 46.0 % 38.7  38.9  38.2   Platelets 150 - 400 K/uL 262  235  264       Latest Ref Rng & Units 06/27/2023    9:57 AM 02/07/2023    1:44 PM 12/23/2022   10:01 AM  CMP  Glucose 70 - 99 mg/dL 657  846  962   BUN 8 - 23 mg/dL 17  15  17    Creatinine 0.44 - 1.00 mg/dL 9.52  8.41  3.24   Sodium 135 - 145 mmol/L 134  138  138   Potassium 3.5 - 5.1 mmol/L 4.5  3.9  4.1   Chloride 98 - 111 mmol/L 103  104  106   CO2 22 - 32 mmol/L 22  19  22    Calcium 8.9 - 10.3 mg/dL 8.9  9.2  8.9   Total Protein 6.5 - 8.1 g/dL 6.8   6.9   Total Bilirubin <1.2 mg/dL 0.6   0.6   Alkaline Phos 38 - 126 U/L 72   76   AST 15 - 41 U/L 28   25   ALT 0 - 44 U/L 29   22      RADIOGRAPHIC STUDIES: I have personally reviewed the radiological images as listed and agreed with the findings in the report. No results found.

## 2023-06-28 ENCOUNTER — Other Ambulatory Visit: Payer: Self-pay

## 2023-07-01 ENCOUNTER — Ambulatory Visit: Payer: PPO | Admitting: Family Medicine

## 2023-07-05 ENCOUNTER — Encounter: Payer: Self-pay | Admitting: Family Medicine

## 2023-07-05 ENCOUNTER — Ambulatory Visit: Payer: PPO

## 2023-07-05 ENCOUNTER — Other Ambulatory Visit: Payer: Self-pay

## 2023-07-05 ENCOUNTER — Ambulatory Visit (INDEPENDENT_AMBULATORY_CARE_PROVIDER_SITE_OTHER): Payer: PPO | Admitting: Family Medicine

## 2023-07-05 VITALS — BP 120/76 | HR 91 | Temp 98.1°F | Resp 16 | Ht 62.0 in | Wt 188.0 lb

## 2023-07-05 DIAGNOSIS — R7303 Prediabetes: Secondary | ICD-10-CM | POA: Diagnosis not present

## 2023-07-05 DIAGNOSIS — I700 Atherosclerosis of aorta: Secondary | ICD-10-CM | POA: Diagnosis not present

## 2023-07-05 DIAGNOSIS — J449 Chronic obstructive pulmonary disease, unspecified: Secondary | ICD-10-CM | POA: Diagnosis not present

## 2023-07-05 DIAGNOSIS — G4700 Insomnia, unspecified: Secondary | ICD-10-CM

## 2023-07-05 DIAGNOSIS — I10 Essential (primary) hypertension: Secondary | ICD-10-CM | POA: Diagnosis not present

## 2023-07-05 DIAGNOSIS — R944 Abnormal results of kidney function studies: Secondary | ICD-10-CM

## 2023-07-05 DIAGNOSIS — M2550 Pain in unspecified joint: Secondary | ICD-10-CM

## 2023-07-05 DIAGNOSIS — F418 Other specified anxiety disorders: Secondary | ICD-10-CM

## 2023-07-05 DIAGNOSIS — R059 Cough, unspecified: Secondary | ICD-10-CM | POA: Diagnosis not present

## 2023-07-05 LAB — POCT GLYCOSYLATED HEMOGLOBIN (HGB A1C): Hemoglobin A1C: 5.4 % (ref 4.0–5.6)

## 2023-07-05 MED ORDER — VENLAFAXINE HCL ER 37.5 MG PO CP24
37.5000 mg | ORAL_CAPSULE | Freq: Every day | ORAL | 2 refills | Status: DC
Start: 2023-07-05 — End: 2023-10-14

## 2023-07-05 MED ORDER — ALPRAZOLAM 0.25 MG PO TABS: 0.2500 mg | ORAL_TABLET | Freq: Two times a day (BID) | ORAL | 3 refills | Status: DC | PRN

## 2023-07-05 MED ORDER — BUDESONIDE-FORMOTEROL FUMARATE 80-4.5 MCG/ACT IN AERO
2.0000 | INHALATION_SPRAY | Freq: Two times a day (BID) | RESPIRATORY_TRACT | 3 refills | Status: DC
Start: 2023-07-05 — End: 2023-07-06

## 2023-07-05 NOTE — Assessment & Plan Note (Signed)
Alprazolam 0.25 mg daily at bedtime is still helping. No changes in current management. Continue good sleep hygiene.

## 2023-07-05 NOTE — Assessment & Plan Note (Signed)
Problem is getting worse. Paroxetine, which was initially prescribed to help with hot flashes, is not helping. Recommend weaning of paroxetine, instructions on AVS. Effexor XR 37.5 mg daily recommended. She would like to increase dose of alprazolam, for the past couple weeks she has taking it more frequent. Total amount of tablets per month increased from 45 to 50 tablets. Instructed to let me know in about 6 to 8 weeks if Effexor is helping with symptoms.

## 2023-07-05 NOTE — Assessment & Plan Note (Addendum)
BP adequately controlled. Continue propranolol 40 mg twice daily and low-salt diet. Continue monitoring BP regularly. Eye exam is current.

## 2023-07-05 NOTE — Assessment & Plan Note (Signed)
Reporting recurrent cough and wheezing for the past few weeks. She has not had a PFT's. Former smoker and COPD like changes seen on imaging. We discussed differential diagnosis. She agrees with trying Symbicort 80-4.5 mcg twice daily, instructed to rinse after use. Continue albuterol inhaler 1 to 2 puff every 6 hours as needed.

## 2023-07-05 NOTE — Progress Notes (Signed)
HPI: Kristina [name] is a 72 y.o. female with a PMHx significant for HTN, migraine headaches, COPD, GERD, hypothyroidism, breast cancer, prediabetes, and anxiety, who is here today for chronic disease management.  Last seen on 12/31/2022  Anxiety and depression:  She is currently taking alprazolam 0.25 mg bid prn. She says she is taking it every night, and has been taking a second dose frequently lately.  She mention she has had a falling out with her best friend of 70 years because of politics, and this is causing significant stress for her. She says she is trying to exercise, but is often fatigued in the afternoon. She has an indoor bike that she rides for 15-20 minutes some afternoons.     07/05/2023    2:29 PM 12/31/2022   12:18 PM 03/31/2022   12:02 PM 10/26/2021   11:22 AM 02/19/2021    3:07 PM  Depression screen PHQ 2/9  Decreased Interest 3 0 3 0 3  Down, Depressed, Hopeless 3 0 1 0 3  PHQ - 2 Score 6 0 4 0 6  Altered sleeping 3 0 3 2 1   Tired, decreased energy 3 0 3 1 3   Change in appetite 3 0 2 1 1   Feeling bad or failure about yourself  0 0 0 0 0  Trouble concentrating 0 0 0 0 0  Moving slowly or fidgety/restless 0 0 1 0 0  Suicidal thoughts 0 0 0 0 0  PHQ-9 Score 15 0 13 4 11   Difficult doing work/chores Very difficult  Very difficult Somewhat difficult Somewhat difficult      07/05/2023    2:29 PM 02/19/2021    3:20 PM 08/19/2020   11:40 AM 12/22/2019   12:43 PM  GAD 7 : Generalized Anxiety Score  Nervous, Anxious, on Edge 3 2 1 1   Control/stop worrying 3 1 1 1   Worry too much - different things 3 2 1 1   Trouble relaxing 3 0 1 1  Restless 2 0 0 0  Easily annoyed or irritable 3 1 1 1   Afraid - awful might happen 0 0 0 0  Total GAD 7 Score 17 6 5 5   Anxiety Difficulty Very difficult Somewhat difficult Somewhat difficult Somewhat difficult   Hot flashes:  She is currently taking Paxil 10 mg daily. She doesn't believe it is helping.  Right breast invasive  carcinoma s/p lumpectomy and  adjutant radiation.*. Currently on Anastrozole.  Generalized joint pain Patient also complains of worsening joint pain: IP hands, shoulders,hips,and knees mainly. No edema or erythema. She was taking Advil until recently, when her oncologist recommended stopping use due to abnormal renal function test. Tried Duloxetine in the past, did not feel it helped.  She mentions she is having occasional difficult swallowing. She states that she recently had an upper endoscopy, negative otherwise. She is on pantoprazole 40 mg , which she took bid for a while and now once daily..   Cough:  She further complains she has had a cough for about a month. She also endorses wheezing on exhalation.  She has an albuterol inhaler at home, which helps.  She used to smoke and has been dx'ed with COPD in the past, CXR 03/2020. Echo in 04/2017 done due to shortness of breath: LVEF 55 to 60%  Hypertension:  Medications: She is currently taking propranolol 40 mg bid for HTN and tremor.  BP readings at home: She is checking her BP regularly at home, and says  her readings do not get higher than 130/80.  Side effects: none UTD on routine vision care. Her last appointment was in 03/2023.  Negative for unusual or severe headache, visual changes, exertional chest pain, dyspnea,  focal weakness, or edema.  Lab Results  Component Value Date   CREATININE 1.12 (H) 06/27/2023   BUN 17 06/27/2023   NA 134 (L) 06/27/2023   K 4.5 06/27/2023   CL 103 06/27/2023   CO2 22 06/27/2023   Prediabetes:  Negative for polydipsia,polyuria, or polyphagia.  Lab Results  Component Value Date   HGBA1C 6.0 03/31/2022   Review of Systems  Constitutional:  Negative for chills and fever.  Gastrointestinal:  Negative for abdominal pain, nausea and vomiting.  Endocrine: Negative for cold intolerance and heat intolerance.  Genitourinary:  Negative for decreased urine volume, dysuria and hematuria.   Musculoskeletal:  Positive for arthralgias. Negative for joint swelling.  Skin:  Negative for rash.  Neurological:  Negative for syncope and facial asymmetry.  Psychiatric/Behavioral:  Negative for confusion and hallucinations.   See other pertinent positives and negatives in HPI.  Current Outpatient Medications on File Prior to Visit  Medication Sig Dispense Refill   Acetaminophen (TYLENOL PO) Take 500 mg by mouth 2 (two) times daily.     albuterol (PROAIR HFA) 108 (90 Base) MCG/ACT inhaler Inhale 2 puffs into the lungs every 6 (six) hours as needed for wheezing or shortness of breath. 32 g 3   anastrozole (ARIMIDEX) 1 MG tablet Take 1 tablet (1 mg total) by mouth daily. 100 tablet 1   atorvastatin (LIPITOR) 10 MG tablet Take 1 tablet (10 mg total) by mouth daily. 90 tablet 2   azelaic acid (AZELEX) 20 % cream Apply topically 2 (two) times daily. After skin is thoroughly washed and patted dry, gently but thoroughly massage a thin film of azelaic acid cream into the affected area twice daily, in the morning and evening.     Calcium-Vitamin D-Vitamin K 500-100-40 MG-UNT-MCG CHEW Chew 1 tablet by mouth daily. chewable     Cholecalciferol (VITAMIN D3) 125 MCG (5000 UT) CAPS Take 1 capsule by mouth daily.     cyanocobalamin (VITAMIN B12) 500 MCG tablet Take 1,000 mcg by mouth daily.     cyclobenzaprine (FLEXERIL) 5 MG tablet Take 1 tablet (5 mg total) by mouth at bedtime. 90 tablet 1   levothyroxine (SYNTHROID) 25 MCG tablet TAKE 1 TABLET BY MOUTH ONCE DAILY BEFORE BREAKFAST 90 tablet 1   loratadine (CLARITIN) 10 MG tablet Take 10 mg by mouth daily.     Magnesium 250 MG TABS Take 1 tablet by mouth daily.     meclizine (ANTIVERT) 12.5 MG tablet Take 1 tablet (12.5 mg total) by mouth 3 (three) times daily as needed for dizziness. 60 tablet 1   Multiple Vitamin (MULTIVITAMIN) tablet Take 1 tablet by mouth daily.     pantoprazole (PROTONIX) 40 MG tablet Take 1 tablet (40 mg total) by mouth 2 (two)  times daily before a meal. 180 tablet 0   propranolol (INDERAL) 40 MG tablet TAKE ONE TABLET BY MOUTH TWICE A DAY 180 tablet 2   RESTASIS 0.05 % ophthalmic emulsion Place 1 drop into both eyes 2 (two) times daily.     VOLTAREN 1 % GEL 2 g as needed.     No current facility-administered medications on file prior to visit.    Past Medical History:  Diagnosis Date   Actinic keratosis    Anxiety    Arthritis  Back pain    Breast cancer (HCC)    Cancer (HCC)    basal and squamous cell skin   Chicken pox    Chronic back pain    Complication of anesthesia    hard to wake up after lap cho-had to stay overnight-bp and hr dropped   COPD (chronic obstructive pulmonary disease) (HCC)    Depression    Dyspnea    due to copd   Gallbladder problem    GERD (gastroesophageal reflux disease)    Goiter    Hypertension    Hypothyroidism    Joint pain    Lactose intolerance    Migraines    Osteoarthritis    PONV (postoperative nausea and vomiting)    very nauseated   SOB (shortness of breath)    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, Mohs   Thyroid disease    Thyroid nodule    Tremor    Vitamin D deficiency    Allergies  Allergen Reactions   Sulfa Antibiotics Rash    Rash all over body/fever   Hydrocodone Nausea Only    Per patient    Meloxicam Other (See Comments)    "skin infection on leg"    Social History   Socioeconomic History   Marital status: Single    Spouse name: Not on file   Number of children: 0   Years of education: 12   Highest education level: Some college, no degree  Occupational History   Occupation: retired    Comment: administration  Tobacco Use   Smoking status: Former    Current packs/day: 0.00    Average packs/day: 1.5 packs/day for 25.0 years (37.5 ttl pk-yrs)    Types: Cigarettes    Start date: 03/03/1965    Quit date: 03/03/1990    Years since quitting: 33.3   Smokeless tobacco: Never  Vaping Use   Vaping status: Never Used   Substance and Sexual Activity   Alcohol use: Not Currently   Drug use: No   Sexual activity: Never  Other Topics Concern   Not on file  Social History Narrative   Lives alone   Caffeine- tea, 1 cup   Social Determinants of Health   Financial Resource Strain: Low Risk  (06/30/2023)   Overall Financial Resource Strain (CARDIA)    Difficulty of Paying Living Expenses: Not hard at all  Food Insecurity: No Food Insecurity (06/30/2023)   Hunger Vital Sign    Worried About Running Out of Food in the Last Year: Never true    Ran Out of Food in the Last Year: Never true  Transportation Needs: No Transportation Needs (06/30/2023)   PRAPARE - Administrator, Civil Service (Medical): No    Lack of Transportation (Non-Medical): No  Physical Activity: Insufficiently Active (06/30/2023)   Exercise Vital Sign    Days of Exercise per Week: 3 days    Minutes of Exercise per Session: 20 min  Stress: Stress Concern Present (06/30/2023)   [employer] of Occupational Health - Occupational Stress Questionnaire    Feeling of Stress : Very much  Social Connections: Socially Isolated (06/30/2023)   Social Connection and Isolation Panel [NHANES]    Frequency of Communication with Friends and Family: Once a week    Frequency of Social Gatherings with Friends and Family: Once a week    Attends Religious Services: Never    Database administrator or Organizations: No    Attends Banker Meetings:  Not on file    Marital Status: Divorced    Vitals:   07/05/23 1420  BP: 120/76  Pulse: 91  Resp: 16  Temp: 98.1 F (36.7 C)  SpO2: 99%   Body mass index is 34.39 kg/m.  Physical Exam Vitals and nursing note reviewed.  Constitutional:      General: She is not in acute distress.    Appearance: She is well-developed.  HENT:     Head: Normocephalic and atraumatic.     Mouth/Throat:     Mouth: Mucous membranes are moist.     Pharynx: Oropharynx is clear.  Eyes:      Conjunctiva/sclera: Conjunctivae normal.  Cardiovascular:     Rate and Rhythm: Normal rate and regular rhythm.     Pulses:          Dorsalis pedis pulses are 2+ on the right side and 2+ on the left side.     Heart sounds: No murmur heard. Pulmonary:     Effort: Pulmonary effort is normal. No respiratory distress.     Breath sounds: Normal breath sounds.  Abdominal:     Palpations: Abdomen is soft. There is no hepatomegaly or mass.     Tenderness: There is no abdominal tenderness.  Musculoskeletal:     Right lower leg: No edema.     Left lower leg: No edema.  Lymphadenopathy:     Cervical: No cervical adenopathy.  Skin:    General: Skin is warm.     Findings: No erythema or rash.  Neurological:     General: No focal deficit present.     Mental Status: She is alert and oriented to person, place, and time.     Cranial Nerves: No cranial nerve deficit.     Gait: Gait normal.  Psychiatric:        Mood and Affect: Mood is anxious. Affect is labile.        Thought Content: Thought content does not include homicidal or suicidal ideation. Thought content does not include homicidal or suicidal plan.   ASSESSMENT AND PLAN:  Ms. Gitchell was seen today for chronic disease follow up.   Orders Placed This Encounter  Procedures   DG Chest 2 View   Microalbumin / creatinine urine ratio   POC HgB A1c   Lab Results  Component Value Date   HGBA1C 5.4 07/05/2023   Polyarthralgia Assessment & Plan: We discussed possible etiologies, history of OA. Problem could be aggravated by anastrozole. Recommend Tylenol 500 mg to 3 times per day. In the past she has tried duloxetine and did not feel like it was helping. Effexor XR added today for anxiety may help.  Insomnia, unspecified type Assessment & Plan: Alprazolam 0.25 mg daily at bedtime is still helping. No changes in current management. Continue good sleep hygiene.  Orders: -     ALPRAZolam; Take 1 tablet (0.25 mg total) by mouth 2  (two) times daily as needed for anxiety.  Dispense: 50 tablet; Refill: 3  Essential hypertension Assessment & Plan: BP adequately controlled. Continue propranolol 40 mg twice daily and low-salt diet. Continue monitoring BP regularly. Eye exam is current.  Chronic obstructive pulmonary disease, unspecified COPD type (HCC) Assessment & Plan: Reporting recurrent cough and wheezing for the past few weeks. She has not had a PFT's. Former smoker and COPD like changes seen on imaging. We discussed differential diagnosis. She agrees with trying Symbicort 80-4.5 mcg twice daily, instructed to rinse after use. Continue albuterol inhaler 1 to 2  puff every 6 hours as needed.  Orders: -     Budesonide-Formoterol Fumarate; Inhale 2 puffs into the lungs 2 (two) times daily.  Dispense: 1 each; Refill: 3 -     DG Chest 2 View; Future  Other specified anxiety disorders Assessment & Plan: Problem is getting worse. Paroxetine, which was initially prescribed to help with hot flashes, is not helping. Recommend weaning of paroxetine, instructions on AVS. Effexor XR 37.5 mg daily recommended. She would like to increase dose of alprazolam, for the past couple weeks she has taking it more frequent. Total amount of tablets per month increased from 45 to 50 tablets. Instructed to let me know in about 6 to 8 weeks if Effexor is helping with symptoms.  Orders: -     ALPRAZolam; Take 1 tablet (0.25 mg total) by mouth 2 (two) times daily as needed for anxiety.  Dispense: 50 tablet; Refill: 3 -     Venlafaxine HCl ER; Take 1 capsule (37.5 mg total) by mouth daily with breakfast.  Dispense: 30 capsule; Refill: 2  Prediabetes Assessment & Plan: Hemoglobin A1c improved, it went from 6.0 in 03/2022 to 5.4. Encourage consistency with a healthy lifestyle for diabetes prevention.  Orders: -     POCT glycosylated hemoglobin (Hb A1C)  Abnormal renal function test Cr 0.9-1.12 and e GFR 54-58. We discussed  diagnostic criteria for CKD. BMP is being ordered regularly by her oncologist. Recommend adequate hydration, low-salt diet, avoidance of NSAIDs. In the past she was on losartan, we could consider adding it back but at a lower dose. Further recommendation will be given according to lab results.  -     Microalbumin / creatinine urine ratio; Future  Thoracic aortic atherosclerosis [facility]) Assessment & Plan: Noted today when reviewing past imaging. Currently on atorvastatin 10 mg daily. Last LDL 110 in 02/2021. Will plan on lipid panel next visit.  I spent a total of 42 minutes in both face to face and non face to face activities for this visit on the date of this encounter. During this time history was obtained and documented, examination was performed, prior labs/imaging reviewed, and assessment/plan discussed.  Return in about 6 months (around 01/02/2024).  I, Rolla Etienne Wierda, acting as a scribe for Beda Dula [place], MD., have documented all relevant documentation on the behalf of Baila Rouse [place], MD, as directed by  Herminio Kniskern [place], MD while in the presence of Sonu Kruckenberg [place], MD.   I, Nialah Saravia [place], MD, have reviewed all documentation for this visit. The documentation on 07/05/23 for the exam, diagnosis, procedures, and orders are all accurate and complete.  Asaiah Scarber G. [place], MD  [facility]. [place] office.

## 2023-07-05 NOTE — Assessment & Plan Note (Signed)
Hemoglobin A1c improved, it went from 6.0 in 03/2022 to 5.4. Encourage consistency with a healthy lifestyle for diabetes prevention.

## 2023-07-05 NOTE — Assessment & Plan Note (Signed)
Noted today when reviewing past imaging. Currently on atorvastatin 10 mg daily. Last LDL 110 in 02/2021. Will plan on lipid panel next visit.

## 2023-07-05 NOTE — Assessment & Plan Note (Signed)
We discussed possible etiologies, history of OA. Problem could be aggravated by anastrozole. Recommend Tylenol 500 mg to 3 times per day. In the past she has tried duloxetine and did not feel like it was helping. Effexor XR added today for anxiety may help.

## 2023-07-05 NOTE — Patient Instructions (Addendum)
A few things to remember from today's visit:  Essential hypertension  Insomnia, unspecified type - Plan: ALPRAZolam (XANAX) 0.25 MG tablet  Chronic obstructive pulmonary disease, unspecified COPD type (HCC) - Plan: budesonide-formoterol (SYMBICORT) 80-4.5 MCG/ACT inhaler  Other specified anxiety disorders - Plan: ALPRAZolam (XANAX) 0.25 MG tablet, venlafaxine XR (EFFEXOR XR) 37.5 MG 24 hr capsule  Prediabetes - Plan: POC HgB A1c Start weaning of Paroxetine, alternate between 1 tab and 1/2 tab for 10 days then 1/2 tab daily for 10 days, then every other day for 10 days, then every 3rd day for 10 days and stop. Effexor started today, let me know in 6-8 weeks if it is helping.  If you need refills for medications you take chronically, please call your pharmacy. Do not use My Chart to request refills or for acute issues that need immediate attention. If you send a my chart message, it may take a few days to be addressed, specially if I am not in the office.  Please be sure medication list is accurate. If a new problem present, please set up appointment sooner than planned today.

## 2023-07-06 ENCOUNTER — Encounter: Payer: Self-pay | Admitting: Family Medicine

## 2023-07-06 ENCOUNTER — Other Ambulatory Visit: Payer: Self-pay | Admitting: Family Medicine

## 2023-07-06 ENCOUNTER — Telehealth: Payer: Self-pay

## 2023-07-06 DIAGNOSIS — J449 Chronic obstructive pulmonary disease, unspecified: Secondary | ICD-10-CM

## 2023-07-06 LAB — MICROALBUMIN / CREATININE URINE RATIO
Creatinine,U: 32.2 mg/dL
Microalb Creat Ratio: 2.2 mg/g (ref 0.0–30.0)
Microalb, Ur: <0.7 mg/dL (ref 0.0–1.9)

## 2023-07-06 MED ORDER — STIOLTO RESPIMAT 2.5-2.5 MCG/ACT IN AERS
2.0000 | INHALATION_SPRAY | Freq: Every day | RESPIRATORY_TRACT | 2 refills | Status: DC
Start: 2023-07-06 — End: 2023-10-14

## 2023-07-06 NOTE — Telephone Encounter (Signed)
Pharmacy Patient Advocate Encounter  Hello,   We have received a determination via our fax stating the following  Received notification from [facility] [employer] that Prior Authorization for [facility] has been DENIED.  Full denial letter will be uploaded to the media tab. See denial reason below.

## 2023-07-28 ENCOUNTER — Other Ambulatory Visit: Payer: Self-pay | Admitting: Family Medicine

## 2023-07-28 DIAGNOSIS — E038 Other specified hypothyroidism: Secondary | ICD-10-CM

## 2023-09-04 ENCOUNTER — Encounter: Payer: Self-pay | Admitting: Family Medicine

## 2023-10-14 ENCOUNTER — Encounter: Payer: Self-pay | Admitting: Family Medicine

## 2023-10-14 ENCOUNTER — Ambulatory Visit: Admitting: Family Medicine

## 2023-10-14 VITALS — BP 134/80 | HR 82 | Temp 97.9°F | Resp 16 | Ht 62.0 in | Wt 187.0 lb

## 2023-10-14 DIAGNOSIS — R062 Wheezing: Secondary | ICD-10-CM

## 2023-10-14 DIAGNOSIS — F418 Other specified anxiety disorders: Secondary | ICD-10-CM | POA: Diagnosis not present

## 2023-10-14 DIAGNOSIS — R232 Flushing: Secondary | ICD-10-CM

## 2023-10-14 DIAGNOSIS — R053 Chronic cough: Secondary | ICD-10-CM | POA: Diagnosis not present

## 2023-10-14 DIAGNOSIS — K219 Gastro-esophageal reflux disease without esophagitis: Secondary | ICD-10-CM

## 2023-10-14 MED ORDER — FAMOTIDINE 40 MG PO TABS
40.0000 mg | ORAL_TABLET | Freq: Every day | ORAL | 1 refills | Status: AC
Start: 2023-10-14 — End: ?

## 2023-10-14 MED ORDER — PANTOPRAZOLE SODIUM 40 MG PO TBEC: 40.0000 mg | DELAYED_RELEASE_TABLET | Freq: Every day | ORAL | 1 refills | Status: DC

## 2023-10-14 MED ORDER — BREZTRI AEROSPHERE 160-9-4.8 MCG/ACT IN AERO
2.0000 | INHALATION_SPRAY | Freq: Two times a day (BID) | RESPIRATORY_TRACT | 2 refills | Status: AC
Start: 2023-10-14 — End: ?

## 2023-10-14 NOTE — Progress Notes (Signed)
 ACUTE VISIT Chief Complaint  Patient presents with   Cough    Ongoing since October   HPI: Kristina [name] is a 73 y.o. female with a PMHx significant for HTN, migraine headaches, COPD, GERD, hypothyroidism, breast cancer, prediabetes, and anxiety, who is here today complaining of cough.   Patient complains of non-productive cough since 05/2023.  She endorses postnasal drainage, nausea, chills, wheezing, SOB, fatigue, and weird sensations in her ears and head.  Her wheezing is worse when lying down.  Most of her symptoms are worse in the morning. No known sleep apnea, she does not feel rested when she gets up.  Former smoker, COPD changes have been seen on imaging. Stiolto inh did not help with Symptoms, Albuterol inh does temporarily.   She has tried several allergy medications, saline nasal sprays, and a nasal decongestant with limited relief.  She smoked until 1991.  Pertinent negatives include fever, abnormal weight loss, hearing changes, difficulty swallowing, orthopnea, or PND.  She takes pantoprazole 40 mg for GERD. She was taking it twice daily for three months as recommended by gastroenterology but is now taking it once daily. She had an upper endoscopy and colonoscopy on 03/29/2023.  She also mentions she has had some night sweats/hot flashes since stopping Paroxetine. Effexor did not help, so she discontinued. Anxiety: She is on Alprazolam 0.25 mg bid prn..  Review of Systems  Constitutional:  Positive for fatigue. Negative for activity change and appetite change.  HENT:  Positive for congestion. Negative for ear discharge, ear pain, facial swelling and sore throat.   Eyes:  Negative for redness and visual disturbance.  Cardiovascular:  Negative for chest pain, palpitations and leg swelling.  Gastrointestinal:  Negative for abdominal pain and vomiting.  Endocrine: Negative for cold intolerance and heat intolerance.  Genitourinary:  Negative for decreased urine volume,  dysuria and hematuria.  Skin:  Negative for rash.  Neurological:  Negative for syncope, weakness and headaches.  Psychiatric/Behavioral:  Negative for confusion and hallucinations. The patient is nervous/anxious.   See other pertinent positives and negatives in HPI.  Current Outpatient Medications on File Prior to Visit  Medication Sig Dispense Refill   Acetaminophen (TYLENOL PO) Take 500 mg by mouth 2 (two) times daily.     albuterol (PROAIR HFA) 108 (90 Base) MCG/ACT inhaler Inhale 2 puffs into the lungs every 6 (six) hours as needed for wheezing or shortness of breath. 32 g 3   ALPRAZolam (XANAX) 0.25 MG tablet Take 1 tablet (0.25 mg total) by mouth 2 (two) times daily as needed for anxiety. 50 tablet 3   anastrozole (ARIMIDEX) 1 MG tablet Take 1 tablet (1 mg total) by mouth daily. 100 tablet 1   atorvastatin (LIPITOR) 10 MG tablet Take 1 tablet (10 mg total) by mouth daily. 90 tablet 2   azelaic acid (AZELEX) 20 % cream Apply topically 2 (two) times daily. After skin is thoroughly washed and patted dry, gently but thoroughly massage a thin film of azelaic acid cream into the affected area twice daily, in the morning and evening.     Calcium-Vitamin D-Vitamin K 500-100-40 MG-UNT-MCG CHEW Chew 1 tablet by mouth daily. chewable     Cholecalciferol (VITAMIN D3) 125 MCG (5000 UT) CAPS Take 1 capsule by mouth daily.     cyanocobalamin (VITAMIN B12) 500 MCG tablet Take 1,000 mcg by mouth daily.     cyclobenzaprine (FLEXERIL) 5 MG tablet Take 1 tablet (5 mg total) by mouth at bedtime. 90 tablet 1  levothyroxine (SYNTHROID) 25 MCG tablet TAKE 1 TABLET BY MOUTH ONCE DAILY BEFORE BREAKFAST 90 tablet 3   loratadine (CLARITIN) 10 MG tablet Take 10 mg by mouth daily.     Magnesium 250 MG TABS Take 1 tablet by mouth daily.     meclizine (ANTIVERT) 12.5 MG tablet Take 1 tablet (12.5 mg total) by mouth 3 (three) times daily as needed for dizziness. 60 tablet 1   Multiple Vitamin (MULTIVITAMIN) tablet Take  1 tablet by mouth daily.     PARoxetine (PAXIL) 10 MG tablet Take 10 mg by mouth daily.     propranolol (INDERAL) 40 MG tablet TAKE ONE TABLET BY MOUTH TWICE A DAY 180 tablet 2   RESTASIS 0.05 % ophthalmic emulsion Place 1 drop into both eyes 2 (two) times daily.     VOLTAREN 1 % GEL 2 g as needed.     No current facility-administered medications on file prior to visit.    Past Medical History:  Diagnosis Date   Actinic keratosis    Anxiety    Arthritis    Back pain    Breast cancer (HCC)    Cancer (HCC)    basal and squamous cell skin   Chicken pox    Chronic back pain    Complication of anesthesia    hard to wake up after lap cho-had to stay overnight-bp and hr dropped   COPD (chronic obstructive pulmonary disease) (HCC)    Depression    Dyspnea    due to copd   Gallbladder problem    GERD (gastroesophageal reflux disease)    Goiter    Hypertension    Hypothyroidism    Joint pain    Lactose intolerance    Migraines    Osteoarthritis    PONV (postoperative nausea and vomiting)    very nauseated   SOB (shortness of breath)    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, Mohs   Thyroid disease    Thyroid nodule    Tremor    Vitamin D deficiency    Allergies  Allergen Reactions   Sulfa Antibiotics Rash    Rash all over body/fever   Hydrocodone Nausea Only    Per patient    Meloxicam Other (See Comments)    "skin infection on leg"    Social History   Socioeconomic History   Marital status: Single    Spouse name: Not on file   Number of children: 0   Years of education: 12   Highest education level: Associate degree: occupational, Scientist, product/process development, or vocational program  Occupational History   Occupation: retired    Comment: administration  Tobacco Use   Smoking status: Former    Current packs/day: 0.00    Average packs/day: 1.5 packs/day for 25.0 years (37.5 ttl pk-yrs)    Types: Cigarettes    Start date: 03/03/1965    Quit date: 03/03/1990    Years since  quitting: 33.6   Smokeless tobacco: Never  Vaping Use   Vaping status: Never Used  Substance and Sexual Activity   Alcohol use: Not Currently   Drug use: No   Sexual activity: Never  Other Topics Concern   Not on file  Social History Narrative   Lives alone   Caffeine- tea, 1 cup   Social Drivers of Health   Financial Resource Strain: Medium Risk (10/11/2023)   Overall Financial Resource Strain (CARDIA)    Difficulty of Paying Living Expenses: Somewhat hard  Food Insecurity: No Food  Insecurity (10/11/2023)   Hunger Vital Sign    Worried About Running Out of Food in the Last Year: Never true    Ran Out of Food in the Last Year: Never true  Transportation Needs: No Transportation Needs (10/11/2023)   PRAPARE - Administrator, Civil Service (Medical): No    Lack of Transportation (Non-Medical): No  Physical Activity: Insufficiently Active (10/11/2023)   Exercise Vital Sign    Days of Exercise per Week: 1 day    Minutes of Exercise per Session: 10 min  Stress: Stress Concern Present (10/11/2023)   [employer] of Occupational Health - Occupational Stress Questionnaire    Feeling of Stress : Very much  Social Connections: Socially Isolated (10/11/2023)   Social Connection and Isolation Panel [NHANES]    Frequency of Communication with Friends and Family: More than three times a week    Frequency of Social Gatherings with Friends and Family: Once a week    Attends Religious Services: Never    Database administrator or Organizations: No    Attends Engineer, structural: Not on file    Marital Status: Divorced    Vitals:   10/14/23 1025  BP: 134/80  Pulse: 82  Resp: 16  Temp: 97.9 F (36.6 C)  SpO2: 97%   Body mass index is 34.2 kg/m.  Physical Exam Vitals and nursing note reviewed.  Constitutional:      General: She is not in acute distress.    Appearance: She is well-developed.  HENT:     Head: Normocephalic and atraumatic.     Right Ear: Ear canal  and external ear normal. A middle ear effusion is present. Tympanic membrane is not erythematous.     Left Ear: Ear canal and external ear normal. A middle ear effusion is present. Tympanic membrane is not erythematous.     Mouth/Throat:     Mouth: Mucous membranes are moist.     Pharynx: Oropharynx is clear. Uvula midline.  Eyes:     Conjunctiva/sclera: Conjunctivae normal.  Cardiovascular:     Rate and Rhythm: Normal rate and regular rhythm.     Heart sounds: No murmur heard. Pulmonary:     Effort: Pulmonary effort is normal. No respiratory distress.     Breath sounds: Wheezing (Left-sided) present. No rhonchi or rales.     Comments: . Abdominal:     Palpations: Abdomen is soft. There is no mass.     Tenderness: There is no abdominal tenderness.  Musculoskeletal:     Right lower leg: No edema.     Left lower leg: No edema.  Lymphadenopathy:     Cervical: No cervical adenopathy.  Skin:    General: Skin is warm.     Findings: No erythema or rash.  Neurological:     General: No focal deficit present.     Mental Status: She is alert and oriented to person, place, and time.     Cranial Nerves: No cranial nerve deficit.     Gait: Gait normal.  Psychiatric:        Mood and Affect: Affect normal. Mood is anxious.    ASSESSMENT AND PLAN:  Ms. Culhane was seen today for cough.   Chronic cough Problem has been going on since 05/2023. Some of her chronic medical conditions could be contributing factors. She has tried different treatments unsuccessfully. Negative CXR in 07/2023. Chest CT will be arranged and referral to pulmonologist placed (may need PFT and sleep study).  -  Ambulatory referral to Pulmonology -     CT CHEST WO CONTRAST; Future  Wheezing ? COPD, mild changes of COPD seen on CXR 03/2020. Samples of Breztri given today to start 2 puff bid.  -     Ambulatory referral to Pulmonology -     Breztri Aerosphere; Inhale 2 puffs into the lungs 2 (two) times daily.   Dispense: 10.7 g; Refill: 2 -     CT CHEST WO CONTRAST; Future  Hot flash not due to menopause Assessment & Plan: Having hat flashes and night sweats. Paroxetine 10 mg was helping, she has some at home and planning on resuming medication.  Gastroesophageal reflux disease, unspecified whether esophagitis present Assessment & Plan: Problem is not well controlled. Could be contributing to cough. Currently on Pantoprazole 40 mg daily. Agrees with adding Famotidine 40 mg at bedtime. Continue GERD precautions.  Orders: -     Famotidine; Take 1 tablet (40 mg total) by mouth at bedtime.  Dispense: 90 tablet; Refill: 1 -     Pantoprazole Sodium; Take 1 tablet (40 mg total) by mouth daily before breakfast.  Dispense: 90 tablet; Refill: 1  Other specified anxiety disorders Assessment & Plan: Resume Paroxetine 10 mg daily and continue Alprazolam 0.25 mg bid prn.  Possible eustachian tube dysfunction, recommend auto inflation maneuvers a few times per day. Monitor for hearing changes, May need EMT evaluation of symptoms are persistent.  Return in about 6 months (around 04/15/2024).  I, Rolla Etienne Wierda, acting as a scribe for Lamere Lightner [place], MD., have documented all relevant documentation on the behalf of Keri Veale [place], MD, as directed by  Sota Hetz [place], MD while in the presence of Larina Lieurance [place], MD.   I, Antanette Richwine [place], MD, have reviewed all documentation for this visit. The documentation on 10/15/23 for the exam, diagnosis, procedures, and orders are all accurate and complete.  Pattie Flaharty G. [place], MD  [facility]. [place] office.

## 2023-10-14 NOTE — Patient Instructions (Addendum)
 A few things to remember from today's visit:  Chronic cough - Plan: Ambulatory referral to Pulmonology, CT Chest Wo Contrast  Gastroesophageal reflux disease - Plan: famotidine (PEPCID) 40 MG tablet, pantoprazole (PROTONIX) 40 MG tablet  Wheezing - Plan: Ambulatory referral to Pulmonology, CT Chest Wo Contrast  Other specified anxiety disorders  Hot flash not due to menopause  Gastroesophageal reflux disease, unspecified whether esophagitis present  Resume Paroxetine 10 mg daily. No changes in Alprazolam. Protonix once daily. Add Famotidine 40 mg at bedtime.  Beztril 2 puff 2 times daily. Appt with pulmonologist will be arranged.   If you need refills for medications you take chronically, please call your pharmacy. Do not use My Chart to request refills or for acute issues that need immediate attention. If you send a my chart message, it may take a few days to be addressed, specially if I am not in the office.  Please be sure medication list is accurate. If a new problem present, please set up appointment sooner than planned today.

## 2023-10-15 NOTE — Assessment & Plan Note (Addendum)
 Resume Paroxetine 10 mg daily and continue Alprazolam 0.25 mg bid prn.

## 2023-10-15 NOTE — Assessment & Plan Note (Signed)
 Problem is not well controlled. Could be contributing to cough. Currently on Pantoprazole 40 mg daily. Agrees with adding Famotidine 40 mg at bedtime. Continue GERD precautions.

## 2023-10-15 NOTE — Assessment & Plan Note (Signed)
 Having hat flashes and night sweats. Paroxetine 10 mg was helping, she has some at home and planning on resuming medication.

## 2023-10-18 ENCOUNTER — Other Ambulatory Visit: Payer: Self-pay

## 2023-10-18 DIAGNOSIS — Z170 Estrogen receptor positive status [ER+]: Secondary | ICD-10-CM

## 2023-10-21 ENCOUNTER — Ambulatory Visit
Admission: RE | Admit: 2023-10-21 | Discharge: 2023-10-21 | Disposition: A | Discharge status: Home / Self Care | Source: Ambulatory Visit | Attending: Family Medicine | Admitting: Family Medicine

## 2023-10-21 DIAGNOSIS — R053 Chronic cough: Secondary | ICD-10-CM | POA: Diagnosis not present

## 2023-10-21 DIAGNOSIS — R062 Wheezing: Secondary | ICD-10-CM | POA: Insufficient documentation

## 2023-10-25 ENCOUNTER — Other Ambulatory Visit: Payer: Self-pay

## 2023-10-25 ENCOUNTER — Ambulatory Visit: Payer: PPO | Admitting: Dermatology

## 2023-10-25 ENCOUNTER — Encounter: Payer: Self-pay | Admitting: Dermatology

## 2023-10-25 DIAGNOSIS — L578 Other skin changes due to chronic exposure to nonionizing radiation: Secondary | ICD-10-CM

## 2023-10-25 DIAGNOSIS — D1801 Hemangioma of skin and subcutaneous tissue: Secondary | ICD-10-CM | POA: Diagnosis not present

## 2023-10-25 DIAGNOSIS — Z1283 Encounter for screening for malignant neoplasm of skin: Secondary | ICD-10-CM

## 2023-10-25 DIAGNOSIS — Z85828 Personal history of other malignant neoplasm of skin: Secondary | ICD-10-CM

## 2023-10-25 DIAGNOSIS — L814 Other melanin hyperpigmentation: Secondary | ICD-10-CM | POA: Diagnosis not present

## 2023-10-25 DIAGNOSIS — Z7189 Other specified counseling: Secondary | ICD-10-CM | POA: Diagnosis not present

## 2023-10-25 DIAGNOSIS — L821 Other seborrheic keratosis: Secondary | ICD-10-CM

## 2023-10-25 DIAGNOSIS — W908XXA Exposure to other nonionizing radiation, initial encounter: Secondary | ICD-10-CM | POA: Diagnosis not present

## 2023-10-25 DIAGNOSIS — L719 Rosacea, unspecified: Secondary | ICD-10-CM | POA: Diagnosis not present

## 2023-10-25 DIAGNOSIS — L858 Other specified epidermal thickening: Secondary | ICD-10-CM | POA: Diagnosis not present

## 2023-10-25 DIAGNOSIS — Z79899 Other long term (current) drug therapy: Secondary | ICD-10-CM

## 2023-10-25 DIAGNOSIS — D229 Melanocytic nevi, unspecified: Secondary | ICD-10-CM

## 2023-10-25 DIAGNOSIS — I781 Nevus, non-neoplastic: Secondary | ICD-10-CM | POA: Diagnosis not present

## 2023-10-25 DIAGNOSIS — L820 Inflamed seborrheic keratosis: Secondary | ICD-10-CM | POA: Diagnosis not present

## 2023-10-25 DIAGNOSIS — L570 Actinic keratosis: Secondary | ICD-10-CM

## 2023-10-25 DIAGNOSIS — E7849 Other hyperlipidemia: Secondary | ICD-10-CM

## 2023-10-25 MED ORDER — ATORVASTATIN CALCIUM 10 MG PO TABS: 10.0000 mg | ORAL_TABLET | Freq: Every day | ORAL | 2 refills | Status: DC

## 2023-10-25 NOTE — Patient Instructions (Addendum)
 Cryotherapy Aftercare  Wash gently with soap and water everyday.   Apply Vaseline and Band-Aid daily until healed.   - Start 5-fluorouracil/calcipotriene cream twice a day for 5-7 days to affected areas including forehead, nose, cheeks, left lower neck. May need to treat left lower neck up to 2 weeks. If right lower neck not clear with cryotherapy, treat with topical cream also. Prescription sent to [facility]. Patient advised they will receive an email to purchase the medication online and have it sent to their home. Patient provided with handout reviewing treatment course and side effects and advised to call or message [place] on MyChart with any concerns.  Instructions for [facility] Medications  One or more of your medications was sent to the [facility] mail order [facility]. You will receive an email from them and can purchase the medicine through that link. It will then be mailed to your home at the address you confirmed. If for any reason you do not receive an email from them, please check your spam folder. If you still do not find the email, please let us know. [facility] phone number is [phone].   Reviewed course of treatment and expected reaction.  Patient advised to expect inflammation and crusting and advised that erosions are possible.  Patient advised to be diligent with sun protection during and after treatment. Counseled to keep medication out of reach of children and pets.  5-Fluorouracil/Calcipotriene Patient Education   Actinic keratoses are the dry, red scaly spots on the skin caused by sun damage. A portion of these spots can turn into skin cancer with time, and treating them can help prevent development of skin cancer.   Treatment of these spots requires removal of the defective skin cells. There are various ways to remove actinic keratoses, including freezing with liquid nitrogen, treatment with creams, or treatment with a  blue light procedure in the office.   5-fluorouracil cream is a topical cream used to treat actinic keratoses. It works by interfering with the growth of abnormal fast-growing skin cells, such as actinic keratoses. These cells peel off and are replaced by healthy ones.   5-fluorouracil/calcipotriene is a combination of the 5-fluorouracil cream with a vitamin D analog cream called calcipotriene. The calcipotriene alone does not treat actinic keratoses. However, when it is combined with 5-fluorouracil, it helps the 5-fluorouracil treat the actinic keratoses much faster so that the same results can be achieved with a much shorter treatment time.  INSTRUCTIONS FOR 5-FLUOROURACIL/CALCIPOTRIENE CREAM:   5-fluorouracil/calcipotriene cream typically only needs to be used for 4-7 days. A thin layer should be applied twice a day to the treatment areas recommended by your physician.   If your physician prescribed you separate tubes of 5-fluourouracil and calcipotriene, apply a thin layer of 5-fluorouracil followed by a thin layer of calcipotriene.   Avoid contact with your eyes, nostrils, and mouth. Do not use 5-fluorouracil/calcipotriene cream on infected or open wounds.   You will develop redness, irritation and some crusting at areas where you have pre-cancer damage/actinic keratoses. IF YOU DEVELOP PAIN, BLEEDING, OR SIGNIFICANT CRUSTING, STOP THE TREATMENT EARLY - you have already gotten a good response and the actinic keratoses should clear up well.  Wash your hands after applying 5-fluorouracil 5% cream on your skin.   A moisturizer or sunscreen with a minimum SPF 30 should be applied each morning.   Once you have finished the treatment, you can apply a thin layer of Vaseline twice a day to  irritated areas to soothe and calm the areas more quickly. If you experience significant discomfort, contact your physician.  For some patients it is necessary to repeat the treatment for best results.  SIDE  EFFECTS: When using 5-fluorouracil/calcipotriene cream, you may have mild irritation, such as redness, dryness, swelling, or a mild burning sensation. This usually resolves within 2 weeks. The more actinic keratoses you have, the more redness and inflammation you can expect during treatment. Eye irritation has been reported rarely. If this occurs, please let us know.  If you have any trouble using this cream, please call the office. If you have any other questions about this information, please do not hesitate to ask me before you leave the office.  Recommend starting moisturizer with exfoliant (Urea, Salicylic acid, or Lactic acid) one to two times daily to help smooth rough and bumpy skin.  OTC options include Cetaphil Rough and Bumpy lotion (Urea), Eucerin Roughness Relief lotion or spot treatment cream (Urea), CeraVe SA lotion/cream for Rough and Bumpy skin (Sal Acid), Gold Bond Rough and Bumpy cream (Sal Acid), and AmLactin 12% lotion/cream (Lactic Acid).  If applying in morning, also apply sunscreen to sun-exposed areas, since these exfoliating moisturizers can increase sensitivity to sun.   Due to recent changes in healthcare laws, you may see results of your pathology and/or laboratory studies on MyChart before the doctors have had a chance to review them. We understand that in some cases there may be results that are confusing or concerning to you. Please understand that not all results are received at the same time and often the doctors may need to interpret multiple results in order to provide you with the best plan of care or course of treatment. Therefore, we ask that you please give [place] 2 business days to thoroughly review all your results before contacting the office for clarification. Should we see a critical lab result, you will be contacted sooner.   If You Need Anything After Your Visit  If you have any questions or concerns for your doctor, please call our main line at [phone] and  press option 4 to reach your doctor's medical assistant. If no one answers, please leave a voicemail as directed and we will return your call as soon as possible. Messages left after 4 pm will be answered the following business day.   You may also send [place] a message via MyChart. We typically respond to MyChart messages within 1-2 business days.  For prescription refills, please ask your pharmacy to contact our office. Our fax number is [phone].  If you have an urgent issue when the clinic is closed that cannot wait until the next business day, you can page your doctor at the number below.    Please note that while we do our best to be available for urgent issues outside of office hours, we are not available 24/7.   If you have an urgent issue and are unable to reach [place], you may choose to seek medical care at your doctor's office, retail clinic, urgent care center, or emergency room.  If you have a medical emergency, please immediately call 911 or go to the emergency department.  Pager Numbers  - Dr. Gwen Pounds: [phone]  - Dr. Roseanne Reno: [phone]  - Dr. Katrinka Blazing: [phone]   In the event of inclement weather, please call our main line at [phone] for an update on the status of any delays or closures.  Dermatology Medication Tips: Please keep the boxes that topical medications come  in in order to help keep track of the instructions about where and how to use these. Pharmacies typically print the medication instructions only on the boxes and not directly on the medication tubes.   If your medication is too expensive, please contact our office at [phone] option 4 or send [place] a message through MyChart.   We are unable to tell what your co-pay for medications will be in advance as this is different depending on your insurance coverage. However, we may be able to find a substitute medication at lower cost or fill out paperwork to get insurance to cover a needed medication.    If a prior authorization is required to get your medication covered by your insurance company, please allow [place] 1-2 business days to complete this process.  Drug prices often vary depending on where the prescription is filled and some pharmacies may offer cheaper prices.  The website www.goodrx.com contains coupons for medications through different pharmacies. The prices here do not account for what the cost may be with help from insurance (it may be cheaper with your insurance), but the website can give you the price if you did not use any insurance.  - You can print the associated coupon and take it with your prescription to the pharmacy.  - You may also stop by our office during regular business hours and pick up a GoodRx coupon card.  - If you need your prescription sent electronically to a different pharmacy, notify our office through [facility] or by phone at [phone] option 4.     Si Usted Necesita Algo Despus de Su Visita  Tambin puede enviarnos un mensaje a travs de Clinical cytogeneticist. Por lo general respondemos a los mensajes de MyChart en el transcurso de 1 a 2 das hbiles.  Para renovar recetas, por favor pida a su farmacia que se ponga en contacto con nuestra oficina. Annie Sable de fax es [place] [phone].  Si tiene un asunto urgente cuando la clnica est cerrada y que no puede esperar hasta el siguiente da hbil, puede llamar/localizar a su doctor(a) al nmero que aparece a continuacin.   Por favor, tenga en cuenta que aunque hacemos todo lo posible para estar disponibles para asuntos urgentes fuera del horario de [place], no estamos disponibles las 24 horas del da, los 7 [address]  [address] de la [place].   Si tiene un problema urgente y no puede comunicarse con nosotros, puede optar por buscar atencin mdica  en el consultorio de su doctor(a), en una clnica privada, en un centro de atencin urgente o en una sala de emergencias.  Si tiene Engineer, drilling, por favor  llame inmediatamente al 911 o vaya a la sala de emergencias.  Nmeros de bper  - Dr. Gwen Pounds: [phone]  - Dra. Roseanne Reno: [phone]  - Dr. Katrinka Blazing: [phone]   En caso de inclemencias del tiempo, por favor llame a Lacy Duverney principal al [phone] para una actualizacin sobre el [place] de cualquier retraso o cierre.  Consejos para la medicacin en dermatologa: Por favor, guarde las cajas en las que vienen los medicamentos de uso tpico para ayudarle a seguir las instrucciones sobre dnde y cmo usarlos. Las farmacias generalmente imprimen las instrucciones del medicamento slo en las cajas y no directamente en los tubos del [place].   Si su medicamento es muy caro, por favor, pngase en contacto con Rolm Gala llamando al [phone] y presione la opcin 4 o envenos un mensaje a travs de Clinical cytogeneticist.   No podemos decirle  cul ser su copago por los medicamentos por adelantado ya que esto es diferente dependiendo de la cobertura de su seguro. Sin embargo, es posible que podamos encontrar un medicamento sustituto a Audiological scientist un formulario para que el seguro cubra el medicamento que se considera necesario.   Si se requiere una autorizacin previa para que su compaa de seguros [place] su medicamento, por favor permtanos de 1 a 2 das hbiles para completar [address]  Los precios de los medicamentos varan con frecuencia dependiendo del Environmental consultant de dnde se surte la receta y alguna farmacias pueden ofrecer precios ms baratos.  El sitio web www.goodrx.com tiene cupones para medicamentos de Health and safety inspector. Los precios aqu no tienen en cuenta lo que podra costar con la ayuda del seguro (puede ser ms barato con su seguro), pero el sitio web puede darle el precio si no utiliz Tourist information centre manager.  - Puede imprimir el cupn correspondiente y llevarlo con su receta a la farmacia.  - Tambin puede pasar por nuestra oficina durante el horario de atencin regular y  Education officer, museum una tarjeta de cupones de GoodRx.  - Si necesita que su receta se enve electrnicamente a una farmacia diferente, informe a nuestra oficina a travs de MyChart de [place] o por telfono llamando al [phone] y presione la opcin 4.

## 2023-10-25 NOTE — Progress Notes (Signed)
 Follow-Up Visit   Subjective  Kristina [name] is a 73 y.o. female who presents for the following: Skin Cancer Screening and Full Body Skin Exam  The patient presents for Total-Body Skin Exam (TBSE) for skin cancer screening and mole check. The patient has spots, moles and lesions to be evaluated, some may be new or changing, including forehead, shoulders, and back. History of BCC/SCC. History of Aks, has treated with Carac Cream in the past. Couple of itchy spots on back and shoulder.   The following portions of the chart were reviewed this encounter and updated as appropriate: medications, allergies, medical history  Review of Systems:  No other skin or systemic complaints except as noted in HPI or Assessment and Plan.  Objective  Well appearing patient in no apparent distress; mood and affect are within normal limits.  A full examination was performed including scalp, head, eyes, ears, nose, lips, neck, chest, axillae, abdomen, back, buttocks, bilateral upper extremities, bilateral lower extremities, hands, feet, fingers, toes, fingernails, and toenails. All findings within normal limits unless otherwise noted below.   Relevant physical exam findings are noted in the Assessment and Plan.  R med shoulder x 1, spinal lower back x 1 (2) Erythematous stuck-on, waxy papule L post shoulder x 2, mid forehead x 2, L forearm x 1, R lower neck adjacent to scar x 1, L nasal root (not treated today) (6) Pink scaly macules.  Assessment & Plan   SKIN CANCER SCREENING PERFORMED TODAY.  ACTINIC DAMAGE WITH PRECANCEROUS ACTINIC KERATOSES Counseling for Topical Chemotherapy Management: Patient exhibits: - Severe, confluent actinic changes with pre-cancerous actinic keratoses that is secondary to cumulative UV radiation exposure over time - Condition that is severe; chronic, not at goal. - diffuse scaly erythematous macules and papules with underlying dyspigmentation - Discussed Prescription  "Field Treatment" topical Chemotherapy for Severe, Chronic Confluent Actinic Changes with Pre-Cancerous Actinic Keratoses Field treatment involves treatment of an entire area of skin that has confluent Actinic Changes (Sun/ Ultraviolet light damage) and PreCancerous Actinic Keratoses by method of PhotoDynamic Therapy (PDT) and/or prescription Topical Chemotherapy agents such as 5-fluorouracil, 5-fluorouracil/calcipotriene, and/or imiquimod.  The purpose is to decrease the number of clinically evident and subclinical PreCancerous lesions to prevent progression to development of skin cancer by chemically destroying early precancer changes that may or may not be visible.  It has been shown to reduce the risk of developing skin cancer in the treated area. As a result of treatment, redness, scaling, crusting, and open sores may occur during treatment course. One or more than one of these methods may be used and may have to be used several times to control, suppress and eliminate the PreCancerous changes. Discussed treatment course, expected reaction, and possible side effects. - Recommend daily broad spectrum sunscreen SPF 30+ to sun-exposed areas, reapply every 2 hours as needed.  - Staying in the shade or wearing long sleeves, sun glasses (UVA+UVB protection) and wide brim hats (4-inch brim around the entire circumference of the hat) are also recommended. - Call for new or changing lesions. - Start 5-fluorouracil/calcipotriene cream twice a day for 5-7 days to affected areas including forehead, nose, cheeks. Prescription sent to [facility]. Patient advised they will receive an email to purchase the medication online and have it sent to their home. Patient provided with handout reviewing treatment course and side effects and advised to call or message [place] on MyChart with any concerns.  Reviewed course of treatment and expected reaction.  Patient  advised to expect inflammation and crusting  and advised that erosions are possible.  Patient advised to be diligent with sun protection during and after treatment. Counseled to keep medication out of reach of children and pets.  LENTIGINES, SEBORRHEIC KERATOSES, HEMANGIOMAS - Benign normal skin lesions - Benign-appearing - Call for any changes  MELANOCYTIC NEVI - Tan-brown and/or pink-flesh-colored symmetric macules and papules - Benign appearing on exam today - Observation - Call clinic for new or changing moles - Recommend daily use of broad spectrum spf 30+ sunscreen to sun-exposed areas.   HISTORY OF SQUAMOUS CELL CARCINOMA OF THE SKIN - No evidence of recurrence today - No lymphadenopathy - Recommend regular full body skin exams - Recommend daily broad spectrum sunscreen SPF 30+ to sun-exposed areas, reapply every 2 hours as needed.  - Call if any new or changing lesions are noted between office visits  HISTORY OF BASAL CELL CARCINOMA OF THE SKIN - No evidence of recurrence today - Recommend regular full body skin exams - Recommend daily broad spectrum sunscreen SPF 30+ to sun-exposed areas, reapply every 2 hours as needed.  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 No cure. - Observe. - If desired, patient can use an emollient (moisturizer) containing ammonium lactate (AmLactin), urea or salicylic acid once a day to smooth the area  Recommend starting moisturizer with exfoliant (Urea, Salicylic acid, or Lactic acid) one to two times daily to help smooth rough and bumpy skin.  OTC options include Cetaphil Rough and Bumpy lotion (Urea), Eucerin Roughness Relief lotion or spot treatment cream (Urea), CeraVe SA lotion/cream for Rough and Bumpy skin (Sal Acid), Gold Bond Rough and Bumpy cream (Sal Acid), and AmLactin 12% lotion/cream (Lactic Acid).  If applying in morning, also apply sunscreen to sun-exposed areas,  since these exfoliating moisturizers can increase sensitivity to sun.   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 Currently well-controlled.   Rosacea is a chronic progressive skin condition usually affecting the face of adults, causing redness and/or acne bumps. It is treatable but not curable. It sometimes affects the eyes (ocular rosacea) as well. It may respond to topical and/or systemic medication and can flare with stress, sun exposure, alcohol, exercise, topical steroids (including hydrocortisone/cortisone 10) and some foods.  Daily application of broad spectrum spf 30+ sunscreen to face is recommended to reduce flares.   Treatment Plan Continue Azelaic acid gel. Pt has at home, will call for refills.   TELANGIECTASIA Exam: dilated blood vessel at right posterior upper arm, blanches  Treatment Plan: Benign appearing on exam Call for changes  SCAR vs EARLY BCC Exam: L lower neck 10 x 4 mm light pink macule, no scale     Discussed biopsy, but patient defers today. She prefers to use topical first - Start 5FU/Calcipotriene cream BID x up to 2 weeks.   INFLAMED SEBORRHEIC KERATOSIS (2) R med shoulder x 1, spinal lower back x 1 (2) vs AKs.  Symptomatic, irritating, patient would like treated. Destruction of lesion - R med shoulder x 1, spinal lower back x 1 (2)  Destruction method: cryotherapy   Informed consent: discussed and consent obtained   Lesion destroyed using liquid nitrogen: Yes   Region frozen until ice ball extended beyond lesion: Yes   Outcome: patient tolerated procedure well with no complications   Post-procedure  details: wound care instructions given   Additional details:  Prior to procedure, discussed risks of blister formation, small wound, skin dyspigmentation, or rare scar following cryotherapy. Recommend Vaseline ointment to treated areas while healing.  AK (ACTINIC  KERATOSIS) (6) L post shoulder x 2, mid forehead x 2, L forearm x 1, R lower neck adjacent to scar x 1, L nasal root (not treated today) (6) vs ISK.  Pt defers cryotherapy to R nasal root- she will treat R nasal root with 5FU/Calcipotriene cream. If R lower neck doesn't clear with cryotherapy, will treat with 5FU/Calcipotriene.  Actinic keratoses are precancerous spots that appear secondary to cumulative UV radiation exposure/sun exposure over time. They are chronic with expected duration over 1 year. A portion of actinic keratoses will progress to squamous cell carcinoma of the skin. It is not possible to reliably predict which spots will progress to skin cancer and so treatment is recommended to prevent development of skin cancer.  Recommend daily broad spectrum sunscreen SPF 30+ to sun-exposed areas, reapply every 2 hours as needed.  Recommend staying in the shade or wearing long sleeves, sun glasses (UVA+UVB protection) and wide brim hats (4-inch brim around the entire circumference of the hat). Call for new or changing lesions. Destruction of lesion - L post shoulder x 2, mid forehead x 2, L forearm x 1, R lower neck adjacent to scar x 1, L nasal root (not treated today) (6)  Destruction method: cryotherapy   Informed consent: discussed and consent obtained   Lesion destroyed using liquid nitrogen: Yes   Region frozen until ice ball extended beyond lesion: Yes   Outcome: patient tolerated procedure well with no complications   Post-procedure details: wound care instructions given   Additional details:  Prior to procedure, discussed risks of blister formation, small wound, skin dyspigmentation, or rare scar following cryotherapy. Recommend Vaseline ointment to treated areas while healing.  Return in about 6 months (around 04/26/2024) for AKs, recheck nose, neck.  ICherlyn Labella, CMA, am acting as scribe for Willeen Niece, MD .   Documentation: I have reviewed the above documentation for  accuracy and completeness, and I agree with the above.  Willeen Niece, MD

## 2023-10-26 ENCOUNTER — Other Ambulatory Visit: Payer: Self-pay

## 2023-10-26 ENCOUNTER — Encounter: Payer: Self-pay | Admitting: Family Medicine

## 2023-10-26 ENCOUNTER — Ambulatory Visit
Admission: RE | Admit: 2023-10-26 | Discharge: 2023-10-26 | Disposition: A | Discharge status: Home / Self Care | Payer: PPO | Source: Ambulatory Visit | Attending: Radiation Oncology | Admitting: Radiation Oncology

## 2023-10-26 ENCOUNTER — Encounter: Payer: Self-pay | Admitting: Radiation Oncology

## 2023-10-26 VITALS — BP 128/78 | HR 72 | Temp 97.6°F | Resp 16 | Wt 187.0 lb

## 2023-10-26 DIAGNOSIS — Z79811 Long term (current) use of aromatase inhibitors: Secondary | ICD-10-CM | POA: Diagnosis not present

## 2023-10-26 DIAGNOSIS — Z170 Estrogen receptor positive status [ER+]: Secondary | ICD-10-CM | POA: Diagnosis not present

## 2023-10-26 DIAGNOSIS — C50211 Malignant neoplasm of upper-inner quadrant of right female breast: Secondary | ICD-10-CM | POA: Insufficient documentation

## 2023-10-26 DIAGNOSIS — Z923 Personal history of irradiation: Secondary | ICD-10-CM | POA: Insufficient documentation

## 2023-10-26 NOTE — Progress Notes (Signed)
 Radiation Oncology Follow up Note  Name: Kristina [name]   Date:   10/26/2023 MRN:  [id number] DOB: Dec 23, 1950    This 73 y.o. female presents to the clinic today for 56-month follow-up status post whole breast radiation to her right breast for stage Ia ER/PR positive invasive mammary carcinoma.  REFERRING PROVIDER: [place], Betty G, MD  HPI: Patient is a 73 year old female now out 13 months having completed whole breast radiation to her right breast for stage Ia ER/PR positive invasive mammary carcinoma.  Seen today in routine follow-up she is doing well.  She specifically denies breast tenderness cough or bone pain.  She has tweaked her back today she does have a history of back pain and back issues..  She had mammograms back in September which I have reviewed were BI-RADS 2 benign.  She also had a CT scan of her chest this week which has not been formally read but on my review shows no evidence of lung pathology or any other distinct pathologic findings.  She is currently on Arimidex tolerating that well without side effect  COMPLICATIONS OF TREATMENT: none  FOLLOW UP COMPLIANCE: keeps appointments   PHYSICAL EXAM:  BP 128/78   Pulse 72   Temp 97.6 F (36.4 C)   Resp 16   Wt 187 lb (84.8 kg)   LMP  (LMP Unknown)   BMI 34.20 kg/m  Lungs are clear to A&P cardiac examination essentially unremarkable with regular rate and rhythm. No dominant mass or nodularity is noted in either breast in 2 positions examined. Incision is well-healed. No axillary or supraclavicular adenopathy is appreciated. Cosmetic result is excellent.  Well-developed well-nourished patient in NAD. HEENT reveals PERLA, EOMI, discs not visualized.  Oral cavity is clear. No oral mucosal lesions are identified. Neck is clear without evidence of cervical or supraclavicular adenopathy. Lungs are clear to A&P. Cardiac examination is essentially unremarkable with regular rate and rhythm without murmur rub or thrill. Abdomen is  benign with no organomegaly or masses noted. Motor sensory and DTR levels are equal and symmetric in the upper and lower extremities. Cranial nerves II through XII are grossly intact. Proprioception is intact. No peripheral adenopathy or edema is identified. No motor or sensory levels are noted. Crude visual fields are within normal range.  RADIOLOGY RESULTS: CT scan and mammograms reviewed compatible with above-stated findings  PLAN: Present time patient is doing well no evidence of disease now out 13 months.  Of asked to see her back in 1 year for follow-up.  She continues on Arimidex without side effect.  Patient knows to call with any concerns.  She continues close follow-up care with medical oncology.  I would like to take this opportunity to thank you for allowing me to participate in the care of your patient.Carmina Miller, MD

## 2023-10-27 ENCOUNTER — Other Ambulatory Visit: Payer: Self-pay

## 2023-10-29 ENCOUNTER — Encounter: Payer: Self-pay | Admitting: Family Medicine

## 2023-11-07 ENCOUNTER — Encounter: Payer: Self-pay | Admitting: Pulmonary Disease

## 2023-11-07 ENCOUNTER — Ambulatory Visit: Admitting: Pulmonary Disease

## 2023-11-07 VITALS — BP 114/70 | HR 80 | Resp 14 | Ht 62.0 in | Wt 186.8 lb

## 2023-11-07 DIAGNOSIS — R062 Wheezing: Secondary | ICD-10-CM | POA: Diagnosis not present

## 2023-11-07 LAB — POCT EXHALED NITRIC OXIDE: FeNO level (ppb): 48

## 2023-11-07 MED ORDER — FLUTICASONE PROPIONATE 50 MCG/ACT NA SUSP
1.0000 | Freq: Every day | NASAL | 2 refills | Status: DC
Start: 2023-11-07 — End: 2024-07-02

## 2023-11-07 MED ORDER — FLUTICASONE-SALMETEROL 250-50 MCG/ACT IN AEPB
1.0000 | INHALATION_SPRAY | Freq: Two times a day (BID) | RESPIRATORY_TRACT | 3 refills | Status: DC
Start: 2023-11-07 — End: 2024-01-16

## 2023-11-07 NOTE — Progress Notes (Signed)
 Synopsis: Referred in by [place], Betty G, MD   Subjective:   PATIENT ID: Kristina [name] GENDER: female DOB: Feb 04, 1951, MRN: [id number]  Chief Complaint  Patient presents with   New Patient (Initial Visit)    Chronic cough since October it is unchanged. Comes and goes, worse in am and late evening. Dry cough. Wheezing x 1 year. More when exhale. She states it all seems like in throat more than chest.      HPI Kristina [name] is a pleasant 73 years old female patient with a past medical history of hyperlipidemia, hypothyroidism, anxiety, breast cancer in 2023 s/p lumpectomy, radiation and currently on Arimidex  presenting today to the pulmonary clinic for ongoing shortness of breath and wheezing.   She reports that since October she started having persistent cough described as coughing spells.  Unsure what triggers it.  She reports that she does not have GERD and after her endoscopy she was placed on high-dose PPI which did not help.  In October they tapered her to 1 time daily and she noticed her symptoms coming back.  She does report wheezing mostly at night when she lays flat.  Furthermore she does report hypersensitivity to strong scents.  She denies any shortness of breath.  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  CT chest 10/2023 without any signs of ILD.   Family history -father with lung cancer who was a heavy smoker.  Social history -she smoked 1 pack/day for for about 25 years she quit in 1992.  She lives at home alone and has 1 cat.  She has no kids.  She worked as an Research scientist (medical) but currently retired.  ROS All symptoms were reviewed and are negative except for the above.  Objective:   Vitals:   11/07/23 0846  BP: 114/70  Pulse: 80  Resp: 14  SpO2: 98%  Weight: 186 lb 12.8 oz (84.7 kg)  Height: 5\' 2"  (1.575 m)   98% on RA BMI Readings from Last 3 Encounters:  11/07/23 34.17 kg/m  10/26/23 34.20 kg/m   10/14/23 34.20 kg/m   Wt Readings from Last 3 Encounters:  11/07/23 186 lb 12.8 oz (84.7 kg)  10/26/23 187 lb (84.8 kg)  10/14/23 187 lb (84.8 kg)    Physical Exam GEN: NAD, Healthy Appearing HEENT: Supple Neck, Reactive Pupils, EOMI, Erythematous nasal mucosae.  CVS: Normal S1, Normal S2, RRR, No murmurs or ES appreciated  Lungs: Clear bilateral air entry.  Abdomen: Soft, non tender, non distended, + BS  Extremities: Warm and well perfused, No edema  Skin: No suspicious lesions appreciated  Psych: Normal Affect  Ancillary Information   CBC    Component Value Date/Time   WBC 7.0 06/27/2023 0958   WBC 6.5 09/06/2022 0957   RBC 4.05 06/27/2023 0958   HGB 13.0 06/27/2023 0958   HGB 12.5 02/07/2023 1344   HCT 38.7 06/27/2023 0958   HCT 38.9 02/07/2023 1344   PLT 262 06/27/2023 0958   PLT 235 02/07/2023 1344   MCV 95.6 06/27/2023 0958   MCV 94 02/07/2023 1344   MCH 32.1 06/27/2023 0958   MCHC 33.6 06/27/2023 0958   RDW 12.4 06/27/2023 0958   RDW 12.8 02/07/2023 1344   LYMPHSABS 1.2 06/27/2023 0958   MONOABS 0.7 06/27/2023 0958   EOSABS 0.4 06/27/2023 0958   BASOSABS 0.1 06/27/2023 0958   Labs and imaging were reviewed.      No data to display  Assessment & Plan:  Kristina [name] is a pleasant 73 years old female patient with a past medical history of hyperlipidemia, hypothyroidism, anxiety, breast cancer in 2023 s/p lumpectomy, radiation and currently on Arimidex  presenting today to the pulmonary clinic for ongoing shortness of breath and wheezing.   #Chronic cough  Impression that this is multifactorial in the setting of GERD and adult onset asthma with some component of post nasal drip.  FENO 48 EOS 400   []  PFTs  []  Start ICS-LABA with fluticasone-Salmeterol [Wixela] 250-50 1 puff BID. Advised mouth rinsing after each use.  []  Start Albuterol on an as needed basis.  []  sTart Flonase 1spray each nostril twice a day.   #GERD  Currently on Protonix  40mg  PO daily and Famotidine 40mg  PO daily. Advised on lifestyle modifications.   Return in about 3 months (around 02/06/2024).  I spent 60 minutes caring for this patient today, including preparing to see the patient, obtaining a medical history , reviewing a separately obtained history, performing a medically appropriate examination and/or evaluation, counseling and educating the patient/family/caregiver, ordering medications, tests, or procedures, documenting clinical information in the electronic health record, and independently interpreting results (not separately reported/billed) and communicating results to the patient/family/caregiver  Janann Colonel, MD [facility] 11/07/2023 9:23 AM

## 2023-11-09 ENCOUNTER — Encounter: Payer: Self-pay | Admitting: Dermatology

## 2023-11-22 ENCOUNTER — Other Ambulatory Visit

## 2023-11-22 ENCOUNTER — Encounter

## 2023-11-29 ENCOUNTER — Encounter: Payer: Self-pay | Admitting: Dermatology

## 2023-12-01 ENCOUNTER — Ambulatory Visit: Admitting: Surgery

## 2023-12-22 ENCOUNTER — Ambulatory Visit: Admitting: Pulmonary Disease

## 2023-12-28 ENCOUNTER — Other Ambulatory Visit: Payer: PPO

## 2023-12-28 ENCOUNTER — Ambulatory Visit: Payer: PPO | Admitting: Oncology

## 2024-01-16 ENCOUNTER — Other Ambulatory Visit: Payer: Self-pay

## 2024-01-16 DIAGNOSIS — R062 Wheezing: Secondary | ICD-10-CM

## 2024-01-16 MED ORDER — FLUTICASONE-SALMETEROL 250-50 MCG/ACT IN AEPB: 1.0000 | INHALATION_SPRAY | Freq: Two times a day (BID) | RESPIRATORY_TRACT | 3 refills | Status: DC

## 2024-01-17 ENCOUNTER — Other Ambulatory Visit

## 2024-01-17 ENCOUNTER — Ambulatory Visit: Admitting: Oncology

## 2024-01-23 ENCOUNTER — Other Ambulatory Visit: Payer: Self-pay | Admitting: Family Medicine

## 2024-01-23 DIAGNOSIS — G4700 Insomnia, unspecified: Secondary | ICD-10-CM

## 2024-01-23 DIAGNOSIS — F418 Other specified anxiety disorders: Secondary | ICD-10-CM

## 2024-01-23 NOTE — Telephone Encounter (Signed)
 Last OV: 10/2023 Last Filled: 11/23/23 Next OV: not scheduled

## 2024-01-25 ENCOUNTER — Inpatient Hospital Stay: Admitting: Oncology

## 2024-01-25 ENCOUNTER — Inpatient Hospital Stay: Attending: Oncology

## 2024-01-25 ENCOUNTER — Encounter: Payer: Self-pay | Admitting: Oncology

## 2024-01-25 VITALS — BP 142/78 | HR 70 | Temp 96.6°F | Resp 18 | Wt 188.8 lb

## 2024-01-25 DIAGNOSIS — Z9221 Personal history of antineoplastic chemotherapy: Secondary | ICD-10-CM | POA: Insufficient documentation

## 2024-01-25 DIAGNOSIS — Z90710 Acquired absence of both cervix and uterus: Secondary | ICD-10-CM | POA: Diagnosis not present

## 2024-01-25 DIAGNOSIS — C50211 Malignant neoplasm of upper-inner quadrant of right female breast: Secondary | ICD-10-CM

## 2024-01-25 DIAGNOSIS — Z1732 Human epidermal growth factor receptor 2 negative status: Secondary | ICD-10-CM | POA: Diagnosis not present

## 2024-01-25 DIAGNOSIS — Z1721 Progesterone receptor positive status: Secondary | ICD-10-CM | POA: Diagnosis not present

## 2024-01-25 DIAGNOSIS — Z87891 Personal history of nicotine dependence: Secondary | ICD-10-CM | POA: Insufficient documentation

## 2024-01-25 DIAGNOSIS — Z170 Estrogen receptor positive status [ER+]: Secondary | ICD-10-CM | POA: Diagnosis not present

## 2024-01-25 DIAGNOSIS — C50911 Malignant neoplasm of unspecified site of right female breast: Secondary | ICD-10-CM | POA: Diagnosis not present

## 2024-01-25 DIAGNOSIS — Z79811 Long term (current) use of aromatase inhibitors: Secondary | ICD-10-CM | POA: Diagnosis not present

## 2024-01-25 DIAGNOSIS — Z923 Personal history of irradiation: Secondary | ICD-10-CM | POA: Insufficient documentation

## 2024-01-25 DIAGNOSIS — Z8051 Family history of malignant neoplasm of kidney: Secondary | ICD-10-CM | POA: Insufficient documentation

## 2024-01-25 DIAGNOSIS — M8580 Other specified disorders of bone density and structure, unspecified site: Secondary | ICD-10-CM

## 2024-01-25 LAB — CBC WITH DIFFERENTIAL (CANCER CENTER ONLY)
Abs Immature Granulocytes: 0.03 10*3/uL (ref 0.00–0.07)
Basophils Absolute: 0 10*3/uL (ref 0.0–0.1)
Basophils Relative: 0 %
Eosinophils Absolute: 0.3 10*3/uL (ref 0.0–0.5)
Eosinophils Relative: 4 %
HCT: 38.1 % (ref 36.0–46.0)
Hemoglobin: 12.6 g/dL (ref 12.0–15.0)
Immature Granulocytes: 0 %
Lymphocytes Relative: 17 %
Lymphs Abs: 1.2 10*3/uL (ref 0.7–4.0)
MCH: 30.7 pg (ref 26.0–34.0)
MCHC: 33.1 g/dL (ref 30.0–36.0)
MCV: 92.7 fL (ref 80.0–100.0)
Monocytes Absolute: 0.6 10*3/uL (ref 0.1–1.0)
Monocytes Relative: 9 %
Neutro Abs: 4.6 10*3/uL (ref 1.7–7.7)
Neutrophils Relative %: 70 %
Platelet Count: 232 10*3/uL (ref 150–400)
RBC: 4.11 MIL/uL (ref 3.87–5.11)
RDW: 13.5 % (ref 11.5–15.5)
WBC Count: 6.8 10*3/uL (ref 4.0–10.5)
nRBC: 0 % (ref 0.0–0.2)

## 2024-01-25 LAB — CMP (CANCER CENTER ONLY)
ALT: 28 U/L (ref 0–44)
AST: 29 U/L (ref 15–41)
Albumin: 3.4 g/dL — ABNORMAL LOW (ref 3.5–5.0)
Alkaline Phosphatase: 78 U/L (ref 38–126)
Anion gap: 7 (ref 5–15)
BUN: 18 mg/dL (ref 8–23)
CO2: 22 mmol/L (ref 22–32)
Calcium: 8.6 mg/dL — ABNORMAL LOW (ref 8.9–10.3)
Chloride: 104 mmol/L (ref 98–111)
Creatinine: 0.94 mg/dL (ref 0.44–1.00)
GFR, Estimated: >60 mL/min (ref >60)
Glucose, Bld: 100 mg/dL — ABNORMAL HIGH (ref 70–99)
Potassium: 4 mmol/L (ref 3.5–5.1)
Sodium: 133 mmol/L — ABNORMAL LOW (ref 135–145)
Total Bilirubin: 0.8 mg/dL (ref 0.0–1.2)
Total Protein: 6.7 g/dL (ref 6.5–8.1)

## 2024-01-25 MED ORDER — ANASTROZOLE 1 MG PO TABS: 1.0000 mg | ORAL_TABLET | Freq: Every day | ORAL | 1 refills | Status: DC

## 2024-01-25 NOTE — Assessment & Plan Note (Signed)
 04/15/2022 DEXA showed osteopenia. Recommend patient to take calcium  1200 mg daily and vitamin D  supplementation. Repeat DEXA every 2 years - Sept 2025

## 2024-01-25 NOTE — Progress Notes (Signed)
 Survivorship Care Plan visit completed.  Treatment summary reviewed and given to patient.  ASCO answers booklet reviewed and given to patient.  CARE program and Cancer Transitions discussed with patient along with other resources cancer center offers to patients and caregivers.  Patient verbalized understanding.

## 2024-01-25 NOTE — Progress Notes (Signed)
 Hematology/Oncology Progress note Telephone:([phone] Fax:(336) [phone]       CHIEF COMPLAINTS/PURPOSE OF CONSULTATION:  Right breast cancer  ASSESSMENT & PLAN:   Cancer Staging  Breast cancer in [name] [facility]) Staging form: Breast, AJCC 8th Edition - Clinical stage from 05/07/2022: Stage IA (cT1c, cN0, cM0, G2, ER+, PR+, HER2-) - Signed by Timmy Forbes, MD on 05/07/2022 - Pathologic stage from 05/24/2022: Stage IA (pT1b, pN0, cM0, G2, ER+, PR+, HER2-, Oncotype DX score: 34) - Signed by Timmy Forbes, MD on 06/15/2022   Breast cancer in [name] [facility]) Right breast invasive carcinoma, 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  S/p adjuvant radiation of right breast. She tolerates-Arimidex   1mg  daily, recommend to continue.  Refills Rx sent to pharmacy.  Repeat annual bilateral mammogram- previously ordered by surgeon. She prefers to get orders from our office.   Osteopenia 04/15/2022 DEXA showed osteopenia. Recommend patient to take calcium  1200 mg daily and vitamin D  supplementation. Repeat DEXA every 2 years - Sept 2025    Orders Placed This Encounter  Procedures   DG Bone Density    Standing Status:   Future    Expected Date:   04/26/2024    Expiration Date:   01/24/2025    Reason for Exam (SYMPTOM  OR DIAGNOSIS REQUIRED):   Breast cancer    Preferred imaging location?:   [facility]   MM 3D DIAGNOSTIC MAMMOGRAM BILATERAL BREAST    Standing Status:   Future    Expected Date:   04/26/2024    Expiration Date:   01/24/2025    Reason for Exam (SYMPTOM  OR DIAGNOSIS REQUIRED):   Breast cancer    Preferred imaging location?:   [facility]   US  LIMITED ULTRASOUND INCLUDING AXILLA LEFT BREAST     Standing Status:   Future    Expected Date:   04/26/2024    Expiration Date:   01/24/2025    Reason for Exam (SYMPTOM  OR DIAGNOSIS REQUIRED):   breast cancer     Preferred imaging location?:   [facility]   US  LIMITED ULTRASOUND INCLUDING AXILLA RIGHT BREAST    Standing Status:   Future    Expected Date:   04/26/2024    Expiration Date:   01/24/2025    Reason for Exam (SYMPTOM  OR DIAGNOSIS REQUIRED):   breat cancer    Preferred imaging location?:   [facility]   CBC with Differential (Cancer Center Only)    Standing Status:   Future    Expected Date:   07/26/2024    Expiration Date:   10/24/2024   CMP (Cancer Center only)    Standing Status:   Future    Expected Date:   07/26/2024    Expiration Date:   10/24/2024    Follow up 6 months All questions were answered. The patient knows to call the clinic with any problems, questions or concerns.  Timmy Forbes, MD, PhD [facility] Hematology Oncology 01/25/2024    HISTORY OF PRESENTING ILLNESS:  Kristina [name] 73 y.o. [name] presents to establish care for right breast cancer  Menarche at age of [age] No child  OCP use: <5 years History of hysterectomy:  Menopausal status: postmenopausal History of HRT use: 7-8 years History of chest radiation: no Number of previous breast biopsies:  no  Family history + bladder cancer, breast cancer I have reviewed her chart and materials related  to her cancer extensively and collaborated history with the patient. Summary of oncologic history is as follows: Oncology History  Breast cancer in [name] [facility])  04/15/2022 Imaging   Bone density showed osteopenia, FRAX score was not calculated due to patient was on estrogen replacement therapy.   04/16/2022 Mammogram   Bilateral screening mammogram In the right breast, a possible mass warrants further evaluation. In the left breast, no findings suspicious for malignancy    04/28/2022 Imaging   Unilateral right breast diagnostic mammogram There is persistence of a 1.7 cm spiculated mass in the medial aspect of the breast. There are no malignant type microcalcifications. Targeted ultrasound is performed,  showing an irregular hypoechoic mass in the right breast at 3 o'clock 3 cm from the nipple measuring 1.7 x 0.9 x 0.8 cm.  Sonographic evaluation of the right axilla does not show any enlarged adenopathy.   05/07/2022 Initial Diagnosis   Breast cancer in [name] [facility])  04/30/22 s/p right breast biopsy Pathology showed invasive moderately differentiated ductal adenocarcinoma Grade 2.  ER 100% +, PR + 90%, HER2 equivocal IHC 2+, FISH negative.     05/07/2022 Cancer Staging   Staging form: Breast, AJCC 8th Edition - Clinical stage from 05/07/2022: Stage IA (cT1c, cN0, cM0, G2, ER+, PR+, HER2-) - Signed by Timmy Forbes, MD on 05/07/2022 Stage prefix: Initial diagnosis Histologic grading system: 3 grade system   05/24/2022 Surgery   S/p right lumpectomy and right SLNB   Pathology showed  Invasive mammary carcinoma NOS, Grade 2, DICS present, no LVI, all margins are negative for invasive carcinoma and DCIS. pT1b pN0    05/24/2022 Oncotype testing   34, recurrence rate at 9 years with TAM 22%, absolute chemotherapy benefit >15%.    05/24/2022 Cancer Staging   Staging form: Breast, AJCC 8th Edition - Pathologic stage from 05/24/2022: Stage IA (pT1b, pN0, cM0, G2, ER+, PR+, HER2-, Oncotype DX score: 34) - Signed by Timmy Forbes, MD on 06/15/2022 Multigene prognostic tests performed: Oncotype DX Recurrence score range: Greater than or equal to 11 Histologic grading system: 3 grade system   06/29/2022 -  Chemotherapy   Patient declined adjuvant chemotherapy TC     07/15/2022 - 08/27/2022 Radiation Therapy   Patient had a right breast adjuvant radiation   11/24/2022 Imaging   Unilateral diagnostic mammogram  No mammographic evidence of malignancy.     \   INTERVAL HISTORY Kristina [name] is a 73 y.o. [name] who has above history reviewed by me today presents for follow up visit for  Stage IA right breast cancer.  She tolerates Arimidex  1mg  daily with managable side effects, including hot flash,  arthralgia. She is no longer taking any NSAIDs  She has chronic loose BM and has been diagnosed of IBS.   MEDICAL HISTORY:  Past Medical History:  Diagnosis Date   Actinic keratosis    Anxiety    Arthritis    Back pain    Breast cancer (HCC)    Cancer (HCC)    basal and squamous cell skin   Chicken pox    Chronic back pain    Complication of anesthesia    hard to wake up after lap cho-had to stay overnight-bp and hr dropped   COPD (chronic obstructive pulmonary disease) (HCC)    Depression    Dyspnea    due to copd   Gallbladder problem    GERD (gastroesophageal reflux disease)    Goiter    Hypertension    Hypothyroidism  Joint pain    Lactose intolerance    Migraines    Osteoarthritis    PONV (postoperative nausea and vomiting)    very nauseated   SOB (shortness of breath)    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 R upper lip   Thyroid  disease    Thyroid  nodule    Tremor    Vitamin D  deficiency     SURGICAL HISTORY: Past Surgical History:  Procedure Laterality Date   ABDOMINAL HYSTERECTOMY  1978   BIOPSY  03/29/2023   Procedure: BIOPSY;  Surgeon: Selena Daily, MD;  Location: [facility] ENDOSCOPY;  Service: Gastroenterology;;   BREAST LUMPECTOMY Right 05/24/2022   BREAST LUMPECTOMY,RADIO FREQ LOCALIZER,AXILLARY SENTINEL LYMPH NODE BIOPSY Right 05/24/2022   Procedure: BREAST LUMPECTOMY,RADIO FREQ LOCALIZER,AXILLARY SENTINEL LYMPH NODE BIOPSY;  Surgeon: Flynn Hylan, MD;  Location: [facility] ORS;  Service: General;  Laterality: Right;   CATARACT EXTRACTION Left 12/20/2017   will have the right one completed a month later    CATARACT EXTRACTION Right    CHOLECYSTECTOMY  2000   COLONOSCOPY  2007   COLONOSCOPY     2018   COLONOSCOPY WITH PROPOFOL  N/A 03/29/2023   Procedure: COLONOSCOPY WITH PROPOFOL ;  Surgeon: Selena Daily, MD;  Location: [facility] ENDOSCOPY;  Service: Gastroenterology;  Laterality: N/A;   ESOPHAGOGASTRODUODENOSCOPY N/A 03/29/2023    Procedure: ESOPHAGOGASTRODUODENOSCOPY (EGD);  Surgeon: Selena Daily, MD;  Location: [facility] ENDOSCOPY;  Service: Gastroenterology;  Laterality: N/A;   JOINT REPLACEMENT Right 08/19/2017   great toe    POLYPECTOMY  03/29/2023   Procedure: POLYPECTOMY;  Surgeon: Selena Daily, MD;  Location: [facility] ENDOSCOPY;  Service: Gastroenterology;;   TOE SURGERY     UPPER GASTROINTESTINAL ENDOSCOPY  [phone]   UPPER GI ENDOSCOPY     2014    SOCIAL HISTORY: Social History   Socioeconomic History   Marital status: Single    Spouse name: Not on file   Number of children: 0   Years of education: 12   Highest education level: Associate degree: occupational, Scientist, product/process development, or vocational program  Occupational History   Occupation: retired    Comment: administration  Tobacco Use   Smoking status: Former    Current packs/day: 0.00    Average packs/day: 1.5 packs/day for 25.0 years (37.5 ttl pk-yrs)    Types: Cigarettes    Start date: 03/03/1965    Quit date: 03/03/1990    Years since quitting: 33.9   Smokeless tobacco: Never  Vaping Use   Vaping status: Never Used  Substance and Sexual Activity   Alcohol use: Not Currently   Drug use: No   Sexual activity: Never  Other Topics Concern   Not on file  Social History Narrative   Lives alone   Caffeine- tea, 1 cup   Social Drivers of Health   Financial Resource Strain: Medium Risk (10/11/2023)   Overall Financial Resource Strain (CARDIA)    Difficulty of Paying Living Expenses: Somewhat hard  Food Insecurity: No Food Insecurity (10/11/2023)   Hunger Vital Sign    Worried About Running Out of Food in the Last Year: Never true    Ran Out of Food in the Last Year: Never true  Transportation Needs: No Transportation Needs (10/11/2023)   PRAPARE - Administrator, Civil Service (Medical): No    Lack of Transportation (Non-Medical): No  Physical Activity: Insufficiently Active (10/11/2023)   Exercise Vital Sign    Days of Exercise per  Week: 1 day  Minutes of Exercise per Session: 10 min  Stress: Stress Concern Present (10/11/2023)   [employer] of Occupational Health - Occupational Stress Questionnaire    Feeling of Stress : Very much  Social Connections: Socially Isolated (10/11/2023)   Social Connection and Isolation Panel    Frequency of Communication with Friends and Family: More than three times a week    Frequency of Social Gatherings with Friends and Family: Once a week    Attends Religious Services: Never    Database administrator or Organizations: No    Attends Engineer, structural: Not on file    Marital Status: Divorced  Catering manager Violence: Not on file    FAMILY HISTORY: Family History  Problem Relation Age of Onset   Arthritis Mother    Diabetes Mother    COPD Mother    Heart failure Mother    Stroke Father    Hypertension Father    Hyperlipidemia Father    Diabetes Father    CAD Father    Heart disease Father    Heart failure Father    Cancer Sister        bladder cancer 2019  has come back again 2025   Throat cancer Brother    Breast cancer Maternal Aunt    Colon cancer Neg Hx    Stomach cancer Neg Hx    Esophageal cancer Neg Hx     ALLERGIES:  is allergic to sulfa antibiotics, hydrocodone, and meloxicam.  MEDICATIONS:  Current Outpatient Medications  Medication Sig Dispense Refill   Acetaminophen  (TYLENOL  PO) Take 500 mg by mouth 2 (two) times daily.     ALPRAZolam  (XANAX ) 0.25 MG tablet Take 1 tablet by mouth twice daily as needed for anxiety 50 tablet 3   atorvastatin  (LIPITOR) 10 MG tablet Take 1 tablet (10 mg total) by mouth daily. 90 tablet 2   azelaic acid  (AZELEX ) 20 % cream Apply topically 2 (two) times daily. After skin is thoroughly washed and patted dry, gently but thoroughly massage a thin film of azelaic acid  cream into the affected area twice daily, in the morning and evening.     Calcium -Vitamin D -Vitamin K 500-100-40 MG-UNT-MCG CHEW Chew 1 tablet by  mouth daily. chewable     cetirizine (ZYRTEC) 10 MG chewable tablet Chew 10 mg by mouth daily.     Cholecalciferol  (VITAMIN D3) 125 MCG (5000 UT) CAPS Take 1 capsule by mouth daily.     cyanocobalamin  (VITAMIN B12) 500 MCG tablet Take 1,000 mcg by mouth daily.     cyclobenzaprine  (FLEXERIL ) 5 MG tablet Take 1 tablet (5 mg total) by mouth at bedtime. 90 tablet 1   famotidine  (PEPCID ) 40 MG tablet Take 1 tablet (40 mg total) by mouth at bedtime. 90 tablet 1   fluticasone  (FLONASE ) 50 MCG/ACT nasal spray Place 1 spray into both nostrils daily. 100 mL 2   fluticasone -salmeterol (WIXELA INHUB) 250-50 MCG/ACT AEPB Inhale 1 puff into the lungs in the morning and at bedtime. 60 each 3   levothyroxine  (SYNTHROID ) 25 MCG tablet TAKE 1 TABLET BY MOUTH ONCE DAILY BEFORE BREAKFAST 90 tablet 3   Magnesium 250 MG TABS Take 1 tablet by mouth daily.     meclizine  (ANTIVERT ) 12.5 MG tablet Take 1 tablet (12.5 mg total) by mouth 3 (three) times daily as needed for dizziness. 60 tablet 1   Multiple Vitamin (MULTIVITAMIN) tablet Take 1 tablet by mouth daily.     pantoprazole  (PROTONIX ) 40 MG tablet Take 1 tablet (  40 mg total) by mouth daily before breakfast. 90 tablet 1   PARoxetine  (PAXIL ) 10 MG tablet Take 10 mg by mouth daily.     propranolol  (INDERAL ) 40 MG tablet TAKE ONE TABLET BY MOUTH TWICE A DAY 180 tablet 2   RESTASIS 0.05 % ophthalmic emulsion Place 1 drop into both eyes 2 (two) times daily.     VOLTAREN  1 % GEL 2 g as needed.     anastrozole  (ARIMIDEX ) 1 MG tablet Take 1 tablet (1 mg total) by mouth daily. 100 tablet 1   No current facility-administered medications for this visit.    Review of Systems  Constitutional:  Negative for appetite change, chills, fatigue and fever.  HENT:   Negative for hearing loss and voice change.   Eyes:  Negative for eye problems.  Respiratory:  Negative for chest tightness and cough.   Cardiovascular:  Negative for chest pain.  Gastrointestinal:  Positive for  diarrhea. Negative for abdominal distention, abdominal pain and blood in stool.  Endocrine: Negative for hot flashes.  Genitourinary:  Negative for difficulty urinating and frequency.   Musculoskeletal:  Negative for arthralgias.  Skin:  Negative for itching and rash.  Neurological:  Negative for extremity weakness.  Hematological:  Negative for adenopathy.  Psychiatric/Behavioral:  Negative for confusion.      PHYSICAL EXAMINATION: ECOG PERFORMANCE STATUS: 0 - Asymptomatic  Vitals:   01/25/24 1025  BP: (!) 142/78  Pulse: 70  Resp: 18  Temp: (!) 96.6 F (35.9 C)  SpO2: 98%   Filed Weights   01/25/24 1025  Weight: 188 lb 12.8 oz (85.6 kg)    Physical Exam Constitutional:      General: She is not in acute distress. HENT:     Head: Normocephalic.   Eyes:     General: No scleral icterus.   Cardiovascular:     Rate and Rhythm: Normal rate.  Pulmonary:     Effort: Pulmonary effort is normal. No respiratory distress.  Abdominal:     General: There is no distension.   Musculoskeletal:        General: Normal range of motion.     Cervical back: Normal range of motion and neck supple.   Skin:    Findings: No bruising.   Neurological:     Mental Status: She is alert and oriented to person, place, and time. Mental status is at baseline.     Cranial Nerves: No cranial nerve deficit.     Motor: No abnormal muscle tone.   Psychiatric:        Mood and Affect: Mood and affect normal.     Breast exam was performed in seated and lying down position. Patient is status post right lumpectomy, with scarring/tissue thickening.  No palpable breast masses bilaterally.  No palpable axillary adenopathy bilaterally.  LABORATORY DATA:  I have reviewed the data as listed    Latest Ref Rng & Units 01/25/2024   10:10 AM 06/27/2023    9:58 AM 02/07/2023    1:44 PM  CBC  WBC 4.0 - 10.5 K/uL 6.8  7.0  7.7   Hemoglobin 12.0 - 15.0 g/dL 40.9  81.1  91.4   Hematocrit 36.0 - 46.0 % 38.1   38.7  38.9   Platelets 150 - 400 K/uL 232  262  235       Latest Ref Rng & Units 01/25/2024   10:10 AM 06/27/2023    9:57 AM 02/07/2023    1:44 PM  CMP  Glucose  70 - 99 mg/dL 098  119  147   BUN 8 - 23 mg/dL 18  17  15    Creatinine 0.44 - 1.00 mg/dL 8.29  5.62  1.30   Sodium 135 - 145 mmol/L 133  134  138   Potassium 3.5 - 5.1 mmol/L 4.0  4.5  3.9   Chloride 98 - 111 mmol/L 104  103  104   CO2 22 - 32 mmol/L 22  22  19    Calcium  8.9 - 10.3 mg/dL 8.6  8.9  9.2   Total Protein 6.5 - 8.1 g/dL 6.7  6.8    Total Bilirubin 0.0 - 1.2 mg/dL 0.8  0.6    Alkaline Phos 38 - 126 U/L 78  72    AST 15 - 41 U/L 29  28    ALT 0 - 44 U/L 28  29       RADIOGRAPHIC STUDIES: I have personally reviewed the radiological images as listed and agreed with the findings in the report. No results found.

## 2024-01-25 NOTE — Assessment & Plan Note (Addendum)
 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  S/p adjuvant radiation of right breast. She tolerates-Arimidex   1mg  daily, recommend to continue.  Refills Rx sent to pharmacy.  Repeat annual bilateral mammogram- previously ordered by surgeon. She prefers to get orders from our office.

## 2024-01-30 ENCOUNTER — Ambulatory Visit

## 2024-01-30 ENCOUNTER — Encounter: Payer: Self-pay | Admitting: Podiatry

## 2024-01-30 DIAGNOSIS — L600 Ingrowing nail: Secondary | ICD-10-CM

## 2024-01-30 NOTE — Patient Instructions (Signed)

## 2024-01-30 NOTE — Progress Notes (Unsigned)
 Subjective:   Patient ID: Kristina [name], female   DOB: 72 y.o.   MRN: [id number]   HPI Chief Complaint  Patient presents with   Ingrown Toenail    RM#13 Right foot big toe nail ingrown causing pain and discomfort worsening in the past 2 months.   73 year old female presents the office today with concerns of a chronic ingrown toe of the right big toe which been ongoing for many years but recently has become more tender.  She states that she had an injury many years ago and as it grew out it never fully The Sides.  She Tries to Get Pedicures to Keep It Trimmed However Recently Has Been More Painful and Swollen.  No Purulence.   Review of Systems  All other systems reviewed and are negative.  Past Medical History:  Diagnosis Date   Actinic keratosis    Anxiety    Arthritis    Back pain    Breast cancer (HCC)    Cancer (HCC)    basal and squamous cell skin   Chicken pox    Chronic back pain    Complication of anesthesia    hard to wake up after lap cho-had to stay overnight-bp and hr dropped   COPD (chronic obstructive pulmonary disease) (HCC)    Depression    Dyspnea    due to copd   Gallbladder problem    GERD (gastroesophageal reflux disease)    Goiter    Hypertension    Hypothyroidism    Joint pain    Lactose intolerance    Migraines    Osteoarthritis    PONV (postoperative nausea and vomiting)    very nauseated   SOB (shortness of breath)    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 R upper lip   Thyroid  disease    Thyroid  nodule    Tremor    Vitamin D  deficiency     Past Surgical History:  Procedure Laterality Date   ABDOMINAL HYSTERECTOMY  1978   BIOPSY  03/29/2023   Procedure: BIOPSY;  Surgeon: Unk Corinn Skiff, MD;  Location: [facility] ENDOSCOPY;  Service: Gastroenterology;;   BREAST LUMPECTOMY Right 05/24/2022   BREAST LUMPECTOMY,RADIO FREQ LOCALIZER,AXILLARY SENTINEL LYMPH NODE BIOPSY Right 05/24/2022   Procedure: BREAST LUMPECTOMY,RADIO FREQ  LOCALIZER,AXILLARY SENTINEL LYMPH NODE BIOPSY;  Surgeon: Lane Shope, MD;  Location: [facility] ORS;  Service: General;  Laterality: Right;   CATARACT EXTRACTION Left 12/20/2017   will have the right one completed a month later    CATARACT EXTRACTION Right    CHOLECYSTECTOMY  2000   COLONOSCOPY  2007   COLONOSCOPY     2018   COLONOSCOPY WITH PROPOFOL  N/A 03/29/2023   Procedure: COLONOSCOPY WITH PROPOFOL ;  Surgeon: Unk Corinn Skiff, MD;  Location: [facility] ENDOSCOPY;  Service: Gastroenterology;  Laterality: N/A;   ESOPHAGOGASTRODUODENOSCOPY N/A 03/29/2023   Procedure: ESOPHAGOGASTRODUODENOSCOPY (EGD);  Surgeon: Unk Corinn Skiff, MD;  Location: [facility] ENDOSCOPY;  Service: Gastroenterology;  Laterality: N/A;   JOINT REPLACEMENT Right 08/19/2017   great toe    POLYPECTOMY  03/29/2023   Procedure: POLYPECTOMY;  Surgeon: Unk Corinn Skiff, MD;  Location: [facility] ENDOSCOPY;  Service: Gastroenterology;;   TOE SURGERY     UPPER GASTROINTESTINAL ENDOSCOPY  [phone]   UPPER GI ENDOSCOPY     2014     Current Outpatient Medications:    Acetaminophen  (TYLENOL  PO), Take 500 mg by mouth 2 (two) times daily., Disp: , Rfl:    ALPRAZolam  (XANAX ) 0.25 MG tablet,  Take 1 tablet by mouth twice daily as needed for anxiety, Disp: 50 tablet, Rfl: 3   anastrozole  (ARIMIDEX ) 1 MG tablet, Take 1 tablet (1 mg total) by mouth daily., Disp: 100 tablet, Rfl: 1   atorvastatin  (LIPITOR) 10 MG tablet, Take 1 tablet (10 mg total) by mouth daily., Disp: 90 tablet, Rfl: 2   azelaic acid  (AZELEX ) 20 % cream, Apply topically 2 (two) times daily. After skin is thoroughly washed and patted dry, gently but thoroughly massage a thin film of azelaic acid  cream into the affected area twice daily, in the morning and evening., Disp: , Rfl:    Calcium -Vitamin D -Vitamin K 500-100-40 MG-UNT-MCG CHEW, Chew 1 tablet by mouth daily. chewable, Disp: , Rfl:    cetirizine (ZYRTEC) 10 MG chewable tablet, Chew 10 mg by mouth daily., Disp: , Rfl:     Cholecalciferol  (VITAMIN D3) 125 MCG (5000 UT) CAPS, Take 1 capsule by mouth daily., Disp: , Rfl:    cyanocobalamin  (VITAMIN B12) 500 MCG tablet, Take 1,000 mcg by mouth daily., Disp: , Rfl:    cyclobenzaprine  (FLEXERIL ) 5 MG tablet, Take 1 tablet (5 mg total) by mouth at bedtime., Disp: 90 tablet, Rfl: 1   famotidine  (PEPCID ) 40 MG tablet, Take 1 tablet (40 mg total) by mouth at bedtime., Disp: 90 tablet, Rfl: 1   fluticasone  (FLONASE ) 50 MCG/ACT nasal spray, Place 1 spray into both nostrils daily., Disp: 100 mL, Rfl: 2   fluticasone -salmeterol (WIXELA INHUB) 250-50 MCG/ACT AEPB, Inhale 1 puff into the lungs in the morning and at bedtime., Disp: 60 each, Rfl: 3   levothyroxine  (SYNTHROID ) 25 MCG tablet, TAKE 1 TABLET BY MOUTH ONCE DAILY BEFORE BREAKFAST, Disp: 90 tablet, Rfl: 3   Magnesium 250 MG TABS, Take 1 tablet by mouth daily., Disp: , Rfl:    meclizine  (ANTIVERT ) 12.5 MG tablet, Take 1 tablet (12.5 mg total) by mouth 3 (three) times daily as needed for dizziness., Disp: 60 tablet, Rfl: 1   Multiple Vitamin (MULTIVITAMIN) tablet, Take 1 tablet by mouth daily., Disp: , Rfl:    pantoprazole  (PROTONIX ) 40 MG tablet, Take 1 tablet (40 mg total) by mouth daily before breakfast., Disp: 90 tablet, Rfl: 1   PARoxetine  (PAXIL ) 10 MG tablet, Take 10 mg by mouth daily., Disp: , Rfl:    propranolol  (INDERAL ) 40 MG tablet, TAKE ONE TABLET BY MOUTH TWICE A DAY, Disp: 180 tablet, Rfl: 2   RESTASIS 0.05 % ophthalmic emulsion, Place 1 drop into both eyes 2 (two) times daily., Disp: , Rfl:    VOLTAREN  1 % GEL, 2 g as needed., Disp: , Rfl:   Allergies  Allergen Reactions   Sulfa Antibiotics Rash    Rash all over body/fever   Hydrocodone Nausea Only    Per patient    Meloxicam Other (See Comments)    skin infection on leg          Objective:  Physical Exam  General: AAO x3, NAD  Dermatological: Incurvation present to right hallux toenail on both medial lateral aspects.  Localized edema and  erythema mostly on the lateral aspect.  There is no drainage or pus.  There is no ascending cellulitis.  No open lesions.  Vascular: Dorsalis Pedis artery and Posterior Tibial artery pedal pulses are 2/4 bilateral with immedate capillary fill time.  There is no pain with calf compression, swelling, warmth, erythema.   Neruologic: Grossly intact via light touch bilateral.   Musculoskeletal: Tenderness on the ingrown toenail.  No other areas of discomfort.  Gait: Unassisted, Nonantalgic.       Assessment:   Right hallux ingrown toenail     Plan:  -Treatment options discussed including all alternatives, risks, and complications -Etiology of symptoms were discussed -At this time, the patient is requesting partial nail removal with chemical matricectomy to the symptomatic portion of the nail. Risks and complications were discussed with the patient for which they understand and written consent was obtained. Under sterile conditions a total of 3 mL of a mixture of 2% lidocaine  plain and 0.5% Marcaine  plain was infiltrated in a hallux block fashion. Once anesthetized, the skin was prepped in sterile fashion. A tourniquet was then applied. Next the medial, lateral aspect of hallux nail border was then sharply excised making sure to remove the entire offending nail border. Once the nails were ensured to be removed area was debrided and the underlying skin was intact. There is no purulence identified in the procedure. Next phenol was then applied under standard conditions and copiously irrigated.  Antibiotic ointment was applied. A dry sterile dressing was applied. After application of the dressing the tourniquet was removed and there is found to be an immediate capillary refill time to the digit. The patient tolerated the procedure well any complications. Post procedure instructions were discussed the patient for which he verbally understood. Discussed signs/symptoms of infection and directed to call the  office immediately should any occur or go directly to the emergency room. In the meantime, encouraged to call the office with any questions, concerns, changes symptoms. - She just recently finished antibiotics and will hold further oral antibiotics at this time however there is any signs or symptoms infection we will restart.    Kristina [name] DPM

## 2024-02-06 ENCOUNTER — Telehealth: Payer: Self-pay | Admitting: Podiatry

## 2024-02-06 ENCOUNTER — Other Ambulatory Visit: Payer: Self-pay | Admitting: Podiatry

## 2024-02-06 MED ORDER — CEPHALEXIN 500 MG PO CAPS: 500.0000 mg | ORAL_CAPSULE | Freq: Three times a day (TID) | ORAL | 0 refills | Status: DC

## 2024-02-06 MED ORDER — MUPIROCIN 2 % EX OINT: 1.0000 | TOPICAL_OINTMENT | Freq: Two times a day (BID) | CUTANEOUS | 2 refills | Status: DC

## 2024-02-06 NOTE — Telephone Encounter (Signed)
 Patient called stating she had an inrown toe nail removed last week. Pt states its red very sore she not sure if its getting infected. Patient is looking for some medical advice.

## 2024-02-14 ENCOUNTER — Other Ambulatory Visit: Payer: Self-pay

## 2024-02-14 ENCOUNTER — Ambulatory Visit: Admitting: Pulmonary Disease

## 2024-02-14 DIAGNOSIS — R0602 Shortness of breath: Secondary | ICD-10-CM

## 2024-02-14 DIAGNOSIS — R062 Wheezing: Secondary | ICD-10-CM

## 2024-02-14 LAB — PULMONARY FUNCTION TEST
DL/VA % pred: 90 %
DL/VA: 3.82 ml/min/mmHg/L
DLCO unc % pred: 104 %
DLCO unc: 18.53 ml/min/mmHg
FEF 25-75 Pre: 2.02 L/s
FEF2575-%Pred-Pre: 122 %
FEV1-%Pred-Pre: 104 %
FEV1-Pre: 2.07 L
FEV1FVC-%Pred-Pre: 105 %
FEV6-%Pred-Pre: 103 %
FEV6-Pre: 2.6 L
FEV6FVC-%Pred-Pre: 104 %
FVC-%Pred-Pre: 98 %
FVC-Pre: 2.62 L
Pre FEV1/FVC ratio: 79 %
Pre FEV6/FVC Ratio: 100 %
RV % pred: 112 %
RV: 2.41 L
TLC % pred: 112 %
TLC: 5.34 L

## 2024-02-14 NOTE — Progress Notes (Signed)
 Full PFT completed today without post due to pt not meeting ATS on [facility] with coaching on multiple attempts.

## 2024-02-14 NOTE — Patient Instructions (Signed)
 Full PFT completed today without post.

## 2024-02-16 ENCOUNTER — Ambulatory Visit: Admitting: Pulmonary Disease

## 2024-02-16 ENCOUNTER — Encounter: Payer: Self-pay | Admitting: Pulmonary Disease

## 2024-02-16 VITALS — BP 128/80 | HR 67 | Temp 97.1°F | Ht 62.0 in | Wt 187.6 lb

## 2024-02-16 DIAGNOSIS — R0602 Shortness of breath: Secondary | ICD-10-CM

## 2024-02-16 DIAGNOSIS — F17210 Nicotine dependence, cigarettes, uncomplicated: Secondary | ICD-10-CM | POA: Diagnosis not present

## 2024-02-16 LAB — NITRIC OXIDE: Nitric Oxide: 28

## 2024-02-16 NOTE — Progress Notes (Signed)
 Synopsis: Referred in by [place], Betty G, MD   Subjective:   PATIENT ID: Kristina [name] GENDER: female DOB: 03-Jun-1951, MRN: [id number]  Chief Complaint  Patient presents with   Follow-up    DOE. No wheezing or cough.     HPI Kristina [name] is a pleasant 73 years old female patient with a past medical history of hyperlipidemia, hypothyroidism, anxiety, breast cancer in 2023 s/p lumpectomy, radiation and currently on Arimidex   presenting today to the pulmonary clinic for ongoing shortness of breath and wheezing.   She reports that since October she started having persistent cough described as coughing spells.  Unsure what triggers it.  She reports that she does not have GERD and after her endoscopy she was placed on high-dose PPI which did not help.  In October they tapered her to 1 time daily and she noticed her symptoms coming back.  She does report wheezing mostly at night when she lays flat.  Furthermore she does report hypersensitivity to strong scents.  She denies any shortness of breath.  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  CT chest 10/2023 without any signs of ILD.   Family history -father with lung cancer who was a heavy smoker.  Social history -she smoked 1 pack/day for for about 25 years she quit in 1992.  She lives at home alone and has 1 cat.  She has no kids.  She worked as an Research scientist (medical) but currently retired.  OV 02/16/2024 - Kristina [name] is here to follow up on her PFTs. She had a significant response with Wixela and her cough has completely resolved. PFTs are normal. We discussed that she should continue with Wixela for at least the next 6 monhts at which point we will decide on tapering.   ROS All symptoms were reviewed and are negative except for the above.  Objective:   Vitals:   02/16/24 1131  BP: 128/80  Pulse: 67  Temp: (!) 97.1 F (36.2 C)  SpO2: 96%  Weight: 187 lb 9.6 oz (85.1 kg)  Height:  5' 2 (1.575 m)   96% on RA BMI Readings from Last 3 Encounters:  02/16/24 34.31 kg/m  02/14/24 34.42 kg/m  01/25/24 34.53 kg/m   Wt Readings from Last 3 Encounters:  02/16/24 187 lb 9.6 oz (85.1 kg)  02/14/24 188 lb 3.2 oz (85.4 kg)  01/25/24 188 lb 12.8 oz (85.6 kg)    Physical Exam GEN: NAD, Healthy Appearing HEENT: Supple Neck, Reactive Pupils, EOMI, Erythematous nasal mucosae.  CVS: Normal S1, Normal S2, RRR, No murmurs or ES appreciated  Lungs: Clear bilateral air entry.  Abdomen: Soft, non tender, non distended, + BS  Extremities: Warm and well perfused, No edema  Skin: No suspicious lesions appreciated  Psych: Normal Affect  Ancillary Information   CBC    Component Value Date/Time   WBC 6.8 01/25/2024 1010   WBC 6.5 09/06/2022 0957   RBC 4.11 01/25/2024 1010   HGB 12.6 01/25/2024 1010   HGB 12.5 02/07/2023 1344   HCT 38.1 01/25/2024 1010   HCT 38.9 02/07/2023 1344   PLT 232 01/25/2024 1010   PLT 235 02/07/2023 1344   MCV 92.7 01/25/2024 1010   MCV 94 02/07/2023 1344   MCH 30.7 01/25/2024 1010   MCHC 33.1 01/25/2024 1010   RDW 13.5 01/25/2024 1010   RDW 12.8 02/07/2023 1344   LYMPHSABS 1.2 01/25/2024 1010   MONOABS 0.6 01/25/2024 1010  EOSABS 0.3 01/25/2024 1010   BASOSABS 0.0 01/25/2024 1010   Labs and imaging were reviewed.     Latest Ref Rng & Units 02/14/2024   10:37 AM  PFT Results  FVC-Pre L 2.62  P  FVC-Predicted Pre % 98  P  Pre FEV1/FVC % % 79  P  FEV1-Pre L 2.07  P  FEV1-Predicted Pre % 104  P  DLCO uncorrected ml/min/mmHg 18.53  P  DLCO UNC% % 104  P  DLVA Predicted % 90  P  TLC L 5.34  P  TLC % Predicted % 112  P  RV % Predicted % 112  P    P Preliminary result     Assessment & Plan:  Kristina [name] is a pleasant 73 years old female patient with a past medical history of hyperlipidemia, hypothyroidism, anxiety, breast cancer in 2023 s/p lumpectomy, radiation and currently on Arimidex   presenting today to the pulmonary clinic for  ongoing shortness of breath and wheezing.   #Cough variant asthma  FENO 48 --> FENO 28 02/16/2024 indicating good compliance to inhaler treatment.  EOS 400  Normal PFTs.    []  c/w fluticasone -Salmeterol [Wixela] 250-50 1 puff BID. Advised mouth rinsing after each use.  []  c/w Albuterol  on an as needed basis.  []  c/w  Flonase  1spray each nostril twice a day.   #GERD  Currently on Protonix  40mg  PO daily and Famotidine  40mg  PO daily. Advised on lifestyle modifications.   RTC 6 months.   I spent 20 minutes caring for this patient today, including preparing to see the patient, obtaining a medical history , reviewing a separately obtained history, performing a medically appropriate examination and/or evaluation, counseling and educating the patient/family/caregiver, ordering medications, tests, or procedures, documenting clinical information in the electronic health record, and independently interpreting results (not separately reported/billed) and communicating results to the patient/family/caregiver  Darrin Barn, MD [facility] 02/16/2024 11:39 AM

## 2024-02-20 ENCOUNTER — Ambulatory Visit: Admitting: Podiatry

## 2024-02-20 ENCOUNTER — Encounter: Payer: Self-pay | Admitting: Podiatry

## 2024-02-20 DIAGNOSIS — L03031 Cellulitis of right toe: Secondary | ICD-10-CM

## 2024-02-20 DIAGNOSIS — L600 Ingrowing nail: Secondary | ICD-10-CM

## 2024-02-20 NOTE — Progress Notes (Signed)
 She presents today for follow-up of her matrixectomy performed by Dr. Alona last month.  She states that once she received the soaking information in the medication she has done much better.  Objective: Vital signs are stable oriented x 3 there is still mild erythema along the tibial-fibular border of the hallux right.  There is some scab present but no purulence and no signs of infection.  Assessment: Well-healing surgical toe.  Plan: I recommended that she continue to soak it to help get the inflammation out of it Epsom salts and warm water  20 minutes every other day.  As long as it is not draining there is no reason to cover it.  Follow-up with me should this become more painful.

## 2024-02-23 NOTE — Addendum Note (Signed)
 Addended by: ELAYNE ROSINA BRAVO on: 02/23/2024 06:54 PM   Modules accepted: Level of Service

## 2024-03-14 ENCOUNTER — Other Ambulatory Visit: Payer: Self-pay

## 2024-03-14 DIAGNOSIS — R232 Flushing: Secondary | ICD-10-CM

## 2024-03-14 DIAGNOSIS — F418 Other specified anxiety disorders: Secondary | ICD-10-CM

## 2024-03-14 MED ORDER — PAROXETINE HCL 10 MG PO TABS: 10.0000 mg | ORAL_TABLET | Freq: Every day | ORAL | 2 refills | Status: AC

## 2024-03-21 ENCOUNTER — Other Ambulatory Visit: Payer: Self-pay | Admitting: Family Medicine

## 2024-03-21 DIAGNOSIS — I10 Essential (primary) hypertension: Secondary | ICD-10-CM

## 2024-03-21 NOTE — Telephone Encounter (Signed)
 Copied from CRM [id number]. Topic: Clinical - Medication Refill >> Mar 21, 2024  1:51 PM Paige D wrote: Medication:  propranolol  (INDERAL ) 40 MG tablet   Has the patient contacted their pharmacy? Yes (Agent: If no, request that the patient contact the pharmacy for the refill. If patient does not wish to contact the pharmacy document the reason why and proceed with request.) (Agent: If yes, when and what did the pharmacy advise?)  This is the patient's preferred pharmacy:   [facility]) [place]  - [place], [place] - [id number] [facility] [address] [place] [address] Phone: [phone] Fax: [phone]  Is this the correct pharmacy for this prescription? Yes If no, delete pharmacy and type the correct one.   Has the prescription been filled recently? No  Is the patient out of the medication? Yes  Has the patient been seen for an appointment in the last year OR does the patient have an upcoming appointment? Yes  Can we respond through MyChart? Yes  Agent: Please be advised that Rx refills may take up to 3 business days. We ask that you follow-up with your pharmacy.

## 2024-03-22 ENCOUNTER — Encounter: Payer: Self-pay | Admitting: Family Medicine

## 2024-03-22 NOTE — Telephone Encounter (Signed)
 Wrong office

## 2024-03-23 MED ORDER — PROPRANOLOL HCL 40 MG PO TABS: 40.0000 mg | ORAL_TABLET | Freq: Two times a day (BID) | ORAL | 2 refills | Status: AC

## 2024-04-08 ENCOUNTER — Other Ambulatory Visit: Payer: Self-pay | Admitting: Family Medicine

## 2024-04-08 DIAGNOSIS — K219 Gastro-esophageal reflux disease without esophagitis: Secondary | ICD-10-CM

## 2024-04-24 ENCOUNTER — Ambulatory Visit
Admission: RE | Admit: 2024-04-24 | Discharge: 2024-04-24 | Disposition: A | Discharge status: Home / Self Care | Source: Ambulatory Visit | Attending: Oncology | Admitting: Oncology

## 2024-04-24 DIAGNOSIS — Z13820 Encounter for screening for osteoporosis: Secondary | ICD-10-CM | POA: Diagnosis not present

## 2024-04-24 DIAGNOSIS — M8589 Other specified disorders of bone density and structure, multiple sites: Secondary | ICD-10-CM | POA: Diagnosis not present

## 2024-04-24 DIAGNOSIS — Z170 Estrogen receptor positive status [ER+]: Secondary | ICD-10-CM | POA: Insufficient documentation

## 2024-04-24 DIAGNOSIS — C50211 Malignant neoplasm of upper-inner quadrant of right female breast: Secondary | ICD-10-CM | POA: Diagnosis not present

## 2024-04-24 DIAGNOSIS — Z780 Asymptomatic menopausal state: Secondary | ICD-10-CM | POA: Diagnosis not present

## 2024-04-24 DIAGNOSIS — R92323 Mammographic fibroglandular density, bilateral breasts: Secondary | ICD-10-CM | POA: Diagnosis not present

## 2024-04-30 ENCOUNTER — Other Ambulatory Visit: Payer: Self-pay

## 2024-04-30 DIAGNOSIS — R062 Wheezing: Secondary | ICD-10-CM

## 2024-04-30 MED ORDER — FLUTICASONE-SALMETEROL 250-50 MCG/ACT IN AEPB: 1.0000 | INHALATION_SPRAY | Freq: Two times a day (BID) | RESPIRATORY_TRACT | 3 refills | Status: AC

## 2024-04-30 NOTE — Progress Notes (Signed)
 Received a fax from [facility] pharmacy asking for a refill on the patient's Wixela.  I have sent on the refill.  Nothing further needed.

## 2024-05-07 ENCOUNTER — Other Ambulatory Visit: Payer: Self-pay | Admitting: Family Medicine

## 2024-05-07 DIAGNOSIS — K219 Gastro-esophageal reflux disease without esophagitis: Secondary | ICD-10-CM

## 2024-05-07 MED ORDER — PANTOPRAZOLE SODIUM 40 MG PO TBEC: 40.0000 mg | DELAYED_RELEASE_TABLET | Freq: Every day | ORAL | 1 refills | Status: AC

## 2024-05-07 NOTE — Telephone Encounter (Signed)
 Copied from CRM #[id number]. Topic: Clinical - Medication Refill >> May 07, 2024  8:53 AM Emylou G wrote: Medication: pantoprazole  (PROTONIX ) 40 MG tablet  Has the patient contacted their pharmacy? Yes (Agent: If no, request that the patient contact the pharmacy for the refill. If patient does not wish to contact the pharmacy document the reason why and proceed with request.) (Agent: If yes, when and what did the pharmacy advise?)  This is the patient's preferred pharmacy:   [facility]) [place]  - [place], [place] - [id number] [facility] [address] [place] [address] Phone: [phone] Fax: [phone]    Is this the correct pharmacy for this prescription? Yes If no, delete pharmacy and type the correct one.   Has the prescription been filled recently? No  Is the patient out of the medication? Unsure  Has the patient been seen for an appointment in the last year OR does the patient have an upcoming appointment? Yes  Can we respond through MyChart? No  Agent: Please be advised that Rx refills may take up to 3 business days. We ask that you follow-up with your pharmacy.

## 2024-05-08 ENCOUNTER — Ambulatory Visit: Admitting: Dermatology

## 2024-05-08 DIAGNOSIS — L578 Other skin changes due to chronic exposure to nonionizing radiation: Secondary | ICD-10-CM

## 2024-05-08 DIAGNOSIS — D692 Other nonthrombocytopenic purpura: Secondary | ICD-10-CM

## 2024-05-08 DIAGNOSIS — L820 Inflamed seborrheic keratosis: Secondary | ICD-10-CM | POA: Diagnosis not present

## 2024-05-08 DIAGNOSIS — S0081XA Abrasion of other part of head, initial encounter: Secondary | ICD-10-CM | POA: Diagnosis not present

## 2024-05-08 DIAGNOSIS — L918 Other hypertrophic disorders of the skin: Secondary | ICD-10-CM

## 2024-05-08 DIAGNOSIS — T148XXA Other injury of unspecified body region, initial encounter: Secondary | ICD-10-CM

## 2024-05-08 DIAGNOSIS — W908XXA Exposure to other nonionizing radiation, initial encounter: Secondary | ICD-10-CM | POA: Diagnosis not present

## 2024-05-08 DIAGNOSIS — L719 Rosacea, unspecified: Secondary | ICD-10-CM

## 2024-05-08 NOTE — Patient Instructions (Addendum)

## 2024-05-08 NOTE — Progress Notes (Signed)
 Follow-Up Visit   Subjective  Kristina [name] is a 73 y.o. female who presents for the following: 6 month AK follow up. Patient has areas of concern on her abdomen and back (itchy).    The following portions of the chart were reviewed this encounter and updated as appropriate: medications, allergies, medical history  Review of Systems:  No other skin or systemic complaints except as noted in HPI or Assessment and Plan.  Objective  Well appearing patient in no apparent distress; mood and affect are within normal limits.   A focused examination was performed of the following areas: Face, Neck  Relevant exam findings are noted in the Assessment and Plan.  Spinal lower back x1 Stuck on waxy paps with erythema  Assessment & Plan   ACTINIC DAMAGE - chronic, secondary to cumulative UV radiation exposure/sun exposure over time - diffuse scaly erythematous macules with underlying dyspigmentation - Recommend daily broad spectrum sunscreen SPF 30+ to sun-exposed areas, reapply every 2 hours as needed.  - Recommend staying in the shade or wearing long sleeves, sun glasses (UVA+UVB protection) and wide brim hats (4-inch brim around the entire circumference of the hat). - Call for new or changing lesions. - Patient completed two week bid course of 5-fluorouracil/calcipotriene on her forehead, nose, cheeks with good results. Strong reaction occurred (photos viewed) and patient stopped.  ROSACEA Exam Mid face erythema with telangiectasias on the cheeks, pink macule on the right zygoma  Chronic condition with duration or expected duration over one year. Currently well-controlled.  Rosacea is a chronic progressive skin condition usually affecting the face of adults, causing redness and/or acne bumps. It is treatable but not curable. It sometimes affects the eyes (ocular rosacea) as well. It may respond to topical and/or systemic medication and can flare with stress, sun exposure, alcohol,  exercise, topical steroids (including hydrocortisone/cortisone 10) and some foods.  Daily application of broad spectrum spf 30+ sunscreen to face is recommended to reduce flares.  Patient denies grittiness of the eyes  Treatment Plan - Continue Azelaic acid  15% gel; apply topically twice daily as needed for flares, pt will call for rfs  EXCORIATION Exam: Excoriation at the right forehead and left under eye   Treatment Plan: Recommend vaseline. Call if not resolving.  Acrochordons (Skin Tags) - Fleshy, skin-colored pedunculated papules at the right abdomen - Benign appearing.  - Observe. - If desired, they can be removed with an in office procedure that is not covered by insurance. - Please call the clinic if you notice any new or changing lesions.   Purpura - Chronic; persistent and recurrent.  Treatable, but not curable. - Violaceous macules and patches bilateral forearms - Benign - Related to trauma, age, sun damage and/or use of blood thinners, chronic use of topical and/or oral steroids - Observe - Can use OTC arnica containing moisturizer such as Dermend Bruise Formula if desired - Call for worsening or other concerns  INFLAMED SEBORRHEIC KERATOSIS Spinal lower back x1 Symptomatic, irritating, patient would like treated. Destruction of lesion - Spinal lower back x1  Destruction method: cryotherapy   Informed consent: discussed and consent obtained   Lesion destroyed using liquid nitrogen: Yes   Region frozen until ice ball extended beyond lesion: Yes   Outcome: patient tolerated procedure well with no complications   Post-procedure details: wound care instructions given   Additional details:  Prior to procedure, discussed risks of blister formation, small wound, skin dyspigmentation, or rare scar following cryotherapy. Recommend Vaseline ointment  to treated areas while healing.    Return in about 1 year (around 05/08/2025) for TBSE.  I, Emerick Ege, CMA am acting as  scribe for Rexene Rattler, MD.   Documentation: I have reviewed the above documentation for accuracy and completeness, and I agree with the above.  Rexene Rattler, MD

## 2024-05-09 ENCOUNTER — Other Ambulatory Visit: Payer: Self-pay

## 2024-05-09 DIAGNOSIS — H16223 Keratoconjunctivitis sicca, not specified as Sjogren's, bilateral: Secondary | ICD-10-CM | POA: Diagnosis not present

## 2024-05-09 DIAGNOSIS — Z961 Presence of intraocular lens: Secondary | ICD-10-CM | POA: Diagnosis not present

## 2024-05-22 ENCOUNTER — Ambulatory Visit (INDEPENDENT_AMBULATORY_CARE_PROVIDER_SITE_OTHER): Admitting: Family Medicine

## 2024-05-22 ENCOUNTER — Other Ambulatory Visit: Payer: Self-pay | Admitting: Family Medicine

## 2024-05-22 VITALS — BP 145/80 | HR 72 | Temp 97.9°F | Resp 16 | Ht 62.0 in | Wt 192.2 lb

## 2024-05-22 DIAGNOSIS — E049 Nontoxic goiter, unspecified: Secondary | ICD-10-CM

## 2024-05-22 DIAGNOSIS — E038 Other specified hypothyroidism: Secondary | ICD-10-CM

## 2024-05-22 DIAGNOSIS — R42 Dizziness and giddiness: Secondary | ICD-10-CM

## 2024-05-22 DIAGNOSIS — I10 Essential (primary) hypertension: Secondary | ICD-10-CM | POA: Diagnosis not present

## 2024-05-22 DIAGNOSIS — F418 Other specified anxiety disorders: Secondary | ICD-10-CM

## 2024-05-22 DIAGNOSIS — E7849 Other hyperlipidemia: Secondary | ICD-10-CM

## 2024-05-22 NOTE — Progress Notes (Unsigned)
 ACUTE VISIT Chief Complaint  Patient presents with   Hypertension   HPI: [name] is a 73 y.o. female, who is here today complaining of *** HPI Discussed the use of AI scribe software for clinical note transcription with the patient, who gave verbal consent to proceed.  History of Present Illness Kristina [name] is a 73 year old female with hypertension and migraines who presents with episodes of head pressure and elevated blood pressure.  She has been experiencing a sensation of head pressure, particularly when bending forward, which began last week. The sensation is described as her ears 'kind of close up' and her head feeling 'full of pressure'. This occurred again yesterday when she was lying down and upon getting up, she felt unwell, though not dizzy. She also experienced a headache located in the frontoparietal region, which prompted her to check her blood pressure.  She has noted elevated blood pressure readings recently, with a measurement of 155/84 mmHg yesterday during the headache episode, and 145/83 mmHg this morning. Her usual blood pressure readings are around 126/70 mmHg. She has a history of stable blood pressure readings, typically ranging from 120/70 to 132/78 mmHg. She has not been diligent in monitoring her blood pressure regularly due to its usual stability.  She has a history of migraines, which she reports having 'grown out of' over the years. 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 An MRI of her brain conducted in September 2023 was normal.  She has a history of vertigo, describing episodes of feeling off balance, particularly when turning while walking. No recent changes in hearing or earaches, though she does have a history of allergies which can cause ear fullness.  Her current medications include propranolol ,  alprazolam , a nasal spray similar to Flonase , and an inhaler. She also uses a nasal spray similar to Flonase  for allergic symptoms and an inhaler for asthma-like symptoms, which have improved her cough and wheezing.  She reports low energy levels and fatigue, which have been persistent.   Lab Results  Component Value Date   TSH 1.400 02/07/2023   Lab Results  Component Value Date   NA 133 (L) 01/25/2024   CL 104 01/25/2024   K 4.0 01/25/2024   CO2 22 01/25/2024   BUN 18 01/25/2024   CREATININE 0.94 01/25/2024   GFRNONAA >60 01/25/2024   CALCIUM  8.6 (L) 01/25/2024   ALBUMIN 3.4 (L) 01/25/2024   GLUCOSE 100 (H) 01/25/2024   Lab Results  Component Value Date   CHOL 180 02/16/2021   HDL 47.20 02/16/2021   LDLCALC 110 (H) 02/16/2021   TRIG 113.0 02/16/2021   CHOLHDL 4 02/16/2021    Review of Systems See other pertinent positives and negatives in HPI.  Current Outpatient Medications on File Prior to Visit  Medication Sig Dispense Refill   Acetaminophen  (TYLENOL  PO) Take 500 mg by mouth 2 (two) times daily.     ALPRAZolam  (XANAX ) 0.25 MG tablet Take 1 tablet by mouth twice daily as needed for anxiety 50 tablet 3   anastrozole  (ARIMIDEX ) 1 MG tablet Take 1 tablet (1 mg total) by mouth daily. 100 tablet 1   atorvastatin  (LIPITOR) 10 MG tablet Take 1 tablet (10 mg total) by mouth daily. 90 tablet 2   Calcium -Vitamin D -Vitamin K 500-100-40 MG-UNT-MCG CHEW Chew 1 tablet by mouth daily. chewable     cetirizine (ZYRTEC)  10 MG chewable tablet Chew 10 mg by mouth daily.     Cholecalciferol  (VITAMIN D3) 125 MCG (5000 UT) CAPS Take 1 capsule by mouth daily.     cyanocobalamin  (VITAMIN B12) 500 MCG tablet Take 1,000 mcg by mouth daily.     cyclobenzaprine  (FLEXERIL ) 5 MG tablet Take 1 tablet (5 mg total) by mouth at bedtime. 90 tablet 1   famotidine  (PEPCID ) 40 MG tablet Take 1 tablet (40 mg total) by mouth at bedtime. 90 tablet 1   fluticasone  (FLONASE ) 50 MCG/ACT nasal spray Place 1  spray into both nostrils daily. 100 mL 2   fluticasone -salmeterol (WIXELA INHUB) 250-50 MCG/ACT AEPB Inhale 1 puff into the lungs in the morning and at bedtime. 180 each 3   levothyroxine  (SYNTHROID ) 25 MCG tablet TAKE 1 TABLET BY MOUTH ONCE DAILY BEFORE BREAKFAST 90 tablet 3   Magnesium 250 MG TABS Take 1 tablet by mouth daily.     meclizine  (ANTIVERT ) 12.5 MG tablet Take 1 tablet (12.5 mg total) by mouth 3 (three) times daily as needed for dizziness. 60 tablet 1   Multiple Vitamin (MULTIVITAMIN) tablet Take 1 tablet by mouth daily.     pantoprazole  (PROTONIX ) 40 MG tablet Take 1 tablet (40 mg total) by mouth daily before breakfast. 90 tablet 1   PARoxetine  (PAXIL ) 10 MG tablet Take 1 tablet (10 mg total) by mouth daily. 90 tablet 2   propranolol  (INDERAL ) 40 MG tablet Take 1 tablet (40 mg total) by mouth 2 (two) times daily. 180 tablet 2   RESTASIS 0.05 % ophthalmic emulsion Place 1 drop into both eyes 2 (two) times daily.     VOLTAREN  1 % GEL 2 g as needed.     No current facility-administered medications on file prior to visit.    Past Medical History:  Diagnosis Date   Actinic keratosis    Anxiety    Arthritis    Back pain    Breast cancer (HCC)    Cancer (HCC)    basal and squamous cell skin   Chicken pox    Chronic back pain    Complication of anesthesia    hard to wake up after lap cho-had to stay overnight-bp and hr dropped   COPD (chronic obstructive pulmonary disease) (HCC)    Depression    Dyspnea    due to copd   Gallbladder problem    GERD (gastroesophageal reflux disease)    Goiter    Hypertension    Hypothyroidism    Joint pain    Lactose intolerance    Migraines    Osteoarthritis    PONV (postoperative nausea and vomiting)    very nauseated   SOB (shortness of breath)    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 R upper lip   Thyroid  disease    Thyroid  nodule    Tremor    Vitamin D  deficiency    Allergies  Allergen Reactions   Sulfa  Antibiotics Rash    Rash all over body/fever   Hydrocodone Nausea Only    Per patient    Meloxicam Other (See Comments)    skin infection on leg    Social History   Socioeconomic History   Marital status: Single    Spouse name: Not on file   Number of children: 0   Years of education: 12   Highest education level: Some college, no degree  Occupational History   Occupation: retired    Comment: administration  Tobacco  Use   Smoking status: Former    Current packs/day: 0.00    Average packs/day: 1.5 packs/day for 25.0 years (37.5 ttl pk-yrs)    Types: Cigarettes    Start date: 03/03/1965    Quit date: 03/03/1990    Years since quitting: 34.2   Smokeless tobacco: Never  Vaping Use   Vaping status: Never Used  Substance and Sexual Activity   Alcohol use: Not Currently   Drug use: No   Sexual activity: Never  Other Topics Concern   Not on file  Social History Narrative   Lives alone   Caffeine- tea, 1 cup   Social Drivers of Health   Financial Resource Strain: Low Risk  (05/22/2024)   Overall Financial Resource Strain (CARDIA)    Difficulty of Paying Living Expenses: Not very hard  Food Insecurity: No Food Insecurity (05/22/2024)   Hunger Vital Sign    Worried About Running Out of Food in the Last Year: Never true    Ran Out of Food in the Last Year: Never true  Transportation Needs: No Transportation Needs (05/22/2024)   PRAPARE - Administrator, Civil Service (Medical): No    Lack of Transportation (Non-Medical): No  Physical Activity: Inactive (05/22/2024)   Exercise Vital Sign    Days of Exercise per Week: 0 days    Minutes of Exercise per Session: Not on file  Stress: No Stress Concern Present (05/22/2024)   [employer] of Occupational Health - Occupational Stress Questionnaire    Feeling of Stress: Only a little  Social Connections: Socially Isolated (05/22/2024)   Social Connection and Isolation Panel    Frequency of Communication with  Friends and Family: Once a week    Frequency of Social Gatherings with Friends and Family: Once a week    Attends Religious Services: Never    Database administrator or Organizations: No    Attends Engineer, structural: Not on file    Marital Status: Divorced    Vitals:   05/22/24 1431  BP: 118/70  Pulse: 72  Resp: 16  Temp: 97.9 F (36.6 C)  SpO2: 97%   Body mass index is 35.15 kg/m.  Physical Exam Vitals and nursing note reviewed.  Constitutional:      General: She is not in acute distress.    Appearance: She is well-developed.  HENT:     Head: Normocephalic and atraumatic.     Right Ear: Tympanic membrane, ear canal and external ear normal.     Left Ear: Tympanic membrane, ear canal and external ear normal.     Mouth/Throat:     Mouth: Mucous membranes are moist.     Pharynx: Oropharynx is clear.  Eyes:     Conjunctiva/sclera: Conjunctivae normal.  Cardiovascular:     Rate and Rhythm: Normal rate and regular rhythm.     Pulses:          Dorsalis pedis pulses are 2+ on the right side and 2+ on the left side.     Heart sounds: No murmur heard. Pulmonary:     Effort: Pulmonary effort is normal. No respiratory distress.     Breath sounds: Normal breath sounds.  Abdominal:     Palpations: Abdomen is soft. There is no hepatomegaly or mass.     Tenderness: There is no abdominal tenderness.  Lymphadenopathy:     Cervical: No cervical adenopathy.  Skin:    General: Skin is warm.     Findings: No erythema or  rash.  Neurological:     General: No focal deficit present.     Mental Status: She is alert and oriented to person, place, and time.     Cranial Nerves: No cranial nerve deficit.     Gait: Gait normal.  Psychiatric:     Comments: Well groomed, good eye contact.     ASSESSMENT AND PLAN: Essential hypertension    No follow-ups on file.  Leighton Luster G. [place], MD  [facility]. [place] office.  Discharge Instructions   None

## 2024-05-22 NOTE — Telephone Encounter (Unsigned)
 Copied from CRM [id number]. Topic: Clinical - Medication Refill >> May 22, 2024  3:50 PM Mesmerise C wrote: Medication:  atorvastatin  (LIPITOR) 10 MG tablet   Has the patient contacted their pharmacy? Yes (Agent: If no, request that the patient contact the pharmacy for the refill. If patient does not wish to contact the pharmacy document the reason why and proceed with request.) (Agent: If yes, when and what did the pharmacy advise?) Pharmacy calling This is the patient's preferred pharmacy:   [facility] [place]  - [place], [place] - [address] [facility] [address] [place] [address] Phone: [phone] Fax: [phone]   Is this the correct pharmacy for this prescription? Yes If no, delete pharmacy and type the correct one.   Has the prescription been filled recently? No  Is the patient out of the medication? Pharmacy calling for refill  Has the patient been seen for an appointment in the last year OR does the patient have an upcoming appointment? Yes  Can we respond through MyChart? Yes  Agent: Please be advised that Rx refills may take up to 3 business days. We ask that you follow-up with your pharmacy.

## 2024-05-22 NOTE — Patient Instructions (Addendum)
 A few things to remember from today's visit:  Essential hypertension  Lightheadedness - Plan: Basic metabolic panel with GFR, CBC  Other specified anxiety disorders  Goiter, non-toxic - Plan: TSH, T4, free Monitor for new symptoms. Blood pressure reading sin 2 weeks.  If you need refills for medications you take chronically, please call your pharmacy. Do not use My Chart to request refills or for acute issues that need immediate attention. If you send a my chart message, it may take a few days to be addressed, specially if I am not in the office.  Please be sure medication list is accurate. If a new problem present, please set up appointment sooner than planned today.

## 2024-05-23 ENCOUNTER — Encounter: Payer: Self-pay | Admitting: Oncology

## 2024-05-23 LAB — CBC
HCT: 37.5 % (ref 36.0–46.0)
Hemoglobin: 12.5 g/dL (ref 12.0–15.0)
MCHC: 33.4 g/dL (ref 30.0–36.0)
MCV: 93.4 fl (ref 78.0–100.0)
Platelets: 242 K/uL (ref 150.0–400.0)
RBC: 4.01 Mil/uL (ref 3.87–5.11)
RDW: 13.5 % (ref 11.5–15.5)
WBC: 7.3 K/uL (ref 4.0–10.5)

## 2024-05-23 LAB — T4, FREE: Free T4: 0.96 ng/dL (ref 0.60–1.60)

## 2024-05-23 LAB — BASIC METABOLIC PANEL WITH GFR
BUN: 15 mg/dL (ref 6–23)
CO2: 25 meq/L (ref 19–32)
Calcium: 9.1 mg/dL (ref 8.4–10.5)
Chloride: 105 meq/L (ref 96–112)
Creatinine, Ser: 0.99 mg/dL (ref 0.40–1.20)
GFR: 56.38 mL/min — ABNORMAL LOW (ref >60.00)
Glucose, Bld: 114 mg/dL — ABNORMAL HIGH (ref 70–99)
Potassium: 3.9 meq/L (ref 3.5–5.1)
Sodium: 139 meq/L (ref 135–145)

## 2024-05-23 LAB — TSH: TSH: 1.64 u[IU]/mL (ref 0.35–5.50)

## 2024-05-23 MED ORDER — ATORVASTATIN CALCIUM 10 MG PO TABS: 10.0000 mg | ORAL_TABLET | Freq: Every day | ORAL | 2 refills | Status: AC

## 2024-05-24 ENCOUNTER — Ambulatory Visit: Payer: Self-pay | Admitting: Family Medicine

## 2024-05-24 NOTE — Assessment & Plan Note (Addendum)
 Currently on Paroxetine  10 mg daily and Alprazolam  0.25 mg bid prn. PDMP reviewed. No changes today. F/U in 6 months.

## 2024-05-24 NOTE — Assessment & Plan Note (Addendum)
 Reporting a few elevated SBP's at home, today adequate BP. She has not been checking BP regularly for some time, so before we decided to make changes, recommend doing so for 2 weeks, let me know about readings. Continue Propranolol  40 mg bid and low salt diet.

## 2024-05-24 NOTE — Assessment & Plan Note (Signed)
 Last TSH 1.4 in 02/2023. Well controlled. Continue Levothyroxine  25 mcg daily.

## 2024-06-04 ENCOUNTER — Other Ambulatory Visit: Payer: Self-pay

## 2024-06-04 MED ORDER — ANASTROZOLE 1 MG PO TABS: 1.0000 mg | ORAL_TABLET | Freq: Every day | ORAL | 1 refills | Status: DC

## 2024-06-06 ENCOUNTER — Ambulatory Visit: Admitting: Podiatry

## 2024-06-06 DIAGNOSIS — L600 Ingrowing nail: Secondary | ICD-10-CM | POA: Diagnosis not present

## 2024-06-06 MED ORDER — NEOMYCIN-POLYMYXIN-HC 3.5-10000-1 OT SOLN: OTIC | 0 refills | Status: AC

## 2024-06-06 NOTE — Patient Instructions (Signed)

## 2024-06-06 NOTE — Progress Notes (Signed)
 She presents today chief complaint of ingrown toenail to the fibular border hallux left.  She states that it hurts at the tip of the toe not along the side itself.  Objective: Vital signs are stable oriented x 3 there is erythema and edema and a drying abscess to the distal aspect of the fibular border of the left hallux.  Assessment: Ingrown nail fibular border left hallux.  Plan: Chemical matricectomy was performed today after local anesthetic was administered.  She tolerated procedure well as the nail was split from distal to proximal avulsed and exposing the matrix.  3 applications of phenol were applied to the matrix and the nailbed 30 seconds each neutralized with isopropyl alcohol Silvadene cream Telfa pad and dressed compressive dressing were applied.  She was given both oral and written home-going strips of the care and soaking of the foot as well as prescription for Cortisporin Otic to be applied twice daily after soaking.  Follow-up with her in 2 weeks

## 2024-06-11 ENCOUNTER — Encounter: Payer: Self-pay | Admitting: Family Medicine

## 2024-06-11 ENCOUNTER — Other Ambulatory Visit: Payer: Self-pay | Admitting: Medical Genetics

## 2024-06-11 DIAGNOSIS — Z006 Encounter for examination for normal comparison and control in clinical research program: Secondary | ICD-10-CM

## 2024-06-15 ENCOUNTER — Other Ambulatory Visit: Payer: Self-pay | Admitting: Family Medicine

## 2024-06-15 DIAGNOSIS — I10 Essential (primary) hypertension: Secondary | ICD-10-CM

## 2024-06-15 MED ORDER — LOSARTAN POTASSIUM 25 MG PO TABS: 25.0000 mg | ORAL_TABLET | Freq: Every day | ORAL | 0 refills | Status: DC

## 2024-06-18 NOTE — Addendum Note (Signed)
 Addended by: DIONISIO CAMELIA PARAS on: 06/18/2024 10:30 AM   Modules accepted: Orders

## 2024-06-25 ENCOUNTER — Encounter: Payer: Self-pay | Admitting: Family Medicine

## 2024-06-27 ENCOUNTER — Other Ambulatory Visit: Payer: Self-pay | Admitting: Family Medicine

## 2024-06-28 ENCOUNTER — Other Ambulatory Visit: Payer: Self-pay | Admitting: Family Medicine

## 2024-06-28 DIAGNOSIS — G4700 Insomnia, unspecified: Secondary | ICD-10-CM

## 2024-06-28 DIAGNOSIS — F418 Other specified anxiety disorders: Secondary | ICD-10-CM

## 2024-06-28 DIAGNOSIS — E038 Other specified hypothyroidism: Secondary | ICD-10-CM

## 2024-06-28 LAB — BASIC METABOLIC PANEL WITH GFR
BUN/Creatinine Ratio: 16 (ref 12–28)
BUN: 14 mg/dL (ref 8–27)
CO2: 22 mmol/L (ref 20–29)
Calcium: 9.1 mg/dL (ref 8.7–10.3)
Chloride: 102 mmol/L (ref 96–106)
Creatinine, Ser: 0.9 mg/dL (ref 0.57–1.00)
Glucose: 99 mg/dL (ref 70–99)
Potassium: 4.3 mmol/L (ref 3.5–5.2)
Sodium: 136 mmol/L (ref 134–144)
eGFR: 67 mL/min/1.73 (ref >59)

## 2024-06-29 ENCOUNTER — Ambulatory Visit: Payer: Self-pay | Admitting: Family Medicine

## 2024-06-29 ENCOUNTER — Encounter: Payer: Self-pay | Admitting: Oncology

## 2024-07-02 ENCOUNTER — Other Ambulatory Visit: Payer: Self-pay

## 2024-07-02 DIAGNOSIS — R062 Wheezing: Secondary | ICD-10-CM

## 2024-07-02 MED ORDER — FLUTICASONE PROPIONATE 50 MCG/ACT NA SUSP: 1.0000 | Freq: Every day | NASAL | 11 refills | Status: AC

## 2024-07-02 NOTE — Progress Notes (Signed)
 Received a fax form [facility] asking for a refill on Flonase .   Refill has been sent in.   Nothing further needed

## 2024-07-09 LAB — GENECONNECT MOLECULAR SCREEN: Genetic Analysis Overall Interpretation: NEGATIVE

## 2024-07-26 ENCOUNTER — Other Ambulatory Visit

## 2024-07-26 ENCOUNTER — Ambulatory Visit: Admitting: Oncology

## 2024-07-31 ENCOUNTER — Inpatient Hospital Stay: Admitting: Oncology

## 2024-07-31 ENCOUNTER — Encounter: Payer: Self-pay | Admitting: Oncology

## 2024-07-31 ENCOUNTER — Inpatient Hospital Stay: Attending: Oncology

## 2024-07-31 ENCOUNTER — Other Ambulatory Visit: Payer: Self-pay

## 2024-07-31 VITALS — BP 127/71 | HR 65 | Temp 98.0°F | Resp 18 | Ht 62.0 in | Wt 192.2 lb

## 2024-07-31 DIAGNOSIS — Z5181 Encounter for therapeutic drug level monitoring: Secondary | ICD-10-CM

## 2024-07-31 DIAGNOSIS — M85851 Other specified disorders of bone density and structure, right thigh: Secondary | ICD-10-CM

## 2024-07-31 DIAGNOSIS — Z923 Personal history of irradiation: Secondary | ICD-10-CM | POA: Insufficient documentation

## 2024-07-31 DIAGNOSIS — Z1721 Progesterone receptor positive status: Secondary | ICD-10-CM | POA: Insufficient documentation

## 2024-07-31 DIAGNOSIS — Z808 Family history of malignant neoplasm of other organs or systems: Secondary | ICD-10-CM | POA: Insufficient documentation

## 2024-07-31 DIAGNOSIS — Z170 Estrogen receptor positive status [ER+]: Secondary | ICD-10-CM | POA: Diagnosis not present

## 2024-07-31 DIAGNOSIS — C50911 Malignant neoplasm of unspecified site of right female breast: Secondary | ICD-10-CM | POA: Diagnosis present

## 2024-07-31 DIAGNOSIS — Z853 Personal history of malignant neoplasm of breast: Secondary | ICD-10-CM | POA: Diagnosis not present

## 2024-07-31 DIAGNOSIS — C50211 Malignant neoplasm of upper-inner quadrant of right female breast: Secondary | ICD-10-CM

## 2024-07-31 DIAGNOSIS — Z87891 Personal history of nicotine dependence: Secondary | ICD-10-CM | POA: Insufficient documentation

## 2024-07-31 DIAGNOSIS — Z79811 Long term (current) use of aromatase inhibitors: Secondary | ICD-10-CM | POA: Diagnosis not present

## 2024-07-31 DIAGNOSIS — Z08 Encounter for follow-up examination after completed treatment for malignant neoplasm: Secondary | ICD-10-CM

## 2024-07-31 DIAGNOSIS — Z803 Family history of malignant neoplasm of breast: Secondary | ICD-10-CM | POA: Insufficient documentation

## 2024-07-31 DIAGNOSIS — Z1732 Human epidermal growth factor receptor 2 negative status: Secondary | ICD-10-CM | POA: Diagnosis not present

## 2024-07-31 LAB — CMP (CANCER CENTER ONLY)
ALT: 33 U/L (ref 0–44)
AST: 32 U/L (ref 15–41)
Albumin: 3.7 g/dL (ref 3.5–5.0)
Alkaline Phosphatase: 87 U/L (ref 38–126)
Anion gap: 10 (ref 5–15)
BUN: 17 mg/dL (ref 8–23)
CO2: 23 mmol/L (ref 22–32)
Calcium: 9.2 mg/dL (ref 8.9–10.3)
Chloride: 103 mmol/L (ref 98–111)
Creatinine: 1.02 mg/dL — ABNORMAL HIGH (ref 0.44–1.00)
GFR, Estimated: 57 mL/min — ABNORMAL LOW
Glucose, Bld: 119 mg/dL — ABNORMAL HIGH (ref 70–99)
Potassium: 4.3 mmol/L (ref 3.5–5.1)
Sodium: 136 mmol/L (ref 135–145)
Total Bilirubin: 0.6 mg/dL (ref 0.0–1.2)
Total Protein: 6.6 g/dL (ref 6.5–8.1)

## 2024-07-31 LAB — CBC WITH DIFFERENTIAL (CANCER CENTER ONLY)
Abs Immature Granulocytes: 0.03 K/uL (ref 0.00–0.07)
Basophils Absolute: 0 K/uL (ref 0.0–0.1)
Basophils Relative: 1 %
Eosinophils Absolute: 0.2 K/uL (ref 0.0–0.5)
Eosinophils Relative: 4 %
HCT: 36.8 % (ref 36.0–46.0)
Hemoglobin: 12.3 g/dL (ref 12.0–15.0)
Immature Granulocytes: 0 %
Lymphocytes Relative: 19 %
Lymphs Abs: 1.3 K/uL (ref 0.7–4.0)
MCH: 31.3 pg (ref 26.0–34.0)
MCHC: 33.4 g/dL (ref 30.0–36.0)
MCV: 93.6 fL (ref 80.0–100.0)
Monocytes Absolute: 0.8 K/uL (ref 0.1–1.0)
Monocytes Relative: 11 %
Neutro Abs: 4.6 K/uL (ref 1.7–7.7)
Neutrophils Relative %: 65 %
Platelet Count: 212 K/uL (ref 150–400)
RBC: 3.93 MIL/uL (ref 3.87–5.11)
RDW: 12.8 % (ref 11.5–15.5)
WBC Count: 6.9 K/uL (ref 4.0–10.5)
nRBC: 0 % (ref 0.0–0.2)

## 2024-07-31 MED ORDER — ALENDRONATE SODIUM 70 MG PO TABS: 70.0000 mg | ORAL_TABLET | ORAL | 1 refills | Status: AC

## 2024-07-31 NOTE — Progress Notes (Signed)
 Scar from lumpectomy very hard, sometimes bothers sleeping.  New medication: Imodium.  Has 3-5 bowel movements daily, starts out firm and then goes to diarrhea; takes imodium as needed.

## 2024-07-31 NOTE — Progress Notes (Signed)
 "    Hematology/Oncology Consult note [facility]  Telephone:([phone] [phone]  Patient Care Team: Jordan, Betty G, MD as PCP - General (Family Medicine) Liane Sharyne MATSU, [facility] (Inactive) as Pharmacist (Pharmacist) Georgina Shasta POUR, RN as Oncology Nurse Navigator Babara Call, MD as Consulting Physician (Oncology) Lenn Aran, MD as Consulting Physician (Radiation Oncology) Melanee Annah BROCKS, MD as Consulting Physician (Oncology)   Name of the patient: Kristina [name]  [id number]  01-11-1951   Date of visit: 07/31/2024  Diagnosis-  Cancer Staging  Breast cancer in female [facility]) Staging form: Breast, AJCC 8th Edition - Clinical stage from 05/07/2022: Stage IA (cT1c, cN0, cM0, G2, ER+, PR+, HER2-) - Signed by Babara Call, MD on 05/07/2022 Stage prefix: Initial diagnosis Histologic grading system: 3 grade system - Pathologic stage from 05/24/2022: Stage IA (pT1b, pN0, cM0, G2, ER+, PR+, HER2-, Oncotype DX score: 34) - Signed by Babara Call, MD on 06/15/2022 Multigene prognostic tests performed: Oncotype DX Recurrence score range: Greater than or equal to 11 Histologic grading system: 3 grade system    Chief complaint/ Reason for visit-routine surveillance visit for breast cancer  Heme/Onc history: Patient is a 73 year old female with breast cancer history as follows: Right breast invasive carcinoma, 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  S/p adjuvant radiation of right breast.  Interval history- Overall she is tolerating Arimidex  well along with calcium  and vitamin D .  Denies any breast concerns today.  Denies any changes in her appetite or weight.  She has mild chronic right hip pain which has remained stable  History of Present Illness  ECOG PS- 1 Pain scale- 0   Review of  systems- Review of Systems  Constitutional:  Negative for chills, fever, malaise/fatigue and weight loss.  HENT:  Negative for congestion, ear discharge and nosebleeds.   Eyes:  Negative for blurred vision.  Respiratory:  Negative for cough, hemoptysis, sputum production, shortness of breath and wheezing.   Cardiovascular:  Negative for chest pain, palpitations, orthopnea and claudication.  Gastrointestinal:  Negative for abdominal pain, blood in stool, constipation, diarrhea, heartburn, melena, nausea and vomiting.  Genitourinary:  Negative for dysuria, flank pain, frequency, hematuria and urgency.  Musculoskeletal:  Negative for back pain, joint pain and myalgias.  Skin:  Negative for rash.  Neurological:  Negative for dizziness, tingling, focal weakness, seizures, weakness and headaches.  Endo/Heme/Allergies:  Does not bruise/bleed easily.  Psychiatric/Behavioral:  Negative for depression and suicidal ideas. The patient does not have insomnia.       Allergies[1]   Past Medical History:  Diagnosis Date   Actinic keratosis    Anxiety    Arthritis    Back pain    Breast cancer (HCC)    Cancer (HCC)    basal and squamous cell skin   Chicken pox    Chronic back pain    Complication of anesthesia    hard to wake up after lap cho-had to stay overnight-bp and hr dropped   COPD (chronic obstructive pulmonary disease) (HCC)    Depression    Dyspnea    due to copd   Gallbladder problem    GERD (gastroesophageal reflux disease)    Goiter    Hypertension    Hypothyroidism    Joint pain    Lactose intolerance    Migraines    Osteoarthritis  PONV (postoperative nausea and vomiting)    very nauseated   SOB (shortness of breath)    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 R upper lip   Thyroid  disease    Thyroid  nodule    Tremor    Vitamin D  deficiency      Past Surgical History:  Procedure Laterality Date   ABDOMINAL HYSTERECTOMY  1978   BIOPSY  03/29/2023    Procedure: BIOPSY;  Surgeon: Unk Corinn Skiff, MD;  Location: [facility] ENDOSCOPY;  Service: Gastroenterology;;   BREAST LUMPECTOMY Right 05/24/2022   BREAST LUMPECTOMY,RADIO FREQ LOCALIZER,AXILLARY SENTINEL LYMPH NODE BIOPSY Right 05/24/2022   Procedure: BREAST LUMPECTOMY,RADIO FREQ LOCALIZER,AXILLARY SENTINEL LYMPH NODE BIOPSY;  Surgeon: Lane Shope, MD;  Location: [facility] ORS;  Service: General;  Laterality: Right;   CATARACT EXTRACTION Left 12/20/2017   will have the right one completed a month later    CATARACT EXTRACTION Right    CHOLECYSTECTOMY  2000   COLONOSCOPY  2007   COLONOSCOPY     2018   COLONOSCOPY WITH PROPOFOL  N/A 03/29/2023   Procedure: COLONOSCOPY WITH PROPOFOL ;  Surgeon: Unk Corinn Skiff, MD;  Location: [facility] ENDOSCOPY;  Service: Gastroenterology;  Laterality: N/A;   ESOPHAGOGASTRODUODENOSCOPY N/A 03/29/2023   Procedure: ESOPHAGOGASTRODUODENOSCOPY (EGD);  Surgeon: Unk Corinn Skiff, MD;  Location: [facility] ENDOSCOPY;  Service: Gastroenterology;  Laterality: N/A;   JOINT REPLACEMENT Right 08/19/2017   great toe    POLYPECTOMY  03/29/2023   Procedure: POLYPECTOMY;  Surgeon: Unk Corinn Skiff, MD;  Location: [facility] ENDOSCOPY;  Service: Gastroenterology;;   TOE SURGERY     UPPER GASTROINTESTINAL ENDOSCOPY  [phone]   UPPER GI ENDOSCOPY     2014    Social History   Socioeconomic History   Marital status: Single    Spouse name: Not on file   Number of children: 0   Years of education: 12   Highest education level: Some college, no degree  Occupational History   Occupation: retired    Comment: administration  Tobacco Use   Smoking status: Former    Current packs/day: 0.00    Average packs/day: 1.5 packs/day for 25.0 years (37.5 ttl pk-yrs)    Types: Cigarettes    Start date: 03/03/1965    Quit date: 03/03/1990    Years since quitting: 34.4   Smokeless tobacco: Never  Vaping Use   Vaping status: Never Used  Substance and Sexual Activity   Alcohol use: Not  Currently   Drug use: No   Sexual activity: Never  Other Topics Concern   Not on file  Social History Narrative   Lives alone   Caffeine- tea, 1 cup   Social Drivers of Health   Tobacco Use: Medium Risk (07/31/2024)   Patient History    Smoking Tobacco Use: Former    Smokeless Tobacco Use: Never    Passive Exposure: Not on Actuary Strain: Low Risk (05/22/2024)   Overall Financial Resource Strain (CARDIA)    Difficulty of Paying Living Expenses: Not very hard  Food Insecurity: No Food Insecurity (05/22/2024)   Epic    Worried About Radiation Protection Practitioner of Food in the Last Year: Never true    Ran Out of Food in the Last Year: Never true  Transportation Needs: No Transportation Needs (05/22/2024)   Epic    Lack of Transportation (Medical): No    Lack of Transportation (Non-Medical): No  Physical Activity: Inactive (05/22/2024)   Exercise Vital Sign    Days of Exercise per Week:  0 days    Minutes of Exercise per Session: Not on file  Stress: No Stress Concern Present (05/22/2024)   [employer] of Occupational Health - Occupational Stress Questionnaire    Feeling of Stress: Only a little  Social Connections: Socially Isolated (05/22/2024)   Social Connection and Isolation Panel    Frequency of Communication with Friends and Family: Once a week    Frequency of Social Gatherings with Friends and Family: Once a week    Attends Religious Services: Never    Database Administrator or Organizations: No    Attends Engineer, Structural: Not on file    Marital Status: Divorced  Intimate Partner Violence: Not on file  Depression (PHQ2-9): Low Risk (07/31/2024)   Depression (PHQ2-9)    PHQ-2 Score: 0  Recent Concern: Depression (PHQ2-9) - High Risk (05/22/2024)   Depression (PHQ2-9)    PHQ-2 Score: 12  Alcohol Screen: Low Risk (05/22/2024)   Alcohol Screen    Last Alcohol Screening Score (AUDIT): 0  Housing: Low Risk (05/22/2024)   Epic    Unable to Pay for  Housing in the Last Year: No    Number of Times Moved in the Last Year: 0    Homeless in the Last Year: No  Utilities: Not on file  Health Literacy: Not on file    Family History  Problem Relation Age of Onset   Arthritis Mother    Diabetes Mother    COPD Mother    Heart failure Mother    Stroke Father    Hypertension Father    Hyperlipidemia Father    Diabetes Father    CAD Father    Heart disease Father    Heart failure Father    Cancer Sister        bladder cancer 2019  has come back again 2025   Throat cancer Brother    Breast cancer Maternal Aunt    Colon cancer Neg Hx    Stomach cancer Neg Hx    Esophageal cancer Neg Hx     Current Medications[2]  Physical exam:  Vitals:   07/31/24 1041 07/31/24 1047  BP: (!) 141/69 127/71  Pulse: 65   Resp: 18   Temp: 98 F (36.7 C)   TempSrc: Tympanic   SpO2: 98%   Weight: 192 lb 3.2 oz (87.2 kg)   Height: 5' 2 (1.575 m)    Physical Exam Cardiovascular:     Rate and Rhythm: Normal rate and regular rhythm.     Heart sounds: Normal heart sounds.  Pulmonary:     Effort: Pulmonary effort is normal.     Breath sounds: Normal breath sounds.  Skin:    General: Skin is warm and dry.  Neurological:     Mental Status: She is alert and oriented to person, place, and time.    Breast exam was performed in seated and lying down position. Patient is status post right lumpectomy with a well-healed surgical scar. No evidence of any palpable masses. No evidence of axillary adenopathy. No evidence of any palpable masses or lumps in the left breast. No evidence of leftt axillary adenopathy   I have personally reviewed labs listed below:    Latest Ref Rng & Units 07/31/2024   10:15 AM  CMP  Glucose 70 - 99 mg/dL 880   BUN 8 - 23 mg/dL 17   Creatinine 9.55 - 1.00 mg/dL 8.97   Sodium 864 - 854 mmol/L 136   Potassium 3.5 -  5.1 mmol/L 4.3   Chloride 98 - 111 mmol/L 103   CO2 22 - 32 mmol/L 23   Calcium  8.9 - 10.3 mg/dL 9.2    Total Protein 6.5 - 8.1 g/dL 6.6   Total Bilirubin 0.0 - 1.2 mg/dL 0.6   Alkaline Phos 38 - 126 U/L 87   AST 15 - 41 U/L 32   ALT 0 - 44 U/L 33       Latest Ref Rng & Units 07/31/2024   10:15 AM  CBC  WBC 4.0 - 10.5 K/uL 6.9   Hemoglobin 12.0 - 15.0 g/dL 87.6   Hematocrit 63.9 - 46.0 % 36.8   Platelets 150 - 400 K/uL 212      Assessment and plan- Patient is a 73 y.o. female with history of pathological prognostic stage Ia invasive mammary carcinoma of the right breast pT1b N0 M0 ER/PR positive HER2 negative status postlumpectomy and sentinel lymph node biopsy and adjuvant radiation therapy presently on Arimidex  here for a routine follow-up  Clinically patient is doing well with no concerning signs and symptoms of recurrence based on today's exam.  She is tolerating Arimidex  well so far and she has completed 2 years.  She has at least 3 more years to go but we could potentially consider giving her longer given that she had high Oncotype score.  She will also continue with calcium  and vitamin D  mammogram from September 2025 was unremarkable  Also reviewed the results of bone density scan from October 2025 which shows osteopenia in the right femur neck.  10-year probability of major osteoporotic fracture more than 20% and hip fracture more than 3%.  She would then therefore benefit from bisphosphonates in addition to calcium  and vitamin D .  Discussed risks and merits of both weekly Fosamax  versus yearly Reclast.  Patient prefers to proceed with weekly Fosamax  at this time.  Prescription will be sent to her pharmacy today.  I will see her back in 6 months no labs   Visit Diagnosis 1. Encounter for follow-up surveillance of breast cancer   2. Visit for monitoring Arimidex  therapy   3. Osteopenia of neck of right femur      Dr. Annah Skene, MD, MPH [facility] [phone] 07/31/2024 12:27 PM                   [1]  Allergies Allergen  Reactions   Sulfa Antibiotics Rash    Rash all over body/fever   Hydrocodone Nausea Only    Per patient    Meloxicam Other (See Comments)    skin infection on leg  [2]  Current Outpatient Medications:    Acetaminophen  (TYLENOL  PO), Take 500 mg by mouth 2 (two) times daily., Disp: , Rfl:    alendronate  (FOSAMAX ) 70 MG tablet, Take 1 tablet (70 mg total) by mouth once a week. Take with a full glass of water  on an empty stomach; sit upright for 2 hours. ., Disp: 30 tablet, Rfl: 1   ALPRAZolam  (XANAX ) 0.25 MG tablet, Take 1 tablet by mouth twice daily as needed for anxiety, Disp: 50 tablet, Rfl: 2   anastrozole  (ARIMIDEX ) 1 MG tablet, Take 1 tablet (1 mg total) by mouth daily., Disp: 30 tablet, Rfl: 1   atorvastatin  (LIPITOR) 10 MG tablet, Take 1 tablet (10 mg total) by mouth daily., Disp: 90 tablet, Rfl: 2   Calcium -Vitamin D -Vitamin K 500-100-40 MG-UNT-MCG CHEW, Chew 1 tablet by mouth daily. chewable, Disp: , Rfl:  cetirizine (ZYRTEC) 10 MG chewable tablet, Chew 10 mg by mouth daily., Disp: , Rfl:    Cholecalciferol  (VITAMIN D3) 125 MCG (5000 UT) CAPS, Take 1 capsule by mouth daily., Disp: , Rfl:    cyanocobalamin  (VITAMIN B12) 500 MCG tablet, Take 1,000 mcg by mouth daily., Disp: , Rfl:    cyclobenzaprine  (FLEXERIL ) 5 MG tablet, Take 1 tablet (5 mg total) by mouth at bedtime., Disp: 90 tablet, Rfl: 1   famotidine  (PEPCID ) 40 MG tablet, Take 1 tablet (40 mg total) by mouth at bedtime., Disp: 90 tablet, Rfl: 1   fluticasone  (FLONASE ) 50 MCG/ACT nasal spray, Place 1 spray into both nostrils daily., Disp: 100 mL, Rfl: 11   fluticasone -salmeterol (WIXELA INHUB) 250-50 MCG/ACT AEPB, Inhale 1 puff into the lungs in the morning and at bedtime., Disp: 180 each, Rfl: 3   levothyroxine  (SYNTHROID ) 25 MCG tablet, TAKE 1 TABLET BY MOUTH ONCE DAILY BEFORE BREAKFAST, Disp: 90 tablet, Rfl: 2   loperamide (IMODIUM A-D) 2 MG tablet, Take 2 mg by mouth as needed for diarrhea or loose stools., Disp: , Rfl:     losartan  (COZAAR ) 25 MG tablet, Take 1 tablet (25 mg total) by mouth daily., Disp: 90 tablet, Rfl: 0   Magnesium 250 MG TABS, Take 1 tablet by mouth daily., Disp: , Rfl:    meclizine  (ANTIVERT ) 12.5 MG tablet, Take 1 tablet (12.5 mg total) by mouth 3 (three) times daily as needed for dizziness., Disp: 60 tablet, Rfl: 1   Multiple Vitamin (MULTIVITAMIN) tablet, Take 1 tablet by mouth daily., Disp: , Rfl:    pantoprazole  (PROTONIX ) 40 MG tablet, Take 1 tablet (40 mg total) by mouth daily before breakfast., Disp: 90 tablet, Rfl: 1   PARoxetine  (PAXIL ) 10 MG tablet, Take 1 tablet (10 mg total) by mouth daily., Disp: 90 tablet, Rfl: 2   propranolol  (INDERAL ) 40 MG tablet, Take 1 tablet (40 mg total) by mouth 2 (two) times daily., Disp: 180 tablet, Rfl: 2   RESTASIS 0.05 % ophthalmic emulsion, Place 1 drop into both eyes 2 (two) times daily., Disp: , Rfl:    VOLTAREN  1 % GEL, 2 g as needed., Disp: , Rfl:    neomycin -polymyxin-hydrocortisone (CORTISPORIN) OTIC solution, 1-2 drops to toe twice daily after soaking (Patient not taking: Reported on 07/31/2024), Disp: 10 mL, Rfl: 0  "

## 2024-08-04 ENCOUNTER — Encounter: Payer: Self-pay | Admitting: Oncology

## 2024-08-06 NOTE — Telephone Encounter (Signed)
 Jereld you can call her and let her know that it would be yearly Reclast 5 mg and we typically require dental clearance prior to giving Reclast given the risk of osteonecrosis of the jaw.  You can email her the link for drug information about Reclast as well

## 2024-08-06 NOTE — Telephone Encounter (Signed)
 Per Dr. Melanee Bence you can call her and let her know that it would be yearly Reclast 5 mg and we typically require dental clearance prior to giving Reclast given the risk of osteonecrosis of the jaw. You can email her the link for drug information about Reclast as well.  Voicemail left indicating my chart message would be sent covering the details that would have been discussed.

## 2024-08-08 ENCOUNTER — Other Ambulatory Visit: Payer: Self-pay | Admitting: Oncology

## 2024-08-08 ENCOUNTER — Encounter: Payer: Self-pay | Admitting: Oncology

## 2024-08-13 ENCOUNTER — Encounter: Payer: Self-pay | Admitting: Oncology

## 2024-08-13 NOTE — Telephone Encounter (Signed)
 Kristina [name] responded to patient directly and sent an estimate via MyChart to Kristina [name]. her out of pocket for Reclast should be around $40

## 2024-08-17 MED ORDER — ANASTROZOLE 1 MG PO TABS: 1.0000 mg | ORAL_TABLET | Freq: Every day | ORAL | 1 refills | Status: AC

## 2024-08-17 MED ORDER — ANASTROZOLE 1 MG PO TABS: 1.0000 mg | ORAL_TABLET | Freq: Every day | ORAL | 3 refills | Status: DC

## 2024-08-17 NOTE — Addendum Note (Signed)
 Addended by: FAUSTINO BENCE on: 08/17/2024 02:30 PM   Modules accepted: Orders

## 2024-08-22 ENCOUNTER — Encounter: Payer: Self-pay | Admitting: Family Medicine

## 2024-08-23 ENCOUNTER — Other Ambulatory Visit: Payer: Self-pay | Admitting: Family Medicine

## 2024-08-23 DIAGNOSIS — I10 Essential (primary) hypertension: Secondary | ICD-10-CM

## 2024-08-24 ENCOUNTER — Encounter: Payer: Self-pay | Admitting: Pulmonary Disease

## 2024-08-24 ENCOUNTER — Ambulatory Visit: Admitting: Pulmonary Disease

## 2024-08-24 VITALS — BP 120/82 | HR 79 | Temp 98.5°F | Ht 62.0 in | Wt 194.6 lb

## 2024-08-24 DIAGNOSIS — Z87891 Personal history of nicotine dependence: Secondary | ICD-10-CM | POA: Diagnosis not present

## 2024-08-24 DIAGNOSIS — J45991 Cough variant asthma: Secondary | ICD-10-CM

## 2024-08-24 DIAGNOSIS — K219 Gastro-esophageal reflux disease without esophagitis: Secondary | ICD-10-CM

## 2024-08-24 NOTE — Progress Notes (Signed)
 "  Synopsis: Referred in by Jordan, Betty G, MD   Subjective:   PATIENT ID: Kristina [name] Hazel GENDER: female DOB: Oct 22, 1950, MRN: [id number]  Chief Complaint  Patient presents with   Shortness of Breath    DOE. No wheezing. Occasional cough at night. Wixela- BID, helps with her breathing. Flonase - once a day, does have red blood when she blows her nose in the morning.     HPI Kristina [name] is a pleasant 74 years old female patient with a past medical history of hyperlipidemia, hypothyroidism, anxiety, breast cancer in 2023 s/p lumpectomy, radiation and currently on Arimidex   presenting today to the pulmonary clinic for ongoing shortness of breath and wheezing.   She reports that since October she started having persistent cough described as coughing spells.  Unsure what triggers it.  She reports that she does not have GERD and after her endoscopy she was placed on high-dose PPI which did not help.  In October they tapered her to 1 time daily and she noticed her symptoms coming back.  She does report wheezing mostly at night when she lays flat.  Furthermore she does report hypersensitivity to strong scents.  She denies any shortness of breath.  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  CT chest 10/2023 without any signs of ILD.   Family history -father with lung cancer who was a heavy smoker.  Social history -she smoked 1 pack/day for for about 25 years she quit in 1992.  She lives at home alone and has 1 cat.  She has no kids.  She worked as an research scientist (medical) but currently retired.  OV 02/16/2024 - Kristina [name] is here to follow up on her PFTs. She had a significant response with Wixela and her cough has completely resolved. PFTs are normal. We discussed that she should continue with Wixela for at least the next 6 monhts at which point we will decide on tapering.   OV 08/24/2024 -Ms. Curl is here to follow-up on her cough variant asthma.   She has been doing well on Wixela 250 twice daily.  Has not required the use of her rescue inhaler.  Her cough has subsided completely.  We discussed tapering strategy which she is agreeable with.  Furthermore she has been having some nosebleeds and discussed with her to stop using her Flonase  for the time being.  I will follow-up with her in 6 months. ROS All symptoms were reviewed and are negative except for the above.  Objective:   There were no vitals filed for this visit.    on RA BMI Readings from Last 3 Encounters:  07/31/24 35.15 kg/m  05/22/24 35.15 kg/m  02/16/24 34.31 kg/m   Wt Readings from Last 3 Encounters:  07/31/24 192 lb 3.2 oz (87.2 kg)  05/22/24 192 lb 3.2 oz (87.2 kg)  02/16/24 187 lb 9.6 oz (85.1 kg)    Physical Exam GEN: NAD, Healthy Appearing HEENT: Supple Neck, Reactive Pupils, EOMI, Erythematous nasal mucosae.  CVS: Normal S1, Normal S2, RRR, No murmurs or ES appreciated  Lungs: Clear bilateral air entry.  Abdomen: Soft, non tender, non distended, + BS  Extremities: Warm and well perfused, No edema  Skin: No suspicious lesions appreciated  Psych: Normal Affect  Ancillary Information   CBC    Component Value Date/Time   WBC 6.9 07/31/2024 1015   WBC 7.3 05/22/2024 1526   RBC 3.93 07/31/2024 1015   HGB 12.3 07/31/2024 1015  HGB 12.5 02/07/2023 1344   HCT 36.8 07/31/2024 1015   HCT 38.9 02/07/2023 1344   PLT 212 07/31/2024 1015   PLT 235 02/07/2023 1344   MCV 93.6 07/31/2024 1015   MCV 94 02/07/2023 1344   MCH 31.3 07/31/2024 1015   MCHC 33.4 07/31/2024 1015   RDW 12.8 07/31/2024 1015   RDW 12.8 02/07/2023 1344   LYMPHSABS 1.3 07/31/2024 1015   MONOABS 0.8 07/31/2024 1015   EOSABS 0.2 07/31/2024 1015   BASOSABS 0.0 07/31/2024 1015   Labs and imaging were reviewed.     Latest Ref Rng & Units 02/14/2024   10:37 AM  PFT Results  FVC-Pre L 2.62   FVC-Predicted Pre % 98   Pre FEV1/FVC % % 79   FEV1-Pre L 2.07   FEV1-Predicted Pre % 104    DLCO uncorrected ml/min/mmHg 18.53   DLCO UNC% % 104   DLVA Predicted % 90   TLC L 5.34   TLC % Predicted % 112   RV % Predicted % 112      Assessment & Plan:  Kristina [name] is a pleasant 74 years old female patient with a past medical history of hyperlipidemia, hypothyroidism, anxiety, breast cancer in 2023 s/p lumpectomy, radiation and currently on Arimidex   presenting today to the pulmonary clinic for ongoing shortness of breath and wheezing.   #Cough variant asthma  FENO 48 --> FENO 28 02/16/2024 indicating good compliance to inhaler treatment.  EOS 400  Normal PFTs.    []  Start tapering fluticasone -Salmeterol [Wixela] 250-50 1 puff BID. Advised mouth rinsing after each use.  []  c/w Albuterol  on an as needed basis.  []  Discontinue Flonase  1spray each nostril twice a day.   #GERD  Currently on Protonix  40mg  PO daily and Famotidine  40mg  PO daily. Advised on lifestyle modifications.   RTC 6 months.   I personally spent a total of 30 minutes in the care of the patient today including preparing to see the patient, getting/reviewing separately obtained history, performing a medically appropriate exam/evaluation, counseling and educating, documenting clinical information in the EHR, independently interpreting results, and communicating results.   Darrin Barn, MD  Pulmonary Critical Care 08/24/2024 1:49 PM   "

## 2024-08-28 ENCOUNTER — Encounter: Payer: Self-pay | Admitting: Family Medicine

## 2024-08-28 DIAGNOSIS — I10 Essential (primary) hypertension: Secondary | ICD-10-CM

## 2024-08-29 MED ORDER — LOSARTAN POTASSIUM 25 MG PO TABS: 25.0000 mg | ORAL_TABLET | Freq: Every day | ORAL | 0 refills | Status: AC

## 2024-09-10 ENCOUNTER — Ambulatory Visit: Admitting: Dermatology

## 2024-09-12 ENCOUNTER — Ambulatory Visit: Admitting: Dermatology

## 2024-10-25 ENCOUNTER — Ambulatory Visit: Admitting: Radiation Oncology

## 2025-01-29 ENCOUNTER — Inpatient Hospital Stay: Admitting: Oncology

## 2025-05-14 ENCOUNTER — Encounter: Admitting: Dermatology
